# Patient Record
Sex: Female | Born: 1935 | Race: White | Hispanic: No | Smoking: Former smoker
Health system: Southern US, Community
[De-identification: ages and names within clinical notes are randomized; demographics above are authoritative.]

## PROBLEM LIST (undated history)

## (undated) DIAGNOSIS — Z923 Personal history of irradiation: Secondary | ICD-10-CM

## (undated) DIAGNOSIS — Z9981 Dependence on supplemental oxygen: Secondary | ICD-10-CM

## (undated) DIAGNOSIS — K219 Gastro-esophageal reflux disease without esophagitis: Secondary | ICD-10-CM

## (undated) DIAGNOSIS — I2510 Atherosclerotic heart disease of native coronary artery without angina pectoris: Secondary | ICD-10-CM

## (undated) DIAGNOSIS — H9190 Unspecified hearing loss, unspecified ear: Secondary | ICD-10-CM

## (undated) DIAGNOSIS — H538 Other visual disturbances: Secondary | ICD-10-CM

## (undated) DIAGNOSIS — J189 Pneumonia, unspecified organism: Secondary | ICD-10-CM

## (undated) DIAGNOSIS — G8929 Other chronic pain: Secondary | ICD-10-CM

## (undated) DIAGNOSIS — R5383 Other fatigue: Secondary | ICD-10-CM

## (undated) DIAGNOSIS — I214 Non-ST elevation (NSTEMI) myocardial infarction: Secondary | ICD-10-CM

## (undated) DIAGNOSIS — M545 Low back pain, unspecified: Secondary | ICD-10-CM

## (undated) DIAGNOSIS — C44321 Squamous cell carcinoma of skin of nose: Secondary | ICD-10-CM

## (undated) DIAGNOSIS — I10 Essential (primary) hypertension: Secondary | ICD-10-CM

## (undated) DIAGNOSIS — M1990 Unspecified osteoarthritis, unspecified site: Secondary | ICD-10-CM

## (undated) DIAGNOSIS — E785 Hyperlipidemia, unspecified: Secondary | ICD-10-CM

## (undated) DIAGNOSIS — J449 Chronic obstructive pulmonary disease, unspecified: Secondary | ICD-10-CM

## (undated) HISTORY — PX: BACK SURGERY: SHX140

## (undated) HISTORY — DX: Gastro-esophageal reflux disease without esophagitis: K21.9

## (undated) HISTORY — PX: ESOPHAGEAL DILATION: SHX303

## (undated) HISTORY — PX: DILATION AND CURETTAGE OF UTERUS: SHX78

## (undated) HISTORY — PX: CORONARY ANGIOPLASTY WITH STENT PLACEMENT: SHX49

## (undated) HISTORY — PX: TUBAL LIGATION: SHX77

## (undated) HISTORY — PX: ABDOMINAL HYSTERECTOMY: SHX81

## (undated) HISTORY — DX: Atherosclerotic heart disease of native coronary artery without angina pectoris: I25.10

## (undated) HISTORY — DX: Hyperlipidemia, unspecified: E78.5

## (undated) HISTORY — DX: Personal history of irradiation: Z92.3

---

## 1999-05-04 ENCOUNTER — Inpatient Hospital Stay (HOSPITAL_COMMUNITY): Admission: EM | Admit: 1999-05-04 | Discharge: 1999-05-08 | Payer: Self-pay | Admitting: Emergency Medicine

## 1999-05-04 ENCOUNTER — Other Ambulatory Visit: Admission: RE | Admit: 1999-05-04 | Discharge: 1999-05-04 | Payer: Self-pay | Admitting: Family Medicine

## 1999-05-04 ENCOUNTER — Encounter: Payer: Self-pay | Admitting: Emergency Medicine

## 1999-06-17 ENCOUNTER — Encounter: Payer: Self-pay | Admitting: Emergency Medicine

## 1999-06-17 ENCOUNTER — Inpatient Hospital Stay (HOSPITAL_COMMUNITY): Admission: EM | Admit: 1999-06-17 | Discharge: 1999-06-19 | Payer: Self-pay

## 2000-07-11 ENCOUNTER — Ambulatory Visit (HOSPITAL_COMMUNITY): Admission: RE | Admit: 2000-07-11 | Discharge: 2000-07-11 | Payer: Self-pay | Admitting: Cardiology

## 2000-08-29 ENCOUNTER — Ambulatory Visit (HOSPITAL_COMMUNITY): Admission: RE | Admit: 2000-08-29 | Discharge: 2000-08-29 | Payer: Self-pay | Admitting: Gastroenterology

## 2000-09-19 ENCOUNTER — Ambulatory Visit (HOSPITAL_COMMUNITY): Admission: RE | Admit: 2000-09-19 | Discharge: 2000-09-19 | Payer: Self-pay | Admitting: Gastroenterology

## 2000-09-19 ENCOUNTER — Encounter (INDEPENDENT_AMBULATORY_CARE_PROVIDER_SITE_OTHER): Payer: Self-pay | Admitting: *Deleted

## 2000-12-09 ENCOUNTER — Ambulatory Visit (HOSPITAL_COMMUNITY): Admission: RE | Admit: 2000-12-09 | Discharge: 2000-12-09 | Payer: Self-pay | Admitting: Cardiology

## 2003-11-02 ENCOUNTER — Emergency Department (HOSPITAL_COMMUNITY): Admission: EM | Admit: 2003-11-02 | Discharge: 2003-11-02 | Payer: Self-pay | Admitting: Emergency Medicine

## 2004-10-10 ENCOUNTER — Ambulatory Visit (HOSPITAL_COMMUNITY): Admission: RE | Admit: 2004-10-10 | Discharge: 2004-10-11 | Payer: Self-pay | Admitting: Cardiology

## 2004-10-13 ENCOUNTER — Inpatient Hospital Stay (HOSPITAL_COMMUNITY): Admission: EM | Admit: 2004-10-13 | Discharge: 2004-10-17 | Payer: Self-pay | Admitting: Emergency Medicine

## 2004-10-27 ENCOUNTER — Emergency Department (HOSPITAL_COMMUNITY): Admission: EM | Admit: 2004-10-27 | Discharge: 2004-10-27 | Payer: Self-pay | Admitting: Emergency Medicine

## 2004-10-31 ENCOUNTER — Encounter: Admission: RE | Admit: 2004-10-31 | Discharge: 2004-10-31 | Payer: Self-pay | Admitting: Cardiology

## 2005-01-01 ENCOUNTER — Ambulatory Visit (HOSPITAL_COMMUNITY): Admission: RE | Admit: 2005-01-01 | Discharge: 2005-01-01 | Payer: Self-pay | Admitting: Gastroenterology

## 2005-01-01 ENCOUNTER — Encounter (INDEPENDENT_AMBULATORY_CARE_PROVIDER_SITE_OTHER): Payer: Self-pay | Admitting: *Deleted

## 2005-02-06 ENCOUNTER — Encounter: Admission: RE | Admit: 2005-02-06 | Discharge: 2005-02-06 | Payer: Self-pay | Admitting: Gastroenterology

## 2005-03-28 ENCOUNTER — Inpatient Hospital Stay (HOSPITAL_COMMUNITY): Admission: EM | Admit: 2005-03-28 | Discharge: 2005-03-29 | Payer: Self-pay | Admitting: Emergency Medicine

## 2007-11-22 ENCOUNTER — Emergency Department (HOSPITAL_COMMUNITY): Admission: EM | Admit: 2007-11-22 | Discharge: 2007-11-22 | Payer: Self-pay | Admitting: Emergency Medicine

## 2007-11-24 ENCOUNTER — Inpatient Hospital Stay (HOSPITAL_COMMUNITY): Admission: EM | Admit: 2007-11-24 | Discharge: 2007-11-29 | Payer: Self-pay | Admitting: Emergency Medicine

## 2009-09-21 ENCOUNTER — Encounter: Admission: RE | Admit: 2009-09-21 | Discharge: 2009-09-21 | Payer: Self-pay | Admitting: Family Medicine

## 2010-05-24 ENCOUNTER — Ambulatory Visit: Payer: Self-pay | Admitting: Vascular Surgery

## 2010-08-15 ENCOUNTER — Encounter: Payer: Self-pay | Admitting: Gastroenterology

## 2010-08-16 ENCOUNTER — Encounter: Payer: Self-pay | Admitting: Gastroenterology

## 2010-08-28 ENCOUNTER — Ambulatory Visit: Payer: Self-pay | Admitting: Gastroenterology

## 2010-08-28 DIAGNOSIS — K6289 Other specified diseases of anus and rectum: Secondary | ICD-10-CM

## 2010-08-28 DIAGNOSIS — R110 Nausea: Secondary | ICD-10-CM

## 2010-08-28 DIAGNOSIS — I2510 Atherosclerotic heart disease of native coronary artery without angina pectoris: Secondary | ICD-10-CM

## 2010-08-30 ENCOUNTER — Telehealth: Payer: Self-pay | Admitting: Gastroenterology

## 2010-09-02 ENCOUNTER — Telehealth: Payer: Self-pay | Admitting: Internal Medicine

## 2010-09-04 ENCOUNTER — Telehealth: Payer: Self-pay | Admitting: Gastroenterology

## 2010-09-04 ENCOUNTER — Ambulatory Visit (HOSPITAL_COMMUNITY)
Admission: RE | Admit: 2010-09-04 | Discharge: 2010-09-04 | Payer: Self-pay | Source: Home / Self Care | Admitting: Gastroenterology

## 2010-09-05 ENCOUNTER — Ambulatory Visit: Payer: Self-pay | Admitting: Gastroenterology

## 2010-09-05 LAB — CONVERTED CEMR LAB
ALT: 14 units/L (ref 0–35)
AST: 16 units/L (ref 0–37)
Albumin: 4.2 g/dL (ref 3.5–5.2)
Alkaline Phosphatase: 48 units/L (ref 39–117)
BUN: 20 mg/dL (ref 6–23)
Basophils Absolute: 0.1 10*3/uL (ref 0.0–0.1)
Basophils Relative: 0.3 % (ref 0.0–3.0)
CO2: 31 meq/L (ref 19–32)
Calcium: 10.2 mg/dL (ref 8.4–10.5)
Chloride: 102 meq/L (ref 96–112)
Creatinine, Ser: 1.2 mg/dL (ref 0.4–1.2)
Eosinophils Absolute: 0 10*3/uL (ref 0.0–0.7)
Eosinophils Relative: 0.2 % (ref 0.0–5.0)
GFR calc non Af Amer: 45.83 mL/min (ref >60)
Glucose, Bld: 89 mg/dL (ref 70–99)
HCT: 43.5 % (ref 36.0–46.0)
Hemoglobin: 15.1 g/dL — ABNORMAL HIGH (ref 12.0–15.0)
INR: 1 (ref 0.8–1.0)
Lymphocytes Relative: 15.4 % (ref 12.0–46.0)
Lymphs Abs: 2.6 10*3/uL (ref 0.7–4.0)
MCHC: 34.8 g/dL (ref 30.0–36.0)
MCV: 94.2 fL (ref 78.0–100.0)
Monocytes Absolute: 1.4 10*3/uL — ABNORMAL HIGH (ref 0.1–1.0)
Monocytes Relative: 8.1 % (ref 3.0–12.0)
Neutro Abs: 12.9 10*3/uL — ABNORMAL HIGH (ref 1.4–7.7)
Neutrophils Relative %: 76 % (ref 43.0–77.0)
Platelets: 230 10*3/uL (ref 150.0–400.0)
Potassium: 3.8 meq/L (ref 3.5–5.1)
Prothrombin Time: 10.8 s (ref 9.7–11.8)
RBC: 4.61 M/uL (ref 3.87–5.11)
RDW: 13.2 % (ref 11.5–14.6)
Sodium: 142 meq/L (ref 135–145)
Total Bilirubin: 0.6 mg/dL (ref 0.3–1.2)
Total Protein: 6.7 g/dL (ref 6.0–8.3)
WBC: 17 10*3/uL — ABNORMAL HIGH (ref 4.5–10.5)

## 2010-09-07 ENCOUNTER — Encounter: Payer: Self-pay | Admitting: Gastroenterology

## 2010-09-14 ENCOUNTER — Telehealth: Payer: Self-pay | Admitting: Gastroenterology

## 2010-10-02 ENCOUNTER — Ambulatory Visit (HOSPITAL_COMMUNITY): Admission: RE | Admit: 2010-10-02 | Discharge: 2010-10-02 | Payer: Self-pay | Admitting: Family Medicine

## 2010-10-06 ENCOUNTER — Ambulatory Visit: Payer: Self-pay | Admitting: Cardiovascular Disease

## 2010-10-06 ENCOUNTER — Inpatient Hospital Stay (HOSPITAL_COMMUNITY)
Admission: EM | Admit: 2010-10-06 | Discharge: 2010-10-09 | Payer: Self-pay | Source: Home / Self Care | Admitting: Emergency Medicine

## 2010-10-06 ENCOUNTER — Emergency Department (HOSPITAL_COMMUNITY): Admission: EM | Admit: 2010-10-06 | Discharge: 2010-10-06 | Payer: Self-pay | Admitting: Family Medicine

## 2010-10-19 ENCOUNTER — Ambulatory Visit: Payer: Self-pay | Admitting: Gastroenterology

## 2010-12-09 ENCOUNTER — Encounter: Payer: Self-pay | Admitting: Gastroenterology

## 2010-12-16 LAB — CONVERTED CEMR LAB
BUN: 17 mg/dL (ref 6–23)
Creatinine, Ser: 1.4 mg/dL — ABNORMAL HIGH (ref 0.4–1.2)

## 2010-12-20 NOTE — Assessment & Plan Note (Signed)
Summary: PROCEDURE F/U.Marland KitchenMarland KitchenAS.   History of Present Illness Visit Type: Follow-up Visit Primary GI MD: Melvia Heaps MD [facility] Primary Monzerrat Wellen: Marylin Crosby, MD Chief Complaint: Procedure follow up, pt complains of abdominal discomfort and diarrhea History of Present Illness:   Ms. Lovins has returned following colonoscopy.  This did not demonstrate any abnormalities.  She was prescribed clinoril but did not take this because of GI upset.  She continues to complain of lower back pain with radiation to the hips.  She also has rectal pain and is not affected by moving her bowels.  She received a steroid injection into her back without improvement.   GI Review of Systems    Reports abdominal pain and  bloating.     Location of  Abdominal pain: lower abdomen.    Denies acid reflux, belching, chest pain, dysphagia with liquids, dysphagia with solids, heartburn, loss of appetite, nausea, vomiting, vomiting blood, weight loss, and  weight gain.      Reports diarrhea and  rectal pain.     Denies anal fissure, black tarry stools, change in bowel habit, constipation, diverticulosis, fecal incontinence, heme positive stool, hemorrhoids, irritable bowel syndrome, jaundice, light color stool, liver problems, and  rectal bleeding.    Current Medications (verified): 1)  Calcium 600 600 Mg Tabs (Calcium Carbonate) .... Take 1 Tablet By Mouth Once A Day 2)  Bayer Low Strength 81 Mg Tbec (Aspirin) .... Take 1 Tablet By Mouth Once A Day 3)  Zantac 300 Mg Tabs (Ranitidine Hcl) .... Take 1 Tablet By Mouth Once A Day 4)  Pravachol 40 Mg Tabs (Pravastatin Sodium) .... Take 1 Tablet By Mouth Once A Day 5)  Hydrochlorothiazide 25 Mg Tabs (Hydrochlorothiazide) .... Take 1 Tablet By Mouth Once A Day 6)  Atenolol 50 Mg Tabs (Atenolol) .... Take 1/2 Tablet By Mouth Once Daily 7)  Isosorbide Dinitrate 20 Mg Tabs (Isosorbide Dinitrate) .... Take 2 Tablets By Mouth Once Daily 8)  Vitamin D 1000 Unit Tabs  (Cholecalciferol) .Marland Kitchen.. 1 By Mouth Once Daily 9)  Tramadol Hcl 50 Mg Tabs (Tramadol Hcl) .... As Needed 10)  Proventil Hfa 108 (90 Base) Mcg/act Aers (Albuterol Sulfate) .... 2 Puffs Every 4 Hours 11)  Advair Diskus 250-50 Mcg/dose Aepb (Fluticasone-Salmeterol) .... Two Times A Day As Needed  Allergies (verified): 1)  ! * Ivp Dye  Past History:  Past Medical History: Last updated: 08/28/2010 Hypertension Hyperlipidemia GERD Rectal Pain CAD  Past Surgical History: Last updated: 08/28/2010 Cardiac Stent Placement Hysterectomy  Family History: Last updated: 08/28/2010 No FH of Colon Cancer: Family History of Heart Disease: Father  Social History: Last updated: 08/28/2010 Patient currently smokes. -smokes 9 cigarettes daily Alcohol Use - no Daily Caffeine Use-2 cups daily Illicit Drug Use - no Patient does not get regular exercise.   Review of Systems       The patient complains of fatigue.  The patient denies allergy/sinus, anemia, anxiety-new, arthritis/joint pain, back pain, blood in urine, breast changes/lumps, change in vision, confusion, cough, coughing up blood, depression-new, fainting, fever, headaches-new, hearing problems, heart murmur, heart rhythm changes, itching, menstrual pain, muscle pains/cramps, night sweats, nosebleeds, pregnancy symptoms, shortness of breath, skin rash, sleeping problems, sore throat, swelling of feet/legs, swollen lymph glands, thirst - excessive , urination - excessive , urination changes/pain, urine leakage, vision changes, and voice change.    Vital Signs:  Patient profile:   76 year old female Height:      65 inches Weight:  164 pounds BMI:     27.39 BSA:     1.82 Pulse rate:   88 / minute Pulse rhythm:   regular BP sitting:   126 / 64  (left arm)  Vitals Entered By: Merri Ray CMA Duncan Dull) (October 19, 2010 2:09 PM)   Impression & Recommendations:  Problem # 1:  ANAL OR RECTAL PAIN (WGN-562.13) I still suspect  that her pain is primarily due to nerve root pain.  Perirectal disease is less likely.  Recommendations #1 trial of Celebrex 100 mg a day( patient did not tolerate pleural) #2 Anusol HC suppositories  Patient Instructions: 1)  Copy sent to : Marylin Crosby, MD 2)  Your medications have been sent to your pharmacy 3)  The medication list was reviewed and reconciled.  All changed / newly prescribed medications were explained.  A complete medication list was provided to the patient / caregiver. Prescriptions: ANUSOL-HC 25 MG  SUPP (HYDROCORTISONE ACETATE) take one suppository q.h.s.  #10 x 1   Entered and Authorized by:   Louis Meckel MD   Signed by:   Louis Meckel MD on 10/19/2010   Method used:   Electronically to        [facility] (retail)       [address]       [place], [place]  [address]       Ph: [phone]       Fax: [phone]   RxID:   [id number] CELEBREX 100 MG CAPS (CELECOXIB) date one tab daily for 2 weeks then as needed  #20 x 1   Entered and Authorized by:   Louis Meckel MD   Signed by:   Louis Meckel MD on 10/19/2010   Method used:   Electronically to        [facility] (retail)       [address]       [place], [place]  [address]       Ph: [phone]       Fax: [phone]   RxID:   [id number]

## 2010-12-20 NOTE — Progress Notes (Signed)
Summary: Triage  Phone Note Call from Patient Call back at Home Phone [phone]   Caller: Patient Call For: Dr. Arlyce Dice Reason for Call: Talk to Nurse Summary of Call: Since her COL she has had a headache. Took Tylenol and nothing is helping Initial call taken by: Karna Christmas,  September 14, 2010 11:34 AM  Follow-up for Phone Call        Patient  is advised she should see her priamry care for her HA.  Unlikely to be related to a colonoscopy 09/05/10 Follow-up by: Darcey Nora RN, CGRN,  September 14, 2010 1:21 PM

## 2010-12-20 NOTE — Procedures (Signed)
Summary: Colonoscopy  Patient: [name] Note: All result statuses are Final unless otherwise noted.  Tests: (1) Colonoscopy (COL)   COL Colonoscopy           DONE     [facility]     [address]     [place], [place]  [address]           COLONOSCOPY PROCEDURE REPORT           PATIENT:  [name], [name]  MR#:  [id number]     BIRTHDATE:  11-30-1934, 75 yrs. old  GENDER:  female           ENDOSCOPIST:  Barbette Hair. Arlyce Dice, MD     Referred by:           PROCEDURE DATE:  09/05/2010     PROCEDURE:  Colonoscopy with biopsy     ASA CLASS:  Class II     INDICATIONS:  1) other Severe rectal pain           MEDICATIONS:   Fentanyl 75 mcg IV, Versed 7 mg IV           DESCRIPTION OF PROCEDURE:   After the risks benefits and     alternatives of the procedure were thoroughly explained, informed     consent was obtained.  Digital rectal exam was performed and     revealed no abnormalities.   The LB [id number] endoscope     was introduced through the anus and advanced to the cecum, which     was identified by both the appendix and ileocecal valve, without     limitations.  The quality of the prep was good, using MiraLax.     The instrument was then slowly withdrawn as the colon was fully     examined.     <<PROCEDUREIMAGES>>           FINDINGS:  Colitis was found. Multiple areas of submucosal     erythema extending from proximal sigmoid to descending colon. Bxs     taken (see image10, image13, and image7).  Moderate diverticulosis     was found in the sigmoid colon (see image15 and image14).  This     was otherwise a normal examination of the colon (see image3,     image6, image11, and image16).   Retroflexed views in the rectum     revealed no abnormalities.    The time to cecum =  4.75  minutes.     The scope was then withdrawn (time =  6.75  min) from the patient     and the procedure completed.           COMPLICATIONS:  None           ENDOSCOPIC IMPRESSION:     1)  Colitis     2) Moderate diverticulosis in the sigmoid colon     3) Otherwise normal examination     RECOMMENDATIONS:     1) Await biopsy results     2) call office next 1-3 days to schedule followup visit in 2     weeks           REPEAT EXAM:  No           ______________________________     Barbette Hair. Arlyce Dice, MD           CC: Windle Guard, MD           n.  eSIGNED:   Barbette Hair. [place] at 09/05/2010 09:16 AM           Lucilla Edin, [id number]  Note: An exclamation mark (!) indicates a result that was not dispersed into the flowsheet. Document Creation Date: 09/05/2010 9:16 AM _______________________________________________________________________  (1) Order result status: Final Collection or observation date-time: 09/05/2010 09:05 Requested date-time:  Receipt date-time:  Reported date-time:  Referring Physician:   Ordering Physician: Melvia Heaps [phone]) Specimen Source:  Source: Launa Grill Order Number: [id number] Lab site:

## 2010-12-20 NOTE — Letter (Signed)
Summary: [facility] Instructions  [facility]  [address] [place], [place] [address]   Phone: [phone]  Fax: [phone]       [name]    [birth year]    MRN: [id number]        Procedure Day /Date:WEDNESDAY 09/05/2010     Arrival Time:7:30AM     Procedure Time:8:30AM     Location of Procedure:                    X   [facility] (4th Floor)                        PREPARATION FOR COLONOSCOPY WITH MOVIPREP   Starting 5 days prior to your procedure 08/31/2010 do not eat nuts, seeds, popcorn, corn, beans, peas,  salads, or any raw vegetables.  Do not take any fiber supplements (e.g. Metamucil, Citrucel, and Benefiber).  THE DAY BEFORE YOUR PROCEDURE         DATE: 09/04/2010  DAY: TUESDAY  1.  Drink clear liquids the entire day-NO SOLID FOOD  2.  Do not drink anything colored red or purple.  Avoid juices with pulp.  No orange juice.  3.  Drink at least 64 oz. (8 glasses) of fluid/clear liquids during the day to prevent dehydration and help the prep work efficiently.  CLEAR LIQUIDS INCLUDE: Water Jello Ice Popsicles Tea (sugar ok, no milk/cream) Powdered fruit flavored drinks Coffee (sugar ok, no milk/cream) Gatorade Juice: apple, white grape, white cranberry  Lemonade Clear bullion, consomm, broth Carbonated beverages (any kind) Strained chicken noodle soup Hard Candy                             4.  In the morning, mix first dose of MoviPrep solution:    Empty 1 Pouch A and 1 Pouch B into the disposable container    Add lukewarm drinking water to the top line of the container. Mix to dissolve    Refrigerate (mixed solution should be used within 24 hrs)  5.  Begin drinking the prep at 5:00 p.m. The MoviPrep container is divided by 4 marks.   Every 15 minutes drink the solution down to the next mark (approximately 8 oz) until the full liter is complete.   6.  Follow completed prep with 16 oz of clear liquid of your choice  (Nothing red or purple).  Continue to drink clear liquids until bedtime.  7.  Before going to bed, mix second dose of MoviPrep solution:    Empty 1 Pouch A and 1 Pouch B into the disposable container    Add lukewarm drinking water to the top line of the container. Mix to dissolve    Refrigerate  THE DAY OF YOUR PROCEDURE      DATE: 09/05/2010 DAY: WEDNESDAY  Beginning at3:30a.m. (5 hours before procedure):         1. Every 15 minutes, drink the solution down to the next mark (approx 8 oz) until the full liter is complete.  2. Follow completed prep with 16 oz. of clear liquid of your choice.    3. You may drink clear liquids until 6:30AM (2 HOURS BEFORE PROCEDURE).   MEDICATION INSTRUCTIONS  Unless otherwise instructed, you should take regular prescription medications with a small sip of water   as early as possible the morning of your procedure.  OTHER INSTRUCTIONS  You will need a responsible adult at least 74 years of age to accompany you and drive you home.   This person must remain in the waiting room during your procedure.  Wear loose fitting clothing that is easily removed.  Leave jewelry and other valuables at home.  However, you may wish to bring a book to read or  an iPod/MP3 player to listen to music as you wait for your procedure to start.  Remove all body piercing jewelry and leave at home.  Total time from sign-in until discharge is approximately 2-3 hours.  You should go home directly after your procedure and rest.  You can resume normal activities the  day after your procedure.  The day of your procedure you should not:   Drive   Make legal decisions   Operate machinery   Drink alcohol   Return to work  You will receive specific instructions about eating, activities and medications before you leave.    The above instructions have been reviewed and explained to me by   _______________________    I fully understand and can  verbalize these instructions _____________________________ Date _________

## 2010-12-20 NOTE — Progress Notes (Signed)
  Phone Note Call from Patient   Caller: patient and daughter Call For: Dr Barnie Alderman "makes her sick", unablw to take. Summary of Call: Pt says she is not able to take Clinoril ( Sulindac), because it makes her sick. I advised her to discontinue the medication.

## 2010-12-20 NOTE — Letter (Signed)
Summary: Results Letter  [facility]  [address] [place], [place] [address]   Phone: [phone]  Fax: [phone]        August 28, 2010 MRN: [id number]    [name] [address] [place], [place]  [address]    Dear [name],  It is my pleasure to have treated you recently as a new patient in my office. I appreciate your confidence and the opportunity to participate in your care.  Since I do have a busy inpatient endoscopy schedule and office schedule, my office hours vary weekly. I am, however, available for emergency calls everyday through my office. If I am not available for an urgent office appointment, another one of our gastroenterologist will be able to assist you.  My well-trained staff are prepared to help you at all times. For emergencies after office hours, a physician from our Gastroenterology section is always available through my 24 hour answering service  Once again I welcome you as a new patient and I look forward to a happy and healthy relationship             Sincerely,  Louis Meckel MD  This letter has been electronically signed by your physician.  Appended Document: Results Letter LETTER MAILED

## 2010-12-20 NOTE — Progress Notes (Signed)
Summary: Schedule labs before colonoscopy  ---- Converted from flag ---- ---- 09/04/2010 2:32 PM, Louis Meckel MD wrote: [name] patiet comes i for her colooscopy she should have a CBC, comprehesive metaolic profile ad INR FOR POSSIBLE ABNORMAL CONTOUR OF THE LIVER SEEN ON CT sca ------------------------------  Phone Note Outgoing Call Call back at [facility] Phone [phone]   Call placed by: Chales Abrahams CMA Duncan Dull),  September 04, 2010 3:03 PM Summary of Call: call placed to the pt and she will arrive at 730 for labs before her colon. Initial call taken by: Chales Abrahams CMA Duncan Dull),  September 04, 2010 3:03 PM     Appended Document: Schedule labs before colonoscopy ok  ---- 09/04/2010 3:04 PM, Merri Ray CMA (AAMA) wrote: Her appointment is at 7:30 to arrive for her colonoscopy...she will go to the lab first

## 2010-12-20 NOTE — Assessment & Plan Note (Signed)
Summary: rectal pain with BM...as.   History of Present Illness Visit Type: Initial Consult Primary GI MD: Melvia Heaps MD [facility] Primary Provider: Marylin Crosby, MD Chief Complaint: Patient c/o severe rectal pain as well as constipation. She has also began having nausea today which she believes is a result of her pain. She also describes lower abdominal cramping and abdominal bloating. Also c/o fever. History of Present Illness:   [name] is a 76 year old white female referred at the request of Dr. Jeannetta Nap for evaluation of rectal pain.  She initially developed severe pain in her lower back radiating to her right lower extremity and hip.  This was concurrent with rectal pain.  She received some injections into her back for pain.  This has subsided to a great degree.  She continues to have severe rectal pain.  It tends to worsen with prolonged standing.  It is not clearly exacerbated by  bowel movements.  She has had no rectal bleeding or change in bowel habits.  She complains of mild constipation.  She has also had some discomfort in the vagina.  She has had no vaginal discharge.  She denies fever.  The patient also complains of mild nausea postprandially but she attributes to her pain.  She has been taking, dull pain.   GI Review of Systems    Reports abdominal pain, acid reflux, bloating, chest pain, loss of appetite, and  nausea.     Location of  Abdominal pain: lower abdomen.    Denies belching, dysphagia with liquids, dysphagia with solids, heartburn, vomiting, vomiting blood, weight loss, and  weight gain.      Reports constipation and  rectal pain.    Preventive Screening-Counseling & Management  Alcohol-Tobacco     Smoking Status: current  Caffeine-Diet-Exercise     Does Patient Exercise: no      Drug Use:  no.      Current Medications (verified): 1)  Calcium 600 600 Mg Tabs (Calcium Carbonate) .... Take 1 Tablet By Mouth Once A Day 2)  Bayer Low Strength 81 Mg Tbec  (Aspirin) .... Take 1 Tablet By Mouth Once A Day 3)  Zantac 300 Mg Tabs (Ranitidine Hcl) .... Take 1 Tablet By Mouth Once A Day 4)  Pravachol 40 Mg Tabs (Pravastatin Sodium) .... Take 1 Tablet By Mouth Once A Day 5)  Hydrochlorothiazide 25 Mg Tabs (Hydrochlorothiazide) .... Take 1 Tablet By Mouth Once A Day 6)  Atenolol 50 Mg Tabs (Atenolol) .... Take 1/2 Tablet By Mouth Once Daily 7)  Isosorbide Dinitrate 20 Mg Tabs (Isosorbide Dinitrate) .... Take 2 Tablets By Mouth Once Daily 8)  Tramadol Hcl 50 Mg Tabs (Tramadol Hcl) .... Take 1-2 Tablet By Mouth Every 4 Hours As Needed  Allergies (verified): 1)  ! * Ivp Dye  Past History:  Past Medical History: Hypertension Hyperlipidemia GERD Rectal Pain CAD  Past Surgical History: Cardiac Stent Placement Hysterectomy  Family History: No FH of Colon Cancer: Family History of Heart Disease: Father  Social History: Patient currently smokes. -smokes 9 cigarettes daily Alcohol Use - no Daily Caffeine Use-2 cups daily Illicit Drug Use - no Patient does not get regular exercise.  Smoking Status:  current Drug Use:  no Does Patient Exercise:  no  Review of Systems       The patient complains of fever, shortness of breath, and sleeping problems.  The patient denies allergy/sinus, anemia, anxiety-new, arthritis/joint pain, back pain, blood in urine, breast changes/lumps, change in vision, confusion, cough, coughing  up blood, depression-new, fainting, fatigue, headaches-new, hearing problems, heart murmur, heart rhythm changes, itching, menstrual pain, muscle pains/cramps, night sweats, nosebleeds, pregnancy symptoms, skin rash, sore throat, swelling of feet/legs, swollen lymph glands, thirst - excessive , urination - excessive , urination changes/pain, urine leakage, vision changes, and voice change.         All other systems were reviewed and were negative   Vital Signs:  Patient profile:   76 year old female Height:      65  inches Weight:      166.25 pounds BMI:     27.77 BSA:     1.83 Temp:     97.6 degrees F oral Pulse rate:   60 / minute Pulse rhythm:   regular BP sitting:   103 / 62  (left arm)  Vitals Entered By: [name] CMA Duncan Dull) (August 28, 2010 1:20 PM)  Physical Exam  Additional Exam:  Onn physical exam she is a well-developed well-nourished female  skin: anicter  HEENT: normocephalic; PEERLA; no nasal or orpharyngeal abnormalities neck: supple nodes: no cervical adenopathy chest: clear cor:  no murmurs, gallops or rubs abd:  bowel sounds normoactive; no abdominal masses, tenderness, organomegaly rectal: no masses; stool heme negative; there is some moderate pain on rectal examination throughout the rectum and anus with no spot tenderness.  There no obvious fissures. ext: no cyanosis, clubbing, or edema skeletal: no gross skeletal abnormalities neuro: alert, oriented x 3; no focal abnormalities    Impression & Recommendations:  Problem # 1:  ANAL OR RECTAL PAIN (ICD-569.42) There is no obvious rectal pathology including abscess, fissure or masses to account for her pain.  Nerve root pain is a consideration, especially in view of her concurrent low back pain with radiation to her hip and right lower extremity.  She apparently has vaginal pain as well.  A space-occupying lesion in the pelvis is another consideration.  Recommendations #1 colonoscopy #2 CT of the abdomen and pelvis #3 trial of clinical 200 mg twice a day  Problem # 2:  NAUSEA ALONE (ICD-787.02) This could be related to tramadol  Recommendations #1 discontinue tramadol and substitute Clinoril  Problem # 3:  CAD (ICD-414.00)  Other Orders: Colonoscopy (Colon) CT Abdomen/Pelvis with Contrast (CT Abd/Pelvis w/con) TLB-BUN (Urea Nitrogen) (84520-BUN) TLB-Creatinine, Blood (82565-CREA)  Patient Instructions: 1)  Copy sent to : Marylin Crosby, MD 2)  D/C Tramadol 3)  We are sending you in a new  prescription 4)  You CT scan is scheduled at [facility] on 08/30/2010 to arrive at 2:15pm 5)  You need to go to [facility] department today to pick up your allergy prescription for your CT scan also your contrast. 6)  Your colonoscopy is scheduled for 09/05/2010 at 8:30am 7)  The medication list was reviewed and reconciled.  All changed / newly prescribed medications were explained.  A complete medication list was provided to the patient / caregiver. Prescriptions: MOVIPREP 100 GM  SOLR (PEG-KCL-NACL-NASULF-NA ASC-C) As per prep instructions.  #1 x 0   Entered by:   Merri Ray CMA (AAMA)   Authorized by:   Louis Meckel MD   Signed by:   Merri Ray CMA (AAMA) on 08/28/2010   Method used:   Electronically to        [facility] (retail)       [address]       [place], [place]  [address]  Ph: [phone]       Fax: [phone]   RxID:   [id number] CLINORIL 200 MG TABS (SULINDAC) take one tablet with meals twice a day  #30 x 2   Entered and Authorized by:   Louis Meckel MD   Signed by:   Merri Ray CMA (AAMA) on 08/28/2010   Method used:   Electronically to        [facility] (retail)       [address]       [place], [place]  [address]       Ph: [phone]       Fax: [phone]   RxID:   [id number]   Appended Document: rectal pain with BM...as. Please inform pt that there are no abnormalities on the CT scan to explain her rectal pain. (CT is negative). Will need CMET, INR, CBC at next OV (questionable contour abnormalities of liver)  Appended Document: rectal pain with BM...as. Will be done before colonoscopy procedure

## 2010-12-20 NOTE — Op Note (Signed)
Summary: EGD/COLON  [name], MCWETHY NO.:  [id number]   MEDICAL RECORD NO.:  [id number]          PATIENT TYPE:  AMB   LOCATION:  ENDO                         FACILITY:  [facility]   PHYSICIAN:  Danise Edge, M.D.   DATE OF BIRTH:  Apr 06, 1935   DATE OF PROCEDURE:  01/01/2005  DATE OF DISCHARGE:                                 OPERATIVE REPORT   PROCEDURE:  Esophagogastroduodenoscopy, small bowel biopsy, colonoscopy and  biopsy.   REFERRING PHYSICIAN:  Windle Guard, M.D.   HISTORY:  [name] is a 76 year old female born May 02, 1935.  [name] has chronic nonbloody diarrhea which started after taking a  course of methylprednisolone for gout. She has nocturnal diarrhea. She has  upper abdominal discomfort, but denies hematochezia.   A December 12, 2004 stool culture negative. Stool screen for C. Difficile  toxin negative. Stool screen for ova and parasites negative.   Dr. Windle Guard has carefully reviewed her chronic medications and despite  eliminating medications, her diarrhea persisted.   MEDICATION ALLERGIES:  None.   CHRONIC MEDICATIONS:  Bayer aspirin, Protonix,  ticlopidine Crestor,  hydrochlorothiazide atenolol, isosorbide.   PAST MEDICAL HISTORY:  Hysterectomy, coronary artery disease, hypertension,  hypercholesterolemia, gastroesophageal reflux, colonoscopy 4 years ago  performed by Dr. Ritta Slot.   ENDOSCOPIST:  Danise Edge, M.D.   PREMEDICATION:  Versed 5 mg, Demerol 50 mg.   PROCEDURE:  ESOPHAGOGASTRODUODENOSCOPY WITH SMALL BOWEL BIOPSIES:  After  obtaining informed consent, [name] was placed in the left lateral  decubitus position. I administered intravenous Demerol and intravenous  Versed to achieve conscious sedation for the procedure. The patient's blood  pressure, oxygen saturation and cardiac rhythm were monitored throughout the  procedure and documented in the medical record.   The Olympus gastroscope  was passed through the posterior hypopharynx at the  proximal esophagus without difficulty. The hypopharynx, larynx and vocal  cords appeared normal.   ESOPHAGOSCOPY: The proximal, mid, and lower segments of the esophageal  mucosa appeared normal. The squamocolumnar junction and esophagogastric  junction are noted at 40 cm from the incisor teeth.   GASTROSCOPY:  Retroflex view of the gastric cardia and fundus was normal.  The gastric body, antrum and pylorus appeared normal.   DUODENOSCOPY: The duodenal bulb, mid duodenum and distal duodenum appeared  normal.   BIOPSIES: Six biopsies were taken from the second - third portions of the  duodenum to look for celiac sprue.   ASSESSMENT:  Normal esophagogastroduodenoscopy. Small bowel biopsies, rule  out celiac sprue pending.   PROCEDURE:  DIAGNOSTIC COLONOSCOPY WITH COLONIC BIOPSIES.  Anal inspection  and digital rectal exam were normal. The Olympus adjustable pediatric  colonoscope was introduced into the rectum and advanced to the cecum.  Colonic preparation exam today was satisfactory.   RECTUM:  Normal.  SIGMOID COLON AND DESCENDING COLON:  Colonic diverticulosis.  SPLENIC FLEXURE:  Normal.  TRANSVERSE COLON:  Normal.  HEPATIC FLEXURE:  Normal.  ASCENDING COLON:  Normal.  CECUM AND ILEOCECAL VALVE:  Normal.   BIOPSIES:  Three biopsies were taken from the right  colon and two biopsies  were taken from the left colon to look for lymphocytic - collagenous  colitis.   ASSESSMENT:  Left colonic diverticulosis; otherwise normal proctocolonoscopy  to the cecum. Colonic biopsies, rule out lymphocytic - collagenous colitis  pending.      MJ/MEDQ  D:  01/01/2005  T:  01/01/2005  Job:  [id number]   cc:   Windle Guard, M.D.  [address]  [place], [place] [address]  Fax: [phone]

## 2010-12-20 NOTE — Progress Notes (Signed)
Summary: How much benadril to take?  Phone Note Call from Patient Call back at Home Phone [phone]   Call For: Dr Arlyce Dice Summary of Call: Unsure of the ammount of benadrill she needs to take before her CScan. They just gave her a box without any instructions Initial call taken by: Leanor Kail [facility],  August 30, 2010 9:23 AM  Follow-up for Phone Call        Pt did not start her prednisone prep 13 hours prior had to r/s her CT for 09/04/2010 at 12:45.Marland KitchenMarland KitchenPt is to contact technologist at [phone] for specific instructions Follow-up by: Merri Ray CMA Duncan Dull),  August 30, 2010 9:39 AM  Additional Follow-up for Phone Call Additional follow up Details #1::        Contacted pt she is aware that she needs to contact tech 1 day ahead of time for specific prep instructions on her prednisone and benadryl Additional Follow-up by: Merri Ray CMA Duncan Dull),  August 30, 2010 9:42 AM

## 2010-12-20 NOTE — Op Note (Signed)
Summary: COLONOSCOPY (MEDOFF)    [facility]  Patient:    [name], [name]                    MRN: [id number] Proc. Date: 09/19/00 Adm. Date:  [id number] Attending:  Deneen Harts CC:         Dr. Berline Chough   Procedure Report  PROCEDURE:  Colonoscopy.  INDICATIONS:  A 76 year old white female with complaints of abdominal pain, pressure-type discomfort in the mid abdomen, associated with altered bowel movements with thinning stool caliber, tenesmus, and increased straining. Denies hematochezia.  No prior colorectal neoplasia surveillance.  The patient underwent upper endoscopy on August 29, 2000, for similar complaints without abnormality noted.  DESCRIPTION OF PROCEDURE:  After reviewing the nature of the procedure with the patient, including potential risks and complications, and after discussing alternative methods of diagnosis and treatment, informed consent was signed.  The patient was premedicated, receiving IV sedation administered in divided doses totalling Versed 7.5 mg and fentanyl 75 mcg.  Using an Olympus pediatric PCF-140L colonoscope, the rectum was intubated after normal digital examination.  The scope could be advanced to the splenic flexure, but no beyond due to tortuosity of the colon.  After multiple attempts, including rotating the patient into a supine and right lateral decubitus position, it was elected to exchange the colonoscope for a standard size.  Reintubation was easily accomplished and the scope much more easily advanced around the entire length of the colon to the cecum.  This was identified by the appendiceal orifice and ileocecal valve.  Preparation was fair throughout. Copious irrigation required due to Visicol residue.  Excellent visualization with irrigation revealed no abnormality, except for mild sigmoid diverticulosis.  There was no evidence of neoplasia, mucosal inflammation, or vascular abnormality.  No lesion to explain  symptoms of abdominal pain or altered bowel movements.  The retroflexed view did not reveal any anorectal disease, specifically without hemorrhoids or fissure. The colon was then decompressed and scope withdrawn.  The patient tolerated the procedure well without difficulty, being maintained on Datascope monitor and low-flow oxygen throughout.  TIME:  Three.  TECHNICAL:  Two.  PREPARATION:  Two.  TOTAL SCORE:  Seven.  ASSESSMENT: 1. Diverticulosis, mild, sigmoid. 2. No colorectal neoplasia. 3. No lesion to explain symptoms of abdominal pain.  RECOMMENDATIONS: 1. Annual Hemoccult test per Dr. Berline Chough. 2. Repeat colonoscopy in 10 years. 3. Return office visit in one month to review symptoms, findings, and discuss    need for further evaluation.  Consider abdominal CT, repeat abdominal    ultrasound, and CCK-HIDA scan. DD:  09/19/00 TD:  09/21/00 Job: [id number]

## 2010-12-20 NOTE — Letter (Signed)
Summary: Results Letter  [facility]  [address] [place], [place] [address]   Phone: [phone]  Fax: [phone]        September 07, 2010 MRN: [id number]    [name] [address] [place], [place]  [address]    Dear Ms. Mersereau,\  Your colon biopsy results did not show any remarkable findings.  Please continue with the recommendations previously discussed.  Should you have any further questions or immediate concers, feel free to contact me.  Sincerely,  Barbette Hair. Arlyce Dice, M.D., [facility]          Sincerely,  Louis Meckel MD  This letter has been electronically signed by your physician.  Appended Document: Results Letter letter mailed

## 2011-01-29 LAB — COMPREHENSIVE METABOLIC PANEL
ALT: 14 U/L (ref 0–35)
ALT: 15 U/L (ref 0–35)
Albumin: 4.1 g/dL (ref 3.5–5.2)
Alkaline Phosphatase: 47 U/L (ref 39–117)
Alkaline Phosphatase: 47 U/L (ref 39–117)
CO2: 30 mEq/L (ref 19–32)
Calcium: 9.4 mg/dL (ref 8.4–10.5)
Chloride: 102 mEq/L (ref 96–112)
GFR calc non Af Amer: 46 mL/min — ABNORMAL LOW (ref >60)
Glucose, Bld: 103 mg/dL — ABNORMAL HIGH (ref 70–99)
Glucose, Bld: 97 mg/dL (ref 70–99)
Potassium: 3.8 mEq/L (ref 3.5–5.1)
Potassium: 3.9 mEq/L (ref 3.5–5.1)
Sodium: 137 mEq/L (ref 135–145)
Sodium: 138 mEq/L (ref 135–145)
Total Bilirubin: 0.5 mg/dL (ref 0.3–1.2)
Total Protein: 6.9 g/dL (ref 6.0–8.3)

## 2011-01-29 LAB — POCT URINALYSIS DIPSTICK
Glucose, UA: NEGATIVE mg/dL
Ketones, ur: NEGATIVE mg/dL
Protein, ur: NEGATIVE mg/dL

## 2011-01-29 LAB — POCT I-STAT, CHEM 8
BUN: 10 mg/dL (ref 6–23)
Calcium, Ion: 1.14 mmol/L (ref 1.12–1.32)
Creatinine, Ser: 1.2 mg/dL (ref 0.4–1.2)
TCO2: 27 mmol/L (ref 0–100)

## 2011-01-29 LAB — BASIC METABOLIC PANEL
CO2: 28 mEq/L (ref 19–32)
Chloride: 102 mEq/L (ref 96–112)
Chloride: 104 mEq/L (ref 96–112)
GFR calc Af Amer: 57 mL/min — ABNORMAL LOW (ref >60)
GFR calc Af Amer: >60 mL/min (ref >60)
GFR calc non Af Amer: 51 mL/min — ABNORMAL LOW (ref >60)
Potassium: 4.1 mEq/L (ref 3.5–5.1)
Sodium: 139 mEq/L (ref 135–145)
Sodium: 140 mEq/L (ref 135–145)

## 2011-01-29 LAB — CBC
HCT: 43.3 % (ref 36.0–46.0)
HCT: 45.3 % (ref 36.0–46.0)
Hemoglobin: 14.6 g/dL (ref 12.0–15.0)
MCHC: 33.7 g/dL (ref 30.0–36.0)
Platelets: 229 10*3/uL (ref 150–400)
RBC: 4.59 MIL/uL (ref 3.87–5.11)
RBC: 4.83 MIL/uL (ref 3.87–5.11)
RDW: 12.5 % (ref 11.5–15.5)
WBC: 11.1 10*3/uL — ABNORMAL HIGH (ref 4.0–10.5)

## 2011-01-29 LAB — URINALYSIS, ROUTINE W REFLEX MICROSCOPIC
Bilirubin Urine: NEGATIVE
Glucose, UA: NEGATIVE mg/dL
Ketones, ur: NEGATIVE mg/dL
pH: 7 (ref 5.0–8.0)

## 2011-01-29 LAB — CARDIAC PANEL(CRET KIN+CKTOT+MB+TROPI)
CK, MB: 1.9 ng/mL (ref 0.3–4.0)
Total CK: 95 U/L (ref 7–177)
Troponin I: 0.01 ng/mL (ref 0.00–0.06)

## 2011-01-29 LAB — HEMOGLOBIN A1C
Hgb A1c MFr Bld: 5.8 % — ABNORMAL HIGH (ref <5.7)
Mean Plasma Glucose: 120 mg/dL — ABNORMAL HIGH (ref <117)

## 2011-01-29 LAB — DIFFERENTIAL
Basophils Relative: 0 % (ref 0–1)
Eosinophils Absolute: 0 10*3/uL (ref 0.0–0.7)
Monocytes Absolute: 0.6 10*3/uL (ref 0.1–1.0)
Monocytes Relative: 5 % (ref 3–12)

## 2011-01-29 LAB — CK TOTAL AND CKMB (NOT AT ARMC)
Relative Index: INVALID (ref 0.0–2.5)
Total CK: 95 U/L (ref 7–177)

## 2011-02-27 ENCOUNTER — Telehealth: Payer: Self-pay | Admitting: Cardiology

## 2011-02-27 NOTE — Telephone Encounter (Signed)
Patient needs release from [place] to get an MRI. Please call.

## 2011-02-28 NOTE — Telephone Encounter (Signed)
RN faxed note to Delbert Harness at Fax # [phone] that it is ok for pt to have MRI with stents per Dr. Deborah Chalk.  Delbert Harness notified of note being sent.

## 2011-03-28 ENCOUNTER — Encounter: Payer: Self-pay | Admitting: Cardiology

## 2011-03-30 ENCOUNTER — Other Ambulatory Visit: Payer: Self-pay | Admitting: Cardiovascular Disease

## 2011-04-01 ENCOUNTER — Encounter: Payer: Self-pay | Admitting: Cardiology

## 2011-04-01 ENCOUNTER — Other Ambulatory Visit: Payer: Self-pay | Admitting: *Deleted

## 2011-04-01 MED ORDER — ISOSORBIDE DINITRATE 20 MG PO TABS: 20.0000 mg | ORAL_TABLET | Freq: Two times a day (BID) | ORAL | Status: DC

## 2011-04-01 NOTE — Telephone Encounter (Signed)
Fax received from pharmacy. Refill completed. Jodette Deeann Servidio RN  

## 2011-04-02 NOTE — H&P (Signed)
[name], [name] NO.:  [id number]   MEDICAL RECORD NO.:  [id number]          PATIENT TYPE:  INP   LOCATION:  1412                         FACILITY:  [facility]   PHYSICIAN:  Beckey Rutter, MD  DATE OF BIRTH:  1935-09-21   DATE OF ADMISSION:  11/24/2007  DATE OF DISCHARGE:                              HISTORY & PHYSICAL   PRIMARY CARE PHYSICIAN:  Unassigned.   CHIEF COMPLAINT:  Shortness of breath.   HISTORY OF PRESENT ILLNESS:  This is 76 year old Caucasian female with  past medical history significant for coronary artery disease,  hypercholesterolemia, hypertension, and tobacco abuse who presented to  the emergency department 2 days ago with mild shortness of breath.  She  was given doxycycline and steroids.  The patient went home, and she  continued to have shortness of breath and dyspnea, and she had to come  back because of the severity.  The patient stated the shortness of  breath started about 7 days ago and was just progressively worsening.  The shortness of breath is not associated with significant cough.  No  fever documented, but the patient felt she is a little warm.   PAST MEDICAL HISTORY:  Significant for:  1. Coronary artery disease status post stent.  Primary cardiologist is      Dr. Deborah Chalk.  2. Hypertension.  3. Hypercholesterolemia.   SOCIAL HISTORY:  Denied ethanol abuse, drug abuse. Currently a smoker.  She has been a smoker for the last 40 years or so.   FAMILY HISTORY:  Noncontributory.   MEDICATION ALLERGIES:  IV DYE.   MEDICATIONS:  1. Aspirin 325 mg.  2. Atenolol 25 mg daily.  3. Hydrochlorothiazide 25 mg.  4. Pravastatin 40 mg at night.  5. Prednisone 20 mg given at the recent ER visit on January 4.  6. Doxycycline 100 mg b.i.d., also given 2 days ago.  7. Ranitidine 300 mg daily.   REVIEW OF SYSTEMS:  Positive for fever.  The rest of Review of Systems  is negative.   PHYSICAL EXAMINATION:  VITAL SIGNS:  Temperature  99.5, blood pressure  133/53, pulse 90, respiratory rate 24.  HEENT:  Head atraumatic, normocephalic.  Eyes: PERRLA.  Oropharynx  moist, no ulcer.  NECK:  Supple.  The JVD.  PRECORDIUM:  Distal heart sounds covered by lung wheezes.  LUNGS:  Bilateral expiratory wheezes.  ABDOMEN:  Soft, nontender.  Bowel sounds present.  EXTREMITIES:  No lower extremity edema.  NEUROLOGIC:  Alert, oriented x3, moving all her extremities  spontaneously.   LABORATORY AND X-RAY DATA:  Today there are no labs drawn at the point  of care here in the emergency room.  The patient had labs done 2 days  ago on November 21, 2006. At that time her hemoglobin was 16.7, hematocrit  49.0. Potassium was 4.2.  Sodium was 135. Chloride was 100. Glucose was  116. BUN was 12, and creatinine was 1.2.  White count on January 4 was  9.0. Hemoglobin was 15.4. Hematocrit was 44.9, and platelet count was  191 on January 4, was essentially unremarkable.  Chest x-ray done today preliminary report showing chronically increased  interstitial markings, no focal acute findings.   ASSESSMENT AND PLAN:  This is a 76 year old female with past medical  history significant for coronary artery disease and hypertension who  came in today with chronic obstructive pulmonary disease.   PLAN:  1. The patient will be admitted for further assessment and management.  2. The patient will be continued on nasal oxygen and frequent      nebulizations.  3. I will start Avelox 400 mg IV daily.  4. I will start the patient on Solu-Medrol 60 mg q.8 h.  5. For her hypertension, I will continue hydrochlorothiazide.  6. For coronary artery disease, I will continue atenolol and aspirin.  7. For her dyspepsia symptoms, I will start the patient on Protonix.  8. For DVT prophylaxis, I will start the patient on Lovenox.      Beckey Rutter, MD  Electronically Signed     EME/MEDQ  D:  11/24/2007  T:  11/24/2007  Job:  [id number]

## 2011-04-02 NOTE — Discharge Summary (Signed)
[name], TILMON NO.:  [id number]   MEDICAL RECORD NO.:  [id number]          PATIENT TYPE:  INP   LOCATION:  1412                         [facility]ACILITY:  [facility]   PHYSICIAN:  Altha Harm, MDDATE OF BIRTH:  Jan 27, 1935   DATE OF ADMISSION:  11/24/2007  DATE OF DISCHARGE:  11/29/2007                               DISCHARGE SUMMARY   DISPOSITION:  Home.   FINAL DISCHARGE DIAGNOSES:  1. Acute bronchitis.  2. Acute exacerbation of COPD.  3. Hypertension.  4. Hyperlipidemia.  5. Chest pain, noncardiac.  6. History of coronary artery disease status post CABG.   DISCHARGE MEDICATIONS:  1. Aspirin 325 mg p.o. daily.  2. Atenolol 25 mg p.o. daily.  3. Hydrochlorothiazide 25 mg p.o. daily.  4. Pravastatin 40 mg p.o. q.h.s.  5. Ranitidine 160 mg p.o. daily.  6. Prednisone taper over 10 days for 50 mg down to 10 mg.  7. O2 2.5 to 3 liters nasal cannula tube, one around the clock, and      tapered by a primary care physician.   CONSULTATIONS:  None.   PROCEDURES:  None.   DIAGNOSTIC STUDIES:  1. CT pulmonary angiogram that shows no evidence of pulmonary embolus.  2. Moderate emphysema.  3. Scarring of the right apex, recommend follow up in six months to      insure stability.  4. Chest x-ray, portable, done on January 6, which shows no acute      focal findings.  Chronically, interstitial lung markings.   CODE STATUS:  Full code.   ALLERGIES:  NOVOLOG AND PLAVIX, CONTRAST MEDIA, ZOCOR, NAPROXEN.   However, please note that the patient has had several contrast studies  done in the past documented here in the hospital without any difficulty.  The patient does have contrast study done yesterday and has not had any  reaction to it.   CHIEF COMPLAINT:  Shortness of breath.   HISTORY OF PRESENT ILLNESS:  Please see the H&P dictated by Dr. Tamsen Roers  on November 24, 2007, for details of the HPI.   HOSPITAL COURSE:  #1 - The patient was started on IV steroids,  beta  agonists and Avelox on admission.  The patient had a slow course of  resolution of her hypoxemia and still has not resolved completely.  Considering the patient's response to therapy, there was considerations  for all the etiologies.  Pulmonary embolus was ruled out as a cause for  the patient.  The patient continues to improve, although slowly, she has  been counseled against tobacco cessation, and the patient is being  discharged home on oxygen and slow tapering dose of prednisone.  The  patient should followup with her primary care physician for further  evaluation and further tapering of the oxygen to off.  I have explained  to the patient that it is quite possible that she may need prolonged  oxygen before being tapered off of it.  However, I do believe that this  patient can come off of her oxygen in the long term.   #2 - CHEST PAIN, NONCARDIAC.  The patient did have one episode of chest  pain postprandial.  After assessment, it was felt that this discomfort  was actually due to GI upset rather than cardiac in nature.  Enzymes  were cycled and they were all negative.   #3 - HYPERTENSION.  The patient does take Atenolol secondary to her  history of coronary artery disease.  She is continued on her atenolol  and her hydrochlorothiazide.   Otherwise, the patient has remained stable.  She has completed a full  course of antibiotics and antibiotics are being stopped at this time.   DISCHARGE INSTRUCTIONS:  1. Dietary restrictions.  The patient should be on cardiac diet.  2. Physical restrictions.  None.  3. The patient has been fully counseled against tobacco cessation and      has been instructed to speak to her primary care physician about      mechanisms to assist with that.   FOLLOWUP:  The patient should follow up with her primary care physician  in 3-5 days.      Altha Harm, MD  Electronically Signed     MAM/MEDQ  D:  11/29/2007  T:  11/29/2007  Job:   [id number]

## 2011-04-05 NOTE — Cardiovascular Report (Signed)
[place]  Patient:    [name], [name]                    MRN: [id number] Proc. Date: 07/11/00 Adm. Date:  [id number] Disc. Date: [id number] Attending:  Eleanora Neighbor                        Cardiac Catheterization  HISTORY:  Ms. Wellborn is a 76 year old female with previous stents to the right coronary artery and stent to the left anterior descending in June and July of 2000.  She had residual disease in the second diagonal vessel.  She presents with atypical chest pain but similar to her previous chest pain and is referred now for catheterization.  She has a glucose intolerance, ongoing tobacco abuse and history of hypertension.  She is also on current medical management.  PROCEDURE:  Left heart catheterization with selective coronary angiography and left ventricular angiography.  TYPE AND SITE OF ENTRY:  Percutaneous right femoral artery with Perclose.  CATHETERS:  A #6 [place], 4 curved, Judkins right and left coronary catheters, #6 French pigtail ventriculographic catheter.  CONTRAST MATERIAL:  Omnipaque.  MEDICATIONS:  Prior to procedure, Valium 10 mg p.o.  MEDICATIONS:  During procedure versed 2 mg IV.  COMMENTS:  The patient tolerated the procedure well.  HEMODYNAMIC DATA:  The aorta pressure was 115/52, LV was 115/13.  There is no aortic valve gradient noted on pullback.  ANGIOGRAPHIC DATA:  1. RIGHT CORONARY ARTERY:  The right coronary artery is a large dominant    vessel.  The stent in the proximal segment is large and has excellent flow    with minimal if any restenosis.  There is a 20-30% proximal narrowing and    scattered irregularities throughout the remainder of the right coronary    artery.  2. LEFT MAIN CORONARY ARTERY:  Normal.  3. LEFT CIRCUMFLEX:  The left circumflex has a 40% narrowing before a    continuation of an obtuse marginal branch.  It is a relatively marginal    and there are irregularities  throughout this proximal segment, but it does    not appear to be obstructive disease.  4. LEFT ANTERIOR DESCENDING:  The left anterior descending has a stent that is    widely patent.  The first diagonal vessel is a large vessel.  There is    diffuse distal disease of the diagonal as well as in the continuation    branch of the left anterior descending.  There is 50-70% narrowing from the    distal branches of the diagonal and distal left anterior descending.  The    left anterior descending ends on the anterior wall.  LEFT VENTRICULAR ANGIOGRAM:  The left ventricular angiogram was performed in the RAO position.  Overall cardiac size and silhouette are normal.  Left ventricular function is normal.  OVERALL IMPRESSION: 1. Normal left ventricular function. 2. Persistent patency of the stent to the right coronary artery and left    anterior descending. 3. Residual 40-50% narrowing of the left circumflex, 50-70% of the distal    first diagonal and distal left anterior descending, and mild irregularities    in the right coronary artery.  DISCUSSION:  Will need to ask Ms. Bumgardner to modify her cardiovascular risk factors and in particular to stop smoking.  Will continue her current medications.  Overall it is felt that her outlook should be satisfactory with normal  function and only distal disease but she may have some degree of continued angina at times. DD:  07/11/00 TD:  07/13/00 Job: [id number]

## 2011-04-05 NOTE — Op Note (Signed)
NAMEAPOLONIA, ELLWOOD NO.:  [id number]   MEDICAL RECORD NO.:  [id number]          PATIENT TYPE:  AMB   LOCATION:  ENDO                         [facility]AC[facility]   PHYSICIAN:  Danise Edge, M.D.   DATE OF BIRTH:  [birth year]   DATE OF PROCEDURE:  01/01/2005  DATE OF DISCHARGE:                                 OPERATIVE REPORT   PROCEDURE:  Esophagogastroduodenoscopy, small bowel biopsy, colonoscopy and  biopsy.   REFERRING PHYSICIAN:  Windle Guard, M.D.   HISTORY:  [name] is a 76 year old female born October 05, 1935.  [name] has chronic nonbloody diarrhea which started after taking a  course of methylprednisolone for gout. She has nocturnal diarrhea. She has  upper abdominal discomfort, but denies hematochezia.   A December 12, 2004 stool culture negative. Stool screen for C. Difficile  toxin negative. Stool screen for ova and parasites negative.   Dr. Windle Guard has carefully reviewed her chronic medications and despite  eliminating medications, her diarrhea persisted.   MEDICATION ALLERGIES:  None.   CHRONIC MEDICATIONS:  Bayer aspirin, Protonix,  ticlopidine Crestor,  hydrochlorothiazide atenolol, isosorbide.   PAST MEDICAL HISTORY:  Hysterectomy, coronary artery disease, hypertension,  hypercholesterolemia, gastroesophageal reflux, colonoscopy 4 years ago  performed by Dr. Ritta Slot.   ENDOSCOPIST:  Danise Edge, M.D.   PREMEDICATION:  Versed 5 mg, Demerol 50 mg.   PROCEDURE:  ESOPHAGOGASTRODUODENOSCOPY WITH SMALL BOWEL BIOPSIES:  After  obtaining informed consent, [name] was placed in the left lateral  decubitus position. I administered intravenous Demerol and intravenous  Versed to achieve conscious sedation for the procedure. The patient's blood  pressure, oxygen saturation and cardiac rhythm were monitored throughout the  procedure and documented in the medical record.   The Olympus gastroscope was passed through the  posterior hypopharynx at the  proximal esophagus without difficulty. The hypopharynx, larynx and vocal  cords appeared normal.   ESOPHAGOSCOPY: The proximal, mid, and lower segments of the esophageal  mucosa appeared normal. The squamocolumnar junction and esophagogastric  junction are noted at 40 cm from the incisor teeth.   GASTROSCOPY:  Retroflex view of the gastric cardia and fundus was normal.  The gastric body, antrum and pylorus appeared normal.   DUODENOSCOPY: The duodenal bulb, mid duodenum and distal duodenum appeared  normal.   BIOPSIES: Six biopsies were taken from the second - third portions of the  duodenum to look for celiac sprue.   ASSESSMENT:  Normal esophagogastroduodenoscopy. Small bowel biopsies, rule  out celiac sprue pending.   PROCEDURE:  DIAGNOSTIC COLONOSCOPY WITH COLONIC BIOPSIES.  Anal inspection  and digital rectal exam were normal. The Olympus adjustable pediatric  colonoscope was introduced into the rectum and advanced to the cecum.  Colonic preparation exam today was satisfactory.   RECTUM:  Normal.  SIGMOID COLON AND DESCENDING COLON:  Colonic diverticulosis.  SPLENIC FLEXURE:  Normal.  TRANSVERSE COLON:  Normal.  HEPATIC FLEXURE:  Normal.  ASCENDING COLON:  Normal.  CECUM AND ILEOCECAL VALVE:  Normal.   BIOPSIES:  Three biopsies were taken from the right colon and two  biopsies  were taken from the left colon to look for lymphocytic - collagenous  colitis.   ASSESSMENT:  Left colonic diverticulosis; otherwise normal proctocolonoscopy  to the cecum. Colonic biopsies, rule out lymphocytic - collagenous colitis  pending.      MJ/MEDQ  D:  01/01/2005  T:  01/01/2005  Job:  [id number]   cc:   Windle Guard, M.D.  [address]  [place], [place] [address]  Fax: [phone]

## 2011-04-05 NOTE — H&P (Signed)
NAMENYIMAH, SHADDUCK NO.:  [id number]   MEDICAL RECORD NO.:  [id number]          PATIENT TYPE:  OIB   LOCATION:                               FACILITY:  [facility]   PHYSICIAN:  Colleen Can. Deborah Chalk, M.D.DATE OF BIRTH:  08-Dec-1934   DATE OF ADMISSION:  10/10/2004  DATE OF DISCHARGE:                                HISTORY & PHYSICAL   CHIEF COMPLAINT:  Chest pain.   HISTORY OF PRESENT ILLNESS:  Mrs. Garrod is a 76 year old white female who  has multiple medical problems.  She presents to the office as a work-in  appointment on Monday, October 08, 2004, with several day history of chest  discomfort.  She describes it as a tight bandlike feeling across the mid  sternal region.  She has had some involvement up into the left arm.  It  started on Saturday and it has not really been exertional in nature.  She  did try some aspirin with really no relief.  She was given  nitroglycerin  prior to her visit in our office and had no relief.  She notes her symptoms  are worse if she slouches over and somewhat improved if she is able to sit  up straight.  There may perhaps be some degree of shortness of breath.  She  has had two episodes of where she got hot.  She has had no  lightheadedness, no dizziness.  Her pain has basically been constant and she  states is identical to her previous chest pain syndrome.  She has had a  recent bout of cold and bronchitis about three weeks ago which was treated  with antibiotics and has been slow to resolve.   ALLERGIES:  1.  IV CONTRAST.  2.  PLAVIX.  3.  Questionably  ZOCOR.   CURRENT MEDICATIONS:  1.  Crestor 5 mg a day.  2.  Calcium daily.  3.  Aspirin daily.  4.  Hydrochlorothiazide 25 mg daily.  5.  Protonix 40 mg a day.  6.  Atenolol 25 mg a day.   PAST MEDICAL HISTORY:  1.  Atherosclerotic cardiovascular disease.  2.  History of stent placement to the right coronary in June of 2000 with      known residual disease in the left  system.  3.  She had repeat catheterization with stent placement to the LAD in July      of 2000 with her last catheterization in January of 2002 showing normal      LV function, patent stents in the right coronary and LAD with residual      50% disease in the second diagonal vessel, 30 to 40% narrowing in the      left circumflex and 30 to 40% narrowing in the proximal right coronary.      Her last Cardiolite study was in December of 2004.  4.  Ongoing tobacco abuse.  5.  Plavix allergy.  6.  Recent bronchitis.  7.  Hyperlipidemia.  8.  Hypertension.  9.  Gastroesophageal reflux disease.  10. History of glucose intolerance.  11. Remote  history of GI bleed.   FAMILY HISTORY:  This is remarkable for father having died suddenly, the  exact causes are unknown.  Her mother died of bone cancer.   SOCIAL HISTORY:  She is currently not working. She continues to smoke  cigarettes.  She has no history of alcohol use.  She is a widow.   REVIEW OF SYMPTOMS:  Basically as noted above.  Unfortunately, she does  continue to smoke.  She is not able to quantify the amount.  She has had  resolving bronchitis but no recent fever or flulike symptoms.  Her chest  pain is as described above. She has some degree of reflux as well, but that  seemingly has been stable with Protonix that she takes on a regular basis.  She has had no abdominal pain, no constipation, no diarrhea, no complaints  of edema.   PHYSICAL. EXAMINATION:  GENERAL APPEARANCE:  She is an anxious white female,  currently in no acute distress.  VITAL SIGNS:  Blood pressure 140/70 sitting and standing, heart rate 64,  weight is 160 pounds, skin is warm and dry.  Color is unremarkable.  She  does smell of tobacco.  LUNGS:  Somewhat coarse with expiratory wheezes.  CARDIOVASCULAR:  Regular rhythm.  There is no murmur.  ABDOMEN:  Soft with positive bowel sounds.  She has no chest wall  tenderness.  EXTREMITIES:  Without edema.    PERTINENT LABORATORY DATA:  Pertinent labs are pending.   Her EKG shows no acute changes with normal sinus bradycardia.   IMPRESSION:  1.  Prolonged episode of chest pain.  2.  Known history of coronary disease with previous stents to the right      coronary as well as left anterior descending.  3.  Hypertension.  4.  Hyperlipidemia.  5.  IV contrast allergy.  6.  Allergy to Plavix.  7.  Ongoing tobacco abuse.  8.  Reflux.   PLAN:  Will proceed on with repeat catheterization.  The patient was  strongly advised to proceed on with admission to the hospital at the time of  her visit in the office.  She adamantly refused admission to the hospital.  The risk  associated with that were explained to her in detail.  She did verbalize  understanding.  She was subsequently started on Imdur 60 mg a day.  Cardiac  enzymes were drawn accordingly.  She will proceed on with outpatient  catheterization per her request.       ________________________________________  Sharlee Blew, N.P.  ___________________________________________  Colleen Can. Deborah Chalk, M.D.    LC/MEDQ  D:  10/09/2004  T:  10/09/2004  Job:  [id number]   cc:   Windle Guard, M.D.  [address]  [place], [place] [address]  Fax: [phone]

## 2011-04-05 NOTE — H&P (Signed)
NAME:  ASNA, MULDROW NO.:  [id number]   MEDICAL RECORD NO.:  [id number]          PATIENT TYPE:  EMS   LOCATION:  MAJO                         FACILITY:  [facility]   PHYSICIAN:  Darden Palmer., M.D.DATE OF BIRTH:  05-14-1935   DATE OF ADMISSION:  10/13/2004  DATE OF DISCHARGE:                                HISTORY & PHYSICAL   REASON FOR ADMISSION:  Chest and left arm pain.   HISTORY:  This 76 year old female has a prior history of coronary artery  disease with stents to right coronary artery in June 2000, a stent to the  LAD in July 2000, and previous history of hypertension and hyperlipidemia.  She recently began to have chest discomfort and was seen by Dr. Deborah Chalk on  October 08, 2004 describing it as a band-like feeling across the mid  sternal region with radiation up into the left arm and it was continuous  pain which is worse if she bent over and improved if she was able to sit up.  She had some mild dyspnea.  The pain had basically been constant and as  stated it was identical to her previous chest pain syndrome when she had her  stents placed.  She refused hospital admission on October 08, 2004 and was  admitted by Dr. Deborah Chalk on October 10, 2004, and at that time was found to  have the two previously stent sites patent, she was found to have a distal  disease in the LAD at a diagonal branch and had stenting with two 2.5  millimeter CYPHER stents at a bifurcation with a kissing balloon deployment  of the stents.  Her enzymes remained negative following this and her postop  EKG was okay.  She was discharged home the next day.  She had some slight  dizziness that was orthostatically resolved after discharge.  She went home  and had malaise and fatigue since going home.  Yesterday she had the onset  of recurrence of epigastric burning as well as burning across her upper  chest and into her left arm.  The pain occurred intermittently yesterday and  then became progressive today when she called.  She also was complaining of  constipation and generally did not feel well.  Because of the recurrent  nature of the symptoms, she stated were identical to her previous symptoms,  she was admitted at this time for evaluation.  She continues to complain of  the constant pain which is present even now.  Her troponin was 0.03  initially, CPK was 265 with an MB of 4.4.   PAST MEDICAL HISTORY:  1.  Prior history of hypertension.  2.  Hyperlipidemia.  3.  Previous history of reflux.  4.  History of glucose intolerance  5.  Remote history of a gastrointestinal bleed.   She previously had smoked, but has quit smoking as of her most recent  angioplasty.   ALLERGIES:  1.  IV CONTRAST.  2.  PLAVIX.  3.  ZOCOR.   CURRENT MEDICATIONS:  1.  Crestor 5 mg daily.  2.  Paxil 250 b.i.d.  3.  Aspirin 325 daily.  4.  Protonix 40 b.i.d.  5.  Atenolol 25 mg daily.  6.  IMDUR 60 mg daily.  7.  HCTZ 25 mg daily.   FAMILY HISTORY/SOCIAL HISTORY:  Recorded in the recent records previously.   REVIEW OF SYSTEMS:  She has had previous bronchitis and had some esophageal  reflux which she takes Protonix for, and states her current pain is  different from her previous reflux-type symptoms other than is described in  the history of present illness and review of systems is unchanged from that  previously dictated.   PHYSICAL EXAMINATION:  She is an elderly white female who does not appear in  any acute distress.  Her blood pressure was 158/72, pulse was 64,  temperature 96.1, respirations 20.  SKIN:  Warm and dry.  Cath site has a large ecchymoses noted.  ENT:  EOMI, PERRLA.  CNS clear.  Pharynx negative.  NECK:  Supple, no masses, no JVD, thyromegaly or bruits.  LUNGS:  Clear with no rales or wheezing noted.  CARDIAC:  Normal S1 and S2.  No S3, S4 or murmur.  ABDOMEN:  Soft and nontender, there is no epigastric tenderness, and bowel  sounds are present.   There is a large ecchymoses noted at the groin.  Femoral pulses are 2+.  EXTREMITIES:  No edema.  NEUROLOGIC:  Normal.   LABORATORY EXAMINATION:  Sodium 126 with a potassium of 3.8, chloride 91,  CO2 27 and a glucose of 151, BUN is 12 and creatinine is 1.3, liver enzymes  were normal.  Initial troponin is 0.03.  CPK is 265 with an MB of 4.4.  White count is 11,600, hemoglobin 12.9, hematocrit 37.   IMPRESSION:  1.  Constant chest burning and left arm discomfort.  This she states      resembles her previous pain at which time she had her stent placement.      This either could be noncardiac pain or could be a recurrent problem      with the stent.  2.  Coronary artery disease with a recent bifurcation stenting of the distal      left anterior descending artery with two 2.5 millimeter CYPHER drug-      eluting stents.  3.  Previous stent in the proximal left anterior descending artery which was      patent.  See previous stent of the right coronary artery which was      patent.  4.  Hypertension.  5.  Hyperlipidemia.  6.  History of reflux.  7.  History of tobacco abuse.  8.  Hyponatremia.   RECOMMENDATIONS:  Symptoms are somewhat vague and nondescript.  She does  have hyponatremia.  I have recommended that she discontinue her HCTZ and we  will recheck basic metabolic panel in the morning.  Check serial enzymes.  Begin Lovenox.  Further workup per Dr. Deborah Chalk.      Kristine Royal   WST/MEDQ  D:  10/13/2004  T:  10/13/2004  Job:  [id number]   cc:   Colleen Can. Deborah Chalk, M.D.  Fax: [phone]   Windle Guard, M.D.  [address]  [place], [place] [address]  Fax: [phone]

## 2011-04-05 NOTE — Cardiovascular Report (Signed)
NAMEQUANTINA, DERSHEM NO.:  [id number]   MEDICAL RECORD NO.:  [id number]          PATIENT TYPE:  INP   LOCATION:  3311                         FACILITY:  [facility]   PHYSICIAN:  Colleen Can. Deborah Chalk, M.D.DATE OF BIRTH:  09/20/1935   DATE OF PROCEDURE:  10/15/2004  DATE OF DISCHARGE:                              CARDIAC CATHETERIZATION   HISTORY:  Ms. Plasencia had a complex stent to her LAD and diagonal on October 10, 2004.  She was discharged home and returned three days later with  recurrent chest pain.  She is admitted and had myocardial infarction ruled  out.  She is now referred for catheterization.   PROCEDURE:  Left heart catheterization with selective coronary angiography,  left ventricular angiography.   TYPE AND SITE OF ENTRY:  Percutaneous, left femoral artery with Angio-Seal.   CATHETERS:  6 French 4 curved Judkins right and left coronary catheters; 6  French pigtail ventriculographic catheter.   CONTRAST MATERIAL:  Visipaque.   MEDICATIONS GIVEN PRIOR TO PROCEDURE:  Benadryl, Pepcid, prednisone.   MEDICATIONS GIVEN DURING THE PROCEDURE:  Versed 2 mg IV.   COMMENTS:  The patient tolerated the procedure well.   HEMODYNAMIC DATA:  1.  The aortic pressure was 98/46.  2.  LV was 103/1-5.  3.  There was no aortic valve gradient noted on pullback.   ANGIOGRAPHIC DATA:  1.  The left main coronary artery essentially normal.  2.  LAD:  Left anterior descending has a patent stent proximally.  There is      a proximal first diagonal with moderate 50-60% narrowing and tortuosity.      The second diagonal has at least 50-60% narrowing after the stent, but      the area for the stent and bifurcation lesion are widely patent with      excellent TIMI-3 flow.  3.  Left circumflex:  The left circumflex has 60-70% narrowing in proximal      portions.  It is a tortuous vessel extending into the obtuse marginal      system.  4.  Right coronary artery:  The right  coronary artery is dominant vessel.      There are irregularities proximally and the stent is widely patent.      There is good distal flow.   Left ventricular angiogram was performed in  RAO position.  Overall cardiac  size and silhouette are normal.  Global ejection fraction is 60-70%.  Regional wall motion is normal.   OVERALL IMPRESSION:  1.  Normal left ventricular function.  2.  Persistent patency of the stents in the left anterior descending      proximally and at the bifurcation lesion with the second diagonal left      anterior descending and persistent patency of the stents in the right      coronary artery, but with residual disease in the proximal left      circumflex, first diagonal vessel, and second diagonal vessel after the      stent location.   DISCUSSION:  All of the residual stenoses appear to  be moderate to  moderately severe in nature and do not appear to be the cause of unrelenting  chest pain.  We will continue to manage Ms. Vanwagoner medically.  Hopefully, we  can have some healing in the small diagonal vessels.  It may be that she  would be a candidate for stent placement in the more proximal left  circumflex if pain recurs.       SNT/MEDQ  D:  10/15/2004  T:  10/15/2004  Job:  [id number]

## 2011-04-05 NOTE — Procedures (Signed)
[facility]  Patient:    [name], [name]                    MRN: [id number] Proc. Date: 09/19/00 Adm. Date:  [id number] Attending:  Deneen Harts CC:         Dr. Berline Chough   Procedure Report  PROCEDURE:  Colonoscopy.  INDICATIONS:  A 76 year old white female with complaints of abdominal pain, pressure-type discomfort in the mid abdomen, associated with altered bowel movements with thinning stool caliber, tenesmus, and increased straining. Denies hematochezia.  No prior colorectal neoplasia surveillance.  The patient underwent upper endoscopy on August 29, 2000, for similar complaints without abnormality noted.  DESCRIPTION OF PROCEDURE:  After reviewing the nature of the procedure with the patient, including potential risks and complications, and after discussing alternative methods of diagnosis and treatment, informed consent was signed.  The patient was premedicated, receiving IV sedation administered in divided doses totalling Versed 7.5 mg and fentanyl 75 mcg.  Using an Olympus pediatric PCF-140L colonoscope, the rectum was intubated after normal digital examination.  The scope could be advanced to the splenic flexure, but no beyond due to tortuosity of the colon.  After multiple attempts, including rotating the patient into a supine and right lateral decubitus position, it was elected to exchange the colonoscope for a standard size.  Reintubation was easily accomplished and the scope much more easily advanced around the entire length of the colon to the cecum.  This was identified by the appendiceal orifice and ileocecal valve.  Preparation was fair throughout. Copious irrigation required due to Visicol residue.  Excellent visualization with irrigation revealed no abnormality, except for mild sigmoid diverticulosis.  There was no evidence of neoplasia, mucosal inflammation, or vascular abnormality.  No lesion to explain symptoms  of abdominal pain or altered bowel movements.  The retroflexed view did not reveal any anorectal disease, specifically without hemorrhoids or fissure. The colon was then decompressed and scope withdrawn.  The patient tolerated the procedure well without difficulty, being maintained on Datascope monitor and low-flow oxygen throughout.  TIME:  Three.  TECHNICAL:  Two.  PREPARATION:  Two.  TOTAL SCORE:  Seven.  ASSESSMENT: 1. Diverticulosis, mild, sigmoid. 2. No colorectal neoplasia. 3. No lesion to explain symptoms of abdominal pain.  RECOMMENDATIONS: 1. Annual Hemoccult test per Dr. Berline Chough. 2. Repeat colonoscopy in 10 years. 3. Return office visit in one month to review symptoms, findings, and discuss    need for further evaluation.  Consider abdominal CT, repeat abdominal    ultrasound, and CCK-HIDA scan. DD:  09/19/00 TD:  09/21/00 Job: [id number]

## 2011-04-05 NOTE — H&P (Signed)
[facility]  Patient:    [name], [name]                    MRN: [id number] Adm. Date:  [id number] Disc. Date: [id number] Attending:  Deneen Harts Dictator:   Jennet Maduro. Earl Gala, R.N., A.N.P.-C. [facility]. Jeannetta Nap, M.D.   History and Physical  CHIEF COMPLAINT:  Recurrent left arm discomfort, as well as complaints of chest fullness.  HISTORY OF PRESENT ILLNESS:  [name] is a 76 year old white female who has known atherosclerotic cardiovascular disease.  She has had previous revascularization with stent placement to the right coronary artery in June 2000, as well as stent placement to the left anterior descending coronary artery in July 2000.  Her last cardiac catheterization was in July 2000, at which time she also demonstrated normal LV function, with a residual distal stenosis in the second diagonal vessel, and mild atherosclerosis in the left circumflex system.  She had a follow-up Cardiolyte in January 2001, which was felt to be satisfactory.  She came in for her six-month follow-up check on December 02, 2000, at which time she was complaining of continued burning in the left arm.  There was a feeling of "pins and needles" in the legs, and she was having fullness in her chest.  She has continued to have trouble with gastroesophageal reflux disease, but is currently off of her Prilosec.  She has recently had a GI evaluation, and was told that there were no abnormalities found.  She is now referred for a repeat cardiac catheterization.  PAST MEDICAL HISTORY: 1. ASCVD with previous stent placement to the right coronary artery    in June 2000, subsequent stent placement to the left anterior descending    coronary artery in July 2000, with the last cardiac catheterization in    August 2001, with known residual disease in the second diagonal vessel. 2. Hypertension. 3. Hypercholesterolemia. 4. Ongoing tobacco abuse. 5.  Gastroesophageal reflux disease. 6. History of glucose intolerance. 7. Previous history of a GI bleed.  ALLERGIES:  IVP DYE, QUESTIONABLE PLAVIX, QUESTIONABLE ZOCOR.  CURRENT MEDICATIONS: 1. Lipitor 10 mg q.d. 2. Imdur 30 mg q.d. 3. HCTZ 25 mg q.d. 4. Atenolol 50 mg q.d. 5. Aspirin q.d.  FAMILY HISTORY:  Unchanged from the prior record.  SOCIAL HISTORY:  Unchanged from the prior record.  REVIEW OF SYSTEMS:  Otherwise as stated above.  PHYSICAL EXAMINATION:  GENERAL:  She remains somewhat anxious.  She is in no acute distress.  VITAL SIGNS:  Blood pressure 130/60 sitting, 140/70 standing, heart rate 60 and regular.  Weight 159 pounds.  SKIN:  Warm and dry.  Color is unremarkable.  NECK:  Supple.  No jugular venous distention.  LUNGS:  Coarse.  CARDIAC:  A regular rhythm.  EXTREMITIES:  Show no evidence of edema.  NEUROLOGIC:  Intact.  LABORATORY DATA:  Currently pending.  OVERALL IMPRESSION: 1. Continued bouts of left arm burning, as well as chest fullness,    of questionable etiology. 2. Known coronary artery disease with the last cardiac catheterization in    August 2000. 3. Ongoing tobacco abuse. 4. Gastroesophageal reflux disease. 5. Hypertension. 6. History of glucose intolerance.  PLAN:  Will proceed on with elective cardiac catheterization on December 09, 2000. DD:  12/03/00 TD:  12/03/00 Job: [id number]

## 2011-04-05 NOTE — Procedures (Signed)
[facility]  Patient:    [name], [name]                    MRN: [id number] Proc. Date: 08/29/00 Adm. Date:  [id number] Disc. Date: [id number] Attending:  Deneen Harts CC:         Dr. Berline Chough   Procedure Report  PROCEDURE:  Panendoscopy.  INDICATIONS:  A 76 year old white female with persistent epigastric pain.  The symptoms persist despite Prilosec 20 mg daily for the past month.  DESCRIPTION OF PROCEDURE:  After reviewing the nature of the procedure with the patient, including potential risks and complications and after discussing alternative methods of diagnosis and treatment, informed consent was signed.  The patient was premedicated, receiving Versed 6 mg and fentanyl 50 mcg administered IV in divided doses prior to and during the course of the procedure.  Using an Olympus video endoscope, the proximal esophagus was intubated under direct vision.  The oropharynx was normal without lesion of the epiglottis, vocal cords, or piriform sinus.  The proximal mid and distal segments of the esophagus were normal.  No evidence of mucosal inflammation to suggest reflux disease.  No neoplasia.  No exudate.  Mucosal Z-line distinct at 40 cm.  No hiatal hernia detected.  The gastric fundus, body, and antrum were normal.  Pylorus symmetric.  No lesion to explain abdominal pain.  The duodenal bulb and second portion were similarly normal without evidence of peptic inflammation or other lesion. Retroflexed view of the angularis, lesser curve, cardia, and fundus did not reveal any abnormality.  The stomach was decompressed and the scope withdrawn.  The patient tolerated the procedure without difficulty, being maintained on Datascope monitor and low-flow oxygen throughout.  ASSESSMENT: 1. Normal endoscopy. 2. No explanation for epigastric pain.  RECOMMENDATIONS: 1. Continue Prilosec 20 mg p.o. daily. 2. Schedule colonoscopy to further evaluate  abdominal pain and for neoplasia    surveillance. 3. Consider abdominal CT pending results of colonoscopy. DD:  08/29/00 TD:  08/31/00 Job: [id number]

## 2011-04-05 NOTE — H&P (Signed)
[name], [name] NO.:  [id number]   MEDICAL RECORD NO.:  [id number]          PATIENT TYPE:  INP   LOCATION:  1825                         FACILITY:  [facility]   PHYSICIAN:  Elmore Guise., M.D.DATE OF BIRTH:  04/26/1935   DATE OF ADMISSION:  03/28/2005  DATE OF DISCHARGE:                                HISTORY & PHYSICAL   INDICATION FOR ADMISSION:  Substernal chest pain.   PRIMARY CARDIOLOGIST:  Colleen Can. Deborah Chalk, M.D., [facility].   HISTORY OF PRESENT ILLNESS:  The patient is a very pleasant 76 year old  white female with a past medical history of coronary artery disease (status  post multiple PCIs), dyslipidemia, tobacco dependence, GERD, chronic  diarrhea (off and on for the last 6 months), who presents to [facility]  emergency room with multiple complaints.  She is seen with her daughter, and  her daughter actually reports that her mom is down-playing her symptoms  currently.  However, Ms. Mckillop reports off and on left anterior chest pain  that has been occurring for the last month.  She says it feels different  than her normal heart pain, and typically is improved with Aspercreme and  Tylenol.  Also notes some improvement with getting up and around.  However, the patient reports a different type of pain last evening.  She  said she had substernal chest pressure, which felt like my normal heart  pain.  It was associated with nausea and diaphoresis.  She took 3  sublingual nitroglycerin with little improvement; however, she did say it  got better on its own following her third nitroglycerin.  She was  scheduled for a stress yesterday; however, she missed it because she has  been having off and on lower abdominal cramping, as well as diarrhea and  decreased p.o. intake, and she just did not feel like it.  She sees Dr.  Laural Benes for GI follow up, and has undergone colonoscopy and EGD in the past.  She has a known history of diverticulosis.   This morning, the patient was on  her couch, had an episode of nausea and her abdominal pain, then she started  having off and on chest tightness.  She was brought here to the ER for  further evaluation.  The initial set of enzymes and EKG are negative.   REVIEW OF SYSTEMS:  Per HPI; otherwise, negative.   CURRENT MEDICATIONS:  1.  Ticlid 250 mg b.i.d.  2.  Imdur 60 mg daily.  3.  Atenolol 25 mg daily.  4.  Hydrochlorothiazide 25 mg daily.  5.  Crestor 5 mg daily.  6.  Protonix 40 mg b.i.d.   ALLERGIES:  1.  PLAVIX.  2.  IV CONTRAST DYE.  3.  ZOCOR.   FAMILY HISTORY:  Positive for cancer and a questionable history of coronary  disease in her father who died suddenly..   SOCIAL HISTORY:  She is widowed.  Lives by herself in [place], [place]  [place].  She does smoke off and on, and has done so for some time.  No  alcohol.   PHYSICAL  EXAMINATION:  VITAL SIGNS:  She is afebrile.  Pulse is in the 50s.  Blood pressure of 130-140/60-70, saturation 98% on 2 liters nasal cannula.  GENERAL:  Currently, she is in no pain.  She is alert and oriented x4.  In  no acute distress.  NECK:  Supple.  No lymphadenopathy.  There were 2+ carotids.  No JVD, no  bruits.  LUNGS:  Clear with prolonged expiration noted.  HEART:  Regular with distant heart sounds.  No significant murmurs, gallops,  or rubs.  ABDOMEN:  Soft, nontender, nondistended, with hyperactive bowel sounds.  EXTREMITIES:  Warm with 2+ pulses and no significant edema.  NEUROLOGIC:  She is intact with no focal deficits.   LABORATORY DATA:  Significant laboratory values show a hemoglobin of 14.0,  white count of 10.1, platelets of 251.  She had a D-dimer of 0.25, BUN and  creatinine of 10 and 1.3, potassium 3.4, myoglobin 123, troponin I less than  0.05, and CPK-MB is 1.1.  Chest x-ray is normal.  EKG shows sinus  bradycardia at a rate of 56 per minute, incomplete right bundle branch  block, and no significant ST-T wave changes.   Her last catheterization was  done October 15, 2004 showing a normal left main, LAD with a patent  proximal stent.  First and second diagonal had 50% to 60% proximal and 50%  to 60% mid stenosis.  LCx had a proximal 60% to 70% stenosis, and RCA had  proximal patent stents with mild luminal irregularities in the mid distal  vessel.  Her EF was normal.   The patient, after her exam was complete, is a poor historian regarding her  different types of chest pain; however, she does have known disease, and was  scheduled for outpatient evaluation to be completed this week.   IMPRESSION:  1.  Typical and atypical chest pain.  2.  Known coronary artery disease status post multiple interventions in the      past with known moderate branch vessel disease now.  3.  Arthritic pain.  4.  Ongoing off and on diarrhea and GI problems treated with Imodium and      Pepto-Bismol.   PLAN:  Some cardiovascular disease.  Will admit for observation.  Continue  serial cardiac enzymes.  Will schedule the patient for an adenosine  Cardiolite for the a.m.  Will continue all her medications that she is on  right now, and give her one shot of Lovenox.  For her arthritic pain, will  have ibuprofen and Toradol as needed, as well as Tylenol for GI problems.  Will continue Imodium and Pepto-Bismol as needed.  The patient will be  followed in the morning by Dr. Delfin Edis, who will take over her care at  that time.      TWK/MEDQ  D:  03/28/2005  T:  03/28/2005  Job:  [id number]   cc:   Colleen Can. Deborah Chalk, M.D.  Fax: [phone]

## 2011-04-05 NOTE — Cardiovascular Report (Signed)
[name], [name] NO.:  [id number]   MEDICAL RECORD NO.:  [id number]          PATIENT TYPE:  OIB   LOCATION:  6522                         FACILITY:  [facility]   PHYSICIAN:  Colleen Can. Deborah Chalk, M.D.DATE OF BIRTH:  [birth year]   DATE OF PROCEDURE:  10/10/2004  DATE OF DISCHARGE:                              CARDIAC CATHETERIZATION   PROCEDURE:  Left heart catheterization with selective coronary angiography,  left ventricular angiography, stent placement in the LAD and diagonal via  kissing V stent technique.   TYPE AND SITE OF ENTRY:  Percutaneous, right femoral artery.   CATHETERS:  6 French 4 curved Judkins right and left coronary catheters; 6  French pigtail ventriculographic catheter; 7 [place] JL-4 guide, two Prowater  guide wires, one 2.0 x 15-mm Quantum balloon, a 2.5 x 13-mm Cypher stent in  the LAD, and a 2.5 x 18-mm Cypher stent in the diagonal.   COMMENTS:  The patient tolerated the procedure well.   IV nitroglycerin was given during the intervention as well as integrilin and  heparin.  The patient received Ticlid at the end of the procedure because of  allergies to PLAVIX.   HEMODYNAMIC DATA:  1.  Aortic pressure 131/61.  2.  LV was 118/8-16.  3.  There was no aortic valve gradient noted on pullback.   ANGIOGRAPHIC DATA:  The left ventricular angiogram was performed in the RAO  position.  Overall cardiac size and silhouette were normal.  Global ejection  fraction was 55-60%.   CORONARY ARTERIES:  1.  Right coronary artery:  The right coronary artery is a large, dominant      vessel.  The stent in the right coronary artery is widely patent.  There      are irregularities in the right coronary artery, but it is a large      vessel that supplies flow to the inferior lateral wall.  2.  Left main coronary artery is normal.  3.  Left circumflex:  Left circumflex has a very small high obtuse marginal.      The main portion of the left circumflex  continues as an obtuse marginal.      There is a 50% stenosis located somewhat focally at that point in time      and then the large posterior lateral branch is free of significant focal      disease.  This is an area of interest from a standpoint of      revascularization, but on multiple views it is felt not to be greater      than 50-60% narrowed.  4.  Left anterior descending:  Left anterior descending has a patent stent      proximally.  The first diagonal is a large vessel with 50-60% narrowing.      The left anterior descending bifurcates at the second diagonal vessel      and at the continuation point.  At this bifurcation there is a 90%      stenosis in the left anterior descending and sequential 70-80% stenoses      in this  second diagonal vessel.   ANGIOPLASTY PROCEDURE:  At this point in time we elected to proceed on with  an angioplasty procedure of the distal left anterior descending.  Using 7  [place] JL-4 guides, two Prowater guide wires were passed into the second  diagonal and into the continuation of the left anterior descending.  We  initially predilated with a 2.0 x 15-mm Quantum Maverick balloon.  The  predilatation was at 15 atmospheres.  We then returned with a 2.5 x 18-mm  Cypher stent in the diagonal vessel and a 2.5 x 13-mm Cypher stent in the  left anterior descending.  These stents overlapped at the level of the  bifurcation.  We initially inflated the LAD stent and then the diagonal  stent and the balloons were inflated simultaneously with an excellent result  being obtained.  In the second diagonal vessel just beyond the stent there  was 30% narrowing that may be accounted for decrease in vessel size at this  point versus possible residual plaque.   OVERALL IMPRESSION:  1.  Successful V-stent in the left anterior descending.  2.  Patent stent in the proximal left anterior descending and proximal right      coronary artery.  3.  Moderate stenosis in the  proximal left circumflex and first diagonal      vessel.  4.  Normal left ventricular function.  5.  Successful bifurcation stent procedure on the left anterior descending.       SNT/MEDQ  D:  10/10/2004  T:  10/10/2004  Job:  [id number]

## 2011-04-05 NOTE — Discharge Summary (Signed)
[name], [name] NO.:  [id number]   MEDICAL RECORD NO.:  [id number]          PATIENT TYPE:  INP   LOCATION:  4703                         FACILITY:  [facility]   PHYSICIAN:  Colleen Can. Deborah Chalk, M.D.DATE OF BIRTH:  1935-02-21   DATE OF ADMISSION:  03/28/2005  DATE OF DISCHARGE:  03/29/2005                                 DISCHARGE SUMMARY   PRIMARY DISCHARGE DIAGNOSIS:  Atypical chest pain with subsequent adenosine  Cardiolite study showing no evidence of ischemia, no wall motion abnormality  with normal ejection fraction at 72%.   SECONDARY DISCHARGE DIAGNOSES:  1. Known atherosclerotic cardiovascular disease with previous history of      revascularization performed most recently in November of 2005 with      stents placed in the left anterior descending/diagonal bifurcation, as      well as prior history of stent placement to the right coronary in June      of 2000.  Previous stent placement to the LAD in July of 2000.  2. Ongoing tobacco abuse.  3. Plavix allergy.  4. IV contrast allergy.  5. Chronic diarrhea with microscopic colitis.  6. Chronic headaches.  7. Anxiety.  8. Hyperlipidemia, currently taken off of Crestor due to myalgias.  9. Hypertension.  10.Gastroesophageal reflux disease.     HISTORY OF PRESENT ILLNESS:  The patient is a 76 year old white female who  has multiple medical problems.  She presents to the emergency room  department on Mar 28, 2005 with atypical chest pain.  She was supposed to  have had a follow-up adenosine Cardiolite in the office earlier this week;  however, that appointment was cancelled by the patient.  She presents with  an episode of substernal chest pressure that was associated with nausea and  diaphoresis as well as left shoulder pain.  She apparently had been using  Tylenol as well as Aspercreme to her shoulder with relief.  She had little  relief with nitroglycerin.  She has continued to have bouts of diarrhea  as  well as a headache and overall just states, I'm so sick.  She was  subsequently seen and evaluated by Dr. Reyes [name] and was admitted for further  evaluation.   Please see the history and physical per Dr. Lady [name] for further patient  presentation and profile.   LABORATORY DATA:  Cardiac enzymes were all negative.  CBC was normal.  Chemistries were satisfactory except for potassium that was low at 3.3.   Chest x-ray showed mild cardiomegaly with COPD with no active disease.  A 12  lead electrocardiogram showed no acute changes.   HOSPITAL COURSE:  The patient was admitted.  She had negative cardiac  enzymes.  We proceeded on with adenosine Cardiolite study on Mar 29, 2005.  That procedure was tolerated well without any known complications.  There  was no evidence of ischemia.  There was normal wall motion and ejection  fraction 72%.  At this time it is felt that she is stable from our  standpoint.  Her Ticlid will be discontinued.  She has been complaining of  diffuse myalgias since being placed back on Crestor and will go ahead and  stop that as well.  Unfortunately her diarrhea continues and we will ask her  to follow up with Dr. Reece [name] for repeat follow-up evaluation.   CONDITION ON DISCHARGE:  Stable.   DISCHARGE MEDICATIONS:  1. We will stop the Ticlid.  2. We will stop the Crestor.  3. She will continue Protonix as she was taking before.  4. Will continue Imdur 60 mg a day.  5. She will continue with Pepto-Bismol and Imodium tablets as before.  6. Will continue atenolol 25 mg a day.  7. Aspirin daily.  8. Hydrochlorothiazide 25 mg daily.     FOLLOW UP:  We will see her back in the office in one week for a follow-up  appointment and she is asked to call to schedule that appointment.  She will  need follow-up chemistries upon return for reevaluation of her hypokalemia.  She is to call if any problems would arise in the interim.      LC/MEDQ  D:  03/29/2005   T:  03/30/2005  Job:  [id number]   cc:   Windle Guard, M.D.  [address]  [place], [place] [address]  Fax: [phone]   Colleen Can. Deborah Chalk, M.D.  Fax: [phone]

## 2011-04-05 NOTE — Discharge Summary (Signed)
NAMECAMILE, [name] NO.:  [id number]   MEDICAL RECORD NO.:  [id number]          PATIENT TYPE:  OIB   LOCATION:  2010                         FACILITY:  [facility]   PHYSICIAN:  Colleen Can. Deborah Chalk, M.D.DATE OF BIRTH:  01-Mar-1935   DATE OF ADMISSION:  10/10/2004  DATE OF DISCHARGE:  10/11/2004                                 DISCHARGE SUMMARY   PRIMARY DISCHARGE DIAGNOSES:  Chest pain with subsequent elective cardiac  catheterization revealing normal left main coronary, 40-50% proximal left  circumflex, proximal left anterior descending with previously placed stent  is patent, there is a 40-50% narrowing in the first diagonal, at the second  diagonal there is a 90% stenosis with a 70-80% bifurcation lesion as well.  The stent that has previously been placed to the right coronary artery is  patent.  Left ventricular function is normal.  Subsequent stents were placed  to the left anterior descending/diagonal bifurcation with double Prowater  wires.  Subsequent 2.5 x 13 mm as well as 2.5 x 18 mm stents were placed  with overall satisfactory results obtained.  There was a 20-30% narrowing  distal to the end of the diagonal stent, in a large part secondary to vessel  size.  Overall, the procedure is felt to be satisfactory.   SECONDARY DISCHARGE DIAGNOSES:  1.  Known atherosclerotic cardiovascular disease, previous history of a      stent to the right coronary in June of 2000, subsequent stent to the      left anterior descending in July of 2000.  2.  Ongoing tobacco abuse.  3.  Plavix allergy.  4.  Recent bronchitis.  5.  Hyperlipidemia.  6.  Hypertension.  7.  Gastroesophageal reflux disease.   HISTORY OF PRESENT ILLNESS:  The patient is a 76 year old female who has  multiple medical problems, who was seen as a work-in appointment for a  constant episode of chest pain.  She refused admission to the hospital, but  elected instead for outpatient catheterization.  She  was started on Imdur.   Please see the dictated history and physical for further patient  presentation and profile.   LABORATORY DATA:  Cardiac MBs were negative as well as troponin.  EKG showed  no acute changes.  CBC was normal.  PT and PTT were unremarkable.  Chemistries were satisfactory.   HOSPITAL COURSE:  The patient was admitted electively.  She had been placed  previously on Imdur 60 mg.  Catheterization was performed on October 10, 2004.  That procedure was tolerated well without any known complications.  Subsequent stents were placed to the LAD/diagonal bifurcation with  satisfactory results obtained.  Postprocedure, she was transferred to 6500,  and plans were made for her to be discharged in the morning if deemed stable  on a.m. rounds.   DISCHARGE CONDITION:  Stable.   DISCHARGE MEDICINES:  She will resume all of her home medicines which  include:  1.  Imdur 60 mg a day.  2.  Atenolol 25 mg a day.  3.  Protonix 40 mg a day.  4.  Hydrochlorothiazide 25 mg a day.  5.  Aspirin daily.  6.  Crestor 5 mg a day.  7.  We will add in Ticlid 250 b.i.d. for the next six months in light of the      Plavix allergy.   Activity is to be light over the next few days.   She is strongly encouraged not to smoke.  We will see her back in the office  in one week.  She will need a followup CBC at that time.       LC/MEDQ  D:  10/10/2004  T:  10/11/2004  Job:  [id number]   cc:   Windle Guard, M.D.  [address]  [place], [place] [address]  Fax: [phone]

## 2011-04-05 NOTE — Discharge Summary (Signed)
NAMESOPHYA, VANBLARCOM NO.:  [id number]   MEDICAL RECORD NO.:  [id number]          PATIENT TYPE:  INP   LOCATION:  2035                         FACILITY:  [facility]   PHYSICIAN:  Colleen Can. Deborah Chalk, M.D.DATE OF BIRTH:  [birth year]   DATE OF ADMISSION:  10/13/2004  DATE OF DISCHARGE:  10/17/2004                                 DISCHARGE SUMMARY   PRIMARY DISCHARGE DIAGNOSES:  1.  Recurrent chest pain with subsequent elective repeat cardiac      catheterization on October 15, 2004 with normal left ventricular      function noted, persistent patency of the stents recently placed in the      left anterior descending proximally as well as at the bifurcation lesion      with the second diagonal, left anterior descending as well as      persistence patency of the stents in the right coronary; there is      residual disease, however, in the proximal left circumflex, first      diagonal, second diagonal vessel after the stent location.  All of the      residual stenoses appear to be moderate-to-moderately severe in nature      and do not appear to be the cause of unrelenting chest pain that Mrs.      Stanzione has been experiencing.  She will continue with medical      management.  2.  Atherosclerotic cardiovascular disease with previous stents placed to      the left anterior descending/diagonal bifurcation on October 10, 2004.      She has had prior history of stents to the right coronary in June of      2000, stent to the left anterior descending in July of 2000.  3.  Ongoing tobacco abuse.  4.  Plavix allergy.  5.  Intravenous contrast allergy.  6.  Recent bronchitis.  7.  Hyperlipidemia.  8.  Hypertension.  9.  Gastroesophageal reflux disease.  10. Chronic headaches.   HISTORY OF PRESENT ILLNESS:  The patient is a 76 year old white female who  has multiple medical problems.  She was just discharged from the hospital on  Thanksgiving after having bifurcating stents  placed to the LAD/diagonal.  She had recurrent chest pain on October 13, 2004, as well as arm pain.  She  presented back to the emergency room for further evaluation and was  subsequently seen and admitted.   Please see the dictated history and physical per Dr. Georga Hacking for  further patient presentation and profile.   LABORATORY DATA ON ADMISSION:  Her cardiac enzymes were negative.  Chemistries were satisfactory.   EKG showed no acute changes.   HOSPITAL COURSE:  The patient was admitted electively.  She was placed on IV  nitroglycerin.  Her enzymes were noted to be unremarkable.  It was felt that  we would need to proceed on with repeat cardiac catheterization to assess  patency of the previously placed stents.  Prior to repeat catheterization,  she did have a normal gallbladder ultrasound.   Cardiac catheterization was performed  on October 15, 2004; the results are  as noted above.  It is felt that with the complicated picture that she is  currently experiencing, medical management will still be the best option.  She was subsequently transferred to 2000 on October 16, 2004.  Her activity  has been increased and tolerated without problems.  She is complaining of  hematuria, however, the urine that we have actually inspected is currently  clear.  A urinalysis is pending at the time of discharge.  She has had some  slow oozing at the puncture site of the left groin, but overall is felt to  be a satisfactory candidate for discharge today.   DISCHARGE CONDITION:  Stable.   DISCHARGE MEDICINES:  She will resume all of her previous medicines which  include:  1.  Crestor 5 mg.  2.  Ticlid 250 mg b.i.d.  3.  Aspirin daily.  4.  Protonix 40 mg b.i.d.  5.  Atenolol 25 mg a day.  6.  Imdur 60 mg a day.  7.  Hydrochlorothiazide half a tab daily.   ACTIVITY:  Her activity will be light.   DIET:  Her diet is to be low-fat and heart-healthy.   WOUND CARE:  Pressure  dressing will be replaced to the left groin; she is to  replace that as needed and to call for any problems.   SPECIAL DISCHARGE INSTRUCTIONS:  She is strongly encouraged to not smoke.   FOLLOWUP:  We will see her back in the office in about 1-61 days, certainly  sooner if problems arise.       LC/MEDQ  D:  10/17/2004  T:  10/17/2004  Job:  [id number]   cc:   Windle Guard, M.D.  [address]  [place], [place] [address]  Fax: [phone]

## 2011-04-05 NOTE — Cardiovascular Report (Signed)
[place]  Patient:    [name], [name]                    MRN: [id number] Proc. Date: 12/09/00 Adm. Date:  [id number] Attending:  Eleanora [name] CC:         [name]. [name], M.D.                        Cardiac Catheterization  INDICATIONS:  [name] is a 76 year old female with known atherosclerotic cardiovascular disease with previous stent to the right coronary artery in June of 2000 as well as a stent to the left anterior descending in July of 2000.  Her last catheterization was in August of 2001 at which time she had normal LV function with residual stenosis in the second diagonal vessel and mild atherosclerosis in the left circumflex system.  Follow-up Cardiolite was basically unremarkable.  She comes in with recurrent episodes of burning discomfort in her left arm and a fullness in her chest and is referred for catheterization.  She has ongoing tobacco abuse, hypertension, hypercholesterolemia.  PROCEDURE:  Left heart catheterization with selective coronary angiography, left ventricular angiography with Perclose.  TYPE AND SITE OF ENTRY:  Percutaneous right femoral artery.  CATHETERS:  6 French 4 curved Judkins right and left coronary catheters, 6 French pigtail ventriculography catheter.  CONTRAST:  Omnipaque.  MEDICATIONS GIVEN:  Prior to the procedure; Benadryl 50 mg IV, Solu-Medrol 80 mg IV, and Tagamet 800 mg p.o.  During the procedure; Versed 1 mg IV, Ancef 1 gram IV.  COMMENTS:  The patient tolerated the procedure well.  HEMODYNAMIC DATA:  The aortic pressure was 149/59, LV was 149/31.  There was no aortic valve gradient noted on pullback.  On pullback, the left ventricular end diastolic pressure was 17.  ANGIOGRAPHIC DATA:  Left main coronary artery was normal.  Left anterior descending has a stent proximally and is widely patent.  There are two large diagonal vessels.  The second diagonal vessel has a  50% narrowing in it which is basically unchanged.  The continuation of the left anterior descending is relatively small and it is almost as if the distal left anterior descending bifurcates at the level of the second diagonal system.  Left circumflex.  The left circumflex is a moderately large vessel.  There are irregularities in the midportion of the left circumflex.  There is no greater than 30 to 40% narrowing.  Right coronary artery.  The right coronary artery has a 40 to 50% narrowing proximally.  The stent is widely patent.  Flow in the distal vessels is excellent and there is no significant residual disease.  Left ventricular angiogram.  Left ventricular angiogram was performed in the RAO position.  Overall cardiac size and silhouette are normal.  Left ventricular function is normal with ejection fraction of 60%.  There is no mitral regurgitation, intracardiac calcification, or intracavitary filling defect.  IMPRESSION: 1. Normal left ventricular function. 2. Persistent patency of the stents in the right coronary artery, left    anterior descending with residual 50% disease in a second diagonal vessel,    and 30 to 40% narrowings in a left circumflex, and 30 to 40% narrowings in    the proximal right coronary artery.  DISCUSSION:  It is felt that [name] is stable at this point in time and will be encouraged to modify cardiovascular risk factors as well as aggressively treat GI  symptoms. DD:  12/09/00 TD:  12/09/00 Job: [id number]

## 2011-04-10 ENCOUNTER — Encounter (HOSPITAL_COMMUNITY)
Admission: RE | Admit: 2011-04-10 | Discharge: 2011-04-10 | Disposition: A | Discharge status: Home / Self Care | Payer: Medicare Other | Source: Ambulatory Visit | Attending: Neurosurgery | Admitting: Neurosurgery

## 2011-04-10 LAB — CBC
Hemoglobin: 14.8 g/dL (ref 12.0–15.0)
MCH: 31.8 pg (ref 26.0–34.0)
MCV: 92.5 fL (ref 78.0–100.0)
Platelets: 220 10*3/uL (ref 150–400)
RBC: 4.65 MIL/uL (ref 3.87–5.11)
WBC: 11.9 10*3/uL — ABNORMAL HIGH (ref 4.0–10.5)

## 2011-04-10 LAB — BASIC METABOLIC PANEL
BUN: 18 mg/dL (ref 6–23)
CO2: 28 mEq/L (ref 19–32)
Chloride: 97 mEq/L (ref 96–112)
Creatinine, Ser: 1.1 mg/dL (ref 0.4–1.2)
GFR calc Af Amer: 59 mL/min — ABNORMAL LOW (ref >60)
Potassium: 4.4 mEq/L (ref 3.5–5.1)

## 2011-04-10 LAB — SURGICAL PCR SCREEN
MRSA, PCR: NEGATIVE
Staphylococcus aureus: NEGATIVE

## 2011-04-19 ENCOUNTER — Inpatient Hospital Stay (HOSPITAL_COMMUNITY)
Admission: RE | Admit: 2011-04-19 | Discharge: 2011-04-24 | DRG: 458 | Disposition: A | Discharge status: Home Health | Payer: Medicare Other | Source: Ambulatory Visit | Attending: Neurosurgery | Admitting: Neurosurgery

## 2011-04-19 ENCOUNTER — Inpatient Hospital Stay (HOSPITAL_COMMUNITY): Payer: Medicare Other

## 2011-04-19 DIAGNOSIS — M51379 Other intervertebral disc degeneration, lumbosacral region without mention of lumbar back pain or lower extremity pain: Secondary | ICD-10-CM | POA: Diagnosis present

## 2011-04-19 DIAGNOSIS — M5137 Other intervertebral disc degeneration, lumbosacral region: Secondary | ICD-10-CM | POA: Diagnosis present

## 2011-04-19 DIAGNOSIS — M4310 Spondylolisthesis, site unspecified: Secondary | ICD-10-CM | POA: Diagnosis present

## 2011-04-19 DIAGNOSIS — M4120 Other idiopathic scoliosis, site unspecified: Principal | ICD-10-CM | POA: Diagnosis present

## 2011-04-19 DIAGNOSIS — I10 Essential (primary) hypertension: Secondary | ICD-10-CM | POA: Diagnosis present

## 2011-04-19 DIAGNOSIS — J449 Chronic obstructive pulmonary disease, unspecified: Secondary | ICD-10-CM | POA: Diagnosis present

## 2011-04-19 DIAGNOSIS — J4489 Other specified chronic obstructive pulmonary disease: Secondary | ICD-10-CM | POA: Diagnosis present

## 2011-04-19 HISTORY — PX: LUMBAR SPINE SURGERY: SHX701

## 2011-04-19 LAB — TYPE AND SCREEN
ABO/RH(D): A POS
Antibody Screen: NEGATIVE

## 2011-04-23 NOTE — Op Note (Signed)
[name], [name] NO.:  [id number]  MEDICAL RECORD NO.:  [id number]           PATIENT TYPE:  I  LOCATION:  3109                         FACILITY:  [facility]  PHYSICIAN:  Danae Orleans. Venetia Maxon, M.D.  DATE OF BIRTH:  02-03-1935  DATE OF PROCEDURE:  04/19/2011 DATE OF DISCHARGE:                              OPERATIVE REPORT   PREOPERATIVE DIAGNOSES:  Scoliosis, spondylolisthesis, stenosis, degenerative disk disease, and radiculopathy L1-L5 levels with severe intractable back pain.  POSTOPERATIVE DIAGNOSES:  Scoliosis, spondylolisthesis, stenosis, degenerative disk disease, and radiculopathy L1-L5 levels with severe intractable back pain.  PROCEDURE: 1. Anterolateral decompression and fusion L1-2, L2-3, L3-4, L4-5     levels with PEEK interbody cages and allograft bone graft. 2. Percutaneous segmental instrumentation, L1-L5 bilaterally.  SURGEON:  Danae Orleans. Venetia Maxon, MD  ASSISTANT:  Georgiann Cocker, RN and Stefani Dama, MD  ANESTHESIA:  General endotracheal anesthesia.  ESTIMATED BLOOD LOSS:  100 mL.  COMPLICATIONS:  None.  DISPOSITION:  Recovery.  INDICATIONS:  [name] is a 76 year old woman with severe back and right greater than left lower extremity pain.  She has multilevel spondylosis, stenosis, scoliosis, and disk degeneration.  It was elected to take her surgery for decompression and fusion at these affected levels.  PROCEDURE:  Mr. Fiddler was brought to the operating room.  Following satisfactory and uncomplicated induction of general endotracheal anesthesia and placement of intravenous lines, and Foley catheter, the patient was placed in the left lateral position and she was positioned with her for ease with orthogonal x-ray in AP and lateral projections. The areas of planned incision was infiltrated with local lidocaine after she was prepped and draped in usual sterile fashion.  She was taped to the table in appropriate fashion.  Initially at  the L4-5 level, anterolateral decompression and fusion was performed with using the NuVasive retractor sequential dilators and retractors.  Neural mapping revealed no evidence of any close proximity of neural structures.  The interspace was opened and after a finger dissection, incision was made posteriorly, lateral incision was made and the probe was directed to fit through this so as to the disk space.  The interspace was then incised, disk material was removed in a piecemeal fashion.  Endplates were prepared for grafting.  After trial sizing under x-ray visualization, it was elected to use a 10-mm lordotic x 18 x 50-mm cage.  This was implanted and countersunk appropriately.  Attention was then turned to the L3-4 level where similar decompression was performed and at this level which was more collapsed an 8 mm x 50 x 18 standard cage was used packed with Osteocel plus and countersunk appropriately.  At the L2-3 level, a similarly sized implant was placed, but this was quite difficult to access.  Initially the cage fractured while trying to place it then subsequently we used a device to place the cage with good results and it appeared to be well positioned.  At the L1-2 level, a 45 x 8 x 18-mm cage was placed at each level had good restoration of disk height and lordotic curvature of the lumbar spine.  I  had initially planned on doing a right L2-3 foraminotomy; however, I felt that with the restoration of disk height, the patient would have sufficient decompression of the lateral recess, but this was felt not to be necessary at this point.  The incisions were then closed with 0-Vicryl sutures reapproximating the fascia, 2-0 and 3-0 Vicryl subcuticular stitches.  Wounds were dressed with Dermabond.  The patient was then turned to prone position on the operating table on chest rolls and using AP and lateral fluoroscopy throughout the case percutaneous pedicle screw fixation was performed  using 6.5 x 45 mm screws at L5, L4, and L3. The pedicles were quite attenuated at L2, I placed one pedicle screw on the left at L2 again.  This was 5.5 x 45 mm and at L1 the pedicles were extremely narrowed.  We placed 5.5 x 45 mm screws bilaterally.  Because of scoliotic curvature, a 150 mm rod was used on the left, a 140-mm rod was used on the right.  Locking caps were engaged and I then counter torqued appropriately.  Final x-ray demonstrated well-positioned interbody grafts and pedicle screw fixation L1-L5 levels without complicating features.  The incisions were then closed with 0 and 2-0 and 3-0 Vicryl sutures.  Sterile occlusive dressing was placed with Dermabond.  The patient was then extubated in the operating room, taken to the recovery room in a stable and satisfactory condition.  He tolerated the operation well.  Counts were correct at the end of the case.     Danae Orleans. Venetia Maxon, M.D.     JDS/MEDQ  D:  04/19/2011  T:  04/20/2011  Job:  [id number]  Electronically Signed by Maeola Harman M.D. on 04/23/2011 11:14:37 AM

## 2011-04-30 ENCOUNTER — Other Ambulatory Visit: Payer: Self-pay | Admitting: Cardiology

## 2011-05-01 ENCOUNTER — Other Ambulatory Visit: Payer: Self-pay | Admitting: *Deleted

## 2011-05-01 MED ORDER — ATENOLOL 50 MG PO TABS: ORAL_TABLET | ORAL | Status: DC

## 2011-05-01 NOTE — Telephone Encounter (Signed)
Fax received from pharmacy. Refill completed. Jodette Story Vanvranken RN  

## 2011-05-02 ENCOUNTER — Other Ambulatory Visit: Payer: Self-pay | Admitting: *Deleted

## 2011-05-02 NOTE — Telephone Encounter (Signed)
Duplicate order.Alfonso Ramus RN

## 2011-05-24 NOTE — Discharge Summary (Signed)
  NAMEDUYEN, BECKOM NO.:  [id number]  MEDICAL RECORD NO.:  [id number]  LOCATION:  3033                         FACILITY:  [facility]  PHYSICIAN:  Danae Orleans. Venetia Maxon, M.D.  DATE OF BIRTH:  Feb 21, 1935  DATE OF ADMISSION:  04/19/2011 DATE OF DISCHARGE:  04/24/2011                              DISCHARGE SUMMARY   REASON FOR ADMISSION:  [name] is a 76 year old woman with severe back and right greater than left lower extremity pain.  She has multilevel spondylosis, stenosis, scoliosis, and disk degeneration.  It was elected to take her to surgery for decompression and fusion at these affected levels.  The patient was admitted on same day's procedure basis on April 19, 2011 and underwent anterolateral decompression and fusion L1- 2, L2-3, L3-4, and L4-5 levels with peak interbody cages and allograft bone graft.  She then underwent at the same time percutaneous segmental instrumentation of L1-L5 bilaterally.  The patient has comorbidities of COPD and coronary artery disease.  The patient did well with her surgery, had no new deficits, was observed in ICU and did well.  There was gradually mobilized and then was discharged home in back brace with instructions to follow up in the office 3 weeks postoperatively.  DISCHARGE MEDICATIONS:  Oxycodone, Zantac, vitamin D3, Pravachol, Isorbid, hydrochlorothiazide, guaifenesin, calcium carbonate, atenolol, and albuterol inhaler.     Danae Orleans. Venetia Maxon, M.D.     JDS/MEDQ  D:  05/21/2011  T:  05/21/2011  Job:  [id number]  Electronically Signed by Maeola Harman M.D. on 05/24/2011 09:20:30 AM

## 2011-05-29 ENCOUNTER — Other Ambulatory Visit: Payer: Self-pay | Admitting: Cardiology

## 2011-05-29 NOTE — Telephone Encounter (Signed)
escribe medication per fax request  

## 2011-08-07 LAB — CBC
MCHC: 34.2
MCV: 92.9
Platelets: 191

## 2011-08-07 LAB — I-STAT 8, (EC8 V) (CONVERTED LAB)
Acid-Base Excess: 2
Chloride: 100
pCO2, Ven: 56.1 — ABNORMAL HIGH
pH, Ven: 7.331 — ABNORMAL HIGH

## 2011-08-07 LAB — DIFFERENTIAL
Basophils Relative: 0
Eosinophils Absolute: 0
Monocytes Relative: 7
Neutrophils Relative %: 83 — ABNORMAL HIGH

## 2011-08-07 LAB — POCT I-STAT CREATININE
Creatinine, Ser: 1.2
Operator id: 294341

## 2011-08-08 LAB — TROPONIN I: Troponin I: 0.03

## 2011-08-08 LAB — BASIC METABOLIC PANEL
BUN: 31 — ABNORMAL HIGH
CO2: 24
CO2: 28
Calcium: 8.2 — ABNORMAL LOW
Calcium: 8.6
Chloride: 99
Creatinine, Ser: 0.99
Creatinine, Ser: 1.15
Creatinine, Ser: 1.67 — ABNORMAL HIGH
GFR calc Af Amer: 37 — ABNORMAL LOW
GFR calc Af Amer: >60
GFR calc Af Amer: >60
GFR calc non Af Amer: 47 — ABNORMAL LOW
GFR calc non Af Amer: 55 — ABNORMAL LOW
GFR calc non Af Amer: >60
Glucose, Bld: 147 — ABNORMAL HIGH
Glucose, Bld: 184 — ABNORMAL HIGH
Potassium: 4.4
Sodium: 132 — ABNORMAL LOW
Sodium: 135

## 2011-08-08 LAB — DIFFERENTIAL
Basophils Relative: 0
Eosinophils Absolute: 0
Eosinophils Relative: 0
Monocytes Absolute: 0.1
Monocytes Relative: 0 — ABNORMAL LOW
Neutrophils Relative %: 98 — ABNORMAL HIGH

## 2011-08-08 LAB — BLOOD GAS, ARTERIAL
Acid-Base Excess: 4.3 — ABNORMAL HIGH
Drawn by: 145321
O2 Content: 3
TCO2: 23.2
pCO2 arterial: 34.7 — ABNORMAL LOW
pH, Arterial: 7.502 — ABNORMAL HIGH
pO2, Arterial: 58.1 — ABNORMAL LOW

## 2011-08-08 LAB — CK TOTAL AND CKMB (NOT AT ARMC)
CK, MB: 4.7 — ABNORMAL HIGH
CK, MB: 5 — ABNORMAL HIGH
Relative Index: 4.2 — ABNORMAL HIGH
Total CK: 113
Total CK: 95

## 2011-08-08 LAB — LIPID PANEL
HDL: 36 — ABNORMAL LOW
LDL Cholesterol: 68
Total CHOL/HDL Ratio: 3.3
Triglycerides: 76
VLDL: 15

## 2011-08-08 LAB — CBC
HCT: 41.4
Hemoglobin: 14.6
MCHC: 35.2
MCV: 91.4
RBC: 4.53

## 2011-08-08 LAB — CARDIAC PANEL(CRET KIN+CKTOT+MB+TROPI): Troponin I: 0.03

## 2011-10-31 ENCOUNTER — Other Ambulatory Visit: Payer: Self-pay | Admitting: Cardiology

## 2011-10-31 ENCOUNTER — Other Ambulatory Visit: Payer: Self-pay | Admitting: Internal Medicine

## 2011-11-25 ENCOUNTER — Emergency Department (HOSPITAL_COMMUNITY)
Admission: EM | Admit: 2011-11-25 | Discharge: 2011-11-25 | Disposition: A | Discharge status: Home / Self Care | Payer: Medicare Other | Attending: Emergency Medicine | Admitting: Emergency Medicine

## 2011-11-25 ENCOUNTER — Emergency Department (HOSPITAL_COMMUNITY): Payer: Medicare Other

## 2011-11-25 ENCOUNTER — Other Ambulatory Visit: Payer: Self-pay

## 2011-11-25 ENCOUNTER — Encounter (HOSPITAL_COMMUNITY): Payer: Self-pay | Admitting: Emergency Medicine

## 2011-11-25 DIAGNOSIS — K5730 Diverticulosis of large intestine without perforation or abscess without bleeding: Secondary | ICD-10-CM | POA: Insufficient documentation

## 2011-11-25 DIAGNOSIS — K5900 Constipation, unspecified: Secondary | ICD-10-CM | POA: Insufficient documentation

## 2011-11-25 DIAGNOSIS — R109 Unspecified abdominal pain: Secondary | ICD-10-CM | POA: Insufficient documentation

## 2011-11-25 DIAGNOSIS — N200 Calculus of kidney: Secondary | ICD-10-CM | POA: Insufficient documentation

## 2011-11-25 DIAGNOSIS — I498 Other specified cardiac arrhythmias: Secondary | ICD-10-CM | POA: Insufficient documentation

## 2011-11-25 LAB — URINALYSIS, ROUTINE W REFLEX MICROSCOPIC
Bilirubin Urine: NEGATIVE
Ketones, ur: NEGATIVE mg/dL
Leukocytes, UA: NEGATIVE
Nitrite: NEGATIVE
Protein, ur: NEGATIVE mg/dL
Urobilinogen, UA: 0.2 mg/dL (ref 0.0–1.0)
pH: 6.5 (ref 5.0–8.0)

## 2011-11-25 LAB — COMPREHENSIVE METABOLIC PANEL
Albumin: 4 g/dL (ref 3.5–5.2)
Alkaline Phosphatase: 54 U/L (ref 39–117)
BUN: 16 mg/dL (ref 6–23)
CO2: 29 mEq/L (ref 19–32)
Chloride: 98 mEq/L (ref 96–112)
Creatinine, Ser: 1.13 mg/dL — ABNORMAL HIGH (ref 0.50–1.10)
GFR calc Af Amer: 54 mL/min — ABNORMAL LOW (ref >90)
GFR calc non Af Amer: 47 mL/min — ABNORMAL LOW (ref >90)
Glucose, Bld: 109 mg/dL — ABNORMAL HIGH (ref 70–99)
Potassium: 3.9 mEq/L (ref 3.5–5.1)
Total Bilirubin: 0.3 mg/dL (ref 0.3–1.2)

## 2011-11-25 LAB — CBC
HCT: 44.7 % (ref 36.0–46.0)
Hemoglobin: 15.2 g/dL — ABNORMAL HIGH (ref 12.0–15.0)
MCHC: 34 g/dL (ref 30.0–36.0)
RBC: 4.86 MIL/uL (ref 3.87–5.11)

## 2011-11-25 LAB — DIFFERENTIAL
Basophils Relative: 0 % (ref 0–1)
Lymphs Abs: 2 10*3/uL (ref 0.7–4.0)
Monocytes Absolute: 0.7 10*3/uL (ref 0.1–1.0)
Monocytes Relative: 8 % (ref 3–12)
Neutro Abs: 5.7 10*3/uL (ref 1.7–7.7)
Neutrophils Relative %: 68 % (ref 43–77)

## 2011-11-25 LAB — OCCULT BLOOD, POC DEVICE: Fecal Occult Bld: NEGATIVE

## 2011-11-25 LAB — LIPASE, BLOOD: Lipase: 36 U/L (ref 11–59)

## 2011-11-25 MED ORDER — MORPHINE SULFATE 4 MG/ML IJ SOLN: 4.0000 mg | Freq: Once | INTRAMUSCULAR | Status: DC

## 2011-11-25 MED ORDER — SODIUM CHLORIDE 0.9 % IV BOLUS (SEPSIS)
500.0000 mL | Freq: Once | INTRAVENOUS | Status: AC
  Administered 2011-11-25: 500 mL via INTRAVENOUS

## 2011-11-25 MED ORDER — ENEMA DISPOSABLE 19-7 GM/118ML RE ENEM: 1.0000 | ENEMA | Freq: Once | RECTAL | Status: DC

## 2011-11-25 MED ORDER — MORPHINE SULFATE 4 MG/ML IJ SOLN
4.0000 mg | Freq: Once | INTRAMUSCULAR | Status: AC
  Administered 2011-11-25: 4 mg via INTRAVENOUS
  Filled 2011-11-25: qty 1

## 2011-11-25 MED ORDER — ASPIRIN 81 MG PO CHEW
324.0000 mg | CHEWABLE_TABLET | Freq: Once | ORAL | Status: AC
  Administered 2011-11-25: 324 mg via ORAL
  Filled 2011-11-25: qty 1

## 2011-11-25 NOTE — ED Notes (Signed)
Patient transported to X-ray 

## 2011-11-25 NOTE — ED Notes (Signed)
NWG:[id number]<ZH> Expected date:11/25/11<BR> Expected time:12:16 PM<BR> Means of arrival:Ambulance<BR> Comments:<BR> EMS 11 GC - abd pain/hx of constipation

## 2011-11-25 NOTE — ED Provider Notes (Signed)
History     CSN: [id number]  Arrival date & time 11/25/11  1219   First MD Initiated Contact with Patient 11/25/11 1227      No chief complaint on file.   (Consider location/radiation/quality/duration/timing/severity/associated sxs/prior treatment) HPI Patient is a 77 yo F who presents complaining of diffuse abdominal pain for the past 2 weeks that began after starting narcotic pain medication for right-sided back pain prescribed by her neurosurgeon.  Patient denies incontinence.  She endorses occasional "pins and needles" sensations in her legs but reports that this is not a new finding. She denies nausea, vomiting, diarrhea, fevers, urinary symptoms, bloody or black tarry stools.  Her last BM was this morning and her only abdominal surgery was remote TAH.  Patient denies any chest pain or shortness of breath but does have remote history of CAD with 5 stents.  She has tried stool softener for her symptoms without relief and reports her pain is 7/10. History reviewed. No pertinent past medical history.  No past surgical history on file.  No family history on file.  History  Substance Use Topics  . Smoking status: Not on file  . Smokeless tobacco: Not on file  . Alcohol Use: Not on file    OB History    Grav Para Term Preterm Abortions TAB SAB Ect Mult Living                  Review of Systems  Constitutional: Negative.   HENT: Negative.   Eyes: Negative.   Cardiovascular: Negative.   Gastrointestinal: Positive for abdominal pain.  Genitourinary: Negative.   Musculoskeletal: Positive for back pain.  Skin: Negative.   Neurological: Negative.   Hematological: Negative.   Psychiatric/Behavioral: Negative.   All other systems reviewed and are negative.    Allergies  Iohexol  Home Medications   Current Outpatient Rx  Name Route Sig Dispense Refill  . ASPIRIN EC 81 MG PO TBEC Oral Take 81 mg by mouth daily.      . ATENOLOL 50 MG PO TABS  TAKE ONE-HALF TABLET BY MOUTH  EVERY DAY 30 tablet 1  . CALCIUM CARBONATE 600 MG PO TABS Oral Take 600 mg by mouth daily.      Marland Kitchen VITAMIN D 1000 UNITS PO TABS Oral Take 1,000 Units by mouth daily.      Marland Kitchen HYDROCHLOROTHIAZIDE 25 MG PO TABS Oral Take 25 mg by mouth daily.      . ISOSORBIDE DINITRATE 20 MG PO TABS Oral Take 60 mg by mouth 2 (two) times daily.      Marland Kitchen PRAVASTATIN SODIUM 40 MG PO TABS Oral Take 40 mg by mouth daily.      Marland Kitchen RANITIDINE HCL 300 MG PO TABS Oral Take 300 mg by mouth at bedtime.      Marland Kitchen NITROSTAT 0.4 MG SL SUBL  TAKE AS DIRECTED 25 each 11  . ENEMA DISPOSABLE 19-7 GM/118ML RE ENEM Rectal Place 1 enema rectally once. 1 Bottle 0    BP 171/104  Pulse 55  Temp(Src) 97.7 F (36.5 C) (Oral)  Resp 15  SpO2 95%  Physical Exam  Nursing note and vitals reviewed. Constitutional: She is oriented to person, place, and time. She appears well-developed and well-nourished. No distress.  HENT:  Head: Normocephalic and atraumatic.  Eyes: Conjunctivae and EOM are normal. Pupils are equal, round, and reactive to light.  Neck: Normal range of motion.  Cardiovascular: Normal rate, regular rhythm, normal heart sounds and intact distal pulses.  Exam  reveals no gallop and no friction rub.   No murmur heard. Pulmonary/Chest: Effort normal and breath sounds normal. No respiratory distress. She has no wheezes. She has no rales.  Abdominal: Soft. Bowel sounds are normal. She exhibits no distension. There is tenderness. There is no rebound and no guarding.       Mild diffuse TTP  Genitourinary: Guaiac negative stool.       No fecal impaction  Musculoskeletal: Normal range of motion.  Neurological: She is alert and oriented to person, place, and time. No cranial nerve deficit. She exhibits normal muscle tone. Coordination normal.  Skin: Skin is warm and dry. No rash noted.  Psychiatric: She has a normal mood and affect.    ED Course  Procedures (including critical care time)  Labs Reviewed  CBC - Abnormal; Notable  for the following:    Hemoglobin 15.2 (*)    All other components within normal limits  COMPREHENSIVE METABOLIC PANEL - Abnormal; Notable for the following:    Glucose, Bld 109 (*)    Creatinine, Ser 1.13 (*)    GFR calc non Af Amer 47 (*)    GFR calc Af Amer 54 (*)    All other components within normal limits  DIFFERENTIAL  LIPASE, BLOOD  URINALYSIS, ROUTINE W REFLEX MICROSCOPIC  LACTIC ACID, PLASMA  POCT I-STAT TROPONIN I  OCCULT BLOOD, POC DEVICE  POCT OCCULT BLOOD STOOL, DEVICE  I-STAT TROPONIN I   Ct Abdomen Pelvis W Contrast  11/25/2011  *RADIOLOGY REPORT*  Clinical Data:  Abdominal pain.  Suspected small bowel obstruction. IV contrast allergy.  CT ABDOMEN AND PELVIS WITHOUT CONTRAST  Technique:  Multidetector CT imaging of the abdomen and pelvis was performed following the standard protocol without intravenous contrast.  Comparison:  09/04/2010  Findings:  No evidence of dilated small bowel loops or small bowel obstruction.  Diverticulosis is again seen involving the descending and sigmoid colon, however there is no evidence of diverticulitis. No other inflammatory process or abnormal fluid collections are identified within the abdomen or pelvis.  Prior hysterectomy noted. Adnexa are unremarkable.  Bilateral renal parenchymal scarring is again seen, left side greater than right.  A tiny 1-2 mm calculus is again seen in the upper pole of the right kidney.  This is stable.  There is no evidence of ureteral calculi or hydronephrosis.  The other abdominal parenchymal organs have a normal appearance on this noncontrast study.  Gallbladder is unremarkable.  No soft tissue masses or lymphadenopathy identified.  IMPRESSION:  1.  No evidence of small bowel obstruction or other acute findings. 2.  Diverticulosis.  No radiographic evidence of diverticulitis. 3.  Nonobstructing right nephrolithiasis and bilateral renal parenchymal scarring.  Original Report Authenticated By: Danae Orleans, M.D.   Dg  Abd Acute W/chest  11/25/2011  *RADIOLOGY REPORT*  Clinical Data: Lower abdominal pain.  Evaluate for obstruction.  ACUTE ABDOMEN SERIES (ABDOMEN 2 VIEW & CHEST 1 VIEW)  Comparison: Chest radiograph 10/06/2010  Findings: The chest radiograph demonstrates prominent interstitial lung markings that appear to represent chronic changes.  There is no focal airspace disease.  Heart size is stable.  The trachea is midline.  No evidence for free air.  Pedicle screw fixation of the lumbar spine.  There are mildly dilated loops of small bowel in the right abdomen with air-fluid levels.  No evidence to suggest free air.  There is gas and stool in the rectal region.  Possible 5 mm calcification in the right kidney.  IMPRESSION:  Mildly dilated loops of small bowel in the right abdomen with air- fluid levels.  Findings could represent an early or partial small bowel obstruction.  Possible right kidney stone.  Prominent interstitial lung markings appear chronic.  Original Report Authenticated By: Richarda Overlie, M.D.    Date: 11/25/2011  Rate: 53  Rhythm: sinus bradycardia  QRS Axis: normal  Intervals: normal  ST/T Wave abnormalities: nonspecific T wave changes  Conduction Disutrbances:none  Narrative Interpretation:   Old EKG Reviewed: unchanged   1. Abdominal pain   2. Constipation       MDM  Patient was evaluated and had laboratory work-up for possible atypical chest pain as well as abdominal pain.  CBC, CMP, UA, and lipase were unremarkable.  ECG and TNI were also unremarkable.  Patient was treated with morphine for her pain and this resolved.  AAS was concerning for SBO though patient history was not truly consistent with this.  CT with po only contrast was performed based on this.  IC contrast could not be used based on dye allergy.  This showed some right-sided nephrolithiasis but was otherwise unremarkable.  The patient had improvement of her symptoms.  We discussed that I would not give her anything stronger  for her pain than what she was already getting.  Though she had nephrolithiasis there were no obstructing or sizeable stones.  She was told this.  I also recommended fleet's enema at home.  Patient was also cautioned about this relation of constipation and narcotic pain medications.  Patient and family stated understanding and patient was discharged in good condition.        Cyndra Numbers, MD 11/25/11 2157

## 2012-01-14 ENCOUNTER — Ambulatory Visit: Payer: Medicare Other | Attending: Neurosurgery | Admitting: Physical Therapy

## 2012-01-14 DIAGNOSIS — M545 Low back pain, unspecified: Secondary | ICD-10-CM | POA: Insufficient documentation

## 2012-01-14 DIAGNOSIS — IMO0001 Reserved for inherently not codable concepts without codable children: Secondary | ICD-10-CM | POA: Insufficient documentation

## 2012-01-14 DIAGNOSIS — M25659 Stiffness of unspecified hip, not elsewhere classified: Secondary | ICD-10-CM | POA: Insufficient documentation

## 2012-01-14 DIAGNOSIS — R5381 Other malaise: Secondary | ICD-10-CM | POA: Insufficient documentation

## 2012-01-21 ENCOUNTER — Ambulatory Visit: Payer: Medicare Other | Admitting: Rehabilitation

## 2012-01-22 ENCOUNTER — Other Ambulatory Visit: Payer: Self-pay | Admitting: Family Medicine

## 2012-01-23 ENCOUNTER — Encounter: Payer: Medicare Other | Admitting: Rehabilitation

## 2012-01-27 ENCOUNTER — Ambulatory Visit
Admission: RE | Admit: 2012-01-27 | Discharge: 2012-01-27 | Disposition: A | Discharge status: Home / Self Care | Payer: Medicare Other | Source: Ambulatory Visit | Attending: Family Medicine | Admitting: Family Medicine

## 2012-01-27 MED ORDER — IOHEXOL 300 MG/ML  SOLN
100.0000 mL | Freq: Once | INTRAMUSCULAR | Status: AC | PRN
  Administered 2012-01-27: 100 mL via INTRAVENOUS

## 2012-01-28 ENCOUNTER — Ambulatory Visit: Payer: Medicare Other | Attending: Neurosurgery | Admitting: Rehabilitation

## 2012-01-28 DIAGNOSIS — M545 Low back pain, unspecified: Secondary | ICD-10-CM | POA: Insufficient documentation

## 2012-01-28 DIAGNOSIS — R5381 Other malaise: Secondary | ICD-10-CM | POA: Insufficient documentation

## 2012-01-28 DIAGNOSIS — M25659 Stiffness of unspecified hip, not elsewhere classified: Secondary | ICD-10-CM | POA: Insufficient documentation

## 2012-01-28 DIAGNOSIS — IMO0001 Reserved for inherently not codable concepts without codable children: Secondary | ICD-10-CM | POA: Insufficient documentation

## 2012-02-04 ENCOUNTER — Encounter: Payer: Self-pay | Admitting: Gastroenterology

## 2012-02-04 ENCOUNTER — Ambulatory Visit: Payer: Medicare Other | Admitting: Rehabilitation

## 2012-02-04 ENCOUNTER — Ambulatory Visit (INDEPENDENT_AMBULATORY_CARE_PROVIDER_SITE_OTHER): Payer: Medicare Other | Admitting: Gastroenterology

## 2012-02-04 VITALS — BP 98/52 | HR 60 | Ht 65.0 in | Wt 164.0 lb

## 2012-02-04 DIAGNOSIS — R1310 Dysphagia, unspecified: Secondary | ICD-10-CM

## 2012-02-04 DIAGNOSIS — K219 Gastro-esophageal reflux disease without esophagitis: Secondary | ICD-10-CM

## 2012-02-04 DIAGNOSIS — K6289 Other specified diseases of anus and rectum: Secondary | ICD-10-CM

## 2012-02-04 MED ORDER — OMEPRAZOLE 20 MG PO CPDR: 20.0000 mg | DELAYED_RELEASE_CAPSULE | Freq: Every day | ORAL | Status: DC

## 2012-02-04 MED ORDER — HYDROCORTISONE ACETATE 25 MG RE SUPP: 25.0000 mg | Freq: Two times a day (BID) | RECTAL | Status: DC

## 2012-02-04 NOTE — Assessment & Plan Note (Signed)
Begin omeprazole 20 mg daily 

## 2012-02-04 NOTE — Assessment & Plan Note (Addendum)
The patient is a poor historian but it sounds like pain that could be ascribed to hemorrhoids or, less likely, an anal fissure. Rectal spasm is also a possibility.  Prior colonoscopy  for evaluation of rectal pain was negative.  Recommendations #1 Anusol HC suppository

## 2012-02-04 NOTE — Patient Instructions (Signed)
Your Endoscopy is scheduled on 02/05/2012 at 9 am  Separate instructions have been given  Esophageal Dilatation The esophagus is the long, narrow tube which carries food and liquid from the mouth to the stomach. Esophageal dilatation is the technique used to stretch a blocked or narrowed portion of the esophagus. This procedure is used when a part of the esophagus has become so narrow that it becomes difficult, painful or even impossible to swallow. This is generally an uncomplicated form of treatment. When this is not successful, chest surgery may be required. This is a much more extensive form of treatment with a longer recovery time. CAUSES  Some of the more common causes of blockage or strictures of the esophagus are:  Narrowing from longstanding inflammation (soreness and redness) of the lower esophagus. This comes from the constant exposure of the lower esophagus to the acid which bubbles up from the stomach. Over time this causes scarring and narrowing of the lower esophagus.   Hiatal hernia in which a small part of the stomach bulges (herniates) up through the diaphragm. This can cause a gradual narrowing of the end of the esophagus.   Schatzki's Ring is a narrow ring of benign (non-cancerous) fibrous tissue which constricts the lower esophagus. The reason for this is not known.   Scleroderma is a connective tissue disorder that affects the esophagus and makes swallowing difficult.   Achalasia is an absence of nerves to the lower esophagus and to the esophageal sphincter. This is the circular muscle between the stomach and esophagus that relaxes to allow food into the stomach. After swallowing, it contracts to keep food in the stomach. This absence of nerves may be congenital (present since birth). This can cause irregular spasms of the lower esophageal muscle. This spasm does not open up to allow food and fluid through. The result is a persistent blockage with subsequent slow trickling of the  esophageal contents into the stomach.   Strictures may develop from swallowing materials which damage the esophagus. Some examples are strong acids or alkalis such as lye.   Growths such as benign (non-cancerous) and malignant (cancerous) tumors can block the esophagus.   Heredity (present since birth) causes.  DIAGNOSIS  Your caregiver often suspects this problem by taking a medical history. They will also do a physical exam. They can then prove their suspicions using X-rays and endoscopy. Endoscopy is an exam in which a tube like a small flexible telescope is used to look at your esophagus.  TREATMENT There are different stretching (dilating) techniques which can be used. Simple bougie dilatation may be done in the office. This usually takes only a couple minutes. A numbing (anesthetic) spray of the throat is used. Endoscopy, when done, is done in an endoscopy suite, under mild sedation. When fluoroscopy is used, the procedure is performed in X-ray. Other techniques require a little longer time. Recovery is usually quick. There is no waiting time to begin eating and drinking to test success of the treatment. Following are some of the methods used. Narrowing of the esophagus is treated by making it bigger. Commonly this is a mechanical problem which can be treated with stretching. This can be done in different ways. Your caregiver will discuss these with you. Some of the means used are:  A series of graduated (increasing thickness) flexible dilators can be used. These are weighted tubes passed through the esophagus into the stomach. The tubes used become progressively larger until the desired stretched size is reached. Graduated dilators  are a simple and quick way of opening the esophagus. No visualization is required.   Another method is the use of endoscopy to place a flexible wire across the stricture. The endoscope is removed and the wire left in place. A dilator with a hole through it from end  to end is guided down the esophagus and across the stricture. One or more of these dilators are passed over the wire. At the end of the exam, the wire is removed. This type of treatment may be performed in the X-ray department under fluoroscopy. An advantage of this procedure is the examiner is visualizing the end opening in the esophagus.   Stretching of the esophagus may be done using balloons. Deflated balloons are placed through the endoscope and across the stricture. This type of balloon dilatation is often done at the time of endoscopy or fluoroscopy. Flexible endoscopy allows the examiner to directly view the stricture. A balloon is inserted in the deflated form into the area of narrowing. It is then inflated with air to a certain pressure that is pre-set for a given circumference. When inflated, it becomes sausage shaped, stretched, and makes the stricture larger.   Achalasia requires a longer larger balloon-type dilator. This is frequently done under X-ray control. In this situation, the spastic muscle fibers in the lower esophagus are stretched.  All of the above procedures make the passage of food and water into the stomach easier. They also make it easier for stomach contents to reflux back into the esophagus. Special medications may be used following the procedure to help prevent further stricturing. Proton-pump inhibitor medications are good at decreasing the amount of acid in the stomach juice. When stomach juice refluxes into the esophagus, the juice is no longer as acidic and is less likely to burn or scar the esophagus. RISKS AND COMPLICATIONS Esophageal dilatation is usually performed effectively and without problems. Some complications that can occur are:  A small amount of bleeding almost always happens where the stretching takes place. If this is too excessive it may require more aggressive treatment.   An uncommon complication is perforation (making a hole) of the esophagus. The  esophagus is thin. It is easy to make a hole in it. If this happens, an operation may be necessary to repair this.   A small, undetected perforation could lead to an infection in the chest. This can be very serious.  HOME CARE INSTRUCTIONS   If you received sedation for your procedure, do not drive, make important decisions, or perform any activities requiring your full coordination. Do not drink alcohol, take sedatives, or use any mind altering chemicals unless instructed by your caregiver.   You may use throat lozenges or warm salt water gargles if you have throat discomfort   You can begin eating and drinking normally on return home unless instructed otherwise. Do not purposely try to force large chunks of food down to test the benefits of your procedure.   Mild discomfort can be eased with sips of ice water.   Medications for discomfort may or may not be needed.  SEEK IMMEDIATE MEDICAL CARE IF:   You begin vomiting up blood.   You develop black tarry stools   You develop chills or an unexplained temperature of over 101 F (38.3 C)   You develop chest or abdominal pain.   You develop shortness of breath or feel lightheaded or faint.   Your swallowing is becoming more painful, difficult, or you are unable to swallow.  MAKE SURE YOU:   Understand these instructions.   Will watch your condition.   Will get help right away if you are not doing well or get worse.  Document Released: 12/26/2005 Document Revised: 10/24/2011 Document Reviewed: 02/12/2006 [facility] Patient Information 2012 [place], [place].

## 2012-02-04 NOTE — Progress Notes (Signed)
History of Present Illness: [name] is a 77 year old white female referred at the request of Dr. Jeannetta Nap for evaluation of rectal and abdominal pain. She complains of spontaneous pain in the rectum and pain after a bowel movement for which he takes pain medicine. Colonoscopy in October, 2011, for evaluation of rectal pain, demonstrated areas of submucosal erythema in the proximal sigmoid extending to the descending colon, and diverticulosis. Biopsies were unremarkable.  She complains of what sounds like pyrosis and she is having dysphagia to solids.    Past Medical History  Diagnosis Date  . Hyperlipemia   . Unspecified essential hypertension   . GERD (gastroesophageal reflux disease)   . CAD (coronary artery disease)    Past Surgical History  Procedure Date  . Vaginal hysterectomy   . Coronary angioplasty with stent placement   . Lumbar spine surgery     Rods, screws and cages   family history includes Bone cancer in her mother and Glaucoma in her father. Current Outpatient Prescriptions  Medication Sig Dispense Refill  . aspirin EC 81 MG tablet Take 81 mg by mouth daily.        . cholecalciferol (VITAMIN D) 1000 UNITS tablet Take 1,000 Units by mouth daily.        . hydrochlorothiazide (HYDRODIURIL) 25 MG tablet Take 25 mg by mouth daily.        Marland Kitchen HYDROcodone-acetaminophen (NORCO) 10-325 MG per tablet Take 1 tablet by mouth every 4 (four) hours as needed.      . isosorbide dinitrate (ISORDIL) 20 MG tablet Take 60 mg by mouth 2 (two) times daily.        Marland Kitchen NITROSTAT 0.4 MG SL tablet TAKE AS DIRECTED  25 each  11  . promethazine (PHENERGAN) 25 MG tablet Take 25 mg by mouth every 4 (four) hours as needed.      . propranolol (INDERAL) 40 MG tablet Take 40 mg by mouth 2 (two) times daily.      . Sodium Phosphates (ENEMA DISPOSABLE) 19-7 GM/118ML ENEM Place 1 enema rectally once.  1 Bottle  0   Allergies as of 02/04/2012 - Review Complete 02/04/2012  Allergen Reaction Noted  . Iohexol   08/28/2010    reports that she has been smoking.  She has never used smokeless tobacco. She reports that she does not drink alcohol or use illicit drugs.     Review of Systems: She is completely of pain below the knees bilaterally and pain in the back of her head. Pertinent positive and negative review of systems were noted in the above HPI section. All other review of systems were otherwise negative.  Vital signs were reviewed in today's medical record Physical Exam: General: Well developed , well nourished, no acute distress Head: Normocephalic and atraumatic Eyes:  sclerae anicteric, EOMI Ears: Normal auditory acuity Mouth: No deformity or lesions Neck: Supple, no masses or thyromegaly Lungs: Clear throughout to auscultation Heart: Regular rate and rhythm; no murmurs, rubs or bruits Abdomen: Soft,  and non distended. No masses, hepatosplenomegaly or hernias noted. Normal Bowel sounds. There is minimal tenderness to palpation in the left lower quad Rectal: There are no abnormalities of the rectum on visual inspection Musculoskeletal: Symmetrical with no gross deformities  Skin: No lesions on visible extremities Pulses:  Normal pulses noted Extremities: No clubbing, cyanosis, edema or deformities noted Neurological: Alert oriented x 4, grossly nonfocal Cervical Nodes:  No significant cervical adenopathy Inguinal Nodes: No significant inguinal adenopathy Psychological:  Alert and cooperative. Normal  mood and affect

## 2012-02-04 NOTE — Assessment & Plan Note (Signed)
Rule out peptic esophageal stricture.  Recommendations #1 upper endoscopy with dilatation as indicated 

## 2012-02-05 ENCOUNTER — Encounter: Payer: Self-pay | Admitting: Gastroenterology

## 2012-02-05 ENCOUNTER — Ambulatory Visit (AMBULATORY_SURGERY_CENTER): Payer: Medicare Other | Admitting: Gastroenterology

## 2012-02-05 DIAGNOSIS — K222 Esophageal obstruction: Secondary | ICD-10-CM

## 2012-02-05 DIAGNOSIS — K269 Duodenal ulcer, unspecified as acute or chronic, without hemorrhage or perforation: Secondary | ICD-10-CM

## 2012-02-05 DIAGNOSIS — R1310 Dysphagia, unspecified: Secondary | ICD-10-CM

## 2012-02-05 DIAGNOSIS — K219 Gastro-esophageal reflux disease without esophagitis: Secondary | ICD-10-CM

## 2012-02-05 MED ORDER — SODIUM CHLORIDE 0.9 % IV SOLN: 500.0000 mL | INTRAVENOUS | Status: DC

## 2012-02-05 NOTE — Progress Notes (Signed)
Patient did not experience any of the following events: a burn prior to discharge; a fall within the facility; wrong site/side/patient/procedure/implant event; or a hospital transfer or hospital admission upon discharge from the facility. (G8907) Patient did not have preoperative order for IV antibiotic SSI prophylaxis. (G8918)  

## 2012-02-05 NOTE — Op Note (Signed)
[facility] [address] [place], [place]  [address]  ENDOSCOPY PROCEDURE REPORT  PATIENT:  [name], [name]  MR#:  [id number] BIRTHDATE:  May 09, 1935, 77 yrs. old  GENDER:  female  ENDOSCOPIST:  Barbette Hair. Arlyce Dice, MD Referred by:  Windle Guard, M.D.  PROCEDURE DATE:  02/05/2012 PROCEDURE:  EGD with biopsy, 43239, Maloney Dilation of Esophagus ASA CLASS:  Class II INDICATIONS:  dysphagia  MEDICATIONS:   MAC sedation, administered by CRNA propofol 100mg IV, glycopyrrolate (Robinal) 0.2 mg IV, 0.6cc simethancone 0.6 cc PO TOPICAL ANESTHETIC:  DESCRIPTION OF PROCEDURE:   After the risks and benefits of the procedure were explained, informed consent was obtained.  The LB GIF-H180 [id number] endoscope was introduced through the mouth and advanced to the third portion of the duodenum.  The instrument was slowly withdrawn as the mucosa was fully examined. <<PROCEDUREIMAGES>>  Multiple ulcers were found in the bulb of the duodenum. Multiple superficial ulcers. Bxs taken (see image2).  A stricture was found at the gastroesophageal junction (see image1). Early stricture Dilation with maloney dilator 18mm Minimal resistance; no heme Otherwise the examination was normal (see image3 and image5). Retroflexed views revealed no abnormalities.    The scope was then withdrawn from the patient and the procedure completed.  COMPLICATIONS:  None  ENDOSCOPIC IMPRESSION: 1) Ulcers, multiple in the bulb of duodenum 2) Stricture at the gastroesophageal junction 3) Otherwise normal examination RECOMMENDATIONS: 1) Call office next 2-3 days to schedule an office appointment for 1 month  ______________________________ Barbette Hair. Arlyce Dice, MD  CC:  n. eSIGNED:   Barbette Hair. Daxton Nydam at 02/05/2012 09:50 AM  Lucilla Edin, [phone]

## 2012-02-05 NOTE — Patient Instructions (Addendum)
YOU HAD AN ENDOSCOPIC PROCEDURE TODAY AT THE [facility]: Refer to the procedure report that was given to you for any specific questions about what was found during the examination.  If the procedure report does not answer your questions, please call your gastroenterologist to clarify.  If you requested that your care partner not be given the details of your procedure findings, then the procedure report has been included in a sealed envelope for you to review at your convenience later.  YOU SHOULD EXPECT: Some feelings of bloating in the abdomen. Passage of more gas than usual.  Walking can help get rid of the air that was put into your GI tract during the procedure and reduce the bloating. If you had a lower endoscopy (such as a colonoscopy or flexible sigmoidoscopy) you may notice spotting of blood in your stool or on the toilet paper. If you underwent a bowel prep for your procedure, then you may not have a normal bowel movement for a few days.    ACTIVITY: Your care partner should take you home directly after the procedure.  You should plan to take it easy, moving slowly for the rest of the day.  You can resume normal activity the day after the procedure however you should NOT DRIVE or use heavy machinery for 24 hours (because of the sedation medicines used during the test).    SYMPTOMS TO REPORT IMMEDIATELY: A gastroenterologist can be reached at any hour.  During normal business hours, 8:30 AM to 5:00 PM Monday through Friday, call [phone].  After hours and on weekends, please call the GI answering service at [phone] who will take a message and have the physician on call contact you.   Following lower endoscopy (colonoscopy or flexible sigmoidoscopy):  Excessive amounts of blood in the stool  Significant tenderness or worsening of abdominal pains  Swelling of the abdomen that is new, acute  Fever of 100F or higher  Following upper endoscopy (EGD)  Vomiting of  blood or coffee ground material  New chest pain or pain under the shoulder blades  Painful or persistently difficult swallowing  New shortness of breath  Fever of 100F or higher  Black, tarry-looking stools  FOLLOW UP: If any biopsies were taken you will be contacted by phone or by letter within the next 1-3 weeks.  Call your gastroenterologist if you have not heard about the biopsies in 3 weeks.  Our staff will call the home number listed on your records the next business day following your procedure to check on you and address any questions or concerns that you may have at that time regarding the information given to you following your procedure. This is a courtesy call and so if there is no answer at the home number and we have not heard from you through the emergency physician on call, we will assume that you have returned to your regular daily activities without incident.  SIGNATURES/CONFIDENTIALITY: You and/or your care partner have signed paperwork which will be entered into your electronic medical record.  These signatures attest to the fact that that the information above on your After Visit Summary has been reviewed and is understood.  Full responsibility of the confidentiality of this discharge information lies with you and/or your care-partner.    Information on esophageal dilatation & duodenal ulcer given to you today   YOU SHOULD FOLLOW DILATATION DIET GIVEN TO YOU TODAY & RESUME REGULAR DIET TOMORROW IF YOUR THROAT FEELS NORMAL.  CALL  DR. Nita Sells OFFICE MAKE APPOINTMENT TO SEE HIM  IN 1 MONTH.

## 2012-02-06 ENCOUNTER — Ambulatory Visit: Payer: Medicare Other | Admitting: Physical Therapy

## 2012-02-06 ENCOUNTER — Telehealth: Payer: Self-pay | Admitting: *Deleted

## 2012-02-06 NOTE — Telephone Encounter (Signed)
Unable to reach patient.

## 2012-02-10 ENCOUNTER — Encounter: Payer: Self-pay | Admitting: Gastroenterology

## 2012-02-10 ENCOUNTER — Telehealth: Payer: Self-pay | Admitting: Gastroenterology

## 2012-02-10 NOTE — Telephone Encounter (Signed)
Spoke with pt and she is aware.

## 2012-02-10 NOTE — Telephone Encounter (Signed)
I am really unsure whether this is related to omeprazole but lets discontinue this.

## 2012-02-10 NOTE — Telephone Encounter (Signed)
Hold for now 

## 2012-02-10 NOTE — Telephone Encounter (Signed)
Do you want to start her on something different or just hold for now?

## 2012-02-10 NOTE — Telephone Encounter (Signed)
Pt states she had an EGD 02/05/12. She states she was given omeprazole to take and it seemed to help for the first couple of days. Pt states that now she is having pain in her legs and back with some tingling in her legs. Thinks this is related to the omeprazole. Dr. Arlyce Dice please advise.

## 2012-02-11 ENCOUNTER — Ambulatory Visit: Payer: Medicare Other

## 2012-02-13 ENCOUNTER — Ambulatory Visit: Payer: Medicare Other | Admitting: Rehabilitation

## 2012-02-18 ENCOUNTER — Ambulatory Visit: Payer: Medicare Other | Attending: Neurosurgery | Admitting: Physical Therapy

## 2012-02-18 DIAGNOSIS — M25659 Stiffness of unspecified hip, not elsewhere classified: Secondary | ICD-10-CM | POA: Insufficient documentation

## 2012-02-18 DIAGNOSIS — IMO0001 Reserved for inherently not codable concepts without codable children: Secondary | ICD-10-CM | POA: Insufficient documentation

## 2012-02-18 DIAGNOSIS — M545 Low back pain, unspecified: Secondary | ICD-10-CM | POA: Insufficient documentation

## 2012-02-18 DIAGNOSIS — R5381 Other malaise: Secondary | ICD-10-CM | POA: Insufficient documentation

## 2012-02-20 ENCOUNTER — Encounter: Payer: Medicare Other | Admitting: Physical Therapy

## 2012-02-27 ENCOUNTER — Emergency Department (HOSPITAL_COMMUNITY): Payer: Medicare Other

## 2012-02-27 ENCOUNTER — Emergency Department (HOSPITAL_COMMUNITY)
Admission: EM | Admit: 2012-02-27 | Discharge: 2012-02-27 | Disposition: A | Discharge status: Home / Self Care | Payer: Medicare Other | Attending: Emergency Medicine | Admitting: Emergency Medicine

## 2012-02-27 ENCOUNTER — Encounter (HOSPITAL_COMMUNITY): Payer: Self-pay

## 2012-02-27 DIAGNOSIS — J4489 Other specified chronic obstructive pulmonary disease: Secondary | ICD-10-CM | POA: Insufficient documentation

## 2012-02-27 DIAGNOSIS — Z9861 Coronary angioplasty status: Secondary | ICD-10-CM | POA: Insufficient documentation

## 2012-02-27 DIAGNOSIS — K5900 Constipation, unspecified: Secondary | ICD-10-CM | POA: Insufficient documentation

## 2012-02-27 DIAGNOSIS — F17200 Nicotine dependence, unspecified, uncomplicated: Secondary | ICD-10-CM | POA: Insufficient documentation

## 2012-02-27 DIAGNOSIS — D72829 Elevated white blood cell count, unspecified: Secondary | ICD-10-CM | POA: Insufficient documentation

## 2012-02-27 DIAGNOSIS — R109 Unspecified abdominal pain: Secondary | ICD-10-CM | POA: Insufficient documentation

## 2012-02-27 DIAGNOSIS — I10 Essential (primary) hypertension: Secondary | ICD-10-CM | POA: Insufficient documentation

## 2012-02-27 DIAGNOSIS — R42 Dizziness and giddiness: Secondary | ICD-10-CM | POA: Insufficient documentation

## 2012-02-27 DIAGNOSIS — J449 Chronic obstructive pulmonary disease, unspecified: Secondary | ICD-10-CM | POA: Insufficient documentation

## 2012-02-27 DIAGNOSIS — E785 Hyperlipidemia, unspecified: Secondary | ICD-10-CM | POA: Insufficient documentation

## 2012-02-27 DIAGNOSIS — I2510 Atherosclerotic heart disease of native coronary artery without angina pectoris: Secondary | ICD-10-CM | POA: Insufficient documentation

## 2012-02-27 HISTORY — DX: Chronic obstructive pulmonary disease, unspecified: J44.9

## 2012-02-27 LAB — COMPREHENSIVE METABOLIC PANEL
ALT: 9 U/L (ref 0–35)
AST: 12 U/L (ref 0–37)
Alkaline Phosphatase: 52 U/L (ref 39–117)
CO2: 27 mEq/L (ref 19–32)
Chloride: 101 mEq/L (ref 96–112)
GFR calc Af Amer: 57 mL/min — ABNORMAL LOW (ref >90)
GFR calc non Af Amer: 49 mL/min — ABNORMAL LOW (ref >90)
Glucose, Bld: 118 mg/dL — ABNORMAL HIGH (ref 70–99)
Potassium: 3.8 mEq/L (ref 3.5–5.1)
Sodium: 138 mEq/L (ref 135–145)

## 2012-02-27 LAB — DIFFERENTIAL
Basophils Absolute: 0 10*3/uL (ref 0.0–0.1)
Lymphocytes Relative: 14 % (ref 12–46)
Monocytes Absolute: 0.8 10*3/uL (ref 0.1–1.0)
Monocytes Relative: 7 % (ref 3–12)
Neutro Abs: 8.9 10*3/uL — ABNORMAL HIGH (ref 1.7–7.7)
Neutrophils Relative %: 78 % — ABNORMAL HIGH (ref 43–77)

## 2012-02-27 LAB — CBC
HCT: 43.8 % (ref 36.0–46.0)
Hemoglobin: 14.8 g/dL (ref 12.0–15.0)
WBC: 11.5 10*3/uL — ABNORMAL HIGH (ref 4.0–10.5)

## 2012-02-27 MED ORDER — MECLIZINE HCL 25 MG PO TABS
25.0000 mg | ORAL_TABLET | Freq: Once | ORAL | Status: AC
Start: 2012-02-27 — End: 2012-02-27
  Administered 2012-02-27: 25 mg via ORAL
  Filled 2012-02-27: qty 1

## 2012-02-27 MED ORDER — MECLIZINE HCL 25 MG PO TABS: 25.0000 mg | ORAL_TABLET | Freq: Three times a day (TID) | ORAL | Status: AC | PRN

## 2012-02-27 MED ORDER — MAGNESIUM CITRATE PO SOLN: 296.0000 mL | Freq: Once | ORAL | Status: AC

## 2012-02-27 MED ORDER — ONDANSETRON HCL 4 MG/2ML IJ SOLN
INTRAMUSCULAR | Status: AC
  Administered 2012-02-27: 10:00:00
  Filled 2012-02-27: qty 2

## 2012-02-27 NOTE — ED Notes (Signed)
Per EMS , pt is from home c/o constipation with LBM 02/26/12 and still feels full. Also c/o dizziness since yesterday with a hx of vertigo. Pain all over d/t chronic pain. 20g to RFA, VSS 144/72, HR 50's, 16 RR, 98% on 2 L/[place].

## 2012-02-27 NOTE — ED Notes (Signed)
Pt d/c home in nad. Pt voiced understanding of d.c instructions. Pt daughter to pick pt up

## 2012-02-27 NOTE — Discharge Instructions (Signed)
Abdominal Pain Abdominal pain can be caused by many things. Your caregiver decides the seriousness of your pain by an examination and possibly blood tests and X-rays. Many cases can be observed and treated at home. Most abdominal pain is not caused by a disease and will probably improve without treatment. However, in many cases, more time must pass before a clear cause of the pain can be found. Before that point, it may not be known if you need more testing, or if hospitalization or surgery is needed. HOME CARE INSTRUCTIONS   Do not take laxatives unless directed by your caregiver.   Take pain medicine only as directed by your caregiver.   Only take over-the-counter or prescription medicines for pain, discomfort, or fever as directed by your caregiver.   Try a clear liquid diet (broth, tea, or water) for as long as directed by your caregiver. Slowly move to a bland diet as tolerated.  SEEK IMMEDIATE MEDICAL CARE IF:   The pain does not go away.   You have a fever.   You keep throwing up (vomiting).   The pain is felt only in portions of the abdomen. Pain in the right side could possibly be appendicitis. In an adult, pain in the left lower portion of the abdomen could be colitis or diverticulitis.   You pass bloody or black tarry stools.  MAKE SURE YOU:   Understand these instructions.   Will watch your condition.   Will get help right away if you are not doing well or get worse.  Document Released: 08/14/2005 Document Revised: 10/24/2011 Document Reviewed: 06/22/2008 [facility] Patient Information 2012 [place], [place].Vertigo Vertigo means you feel like you or your surroundings are moving when they are not. Vertigo can be dangerous if it occurs when you are at work, driving, or performing difficult activities.  CAUSES  Vertigo occurs when there is a conflict of signals sent to your brain from the visual and sensory systems in your body. There are many different causes of vertigo,  including:  Infections, especially in the inner ear.   A bad reaction to a drug or misuse of alcohol and medicines.   Withdrawal from drugs or alcohol.   Rapidly changing positions, such as lying down or rolling over in bed.   A migraine headache.   Decreased blood flow to the brain.   Increased pressure in the brain from a head injury, infection, tumor, or bleeding.  SYMPTOMS  You may feel as though the world is spinning around or you are falling to the ground. Because your balance is upset, vertigo can cause nausea and vomiting. You may have involuntary eye movements (nystagmus). DIAGNOSIS  Vertigo is usually diagnosed by physical exam. If the cause of your vertigo is unknown, your caregiver may perform imaging tests, such as an MRI scan (magnetic resonance imaging). TREATMENT  Most cases of vertigo resolve on their own, without treatment. Depending on the cause, your caregiver may prescribe certain medicines. If your vertigo is related to body position issues, your caregiver may recommend movements or procedures to correct the problem. In rare cases, if your vertigo is caused by certain inner ear problems, you may need surgery. HOME CARE INSTRUCTIONS   Follow your caregiver's instructions.   Avoid driving.   Avoid operating heavy machinery.   Avoid performing any tasks that would be dangerous to you or others during a vertigo episode.   Tell your caregiver if you notice that certain medicines seem to be causing your vertigo. Some of  the medicines used to treat vertigo episodes can actually make them worse in some people.  SEEK IMMEDIATE MEDICAL CARE IF:   Your medicines do not relieve your vertigo or are making it worse.   You develop problems with talking, walking, weakness, or using your arms, hands, or legs.   You develop severe headaches.   Your nausea or vomiting continues or gets worse.   You develop visual changes.   A family member notices behavioral changes.     Your condition gets worse.  MAKE SURE YOU:  Understand these instructions.   Will watch your condition.   Will get help right away if you are not doing well or get worse.  Document Released: 08/14/2005 Document Revised: 10/24/2011 Document Reviewed: 05/23/2011 [facility] Patient Information 2012 [place], [place].

## 2012-02-27 NOTE — ED Provider Notes (Signed)
History     CSN: [id number]  Arrival date & time 02/27/12  0944   First MD Initiated Contact with Patient 02/27/12 1015      Chief Complaint  Patient presents with  . Constipation  . Dizziness  . pain all over     (Consider location/radiation/quality/duration/timing/severity/associated sxs/prior treatment) HPI Comments: Also complains of dizziness, feels unsteady.  Constipation "makes my whole body hurt".  Patient is a 77 y.o. female presenting with constipation. The history is provided by the patient.  Constipation  The current episode started 3 to 5 days ago. The problem occurs occasionally. The problem has been gradually worsening. The pain is moderate. The stool is described as hard. There was no prior successful therapy. Pertinent negatives include no fever, no nausea, no rectal pain and no vomiting.    Past Medical History  Diagnosis Date  . Hyperlipemia   . Unspecified essential hypertension   . GERD (gastroesophageal reflux disease)   . CAD (coronary artery disease)   . COPD (chronic obstructive pulmonary disease)     Past Surgical History  Procedure Date  . Vaginal hysterectomy   . Coronary angioplasty with stent placement   . Lumbar spine surgery     Rods, screws and cages  . Abdominal hysterectomy   . Back surgery     Family History  Problem Relation Age of Onset  . Glaucoma Father   . Bone cancer Mother     History  Substance Use Topics  . Smoking status: Current Some Day Smoker -- 0.2 packs/day  . Smokeless tobacco: Never Used  . Alcohol Use: No    OB History    Grav Para Term Preterm Abortions TAB SAB Ect Mult Living                  Review of Systems  Constitutional: Negative for fever and chills.  Respiratory: Negative for shortness of breath.   Gastrointestinal: Positive for constipation. Negative for nausea, vomiting and rectal pain.  All other systems reviewed and are negative.    Allergies  Iohexol and Lipitor  Home  Medications   Current Outpatient Rx  Name Route Sig Dispense Refill  . VITAMIN D 1000 UNITS PO TABS Oral Take 1,000 Units by mouth daily.     Marland Kitchen HYDROCHLOROTHIAZIDE 25 MG PO TABS Oral Take 25 mg by mouth daily.     Marland Kitchen HYDROCODONE-ACETAMINOPHEN 10-325 MG PO TABS Oral Take 1 tablet by mouth every 4 (four) hours as needed. For pain    . HYDROCORTISONE ACETATE 25 MG RE SUPP Rectal Place 25 mg rectally 2 (two) times daily. For hemorrhoids    . PROMETHAZINE HCL 25 MG PO TABS Oral Take 25 mg by mouth every 4 (four) hours as needed. For nausea    . PROPRANOLOL HCL 40 MG PO TABS Oral Take 40 mg by mouth 2 (two) times daily.    Marland Kitchen RANITIDINE HCL 300 MG PO TABS Oral Take 300 mg by mouth daily.    Marland Kitchen HYDROCORTISONE ACETATE 25 MG RE SUPP Rectal Place 1 suppository (25 mg total) rectally every 12 (twelve) hours. 12 suppository 1  . NITROSTAT 0.4 MG SL SUBL  TAKE AS DIRECTED 25 each 11    BP 132/95  Pulse 51  Temp(Src) 97.8 F (36.6 C) (Oral)  Resp 18  Ht 5\' 5"  (1.651 m)  Wt 165 lb (74.844 kg)  BMI 27.46 kg/m2  SpO2 96%  Physical Exam  Nursing note and vitals reviewed. Constitutional: She is oriented to  person, place, and time. She appears well-developed and well-nourished. No distress.  HENT:  Head: Normocephalic and atraumatic.  Eyes: Pupils are equal, round, and reactive to light.  Neck: Normal range of motion. Neck supple.  Cardiovascular: Normal rate and regular rhythm.  Exam reveals no gallop and no friction rub.   No murmur heard. Pulmonary/Chest: Effort normal and breath sounds normal. No respiratory distress. She has no wheezes.  Abdominal: Soft. Bowel sounds are normal. She exhibits no distension. There is no tenderness.  Musculoskeletal: Normal range of motion.  Neurological: She is alert and oriented to person, place, and time. No cranial nerve deficit. She exhibits normal muscle tone. Coordination normal.  Skin: Skin is warm and dry. She is not diaphoretic.    ED Course    Procedures (including critical care time)   Labs Reviewed  CBC  DIFFERENTIAL  COMPREHENSIVE METABOLIC PANEL  LIPASE, BLOOD   No results found.   No diagnosis found.    MDM  The labs show a mild elevation of wbc, but are otherwise unremarkable.  In reviewing her chart, she has had two ct scans of the abd in the past and I am reluctant to perform another her symptoms sound more like constipation.  She has been taking hydrocodone for pain and I suspect these are constipating her.  She will be given mag citrate, give time, and return if doesn't improve or if she worsens.        Geoffery Lyons, MD 02/27/12 [phone]

## 2012-03-04 ENCOUNTER — Ambulatory Visit (INDEPENDENT_AMBULATORY_CARE_PROVIDER_SITE_OTHER): Payer: Medicare Other | Admitting: Gastroenterology

## 2012-03-04 ENCOUNTER — Encounter: Payer: Self-pay | Admitting: Gastroenterology

## 2012-03-04 VITALS — BP 116/56 | HR 64 | Ht 65.0 in | Wt 168.0 lb

## 2012-03-04 DIAGNOSIS — K6289 Other specified diseases of anus and rectum: Secondary | ICD-10-CM

## 2012-03-04 DIAGNOSIS — R1310 Dysphagia, unspecified: Secondary | ICD-10-CM

## 2012-03-04 NOTE — Assessment & Plan Note (Signed)
Improved following dilation of a distal esophageal stricture. 

## 2012-03-04 NOTE — Progress Notes (Signed)
History of Present Illness:  [name] has returned following endoscopy where an early distal esophageal stricture was dilated. Dysphagia is significant improved.  She continues to complain of severe rectal pain. The pain runs across her buttocks and into the rectum and sometimes down her leg. It is not affected by moving her bowels. She took Anusol suppositories with some improvement.  She complains of posterior neck pain as well.    Review of Systems: Pertinent positive and negative review of systems were noted in the above HPI section. All other review of systems were otherwise negative.    Current Medications, Allergies, Past Medical History, Past Surgical History, Family History and Social History were reviewed in [facility] electronic medical record  Vital signs were reviewed in today's medical record. Physical Exam: General: Well developed , well nourished, no acute distress

## 2012-03-04 NOTE — Patient Instructions (Signed)
Follow up as needed

## 2012-03-04 NOTE — Assessment & Plan Note (Addendum)
At this point I am suspicious that her pain is neuropathic pain. She has undergone extensive surgery on her lumbosacral spine in the past.  Recommendations #1 she is already scheduled to followup with her neurosurgeon. I will hold any further tests or treatment pending reevaluation by neurosurgery.

## 2012-03-28 ENCOUNTER — Other Ambulatory Visit: Payer: Self-pay | Admitting: Cardiovascular Disease

## 2012-04-28 ENCOUNTER — Encounter: Payer: Self-pay | Admitting: Cardiology

## 2012-04-28 ENCOUNTER — Encounter: Payer: Self-pay | Admitting: *Deleted

## 2012-04-28 ENCOUNTER — Ambulatory Visit (INDEPENDENT_AMBULATORY_CARE_PROVIDER_SITE_OTHER): Payer: Medicare Other | Admitting: Cardiology

## 2012-04-28 VITALS — BP 180/90 | HR 59 | Ht 65.0 in | Wt 170.1 lb

## 2012-04-28 DIAGNOSIS — I2510 Atherosclerotic heart disease of native coronary artery without angina pectoris: Secondary | ICD-10-CM

## 2012-04-28 DIAGNOSIS — E785 Hyperlipidemia, unspecified: Secondary | ICD-10-CM

## 2012-04-28 DIAGNOSIS — R079 Chest pain, unspecified: Secondary | ICD-10-CM

## 2012-04-28 DIAGNOSIS — Z01810 Encounter for preprocedural cardiovascular examination: Secondary | ICD-10-CM

## 2012-04-28 LAB — CBC WITH DIFFERENTIAL/PLATELET
Basophils Absolute: 0 10*3/uL (ref 0.0–0.1)
Eosinophils Relative: 0.6 % (ref 0.0–5.0)
Lymphs Abs: 2.3 10*3/uL (ref 0.7–4.0)
MCV: 96.1 fl (ref 78.0–100.0)
Monocytes Absolute: 0.6 10*3/uL (ref 0.1–1.0)
Monocytes Relative: 5.5 % (ref 3.0–12.0)
Neutrophils Relative %: 72 % (ref 43.0–77.0)
Platelets: 215 10*3/uL (ref 150.0–400.0)
RDW: 12.5 % (ref 11.5–14.6)
WBC: 10.7 10*3/uL — ABNORMAL HIGH (ref 4.5–10.5)

## 2012-04-28 LAB — BASIC METABOLIC PANEL
BUN: 14 mg/dL (ref 6–23)
Creatinine, Ser: 1.1 mg/dL (ref 0.4–1.2)
GFR: 50.89 mL/min — ABNORMAL LOW (ref >60.00)
Glucose, Bld: 102 mg/dL — ABNORMAL HIGH (ref 70–99)

## 2012-04-28 LAB — PROTIME-INR
INR: 1 ratio (ref 0.8–1.0)
Prothrombin Time: 11 s (ref 10.2–12.4)

## 2012-04-28 MED ORDER — ISOSORBIDE MONONITRATE ER 30 MG PO TB24: 30.0000 mg | ORAL_TABLET | Freq: Every day | ORAL | Status: DC

## 2012-04-28 NOTE — Patient Instructions (Signed)
Please start Isosorbide 30 mg a day for help with chest pain. Continue all other medications as listed  Please have blood work today  Your physician has requested that you have a cardiac catheterization. Cardiac catheterization is used to diagnose and/or treat various heart conditions. Doctors may recommend this procedure for a number of different reasons. The most common reason is to evaluate chest pain. Chest pain can be a symptom of coronary artery disease (CAD), and cardiac catheterization can show whether plaque is narrowing or blocking your heart's arteries. This procedure is also used to evaluate the valves, as well as measure the blood flow and oxygen levels in different parts of your heart. For further information please visit [email or web address] Please follow instruction sheet, as given.  Follow up will be scheduled after your heart cath.

## 2012-04-28 NOTE — Assessment & Plan Note (Signed)
The patient reports her lipids are followed by her primary provider. The goal should be an LDL less than 100 and HDL greater than 40.

## 2012-04-28 NOTE — Progress Notes (Signed)
 HPI The patient presents as a new patient for me. The patient presents as a new patient for me having previously been treated by Dr.Tennant.  She has a history of coronary artery disease with multiple stents. Her most recent coronary anatomy as described below. She called to schedule this appointment because she was having arm pain. She says that this has been happening for about a month. It occurs sporadically. She cannot bring it on with activity.  She takes nitroglycerin and it goes away. She describes a squeezing sensation in her chest as well. She might become nauseated. She has had some shortness of breath. He becomes quite intense. She denies any palpitations, presyncope or syncope. She says she's taken about 9 or 10 nitroglycerin in the last month. It seems to be a progressive pattern.   Allergies  Allergen Reactions  . Iohexol      Code: HIVES, Desc: PER ROBIN @ [facility], PT IS ALLERGIC TO IVP DYE 08/28/10/RM, Onset Date: 10112011   . Lipitor (Atorvastatin Calcium) Hives    Hives per pt. She states she thinks this is the right medicine    Current Outpatient Prescriptions  Medication Sig Dispense Refill  . cholecalciferol (VITAMIN D) 1000 UNITS tablet Take 1,000 Units by mouth daily.       . hydrochlorothiazide (HYDRODIURIL) 25 MG tablet Take 25 mg by mouth daily.       . HYDROcodone-acetaminophen (NORCO) 10-325 MG per tablet Take 1 tablet by mouth every 4 (four) hours as needed. For pain      . hydrocortisone (ANUSOL-HC) 25 MG suppository Place 1 suppository (25 mg total) rectally every 12 (twelve) hours.  12 suppository  1  . NITROSTAT 0.4 MG SL tablet TAKE AS DIRECTED  25 each  11  . promethazine (PHENERGAN) 25 MG tablet Take 25 mg by mouth every 4 (four) hours as needed. For nausea      . propranolol (INDERAL) 40 MG tablet Take 40 mg by mouth 2 (two) times daily.      . ranitidine (ZANTAC) 300 MG tablet Take 300 mg by mouth daily.      . DISCONTD: calcium carbonate (OS-CAL) 600 MG  TABS Take 600 mg by mouth daily.        . DISCONTD: pravastatin (PRAVACHOL) 40 MG tablet Take 40 mg by mouth daily.         Current Facility-Administered Medications  Medication Dose Route Frequency Provider Last Rate Last Dose  . 0.9 %  sodium chloride infusion  500 mL Intravenous Continuous Robert D Kaplan, MD        Past Medical History  Diagnosis Date  . Hyperlipemia   . Unspecified essential hypertension   . GERD (gastroesophageal reflux disease)   . CAD (coronary artery disease)   . COPD (chronic obstructive pulmonary disease)     Past Surgical History  Procedure Date  . Vaginal hysterectomy   . Coronary angioplasty with stent placement   . Lumbar spine surgery     Rods, screws and cages  . Abdominal hysterectomy   . Back surgery     Family History  Problem Relation Age of Onset  . Glaucoma Father   . Bone cancer Mother     History   Social History  . Marital Status: Widowed    Spouse Name: N/A    Number of Children: 4  . Years of Education: N/A   Occupational History  . retired    Social History Main Topics  . Smoking status:   Former Smoker -- 0.2 packs/day    Quit date: 02/29/2012  . Smokeless tobacco: Never Used  . Alcohol Use: No  . Drug Use: No  . Sexually Active: Not on file     1 cig to 1/4 of a pack a day   Other Topics Concern  . Not on file   Social History Narrative  . No narrative on file    ROS:  As stated in the HPI and negative for all other systems.   PHYSICAL EXAM BP 180/90  Pulse 59  Ht 5' 5" (1.651 m)  Wt 170 lb 1.9 oz (77.166 kg)  BMI 28.31 kg/m2 GENERAL:  Well appearing HEENT:  Pupils equal round and reactive, fundi not visualized, oral mucosa unremarkable, dentures NECK:  No jugular venous distention, waveform within normal limits, carotid upstroke brisk and symmetric, no bruits, no thyromegaly LYMPHATICS:  No cervical, inguinal adenopathy LUNGS:  Clear to auscultation bilaterally BACK:  No CVA tenderness CHEST:   Unremarkable HEART:  PMI not displaced or sustained,S1 and S2 within normal limits, no S3, no S4, no clicks, no rubs, no murmurs ABD:  Flat, positive bowel sounds normal in frequency in pitch, no bruits, no rebound, no guarding, no midline pulsatile mass, no hepatomegaly, no splenomegaly EXT:  2 plus pulses throughout, no edema, no cyanosis no clubbing SKIN:  No rashes no nodules NEURO:  Cranial nerves II through XII grossly intact, motor grossly intact throughout PSYCH:  Cognitively intact, oriented to person place and time   EKG:  Sinus rhythm, rate 59, incomplete right bundle branch block, no acute ST-T wave changes. 04/28/2012   ASSESSMENT AND PLAN    

## 2012-04-28 NOTE — Assessment & Plan Note (Signed)
The patients symptoms are consistent with unstable angina. She needs a cardiac catheterization. The patient understands that risks included but are not limited to stroke (1 in 1000), death (1 in 1000), kidney failure [usually temporary] (1 in 500), bleeding (1 in 200), allergic reaction [possibly serious] (1 in 200).  The patient understands and agrees to proceed.  She will need endocarditis prophylaxis.

## 2012-04-29 ENCOUNTER — Telehealth: Payer: Self-pay | Admitting: *Deleted

## 2012-04-29 NOTE — Telephone Encounter (Signed)
Called pt and made her aware to take the following--thur. noc--3tabs prednisone,1 tab 20mg  pepcid and 1tab 25mg  benadryl--repeat these same medications Friday morning with your regular meds--I also called daughter and left detailed message about taking these meds

## 2012-05-01 ENCOUNTER — Inpatient Hospital Stay (HOSPITAL_BASED_OUTPATIENT_CLINIC_OR_DEPARTMENT_OTHER)
Admission: RE | Admit: 2012-05-01 | Discharge: 2012-05-01 | Disposition: A | Discharge status: Home / Self Care | Payer: Medicare Other | Source: Ambulatory Visit | Attending: Cardiovascular Disease | Admitting: Cardiovascular Disease

## 2012-05-01 ENCOUNTER — Encounter (HOSPITAL_BASED_OUTPATIENT_CLINIC_OR_DEPARTMENT_OTHER)
Admission: RE | Disposition: A | Discharge status: Home / Self Care | Payer: Self-pay | Source: Ambulatory Visit | Attending: Cardiovascular Disease

## 2012-05-01 DIAGNOSIS — R079 Chest pain, unspecified: Secondary | ICD-10-CM

## 2012-05-01 DIAGNOSIS — Z9861 Coronary angioplasty status: Secondary | ICD-10-CM | POA: Insufficient documentation

## 2012-05-01 DIAGNOSIS — I2510 Atherosclerotic heart disease of native coronary artery without angina pectoris: Secondary | ICD-10-CM

## 2012-05-01 DIAGNOSIS — I10 Essential (primary) hypertension: Secondary | ICD-10-CM | POA: Insufficient documentation

## 2012-05-01 DIAGNOSIS — J4489 Other specified chronic obstructive pulmonary disease: Secondary | ICD-10-CM | POA: Insufficient documentation

## 2012-05-01 DIAGNOSIS — E785 Hyperlipidemia, unspecified: Secondary | ICD-10-CM | POA: Insufficient documentation

## 2012-05-01 DIAGNOSIS — J449 Chronic obstructive pulmonary disease, unspecified: Secondary | ICD-10-CM | POA: Insufficient documentation

## 2012-05-01 DIAGNOSIS — K219 Gastro-esophageal reflux disease without esophagitis: Secondary | ICD-10-CM | POA: Insufficient documentation

## 2012-05-01 SURGERY — JV LEFT HEART CATHETERIZATION WITH CORONARY ANGIOGRAM
Anesthesia: Moderate Sedation

## 2012-05-01 MED ORDER — SODIUM CHLORIDE 0.9 % IJ SOLN
3.0000 mL | Freq: Two times a day (BID) | INTRAMUSCULAR | Status: DC
Start: 2012-05-01 — End: 2012-05-01

## 2012-05-01 MED ORDER — ASPIRIN 81 MG PO CHEW
324.0000 mg | CHEWABLE_TABLET | ORAL | Status: AC
  Administered 2012-05-01: 324 mg via ORAL

## 2012-05-01 MED ORDER — PREDNISONE 50 MG PO TABS: 60.0000 mg | ORAL_TABLET | ORAL | Status: DC

## 2012-05-01 MED ORDER — FAMOTIDINE IN NACL 20-0.9 MG/50ML-% IV SOLN
20.0000 mg | Freq: Two times a day (BID) | INTRAVENOUS | Status: DC
  Administered 2012-05-01: 20 mg via INTRAVENOUS

## 2012-05-01 MED ORDER — DIAZEPAM 5 MG PO TABS
5.0000 mg | ORAL_TABLET | Freq: Once | ORAL | Status: AC
  Administered 2012-05-01: 5 mg via ORAL

## 2012-05-01 MED ORDER — SODIUM CHLORIDE 0.9 % IV SOLN: INTRAVENOUS | Status: DC

## 2012-05-01 MED ORDER — SODIUM CHLORIDE 0.9 % IV SOLN: 250.0000 mL | INTRAVENOUS | Status: DC | PRN

## 2012-05-01 MED ORDER — ACETAMINOPHEN 325 MG PO TABS: 650.0000 mg | ORAL_TABLET | ORAL | Status: DC | PRN

## 2012-05-01 MED ORDER — SODIUM CHLORIDE 0.9 % IJ SOLN: 3.0000 mL | INTRAMUSCULAR | Status: DC | PRN

## 2012-05-01 MED ORDER — CLOPIDOGREL BISULFATE 300 MG PO TABS
600.0000 mg | ORAL_TABLET | Freq: Once | ORAL | Status: AC
  Administered 2012-05-01: 600 mg via ORAL

## 2012-05-01 MED ORDER — FAMOTIDINE 20 MG PO TABS: 20.0000 mg | ORAL_TABLET | ORAL | Status: DC

## 2012-05-01 MED ORDER — SODIUM CHLORIDE 0.9 % IV SOLN: INTRAVENOUS | Status: AC

## 2012-05-01 NOTE — OR Nursing (Signed)
Tegaderm dressing applied, site level 0, bedrest begins at 1210 

## 2012-05-01 NOTE — Interval H&P Note (Signed)
History and Physical Interval Note:  05/01/2012 11:28 AM  [name]  has presented today for surgery, with the diagnosis of chest pain  The various methods of treatment have been discussed with the patient and family. After consideration of risks, benefits and other options for treatment, the patient has consented to  Procedure(s) (LRB): JV LEFT HEART CATHETERIZATION WITH CORONARY ANGIOGRAM (N/A) as a surgical intervention .  The patients' history has been reviewed, patient examined, no change in status, stable for surgery.  I have reviewed the patients' chart and labs.  Questions were answered to the patient's satisfaction.     [name]

## 2012-05-01 NOTE — H&P (View-Only) (Signed)
 HPI The patient presents as a new patient for me. The patient presents as a new patient for me having previously been treated by Dr.Tennant.  She has a history of coronary artery disease with multiple stents. Her most recent coronary anatomy as described below. She called to schedule this appointment because she was having arm pain. She says that this has been happening for about a month. It occurs sporadically. She cannot bring it on with activity.  She takes nitroglycerin and it goes away. She describes a squeezing sensation in her chest as well. She might become nauseated. She has had some shortness of breath. He becomes quite intense. She denies any palpitations, presyncope or syncope. She says she's taken about 9 or 10 nitroglycerin in the last month. It seems to be a progressive pattern.   Allergies  Allergen Reactions  . Iohexol      Code: HIVES, Desc: PER ROBIN @ [facility], PT IS ALLERGIC TO IVP DYE 08/28/10/RM, Onset Date: 10112011   . Lipitor (Atorvastatin Calcium) Hives    Hives per pt. She states she thinks this is the right medicine    Current Outpatient Prescriptions  Medication Sig Dispense Refill  . cholecalciferol (VITAMIN D) 1000 UNITS tablet Take 1,000 Units by mouth daily.       . hydrochlorothiazide (HYDRODIURIL) 25 MG tablet Take 25 mg by mouth daily.       . HYDROcodone-acetaminophen (NORCO) 10-325 MG per tablet Take 1 tablet by mouth every 4 (four) hours as needed. For pain      . hydrocortisone (ANUSOL-HC) 25 MG suppository Place 1 suppository (25 mg total) rectally every 12 (twelve) hours.  12 suppository  1  . NITROSTAT 0.4 MG SL tablet TAKE AS DIRECTED  25 each  11  . promethazine (PHENERGAN) 25 MG tablet Take 25 mg by mouth every 4 (four) hours as needed. For nausea      . propranolol (INDERAL) 40 MG tablet Take 40 mg by mouth 2 (two) times daily.      . ranitidine (ZANTAC) 300 MG tablet Take 300 mg by mouth daily.      . DISCONTD: calcium carbonate (OS-CAL) 600 MG  TABS Take 600 mg by mouth daily.        . DISCONTD: pravastatin (PRAVACHOL) 40 MG tablet Take 40 mg by mouth daily.         Current Facility-Administered Medications  Medication Dose Route Frequency Provider Last Rate Last Dose  . 0.9 %  sodium chloride infusion  500 mL Intravenous Continuous Robert D Kaplan, MD        Past Medical History  Diagnosis Date  . Hyperlipemia   . Unspecified essential hypertension   . GERD (gastroesophageal reflux disease)   . CAD (coronary artery disease)   . COPD (chronic obstructive pulmonary disease)     Past Surgical History  Procedure Date  . Vaginal hysterectomy   . Coronary angioplasty with stent placement   . Lumbar spine surgery     Rods, screws and cages  . Abdominal hysterectomy   . Back surgery     Family History  Problem Relation Age of Onset  . Glaucoma Father   . Bone cancer Mother     History   Social History  . Marital Status: Widowed    Spouse Name: N/A    Number of Children: 4  . Years of Education: N/A   Occupational History  . retired    Social History Main Topics  . Smoking status:   Former Smoker -- 0.2 packs/day    Quit date: 02/29/2012  . Smokeless tobacco: Never Used  . Alcohol Use: No  . Drug Use: No  . Sexually Active: Not on file     1 cig to 1/4 of a pack a day   Other Topics Concern  . Not on file   Social History Narrative  . No narrative on file    ROS:  As stated in the HPI and negative for all other systems.   PHYSICAL EXAM BP 180/90  Pulse 59  Ht 5' 5" (1.651 m)  Wt 170 lb 1.9 oz (77.166 kg)  BMI 28.31 kg/m2 GENERAL:  Well appearing HEENT:  Pupils equal round and reactive, fundi not visualized, oral mucosa unremarkable, dentures NECK:  No jugular venous distention, waveform within normal limits, carotid upstroke brisk and symmetric, no bruits, no thyromegaly LYMPHATICS:  No cervical, inguinal adenopathy LUNGS:  Clear to auscultation bilaterally BACK:  No CVA tenderness CHEST:   Unremarkable HEART:  PMI not displaced or sustained,S1 and S2 within normal limits, no S3, no S4, no clicks, no rubs, no murmurs ABD:  Flat, positive bowel sounds normal in frequency in pitch, no bruits, no rebound, no guarding, no midline pulsatile mass, no hepatomegaly, no splenomegaly EXT:  2 plus pulses throughout, no edema, no cyanosis no clubbing SKIN:  No rashes no nodules NEURO:  Cranial nerves II through XII grossly intact, motor grossly intact throughout PSYCH:  Cognitively intact, oriented to person place and time   EKG:  Sinus rhythm, rate 59, incomplete right bundle branch block, no acute ST-T wave changes. 04/28/2012   ASSESSMENT AND PLAN    

## 2012-05-01 NOTE — CV Procedure (Signed)
   Cardiac Catheterization Operative Report  [name] [id number] 6/14/201312:03 PM [name], MD  Procedure Performed:  1. Left Heart Catheterization 2. Selective Coronary Angiography 3. Left ventricular angiogram  Operator: Verne Carrow, MD  Indication: Chest pain, arm pain, known CAD with multiple prior stents.                                       Procedure Details: The risks, benefits, complications, treatment options, and expected outcomes were discussed with the patient. The patient and/or family concurred with the proposed plan, giving informed consent. The patient was brought to the outpatient cath lab after IV hydration was begun and oral premedication was given. The patient was further sedated with Versed and Fentanyl. The right groin was prepped and draped in the usual manner. Using the modified Seldinger access technique, a 5 French sheath was placed in the right femoral artery. Standard diagnostic catheters were used to perform selective coronary angiography. A pigtail catheter was used to perform a left ventricular angiogram.  There were no immediate complications. The patient was taken to the recovery area in stable condition.   Hemodynamic Findings: Central aortic pressure: 145/66 Left ventricular pressure: 145/13/32  Angiographic Findings:  Left main: No obstructive disease noted.   Left Anterior Descending Artery: Large caliber vessel that does not reach the apex. There is a patent proximal stent with minimal, 20% restenosis. There is a patent stent in the mid to distal vessel at the bifurcation with the modeate sized diagonal branch. There is bifurcational stenting with a stent present in the proximal segment of the diagonal extending back into the LAD. There is no restenosis in this stented segment. No flow limiting lesions noted.   Circumflex Artery: Large caliber vessel wih several small marginal branches and termination of the AV groove  Circumflex into a moderate sized OM branch. The proximal Circumflex has a 90% stenosis.   Right Coronary Artery: Large, dominant vessel with patent stent mid vessel. 20% restenosis diffusely in the stent. No flow limiting lesions noted.   Left Ventricular Angiogram: LVEF 60-65%.   Impression: 1. Triple vessel CAD with patent stents in the LAD, diagonal, RCA. 2. Severe stenosis in the proximal Circumflex.  3. Preserved LV systolic function.   Recommendations: I will start Plavix today and will stage PCI of the Circumflex artery next week.        Complications:  None. The patient tolerated the procedure well.

## 2012-05-06 ENCOUNTER — Ambulatory Visit (HOSPITAL_COMMUNITY)
Admission: RE | Admit: 2012-05-06 | Discharge: 2012-05-07 | Disposition: A | Discharge status: Home / Self Care | Payer: Medicare Other | Source: Ambulatory Visit | Attending: Cardiovascular Disease | Admitting: Cardiovascular Disease

## 2012-05-06 ENCOUNTER — Encounter (HOSPITAL_COMMUNITY): Payer: Self-pay | Admitting: General Practice

## 2012-05-06 ENCOUNTER — Encounter (HOSPITAL_COMMUNITY)
Admission: RE | Disposition: A | Discharge status: Home / Self Care | Payer: Self-pay | Source: Ambulatory Visit | Attending: Cardiovascular Disease

## 2012-05-06 DIAGNOSIS — H9190 Unspecified hearing loss, unspecified ear: Secondary | ICD-10-CM | POA: Insufficient documentation

## 2012-05-06 DIAGNOSIS — E785 Hyperlipidemia, unspecified: Secondary | ICD-10-CM | POA: Insufficient documentation

## 2012-05-06 DIAGNOSIS — J4489 Other specified chronic obstructive pulmonary disease: Secondary | ICD-10-CM | POA: Insufficient documentation

## 2012-05-06 DIAGNOSIS — K219 Gastro-esophageal reflux disease without esophagitis: Secondary | ICD-10-CM | POA: Insufficient documentation

## 2012-05-06 DIAGNOSIS — J449 Chronic obstructive pulmonary disease, unspecified: Secondary | ICD-10-CM | POA: Insufficient documentation

## 2012-05-06 DIAGNOSIS — I200 Unstable angina: Secondary | ICD-10-CM | POA: Diagnosis not present

## 2012-05-06 DIAGNOSIS — Z955 Presence of coronary angioplasty implant and graft: Secondary | ICD-10-CM

## 2012-05-06 DIAGNOSIS — I2510 Atherosclerotic heart disease of native coronary artery without angina pectoris: Secondary | ICD-10-CM | POA: Insufficient documentation

## 2012-05-06 DIAGNOSIS — R1310 Dysphagia, unspecified: Secondary | ICD-10-CM | POA: Insufficient documentation

## 2012-05-06 DIAGNOSIS — M129 Arthropathy, unspecified: Secondary | ICD-10-CM | POA: Insufficient documentation

## 2012-05-06 DIAGNOSIS — I10 Essential (primary) hypertension: Secondary | ICD-10-CM | POA: Insufficient documentation

## 2012-05-06 HISTORY — DX: Unspecified osteoarthritis, unspecified site: M19.90

## 2012-05-06 HISTORY — PX: PERCUTANEOUS CORONARY STENT INTERVENTION (PCI-S): SHX5485

## 2012-05-06 HISTORY — PX: CORONARY ANGIOPLASTY WITH STENT PLACEMENT: SHX49

## 2012-05-06 HISTORY — DX: Unspecified hearing loss, unspecified ear: H91.90

## 2012-05-06 LAB — POCT ACTIVATED CLOTTING TIME: Activated Clotting Time: 169 seconds

## 2012-05-06 LAB — BASIC METABOLIC PANEL
BUN: 21 mg/dL (ref 6–23)
CO2: 24 mEq/L (ref 19–32)
GFR calc non Af Amer: 48 mL/min — ABNORMAL LOW (ref >90)
Glucose, Bld: 156 mg/dL — ABNORMAL HIGH (ref 70–99)
Potassium: 3.3 mEq/L — ABNORMAL LOW (ref 3.5–5.1)
Sodium: 136 mEq/L (ref 135–145)

## 2012-05-06 LAB — CBC
HCT: 43 % (ref 36.0–46.0)
Hemoglobin: 15.1 g/dL — ABNORMAL HIGH (ref 12.0–15.0)
MCHC: 35.1 g/dL (ref 30.0–36.0)
RBC: 4.69 MIL/uL (ref 3.87–5.11)

## 2012-05-06 SURGERY — PERCUTANEOUS CORONARY STENT INTERVENTION (PCI-S)
Anesthesia: LOCAL

## 2012-05-06 MED ORDER — FAMOTIDINE IN NACL 20-0.9 MG/50ML-% IV SOLN
INTRAVENOUS | Status: AC
  Filled 2012-05-06: qty 50

## 2012-05-06 MED ORDER — HYDROCODONE-ACETAMINOPHEN 10-325 MG PO TABS
1.0000 | ORAL_TABLET | ORAL | Status: DC | PRN
  Administered 2012-05-07: 1 via ORAL
  Filled 2012-05-06: qty 1

## 2012-05-06 MED ORDER — CLOPIDOGREL BISULFATE 75 MG PO TABS
75.0000 mg | ORAL_TABLET | Freq: Every day | ORAL | Status: DC
  Administered 2012-05-07: 75 mg via ORAL
  Filled 2012-05-06: qty 1

## 2012-05-06 MED ORDER — HEPARIN (PORCINE) IN NACL 2-0.9 UNIT/ML-% IJ SOLN
INTRAMUSCULAR | Status: AC
  Filled 2012-05-06: qty 2000

## 2012-05-06 MED ORDER — BIVALIRUDIN 250 MG IV SOLR
INTRAVENOUS | Status: AC
  Filled 2012-05-06: qty 250

## 2012-05-06 MED ORDER — ASPIRIN 81 MG PO CHEW
324.0000 mg | CHEWABLE_TABLET | ORAL | Status: AC
  Administered 2012-05-06: 324 mg via ORAL
  Filled 2012-05-06: qty 4

## 2012-05-06 MED ORDER — NITROGLYCERIN 0.2 MG/ML ON CALL CATH LAB
INTRAVENOUS | Status: AC
  Filled 2012-05-06: qty 1

## 2012-05-06 MED ORDER — PROPRANOLOL HCL 40 MG PO TABS
40.0000 mg | ORAL_TABLET | Freq: Two times a day (BID) | ORAL | Status: DC
  Administered 2012-05-06 – 2012-05-07 (×2): 40 mg via ORAL
  Filled 2012-05-06 (×3): qty 1

## 2012-05-06 MED ORDER — LIDOCAINE HCL (PF) 1 % IJ SOLN
INTRAMUSCULAR | Status: AC
  Filled 2012-05-06: qty 30

## 2012-05-06 MED ORDER — POTASSIUM CHLORIDE CRYS ER 20 MEQ PO TBCR
40.0000 meq | EXTENDED_RELEASE_TABLET | Freq: Once | ORAL | Status: AC
  Administered 2012-05-06: 40 meq via ORAL
  Filled 2012-05-06 (×2): qty 2

## 2012-05-06 MED ORDER — ISOSORBIDE MONONITRATE ER 30 MG PO TB24
30.0000 mg | ORAL_TABLET | Freq: Every day | ORAL | Status: DC
  Administered 2012-05-07: 30 mg via ORAL
  Filled 2012-05-06: qty 1

## 2012-05-06 MED ORDER — SODIUM CHLORIDE 0.9 % IV SOLN
INTRAVENOUS | Status: DC
  Administered 2012-05-06: 09:00:00 via INTRAVENOUS

## 2012-05-06 MED ORDER — FAMOTIDINE 20 MG PO TABS
20.0000 mg | ORAL_TABLET | Freq: Every day | ORAL | Status: DC
  Administered 2012-05-06 – 2012-05-07 (×2): 20 mg via ORAL
  Filled 2012-05-06 (×2): qty 1

## 2012-05-06 MED ORDER — SODIUM CHLORIDE 0.9 % IV SOLN: 250.0000 mL | INTRAVENOUS | Status: DC | PRN

## 2012-05-06 MED ORDER — SODIUM CHLORIDE 0.9 % IJ SOLN
3.0000 mL | Freq: Two times a day (BID) | INTRAMUSCULAR | Status: DC
  Administered 2012-05-06: 3 mL via INTRAVENOUS

## 2012-05-06 MED ORDER — HYDROCHLOROTHIAZIDE 25 MG PO TABS
25.0000 mg | ORAL_TABLET | Freq: Every day | ORAL | Status: DC
  Administered 2012-05-07: 25 mg via ORAL
  Filled 2012-05-06: qty 1

## 2012-05-06 MED ORDER — HYDRALAZINE HCL 20 MG/ML IJ SOLN
10.0000 mg | Freq: Once | INTRAMUSCULAR | Status: DC
  Filled 2012-05-06: qty 0.5

## 2012-05-06 MED ORDER — ACETAMINOPHEN 325 MG PO TABS: 650.0000 mg | ORAL_TABLET | ORAL | Status: DC | PRN

## 2012-05-06 MED ORDER — DIAZEPAM 5 MG PO TABS
5.0000 mg | ORAL_TABLET | ORAL | Status: AC
  Administered 2012-05-06: 5 mg via ORAL
  Filled 2012-05-06: qty 1

## 2012-05-06 MED ORDER — MIDAZOLAM HCL 2 MG/2ML IJ SOLN
INTRAMUSCULAR | Status: AC
  Filled 2012-05-06: qty 2

## 2012-05-06 MED ORDER — ONDANSETRON HCL 4 MG/2ML IJ SOLN
4.0000 mg | Freq: Four times a day (QID) | INTRAMUSCULAR | Status: DC | PRN
  Administered 2012-05-06: 4 mg via INTRAVENOUS
  Filled 2012-05-06: qty 2

## 2012-05-06 MED ORDER — SODIUM CHLORIDE 0.9 % IJ SOLN: 3.0000 mL | INTRAMUSCULAR | Status: DC | PRN

## 2012-05-06 MED ORDER — SODIUM CHLORIDE 0.9 % IV SOLN
INTRAVENOUS | Status: AC
  Administered 2012-05-06: 13:00:00 via INTRAVENOUS

## 2012-05-06 MED ORDER — NITROGLYCERIN 0.4 MG SL SUBL: 0.4000 mg | SUBLINGUAL_TABLET | SUBLINGUAL | Status: DC | PRN

## 2012-05-06 MED ORDER — CLOPIDOGREL BISULFATE 75 MG PO TABS
75.0000 mg | ORAL_TABLET | ORAL | Status: AC
  Administered 2012-05-06: 75 mg via ORAL
  Filled 2012-05-06: qty 1

## 2012-05-06 NOTE — Progress Notes (Signed)
Site area: right groin  Site Prior to Removal:  Level 0  Pressure Applied For 20 MINUTES    Minutes Beginning at 1425  Manual:   yes  Patient Status During Pull:  stable  Post Pull Groin Site:  Level 0  Post Pull Instructions Given:  yes  Post Pull Pulses Present:  yes  Dressing Applied:  yes  Comments:

## 2012-05-06 NOTE — Interval H&P Note (Signed)
History and Physical Interval Note:  05/06/2012 11:11 AM  [name]  has presented today for surgery, with the diagnosis of blockages  The various methods of treatment have been discussed with the patient and family. After consideration of risks, benefits and other options for treatment, the patient has consented to  Procedure(s) (LRB): PERCUTANEOUS CORONARY STENT INTERVENTION (PCI-S) (N/A) as a surgical intervention .  The patient's history has been reviewed, patient examined, no change in status, stable for surgery.  I have reviewed the patients' chart and labs.  Questions were answered to the patient's satisfaction.     [name]

## 2012-05-06 NOTE — H&P (View-Only) (Signed)
HPI The patient presents as a new patient for me. The patient presents as a new patient for me having previously been treated by Dr.Tennant.  She has a history of coronary artery disease with multiple stents. Her most recent coronary anatomy as described below. She called to schedule this appointment because she was having arm pain. She says that this has been happening for about a month. It occurs sporadically. She cannot bring it on with activity.  She takes nitroglycerin and it goes away. She describes a squeezing sensation in her chest as well. She might become nauseated. She has had some shortness of breath. He becomes quite intense. She denies any palpitations, presyncope or syncope. She says she's taken about 9 or 10 nitroglycerin in the last month. It seems to be a progressive pattern.   Allergies  Allergen Reactions  . Iohexol      Code: HIVES, Desc: PER ROBIN @ [facility], PT IS ALLERGIC TO IVP DYE 08/28/10/RM, Onset Date: [id number]   . Lipitor (Atorvastatin Calcium) Hives    Hives per pt. She states she thinks this is the right medicine    Current Outpatient Prescriptions  Medication Sig Dispense Refill  . cholecalciferol (VITAMIN D) 1000 UNITS tablet Take 1,000 Units by mouth daily.       . hydrochlorothiazide (HYDRODIURIL) 25 MG tablet Take 25 mg by mouth daily.       Marland Kitchen HYDROcodone-acetaminophen (NORCO) 10-325 MG per tablet Take 1 tablet by mouth every 4 (four) hours as needed. For pain      . hydrocortisone (ANUSOL-HC) 25 MG suppository Place 1 suppository (25 mg total) rectally every 12 (twelve) hours.  12 suppository  1  . NITROSTAT 0.4 MG SL tablet TAKE AS DIRECTED  25 each  11  . promethazine (PHENERGAN) 25 MG tablet Take 25 mg by mouth every 4 (four) hours as needed. For nausea      . propranolol (INDERAL) 40 MG tablet Take 40 mg by mouth 2 (two) times daily.      . ranitidine (ZANTAC) 300 MG tablet Take 300 mg by mouth daily.      Marland Kitchen DISCONTD: calcium carbonate (OS-CAL) 600 MG  TABS Take 600 mg by mouth daily.        Marland Kitchen DISCONTD: pravastatin (PRAVACHOL) 40 MG tablet Take 40 mg by mouth daily.         Current Facility-Administered Medications  Medication Dose Route Frequency Provider Last Rate Last Dose  . 0.9 %  sodium chloride infusion  500 mL Intravenous Continuous Louis Meckel, MD        Past Medical History  Diagnosis Date  . Hyperlipemia   . Unspecified essential hypertension   . GERD (gastroesophageal reflux disease)   . CAD (coronary artery disease)   . COPD (chronic obstructive pulmonary disease)     Past Surgical History  Procedure Date  . Vaginal hysterectomy   . Coronary angioplasty with stent placement   . Lumbar spine surgery     Rods, screws and cages  . Abdominal hysterectomy   . Back surgery     Family History  Problem Relation Age of Onset  . Glaucoma Father   . Bone cancer Mother     History   Social History  . Marital Status: Widowed    Spouse Name: N/A    Number of Children: 4  . Years of Education: N/A   Occupational History  . retired    Social History Main Topics  . Smoking status:  Former Smoker -- 0.2 packs/day    Quit date: 02/29/2012  . Smokeless tobacco: Never Used  . Alcohol Use: No  . Drug Use: No  . Sexually Active: Not on file     1 cig to 1/4 of a pack a day   Other Topics Concern  . Not on file   Social History Narrative  . No narrative on file    ROS:  As stated in the HPI and negative for all other systems.   PHYSICAL EXAM BP 180/90  Pulse 59  Ht 5\' 5"  (1.651 m)  Wt 170 lb 1.9 oz (77.166 kg)  BMI 28.31 kg/m2 GENERAL:  Well appearing HEENT:  Pupils equal round and reactive, fundi not visualized, oral mucosa unremarkable, dentures NECK:  No jugular venous distention, waveform within normal limits, carotid upstroke brisk and symmetric, no bruits, no thyromegaly LYMPHATICS:  No cervical, inguinal adenopathy LUNGS:  Clear to auscultation bilaterally BACK:  No CVA tenderness CHEST:   Unremarkable HEART:  PMI not displaced or sustained,S1 and S2 within normal limits, no S3, no S4, no clicks, no rubs, no murmurs ABD:  Flat, positive bowel sounds normal in frequency in pitch, no bruits, no rebound, no guarding, no midline pulsatile mass, no hepatomegaly, no splenomegaly EXT:  2 plus pulses throughout, no edema, no cyanosis no clubbing SKIN:  No rashes no nodules NEURO:  Cranial nerves II through XII grossly intact, motor grossly intact throughout PSYCH:  Cognitively intact, oriented to person place and time   EKG:  Sinus rhythm, rate 59, incomplete right bundle branch block, no acute ST-T wave changes. 04/28/2012   ASSESSMENT AND PLAN

## 2012-05-06 NOTE — CV Procedure (Signed)
   Cardiac Catheterization Operative Report  [name] [id number] 6/19/201311:53 AM [name], MD  Procedure Performed:  1. PTCA/DES x 1 proximal Circumflex.   Operator: Verne Carrow, MD  Indication:  Unstable angina. Pt underwent diagnostic cath last week and was found to have a severe stenosis in the proximal Circumflex artery. Staged PCI today.                                       Procedure Details: The risks, benefits, complications, treatment options, and expected outcomes were discussed with the patient. The patient and/or family concurred with the proposed plan, giving informed consent. The patient was brought to the cath lab after IV hydration was begun and oral premedication was given. The patient was further sedated with Versed.  The right groin was prepped and draped in the usual manner. Using the modified Seldinger access technique, a 6 French sheath was placed in the right femoral artery. She was given a bolus of Angiomax and a drip was started. I then engaged the left main with a XB 3.0 guiding catheter. When the ACT was greater than 200, I passed a Cougar IC wire down the Circumflex. I then used a 2.5 x 12 mm balloon to pre-dilate the stenosis. A 3.0 x 16 mm Promus Element DES was carefully positioned in the proximal vessel and deployed without difficulty. I then post-dilated the stent with a 3.0 x 12 mm Bristol balloon x 1 at high pressures. There was an excellent angiographic result. The stenosis was taken from 90% down to 0%.   There were no immediate complications. The patient was taken to the recovery area in stable condition.   Hemodynamic Findings: Central aortic pressure: 145/72  Impression: 1. Successful PTCA/DES x 1 proximal Circumflex artery  Recommendations: She will need dual antiplatelet therapy with ASA and Plavix for at least one year.        Complications:  None; patient tolerated the procedure well.

## 2012-05-07 DIAGNOSIS — I200 Unstable angina: Secondary | ICD-10-CM | POA: Diagnosis not present

## 2012-05-07 DIAGNOSIS — Z9861 Coronary angioplasty status: Secondary | ICD-10-CM

## 2012-05-07 LAB — CBC
Hemoglobin: 14.1 g/dL (ref 12.0–15.0)
MCH: 31.7 pg (ref 26.0–34.0)
MCHC: 34.1 g/dL (ref 30.0–36.0)
MCV: 93 fL (ref 78.0–100.0)
RBC: 4.45 MIL/uL (ref 3.87–5.11)

## 2012-05-07 LAB — BASIC METABOLIC PANEL
CO2: 26 mEq/L (ref 19–32)
Calcium: 9.4 mg/dL (ref 8.4–10.5)
Creatinine, Ser: 1.08 mg/dL (ref 0.50–1.10)
GFR calc non Af Amer: 49 mL/min — ABNORMAL LOW (ref >90)
Glucose, Bld: 111 mg/dL — ABNORMAL HIGH (ref 70–99)
Sodium: 136 mEq/L (ref 135–145)

## 2012-05-07 MED ORDER — MAGNESIUM HYDROXIDE 400 MG/5ML PO SUSP
30.0000 mL | Freq: Every day | ORAL | Status: DC | PRN
  Administered 2012-05-07: 30 mL via ORAL
  Filled 2012-05-07: qty 30

## 2012-05-07 MED ORDER — ALUM & MAG HYDROXIDE-SIMETH 200-200-20 MG/5ML PO SUSP
30.0000 mL | Freq: Four times a day (QID) | ORAL | Status: DC | PRN
  Administered 2012-05-07: 30 mL via ORAL
  Filled 2012-05-07: qty 30

## 2012-05-07 MED ORDER — CLOPIDOGREL BISULFATE 75 MG PO TABS: 75.0000 mg | ORAL_TABLET | Freq: Every day | ORAL | Status: DC

## 2012-05-07 MED ORDER — NITROGLYCERIN 0.4 MG SL SUBL: 0.4000 mg | SUBLINGUAL_TABLET | SUBLINGUAL | Status: DC | PRN

## 2012-05-07 MED FILL — Dextrose Inj 5%: INTRAVENOUS | Qty: 50 | Status: AC

## 2012-05-07 NOTE — Progress Notes (Signed)
CARDIAC REHAB PHASE I   PRE:  Rate/Rhythm: 68 SR  BP:  Supine:   Sitting: 116/74  Standing:   SaO2:   MODE:  Ambulation: 680 ft   POST:  Rate/Rhythem: 83 SR  BP:  Supine:   Sitting: 133/80  Standing:    SaO2:  0915-1005 Tolerated ambulation well without c/o of cp or SOB. VS stable. Completed discharge education with pt. She agrees to [employer]. CRP in [place], will send referral.  Beatrix Fetters

## 2012-05-07 NOTE — Progress Notes (Signed)
SUBJECTIVE:  No chest pain.  No groin pain.  Mild dyspnea   PHYSICAL EXAM Filed Vitals:   05/06/12 2358 05/07/12 0330 05/07/12 0406 05/07/12 0640  BP: 127/55 122/66  143/68  Pulse: 73 64  59  Temp: 97.6 F (36.4 C) 97.7 F (36.5 C)    TempSrc: Oral Oral    Resp:  18  18  Height:      Weight:   162 lb 4.1 oz (73.6 kg)   SpO2: 95% 95%  96%   General:  No distress Lungs:  Few expiratory wheezes Heart:  RRR Abdomen:  Positive bowel sounds, no rebound no guarding Extremities:  Right groin OK  LABS:  Results for orders placed during the hospital encounter of 05/06/12 (from the past 24 hour(s))  CBC     Status: Abnormal   Collection Time   05/06/12  8:51 AM      Component Value Range   WBC 13.6 (*) 4.0 - 10.5 K/uL   RBC 4.69  3.87 - 5.11 MIL/uL   Hemoglobin 15.1 (*) 12.0 - 15.0 g/dL   HCT 19.1  47.8 - 29.5 %   MCV 91.7  78.0 - 100.0 fL   MCH 32.2  26.0 - 34.0 pg   MCHC 35.1  30.0 - 36.0 g/dL   RDW 62.1  30.8 - 65.7 %   Platelets 240  150 - 400 K/uL  BASIC METABOLIC PANEL     Status: Abnormal   Collection Time   05/06/12  8:51 AM      Component Value Range   Sodium 136  135 - 145 mEq/L   Potassium 3.3 (*) 3.5 - 5.1 mEq/L   Chloride 99  96 - 112 mEq/L   CO2 24  19 - 32 mEq/L   Glucose, Bld 156 (*) 70 - 99 mg/dL   BUN 21  6 - 23 mg/dL   Creatinine, Ser 8.46  0.50 - 1.10 mg/dL   Calcium 9.9  8.4 - 96.2 mg/dL   GFR calc non Af Amer 48 (*) >90 mL/min   GFR calc Af Amer 56 (*) >90 mL/min  POCT ACTIVATED CLOTTING TIME     Status: Normal   Collection Time   05/06/12 11:41 AM      Component Value Range   Activated Clotting Time 609    POCT ACTIVATED CLOTTING TIME     Status: Normal   Collection Time   05/06/12  2:08 PM      Component Value Range   Activated Clotting Time 169    CBC     Status: Abnormal   Collection Time   05/07/12  4:20 AM      Component Value Range   WBC 14.9 (*) 4.0 - 10.5 K/uL   RBC 4.45  3.87 - 5.11 MIL/uL   Hemoglobin 14.1  12.0 - 15.0 g/dL   HCT 95.2  84.1 - 32.4 %   MCV 93.0  78.0 - 100.0 fL   MCH 31.7  26.0 - 34.0 pg   MCHC 34.1  30.0 - 36.0 g/dL   RDW 40.1  02.7 - 25.3 %   Platelets 225  150 - 400 K/uL  BASIC METABOLIC PANEL     Status: Abnormal   Collection Time   05/07/12  4:20 AM      Component Value Range   Sodium 136  135 - 145 mEq/L   Potassium 4.3  3.5 - 5.1 mEq/L   Chloride 99  96 - 112 mEq/L  CO2 26  19 - 32 mEq/L   Glucose, Bld 111 (*) 70 - 99 mg/dL   BUN 29 (*) 6 - 23 mg/dL   Creatinine, Ser 4.09  0.50 - 1.10 mg/dL   Calcium 9.4  8.4 - 81.1 mg/dL   GFR calc non Af Amer 49 (*) >90 mL/min   GFR calc Af Amer 57 (*) >90 mL/min    Intake/Output Summary (Last 24 hours) at 05/07/12 0759 Last data filed at 05/07/12 0005  Gross per 24 hour  Intake    625 ml  Output    300 ml  Net    325 ml    EKG:  No acute changes 05/07/2012  ASSESSMENT AND PLAN:  CAD:  PCI with DES to circ.  OK to discharge.  Meds as listed  Leukocytosis:  No signs or symptoms of infection.  She has some chronic lung disease and cough but this is no different.  Follow up CBC in the office.  HTN:  BP somewhat labile.  Continue current therapy.  Risk reduction:  She has been intolerant of statins.  I will follow her lipids as an outpatient.  Fayrene Fearing [facility] 05/07/2012 7:59 AM

## 2012-05-07 NOTE — Progress Notes (Signed)
Nutrition Brief Note  RD pulled to patient due to Nutrition Risk Report: problems chewing or swallowing foods and/or liquids per admission nutrition screen. Reports having her esophagus stretched and pills sometimes get "caught up" in her throat. Patient states she has a good appetite; PO intake 100% on a Heart Healthy diet. BMI = 28.3 kg/m2. No nutrition intervention indicated at this time. Please consult RD as needed.  Alger Memos, RD, LDN Pager #: [phone]

## 2012-05-07 NOTE — Discharge Summary (Signed)
CARDIOLOGY DISCHARGE SUMMARY   Patient ID: [name] MRN: [id number] DOB/AGE: 19-Nov-1934 77 y.o.  Admit date: 05/06/2012 Discharge date: 05/07/2012  Primary Discharge Diagnosis:  Progressive anginal pain - status post 3.0 x 16 mm Promus Element DES to the proximal circumflex  Secondary Discharge Diagnosis:  Past Medical History  Diagnosis Date  . Hyperlipemia   . Unspecified essential hypertension   . GERD (gastroesophageal reflux disease)   . CAD (coronary artery disease)     2005 EF 55-60%. Left main normal. LAD proximal patent stent. First diagonal large with 50-60% stenosis. At the time of this cath there was a 90% stenosis at the second diagonal which was treated with stenting. This was a cipher stent. The circumflex had 50% stenosis. The right coronary artery was widely patent.  Marland Kitchen COPD (chronic obstructive pulmonary disease)   . Anginal pain   . Hard of hearing    Nausea    Dysphagia    GERD Sx   . Arthritis     "aching bones sometimes"   Procedures: Left Heart Catheterization, PTCA/DES x 1 proximal Circumflex.  Hospital Course: [name] is a 77 year old female with a long history of coronary artery disease. She was previously followed by Dr. Deborah Chalk. She was seen by Dr. Antoine Poche on 04/28/2012 and a cardiac catheterization was recommended for progressive angina. She came to the [facility] lab for the procedure on 05/01/2012. She had a 90% stenosis in the proximal circumflex with otherwise minimal disease and her EF was 60-65%. She was scheduled in the main cath lab for percutaneous intervention. She came to the hospital for the procedure on 05/06/2012. She has a history of dye allergy and was pretreated for this.  She had successful insertion of the stent listed above to the proximal circumflex without complication. She was continued on her pre--admission aspirin, Plavix and beta blocker. She has a history of allergy to Lipitor and a statin was not added to her medication  regimen.  On 05/07/2012, [name] was seen by Dr. Antoine Poche. Her labs were reviewed and it was felt the mild leukocytosis was secondary to steroids administered for dye allergy. She was also seen by cardiac rehabilitation. She was ambulating without chest pain or shortness of breath and considered stable for discharge, to followup as an outpatient.  Labs:   Lab Results  Component Value Date   WBC 14.9* 05/07/2012   HGB 14.1 05/07/2012   HCT 41.4 05/07/2012   MCV 93.0 05/07/2012   PLT 225 05/07/2012    Lab 05/07/12 0420  NA 136  K 4.3  CL 99  CO2 26  BUN 29*  CREATININE 1.08  CALCIUM 9.4  PROT --  BILITOT --  ALKPHOS --  ALT --  AST --  GLUCOSE 111*   Cardiac Cath:  Impression:  1. Successful PTCA/DES x 1 proximal Circumflex artery with a 3.0 x 16 mm Promus Element DES Recommendations: She will need dual antiplatelet therapy with ASA and Plavix for at least one year.  Complications: None; patient tolerated the procedure well.  EKG: 07-May-2012 04:02:17  Sinus bradycardia Incomplete right bundle branch block Borderline ECG 45mm/s 4mm/mV 100Hz  8.0.1 12SL 239 CID: 1 Referred by: CHRISTOPHE MCALHANY Unconfirmed Vent. rate 59 BPM PR interval 148 ms QRS duration 100 ms QT/QTc 434/429 ms P-R-T axes 45 77 68  FOLLOW UP PLANS AND APPOINTMENTS Allergies  Allergen Reactions  . Iohexol      Code: HIVES, Desc: PER ROBIN @ [facility], PT IS ALLERGIC TO IVP  DYE 08/28/10/RM, Onset Date: [id number]   . Lipitor (Atorvastatin Calcium) Hives    Hives per pt. She states she thinks this is the right medicine   Medication List  As of 05/07/2012 10:19 AM   TAKE these medications         cholecalciferol 1000 UNITS tablet   Commonly known as: VITAMIN D   Take 1,000 Units by mouth daily.      clopidogrel 75 MG tablet   Commonly known as: PLAVIX   Take 1 tablet (75 mg total) by mouth daily.      hydrochlorothiazide 25 MG tablet   Commonly known as: HYDRODIURIL   Take 25 mg by mouth daily.        HYDROcodone-acetaminophen 10-325 MG per tablet   Commonly known as: NORCO   Take 1 tablet by mouth every 4 (four) hours as needed. For pain      hydrocortisone 25 MG suppository   Commonly known as: ANUSOL-HC   Place 1 suppository (25 mg total) rectally every 12 (twelve) hours.      isosorbide mononitrate 30 MG 24 hr tablet   Commonly known as: IMDUR   Take 1 tablet (30 mg total) by mouth daily.      nitroGLYCERIN 0.4 MG SL tablet   Commonly known as: NITROSTAT   Place 1 tablet (0.4 mg total) under the tongue every 5 (five) minutes as needed for chest pain.      promethazine 25 MG tablet   Commonly known as: PHENERGAN   Take 25 mg by mouth every 4 (four) hours as needed. For nausea      propranolol 40 MG tablet   Commonly known as: INDERAL   Take 40 mg by mouth 2 (two) times daily.      ranitidine 300 MG tablet   Commonly known as: ZANTAC   Take 300 mg by mouth daily.            Discharge Orders    Future Appointments: Provider: Department: Dept Phone: Center:   05/14/2012 3:30 PM Rollene Rotunda, MD [facility] [phone] [facility]     Future Orders Please Complete By Expires   Amb Referral to Cardiac Rehabilitation        Follow-up Information    Follow up with Rollene Rotunda, MD. (June 27th at 3:30 PM)    Contact information:   [address], [address] [place] [address] [phone]          BRING ALL MEDICATIONS WITH YOU TO FOLLOW UP APPOINTMENTS  Time spent with patient to include physician time: 42 min Signed: Theodore Demark 05/07/2012, 10:19 AM Co-Sign MD  Patient seen and examined.  Plan as discussed in my rounding note for today and outlined above. Fayrene Fearing [facility]  05/07/2012  4:04 PM

## 2012-05-07 NOTE — Consult Note (Signed)
Pt states she quit one month ago and she was a 1 ppd smoker. She has remained tobacco free and plans to stay that way. Congratulated and encouraged pt to remain smoke free. Discussed and reviewed relapse prevention strategies. Referred to 1-800 quit now for f/u and support. Discussed oral fixation substitutes, second hand smoke and in home smoking policy. Reviewed and gave pt Written education/contact information.

## 2012-05-07 NOTE — Discharge Instructions (Signed)
PLEASE REMEMBER TO BRING ALL OF YOUR MEDICATIONS TO EACH OF YOUR FOLLOW-UP OFFICE VISITS.  PLEASE ATTEND ALL SCHEDULED FOLLOW-UP APPOINTMENTS.   Activity: Increase activity slowly as tolerated. You may shower, but no soaking baths (or swimming) for 1 week. No driving for 2 days. No lifting over 5 lbs for 1 week. No sexual activity for 1 week.   You May Return to Work: in 1 week (if applicable)  Wound Care: You may wash cath site gently with soap and water. Keep cath site clean and dry. If you notice pain, swelling, bleeding or pus at your cath site, please call [phone].    Groin Site Care Refer to this sheet in the next few weeks. These instructions provide you with information on caring for yourself after your procedure. Your caregiver may also give you more specific instructions. Your treatment has been planned according to current medical practices, but problems sometimes occur. Call your caregiver if you have any problems or questions after your procedure. HOME CARE INSTRUCTIONS  You may shower 24 hours after the procedure. Remove the bandage (dressing) and gently wash the site with plain soap and water. Gently pat the site dry.   Do not apply powder or lotion to the site.   Do not sit in a bathtub, swimming pool, or whirlpool for 5 to 7 days.   No bending, squatting, or lifting anything over 10 pounds (4.5 kg) as directed by your caregiver.   Inspect the site at least twice daily.   Do not drive home if you are discharged the same day of the procedure. Have someone else drive you.   You may drive 24 hours after the procedure unless otherwise instructed by your caregiver.  What to expect:  Any bruising will usually fade within 1 to 2 weeks.   Blood that collects in the tissue (hematoma) may be painful to the touch. It should usually decrease in size and tenderness within 1 to 2 weeks.  SEEK IMMEDIATE MEDICAL CARE IF:  You have unusual pain at the groin site or down the  affected leg.   You have redness, warmth, swelling, or pain at the groin site.   You have drainage (other than a small amount of blood on the dressing).   You have chills.   You have a fever or persistent symptoms for more than 72 hours.   You have a fever and your symptoms suddenly get worse.   Your leg becomes pale, cool, tingly, or numb.   You have heavy bleeding from the site. Hold pressure on the site.  Document Released: 12/07/2010 Document Revised: 10/24/2011 Document Reviewed: 12/07/2010 [employer] Patient Information 2012 [employer].  

## 2012-05-08 ENCOUNTER — Ambulatory Visit: Payer: Medicare Other | Admitting: Cardiology

## 2012-05-14 ENCOUNTER — Ambulatory Visit (INDEPENDENT_AMBULATORY_CARE_PROVIDER_SITE_OTHER): Payer: Medicare Other | Admitting: Cardiology

## 2012-05-14 ENCOUNTER — Encounter: Payer: Self-pay | Admitting: Cardiology

## 2012-05-14 VITALS — BP 156/77 | HR 63 | Ht 65.0 in | Wt 172.8 lb

## 2012-05-14 DIAGNOSIS — I10 Essential (primary) hypertension: Secondary | ICD-10-CM | POA: Insufficient documentation

## 2012-05-14 DIAGNOSIS — D72829 Elevated white blood cell count, unspecified: Secondary | ICD-10-CM

## 2012-05-14 DIAGNOSIS — I2510 Atherosclerotic heart disease of native coronary artery without angina pectoris: Secondary | ICD-10-CM

## 2012-05-14 DIAGNOSIS — E785 Hyperlipidemia, unspecified: Secondary | ICD-10-CM

## 2012-05-14 NOTE — Assessment & Plan Note (Signed)
I have asked her to get a fasting lipid profile done her primary provider's office and I will review this.

## 2012-05-14 NOTE — Patient Instructions (Addendum)
Please have fasting blood work at your primary care doctor's office (Fasting lipid and CBC)  Continue all medications as listed.  Follow up in 4 months with Dr Antoine Poche.  You will receive a letter in the mail 2 months before you are due.  Please call us when you receive this letter to schedule your follow up appointment.

## 2012-05-14 NOTE — Progress Notes (Signed)
HPI The patient presents for follow up of CAD. She's had a history of coronary disease with multiple stents. She's had some complaints which she thinks is related to her GI system but she couldn't tolerate recent therapies. For me she describes some arm discomfort. This is consistent with previous angina. I sent her for a cardiac catheterization which did demonstrate high-grade circumflex lesion but patent right coronary and LAD stents as described below. She has stage percutaneous revascularization of the circumflex. He did well with this. She doesn't think she's had the same had arm pain. However, she has multiple somatic complaints and isn't quite sure how much things change since the angioplasty. She's describing still some sporadic substernal chest burning. She's not describing any new shortness of breath, PND or orthopnea. She's not had no new palpitations, presyncope or syncope. She has some tingling in her left fingers.  Allergies  Allergen Reactions  . Iohexol      Code: HIVES, Desc: PER ROBIN @ GI, PT IS ALLERGIC TO IVP DYE 08/28/10/RM, Onset Date: 16109604   . Lipitor (Atorvastatin Calcium) Hives    Hives per pt. She states she thinks this is the right medicine    Current Outpatient Prescriptions  Medication Sig Dispense Refill  . cholecalciferol (VITAMIN D) 1000 UNITS tablet Take 1,000 Units by mouth daily.       . clopidogrel (PLAVIX) 75 MG tablet Take 1 tablet (75 mg total) by mouth daily.  30 tablet  11  . hydrochlorothiazide (HYDRODIURIL) 25 MG tablet Take 25 mg by mouth daily.       Marland Kitchen HYDROcodone-acetaminophen (NORCO) 10-325 MG per tablet Take 1 tablet by mouth every 4 (four) hours as needed. For pain      . isosorbide mononitrate (IMDUR) 30 MG 24 hr tablet Take 1 tablet (30 mg total) by mouth daily.  30 tablet  6  . nitroGLYCERIN (NITROSTAT) 0.4 MG SL tablet Place 1 tablet (0.4 mg total) under the tongue every 5 (five) minutes as needed for chest pain.  25 tablet  2  .  promethazine (PHENERGAN) 25 MG tablet Take 25 mg by mouth every 4 (four) hours as needed. For nausea      . propranolol (INDERAL) 40 MG tablet Take 40 mg by mouth 2 (two) times daily.      . ranitidine (ZANTAC) 300 MG tablet Take 300 mg by mouth daily.      Marland Kitchen DISCONTD: calcium carbonate (OS-CAL) 600 MG TABS Take 600 mg by mouth daily.        Marland Kitchen DISCONTD: pravastatin (PRAVACHOL) 40 MG tablet Take 40 mg by mouth daily.          Past Medical History  Diagnosis Date  . Hyperlipemia   . Unspecified essential hypertension   . GERD (gastroesophageal reflux disease)   . CAD (coronary artery disease)     Multiple percutaneous revascularization. Catheterization May 2013 left main normal, patent LAD stent, 90% circumflex stenosis, patent right coronary artery stents. Patient had a DES to the circumflex. The EF was well-preserved.  Marland Kitchen COPD (chronic obstructive pulmonary disease)   . Anginal pain   . Hard of hearing   . Arthritis     "aching bones sometimes"    Past Surgical History  Procedure Date  . Lumbar spine surgery 04/2011    Rods, screws and cages  . Back surgery   . Abdominal hysterectomy   . Coronary angioplasty with stent placement 2002-2009  . Coronary angioplasty with stent placement 05/06/12    "  1; this makes 5"  . Esophageal dilation ~ 2012    Dr. Arlyce Dice    ROS:  As stated in the HPI and negative for all other systems.   PHYSICAL EXAM BP 156/77  Pulse 63  Ht 5\' 5"  (1.651 m)  Wt 172 lb 12.8 oz (78.382 kg)  BMI 28.76 kg/m2 GENERAL:  Well appearing HEENT:  Pupils equal round and reactive, fundi not visualized, oral mucosa unremarkable, dentures NECK:  No jugular venous distention, waveform within normal limits, carotid upstroke brisk and symmetric, no bruits, no thyromegaly LYMPHATICS:  No cervical, inguinal adenopathy LUNGS:  Clear to auscultation bilaterally BACK:  No CVA tenderness CHEST:  Unremarkable HEART:  PMI not displaced or sustained,S1 and S2 within normal  limits, no S3, no S4, no clicks, no rubs, no murmurs ABD:  Flat, positive bowel sounds normal in frequency in pitch, no bruits, no rebound, no guarding, no midline pulsatile mass, no hepatomegaly, no splenomegaly EXT:  2 plus pulses throughout, no edema, left groin without bruit or pulsatile mass.   ASSESSMENT AND PLAN

## 2012-05-14 NOTE — Assessment & Plan Note (Signed)
The patient did have a mildly elevated white blood cell count without focal or localizing site of infection. She still has no fevers chills or localizing findings. She will have a repeat CBC.

## 2012-05-14 NOTE — Assessment & Plan Note (Signed)
Her blood pressure is somewhat labile. I reviewed her blood pressures in the hospital and they were somewhat labile there as well. She'll keep a blood pressure diary. For now she will continue the meds as listed.

## 2012-05-14 NOTE — Assessment & Plan Note (Signed)
I would have no reason to suspect any problem with her coronaries. Some of her somatic complaints are clearly not related. She'll need aggressive risk reduction.

## 2012-05-28 ENCOUNTER — Encounter: Payer: Self-pay | Admitting: Cardiology

## 2012-05-28 ENCOUNTER — Encounter: Payer: Self-pay | Admitting: Cardiovascular Disease

## 2012-07-24 ENCOUNTER — Encounter: Payer: Self-pay | Admitting: Cardiology

## 2012-09-11 ENCOUNTER — Encounter: Payer: Self-pay | Admitting: Cardiology

## 2012-09-11 ENCOUNTER — Ambulatory Visit (INDEPENDENT_AMBULATORY_CARE_PROVIDER_SITE_OTHER): Payer: Medicare Other | Admitting: Cardiology

## 2012-09-11 VITALS — BP 172/85 | HR 57 | Ht 64.0 in | Wt 176.1 lb

## 2012-09-11 DIAGNOSIS — I2510 Atherosclerotic heart disease of native coronary artery without angina pectoris: Secondary | ICD-10-CM

## 2012-09-11 DIAGNOSIS — E785 Hyperlipidemia, unspecified: Secondary | ICD-10-CM

## 2012-09-11 DIAGNOSIS — I10 Essential (primary) hypertension: Secondary | ICD-10-CM

## 2012-09-11 MED ORDER — CLOPIDOGREL BISULFATE 75 MG PO TABS: 75.0000 mg | ORAL_TABLET | Freq: Every day | ORAL | Status: DC

## 2012-09-11 NOTE — Patient Instructions (Addendum)
Please re-start Plavix Continue all other medicaitons as listed.  Follow up in 6 months with Dr Antoine Poche.  You will receive a letter in the mail 2 months before you are due.  Please call us when you receive this letter to schedule your follow up appointment.

## 2012-09-11 NOTE — Progress Notes (Signed)
HPI The patient presents for follow up of CAD. She's had a history of coronary disease with multiple stents.  Since I last saw her she has had no new cardiovascular complaints. However, she stopped her Plavix sometime ago as she thought it was contributing to some pain in the popliteal area of her right leg. She's not describing any of the chest discomfort that she's had previously. She's not been having any palpitations, presyncope or syncope. She denies any PND or orthopnea. She's had no weight gain or edema. She does report that she hurts all over.  She has had some recent problems with "gas".  Allergies  Allergen Reactions  . Iohexol      Code: HIVES, Desc: PER ROBIN @ GI, PT IS ALLERGIC TO IVP DYE 08/28/10/RM, Onset Date: 16109604   . Lipitor (Atorvastatin Calcium) Hives    Hives per pt. She states she thinks this is the right medicine    Current Outpatient Prescriptions  Medication Sig Dispense Refill  . cholecalciferol (VITAMIN D) 1000 UNITS tablet Take 1,000 Units by mouth daily.       . hydrochlorothiazide (HYDRODIURIL) 25 MG tablet Take 25 mg by mouth daily.       Marland Kitchen HYDROcodone-acetaminophen (NORCO) 10-325 MG per tablet Take 1 tablet by mouth every 4 (four) hours as needed. For pain      . isosorbide mononitrate (IMDUR) 30 MG 24 hr tablet Take 1 tablet (30 mg total) by mouth daily.  30 tablet  6  . nitroGLYCERIN (NITROSTAT) 0.4 MG SL tablet Place 1 tablet (0.4 mg total) under the tongue every 5 (five) minutes as needed for chest pain.  25 tablet  2  . omeprazole (PRILOSEC) 20 MG capsule Take 20 mg by mouth daily.      . promethazine (PHENERGAN) 25 MG tablet Take 25 mg by mouth every 4 (four) hours as needed. For nausea      . propranolol (INDERAL) 40 MG tablet Take 40 mg by mouth 2 (two) times daily.      . ranitidine (ZANTAC) 300 MG tablet Take 300 mg by mouth daily.      . clopidogrel (PLAVIX) 75 MG tablet Take 1 tablet (75 mg total) by mouth daily.  30 tablet  11  . DISCONTD:  calcium carbonate (OS-CAL) 600 MG TABS Take 600 mg by mouth daily.        Marland Kitchen DISCONTD: pravastatin (PRAVACHOL) 40 MG tablet Take 40 mg by mouth daily.          Past Medical History  Diagnosis Date  . Hyperlipemia   . Unspecified essential hypertension   . GERD (gastroesophageal reflux disease)   . CAD (coronary artery disease)     Multiple percutaneous revascularization. Catheterization May 2013 left main normal, patent LAD stent, 90% circumflex stenosis, patent right coronary artery stents. Patient had a DES to the circumflex. The EF was well-preserved.  Marland Kitchen COPD (chronic obstructive pulmonary disease)   . Anginal pain   . Hard of hearing   . Arthritis     "aching bones sometimes"    Past Surgical History  Procedure Date  . Lumbar spine surgery 04/2011    Rods, screws and cages  . Back surgery   . Abdominal hysterectomy   . Coronary angioplasty with stent placement 2002-2009  . Coronary angioplasty with stent placement 05/06/12    "1; this makes 5"  . Esophageal dilation ~ 2012    Dr. Arlyce Dice    ROS:  As stated in the  HPI and negative for all other systems.   PHYSICAL EXAM BP 172/85  Pulse 57  Ht 5\' 4"  (1.626 m)  Wt 176 lb 1.9 oz (79.888 kg)  BMI 30.23 kg/m2 GENERAL:  Well appearing HEENT:  Pupils equal round and reactive, fundi not visualized, oral mucosa unremarkable, dentures NECK:  No jugular venous distention, waveform within normal limits, carotid upstroke brisk and symmetric, no bruits, no thyromegaly LYMPHATICS:  No cervical, inguinal adenopathy LUNGS:  Clear to auscultation bilaterally BACK:  No CVA tenderness CHEST:  Unremarkable HEART:  PMI not displaced or sustained,S1 and S2 within normal limits, no S3, no S4, no clicks, no rubs, no murmurs ABD:  Flat, positive bowel sounds normal in frequency in pitch, no bruits, no rebound, no guarding, no midline pulsatile mass, no hepatomegaly, no splenomegaly EXT:  2 plus pulses throughout, no edema, left groin without  bruit or pulsatile mass.  EKG - Sinus rhythm, rate 57, axis within normal limits, intervals within normal limits, no acute ST-T wave changes.  09/11/2012   ASSESSMENT AND PLAN   CAD -  She is having no symptoms consistent with previous angina. Unfortunately she stopped the Plavix on her own and I have encouraged her to restart this. If she gets the leg pain that she thought was related to the Plavix I will switch her to appendectomy and for the remainder of her 12 months.   Hyperlipemia -  We will call Dr. Hennie Duos office to get the last lipid profile.   HTN (hypertension) -  Her blood pressure has been labile in the past but she reports it's typically controlled at home. No change in therapy is indicated.

## 2012-11-20 ENCOUNTER — Other Ambulatory Visit: Payer: Self-pay | Admitting: Cardiology

## 2013-01-30 ENCOUNTER — Inpatient Hospital Stay (HOSPITAL_COMMUNITY): Payer: Medicare Other

## 2013-01-30 ENCOUNTER — Emergency Department (HOSPITAL_COMMUNITY): Payer: Medicare Other

## 2013-01-30 ENCOUNTER — Inpatient Hospital Stay (HOSPITAL_COMMUNITY)
Admission: EM | Admit: 2013-01-30 | Discharge: 2013-02-03 | DRG: 191 | Disposition: A | Discharge status: Home Health | Payer: Medicare Other | Attending: Internal Medicine | Admitting: Internal Medicine

## 2013-01-30 ENCOUNTER — Encounter (HOSPITAL_COMMUNITY): Payer: Self-pay | Admitting: *Deleted

## 2013-01-30 DIAGNOSIS — E876 Hypokalemia: Secondary | ICD-10-CM | POA: Diagnosis present

## 2013-01-30 DIAGNOSIS — I2510 Atherosclerotic heart disease of native coronary artery without angina pectoris: Secondary | ICD-10-CM | POA: Diagnosis present

## 2013-01-30 DIAGNOSIS — J441 Chronic obstructive pulmonary disease with (acute) exacerbation: Principal | ICD-10-CM | POA: Diagnosis present

## 2013-01-30 DIAGNOSIS — E785 Hyperlipidemia, unspecified: Secondary | ICD-10-CM | POA: Diagnosis present

## 2013-01-30 DIAGNOSIS — Z79899 Other long term (current) drug therapy: Secondary | ICD-10-CM

## 2013-01-30 DIAGNOSIS — K5900 Constipation, unspecified: Secondary | ICD-10-CM | POA: Diagnosis present

## 2013-01-30 DIAGNOSIS — F17200 Nicotine dependence, unspecified, uncomplicated: Secondary | ICD-10-CM | POA: Diagnosis present

## 2013-01-30 DIAGNOSIS — Z9981 Dependence on supplemental oxygen: Secondary | ICD-10-CM

## 2013-01-30 DIAGNOSIS — R109 Unspecified abdominal pain: Secondary | ICD-10-CM | POA: Diagnosis present

## 2013-01-30 DIAGNOSIS — I10 Essential (primary) hypertension: Secondary | ICD-10-CM | POA: Diagnosis present

## 2013-01-30 DIAGNOSIS — H9190 Unspecified hearing loss, unspecified ear: Secondary | ICD-10-CM | POA: Diagnosis present

## 2013-01-30 DIAGNOSIS — J9610 Chronic respiratory failure, unspecified whether with hypoxia or hypercapnia: Secondary | ICD-10-CM | POA: Diagnosis present

## 2013-01-30 DIAGNOSIS — M129 Arthropathy, unspecified: Secondary | ICD-10-CM | POA: Diagnosis present

## 2013-01-30 DIAGNOSIS — K219 Gastro-esophageal reflux disease without esophagitis: Secondary | ICD-10-CM | POA: Diagnosis present

## 2013-01-30 DIAGNOSIS — Z9861 Coronary angioplasty status: Secondary | ICD-10-CM

## 2013-01-30 LAB — CBC WITH DIFFERENTIAL/PLATELET
Eosinophils Relative: 2 % (ref 0–5)
HCT: 38.6 % (ref 36.0–46.0)
Hemoglobin: 13.5 g/dL (ref 12.0–15.0)
Lymphocytes Relative: 19 % (ref 12–46)
Lymphs Abs: 1.6 10*3/uL (ref 0.7–4.0)
MCV: 92.1 fL (ref 78.0–100.0)
Monocytes Absolute: 0.6 10*3/uL (ref 0.1–1.0)
RBC: 4.19 MIL/uL (ref 3.87–5.11)
WBC: 8.3 10*3/uL (ref 4.0–10.5)

## 2013-01-30 LAB — URINALYSIS, ROUTINE W REFLEX MICROSCOPIC
Bilirubin Urine: NEGATIVE
Hgb urine dipstick: NEGATIVE
Ketones, ur: NEGATIVE mg/dL
Nitrite: NEGATIVE
Specific Gravity, Urine: 1.011 (ref 1.005–1.030)
Urobilinogen, UA: 0.2 mg/dL (ref 0.0–1.0)

## 2013-01-30 LAB — LACTIC ACID, PLASMA: Lactic Acid, Venous: 1.9 mmol/L (ref 0.5–2.2)

## 2013-01-30 LAB — POCT I-STAT, CHEM 8
BUN: 14 mg/dL (ref 6–23)
Chloride: 94 mEq/L — ABNORMAL LOW (ref 96–112)
Glucose, Bld: 124 mg/dL — ABNORMAL HIGH (ref 70–99)
HCT: 40 % (ref 36.0–46.0)
Potassium: 3.4 mEq/L — ABNORMAL LOW (ref 3.5–5.1)

## 2013-01-30 MED ORDER — MORPHINE SULFATE 4 MG/ML IJ SOLN
4.0000 mg | Freq: Once | INTRAMUSCULAR | Status: AC
  Administered 2013-01-30: 4 mg via INTRAVENOUS
  Filled 2013-01-30: qty 1

## 2013-01-30 MED ORDER — IOHEXOL 350 MG/ML SOLN: 100.0000 mL | Freq: Once | INTRAVENOUS | Status: AC | PRN

## 2013-01-30 MED ORDER — PREDNISONE 20 MG PO TABS
40.0000 mg | ORAL_TABLET | Freq: Once | ORAL | Status: AC
  Administered 2013-01-30: 40 mg via ORAL
  Filled 2013-01-30: qty 2

## 2013-01-30 MED ORDER — ALBUTEROL (5 MG/ML) CONTINUOUS INHALATION SOLN
10.0000 mg | INHALATION_SOLUTION | RESPIRATORY_TRACT | Status: AC
  Administered 2013-01-30: 10 mg via RESPIRATORY_TRACT
  Filled 2013-01-30: qty 20

## 2013-01-30 MED ORDER — DIPHENHYDRAMINE HCL 50 MG/ML IJ SOLN
50.0000 mg | Freq: Once | INTRAMUSCULAR | Status: AC
  Administered 2013-01-30: 50 mg via INTRAVENOUS
  Filled 2013-01-30: qty 1

## 2013-01-30 NOTE — ED Provider Notes (Signed)
History     CSN: [id number]  Arrival date & time 01/30/13  1752   First MD Initiated Contact with Patient 01/30/13 1759      Chief Complaint  Patient presents with  . Shortness of Breath    (Consider location/radiation/quality/duration/timing/severity/associated sxs/prior treatment) HPI Comments: 78 year old female with a history of COPD, hyperlipidemia, coronary artery disease status post multiple stents in the past who presents with increased shortness of breath. Paramedics were called by the patient's family members who state that she was angry shortness of breath today. The patient states that she is both short of breath also having trouble with constipation having in frequent small hard stools and abdominal distention that requires laxative treatment helped have bowel movements.  The shortness of breath has been persistent, gradually worsening, is now severe and associated with occasional coughing and wheezing. Paramedics found the patient with oxygen saturation of 91% on room air, she does not use her 2 L of oxygen at home 24 hours 7 days a week as she should. Blood pressure was hypertensive, no fever or tachycardia noted  The history is provided by the patient.    Past Medical History  Diagnosis Date  . Hyperlipemia   . Unspecified essential hypertension   . GERD (gastroesophageal reflux disease)   . CAD (coronary artery disease)     Multiple percutaneous revascularization. Catheterization May 2013 left main normal, patent LAD stent, 90% circumflex stenosis, patent right coronary artery stents. Patient had a DES to the circumflex. The EF was well-preserved.  Marland Kitchen COPD (chronic obstructive pulmonary disease)   . Anginal pain   . Hard of hearing   . Arthritis     "aching bones sometimes"    Past Surgical History  Procedure Laterality Date  . Lumbar spine surgery  04/2011    Rods, screws and cages  . Back surgery    . Abdominal hysterectomy    . Coronary angioplasty with  stent placement  2002-2009  . Coronary angioplasty with stent placement  05/06/12    "1; this makes 5"  . Esophageal dilation  ~ 2012    Dr. Arlyce Dice    Family History  Problem Relation Age of Onset  . Glaucoma Father   . Bone cancer Mother     History  Substance Use Topics  . Smoking status: Current Some Day Smoker -- 0.50 packs/day for 55 years    Last Attempt to Quit: 02/29/2012  . Smokeless tobacco: Former Neurosurgeon    Types: Snuff     Comment: "dipped snuff when I was little"  . Alcohol Use: No    OB History   Grav Para Term Preterm Abortions TAB SAB Ect Mult Living                  Review of Systems  All other systems reviewed and are negative.    Allergies  Iohexol and Lipitor  Home Medications   Current Outpatient Rx  Name  Route  Sig  Dispense  Refill  . cholecalciferol (VITAMIN D) 1000 UNITS tablet   Oral   Take 1,000 Units by mouth daily.          . clopidogrel (PLAVIX) 75 MG tablet   Oral   Take 1 tablet (75 mg total) by mouth daily.   30 tablet   11   . hydrochlorothiazide (HYDRODIURIL) 25 MG tablet   Oral   Take 25 mg by mouth daily.          Marland Kitchen HYDROcodone-acetaminophen (NORCO)  10-325 MG per tablet   Oral   Take 1 tablet by mouth every 4 (four) hours as needed. For pain         . isosorbide mononitrate (IMDUR) 30 MG 24 hr tablet   Oral   Take 30 mg by mouth daily.         . nitroGLYCERIN (NITROSTAT) 0.4 MG SL tablet   Sublingual   Place 1 tablet (0.4 mg total) under the tongue every 5 (five) minutes as needed for chest pain.   25 tablet   2   . propranolol (INDERAL) 40 MG tablet   Oral   Take 40 mg by mouth 2 (two) times daily.         . ranitidine (ZANTAC) 300 MG tablet   Oral   Take 300 mg by mouth daily.           BP 152/69  Pulse 76  Temp(Src) 97.4 F (36.3 C) (Oral)  Resp 17  SpO2 94%  Physical Exam  Nursing note and vitals reviewed. Constitutional: She appears well-developed and well-nourished. No  distress.  HENT:  Head: Normocephalic and atraumatic.  Mouth/Throat: Oropharynx is clear and moist. No oropharyngeal exudate.  Eyes: Conjunctivae and EOM are normal. Pupils are equal, round, and reactive to light. Right eye exhibits no discharge. Left eye exhibits no discharge. No scleral icterus.  Neck: Normal range of motion. Neck supple. No JVD present. No thyromegaly present.  Cardiovascular: Normal rate, regular rhythm, normal heart sounds and intact distal pulses.  Exam reveals no gallop and no friction rub.   No murmur heard. Pulmonary/Chest: Effort normal. No respiratory distress. She has wheezes. She has no rales.  Prolonged expiratory phase, slight increased work of breathing, wheezing on expiration in all lung fields, decreased breath sounds in all lung  Abdominal: Soft. Bowel sounds are normal. She exhibits distension ( Diffuse abdominal distention, no focal masses, no tympanitic sounds to percussion). She exhibits no mass.  Mild tenderness to the left abdomen, no pain at McBurney's point, no right upper quadrant tenderness  Musculoskeletal: Normal range of motion. She exhibits no edema and no tenderness.  Lymphadenopathy:    She has no cervical adenopathy.  Neurological: She is alert. Coordination normal.  Skin: Skin is warm and dry. No rash noted. No erythema.  Psychiatric: She has a normal mood and affect. Her behavior is normal.    ED Course  Procedures (including critical care time)  Labs Reviewed  POCT I-STAT, CHEM 8 - Abnormal; Notable for the following:    Sodium 134 (*)    Potassium 3.4 (*)    Chloride 94 (*)    Glucose, Bld 124 (*)    All other components within normal limits  CBC WITH DIFFERENTIAL  LACTIC ACID, PLASMA  URINALYSIS, ROUTINE W REFLEX MICROSCOPIC  POCT I-STAT TROPONIN I   Dg Abd Acute W/chest  01/30/2013  *RADIOLOGY REPORT*  Clinical Data: Bloating.  Upper abdominal pain.  ACUTE ABDOMEN SERIES (ABDOMEN 2 VIEW & CHEST 1 VIEW)  Comparison:  02/27/2012  Findings: Bowel gas pattern does not show evidence of ileus, obstruction or free air.  There is a moderate amount of fecal matter in the colon.  There is been previous lumbar region discectomy and fusion.  One-view chest shows normal heart size.  There is unfolding of the aorta.  There is chronic scarring at the lung bases.  Volumes are somewhat diminished, presumed secondary to poor inspiration.  Some actual basilar atelectasis is not excluded.  IMPRESSION: Bowel  gas pattern within normal limits.  Chronic increased markings at the bases.  This appears slightly more pronounced on the left, presumed secondary to a poor inspiration.  The possibility of some active atelectasis in the left lower lobe is not excluded.   Original Report Authenticated By: Paulina Fusi, M.D.      1. COPD with acute exacerbation   2. Abdominal  pain, other specified site   3. Constipation       MDM  Patient was given Solu-Medrol and albuterol prior to arrival, at this time she has oxygen at 98-100% on oxygen and treatment, as severe COPD and appears to be in mild to moderate distress requiring evaluation with laboratory EKG, chest x-ray. Abdominal discomfort likely from constipation, she does take chronic medications including hydrocodone acetaminophen which could likely contributed to these constipation episodes. She is essentially nontender and no she is distended she does not appear to have a bowel obstruction on my exam. Furthermore she is not vomiting her nauseated and is passing gas and having hard bowel movements. X-rays ordered, continuous nebulizer therapy.  The patient has persistent significant wheezing and dyspnea with tachypnea after getting a continuous nebulizer treatment. She does not have a significant leukocytosis, there is no obvious x-ray findings to suggest pneumonia, and her lites lites appear normal. She has a normal troponin, he does have a moderate amount of stool in the colon which could be  consistent with her ongoing abdominal pain. On repeat evaluation the patient states that she feels uncomfortable because of her dyspnea, it looks like she would benefit from an overnight admission for ongoing steroid therapy and frequent nebulizer treatments.    ED ECG REPORT  I personally interpreted this EKG   Date: 01/30/2013   Rate: 75  Rhythm: normal sinus rhythm  QRS Axis: normal  Intervals: normal  ST/T Wave abnormalities: nonspecific T wave changes  Conduction Disutrbances:IRBBB  Narrative Interpretation:   Old EKG Reviewed: c/w 01/30/13, no sig changes, IRBBB still present    Discussed with hospitalist who will come to the emergency department to evaluate the patient and admit, holding orders requested  Vida Roller, MD 01/30/13 2256

## 2013-01-30 NOTE — ED Notes (Signed)
Patient transported to X-ray 

## 2013-01-30 NOTE — H&P (Signed)
PCP:  [name], MD  Cardiology [place] [facility] MonsSherene Sires?   Chief Complaint:  Shortness of breath and abdominal pain  HPI: [name] is a 78 y.o. female   has a past medical history of Hyperlipemia; Unspecified essential hypertension; GERD (gastroesophageal reflux disease); CAD (coronary artery disease); COPD (chronic obstructive pulmonary disease); Anginal pain; Hard of hearing; and Arthritis.   Presented with  1 day hx of abdominal pain that is diffuse she reports severe constipation. She have not had a BM for the past 3 Days. It seems she has hard time having a BM even when she tries to use a laxative. She takes chronic Vicodin for back pain. Today she also developed sever shortness of breath and wheezing. She has hx of COPD on 2 L of O2 at home. She is not on any inhalers at home. She has some chronic wheezing but today it was worse than her baseline. Patient presented to ED and her wheezing did not resolved despite steroid IV and Nebulizers. Family denies any recent cough. Patient reports she's been having severe pain all away from her neck down into her anal region. She says the pain is so severe she she is feeling that she's going to die. Patient's family felt that she was clammy at home and called 911. Family states this is not her usual pain and she is acting differently from her baseline. Patient has trouble staying still in bed she says because of pain. She have had some nausea but no vomiting.    Review of Systems:     Pertinent positives include: abdominal pain, nausea, shortness of breath at rest.   dyspnea on exertion  Constitutional:  No weight loss, night sweats, Fevers, chills, fatigue, weight loss  HEENT:  No headaches, Difficulty swallowing,Tooth/dental problems,Sore throat,  No sneezing, itching, ear ache, nasal congestion, post nasal drip,  Cardio-vascular:  No chest pain, Orthopnea, PND, anasarca, dizziness, palpitations.no Bilateral lower  extremity swelling  GI:  No heartburn, indigestion,  vomiting, diarrhea, change in bowel habits, loss of appetite, melena, blood in stool, hematemesis Resp:  no , No excess mucus, no productive cough, No non-productive cough, No coughing up of blood.No change in color of mucus.No wheezing. Skin:  no rash or lesions. No jaundice GU:  no dysuria, change in color of urine, no urgency or frequency. No straining to urinate.  No flank pain.  Musculoskeletal:  No joint pain or no joint swelling. No decreased range of motion. No back pain.  Psych:  No change in mood or affect. No depression or anxiety. No memory loss.  Neuro: no localizing neurological complaints, no tingling, no weakness, no double vision, no gait abnormality, no slurred speech, no confusion  Otherwise ROS are negative except for above, 10 systems were reviewed  Past Medical History: Past Medical History  Diagnosis Date  . Hyperlipemia   . Unspecified essential hypertension   . GERD (gastroesophageal reflux disease)   . CAD (coronary artery disease)     Multiple percutaneous revascularization. Catheterization May 2013 left main normal, patent LAD stent, 90% circumflex stenosis, patent right coronary artery stents. Patient had a DES to the circumflex. The EF was well-preserved.  Marland Kitchen COPD (chronic obstructive pulmonary disease)   . Anginal pain   . Hard of hearing   . Arthritis     "aching bones sometimes"   Past Surgical History  Procedure Laterality Date  . Lumbar spine surgery  04/2011    Rods, screws and cages  .  Back surgery    . Abdominal hysterectomy    . Coronary angioplasty with stent placement  2002-2009  . Coronary angioplasty with stent placement  05/06/12    "1; this makes 5"  . Esophageal dilation  ~ 2012    Dr. Arlyce Dice     Medications: Prior to Admission medications   Medication Sig Start Date End Date Taking? Authorizing Provider  cholecalciferol (VITAMIN D) 1000 UNITS tablet Take 1,000 Units by  mouth daily.    Yes Historical Provider, MD  clopidogrel (PLAVIX) 75 MG tablet Take 1 tablet (75 mg total) by mouth daily. 09/11/12  Yes Rollene Rotunda, MD  hydrochlorothiazide (HYDRODIURIL) 25 MG tablet Take 25 mg by mouth daily.    Yes Historical Provider, MD  HYDROcodone-acetaminophen (NORCO) 10-325 MG per tablet Take 1 tablet by mouth every 4 (four) hours as needed. For pain   Yes Historical Provider, MD  isosorbide mononitrate (IMDUR) 30 MG 24 hr tablet Take 30 mg by mouth daily.   Yes Historical Provider, MD  nitroGLYCERIN (NITROSTAT) 0.4 MG SL tablet Place 1 tablet (0.4 mg total) under the tongue every 5 (five) minutes as needed for chest pain. 05/07/12  Yes Rhonda G Barrett, PA-C  propranolol (INDERAL) 40 MG tablet Take 40 mg by mouth 2 (two) times daily.   Yes Historical Provider, MD  ranitidine (ZANTAC) 300 MG tablet Take 300 mg by mouth daily.   Yes Historical Provider, MD    Allergies:   Allergies  Allergen Reactions  . Iohexol      Code: HIVES, Desc: PER ROBIN @ GI, PT IS ALLERGIC TO IVP DYE 08/28/10/RM, Onset Date: 16109604   . Lipitor (Atorvastatin Calcium) Hives    Hives per pt. She states she thinks this is the right medicine    Social History:  Ambulatory   independently   Lives at home with sister, at baseline can perform most of her ADL.    reports that she has been smoking.  She has quit using smokeless tobacco. Her smokeless tobacco use included Snuff. She reports that she does not drink alcohol or use illicit drugs.   Family History: family history includes Bone cancer in her mother and Glaucoma in her father.    Physical Exam: Patient Vitals for the past 24 hrs:  BP Temp Temp src Pulse Resp SpO2  01/30/13 2145 152/69 mmHg - - 76 17 94 %  01/30/13 2115 148/73 mmHg - - 80 26 94 %  01/30/13 2015 139/65 mmHg - - 75 18 93 %  01/30/13 1945 164/134 mmHg - - 76 17 95 %  01/30/13 1845 - - - - - 94 %  01/30/13 1830 116/45 mmHg - - 74 - 100 %  01/30/13 1815 144/93  mmHg - - - - -  01/30/13 1755 173/78 mmHg 97.4 F (36.3 C) Oral 70 20 98 %    1. General:  in No Acute distress 2. Psychological: Alert and Oriented 3. Head/ENT:     Dry Mucous Membranes                          Head Non traumatic, neck supple                          Normal   Dentition 4. SKIN:   decreased Skin turgor,  Skin clean Dry and intact no rash 5. Heart: Regular rate and rhythm no Murmur, Rub or gallop 6. Lungs:  Clear to auscultation bilaterally, no wheezes or crackles   7. Abdomen: Soft, non-tender, Non distended 8. Lower extremities: no clubbing, cyanosis, or edema 9. Neurologically Grossly intact, moving all 4 extremities equally 10. MSK: Normal range of motion  body mass index is unknown because there is no weight on file.   Labs on Admission:   Recent Labs  01/30/13 1852  NA 134*  K 3.4*  CL 94*  GLUCOSE 124*  BUN 14  CREATININE 1.00   No results found for this basename: AST, ALT, ALKPHOS, BILITOT, PROT, ALBUMIN,  in the last 72 hours No results found for this basename: LIPASE, AMYLASE,  in the last 72 hours  Recent Labs  01/30/13 1831 01/30/13 1852  WBC 8.3  --   NEUTROABS 5.9  --   HGB 13.5 13.6  HCT 38.6 40.0  MCV 92.1  --   PLT 197  --    No results found for this basename: CKTOTAL, CKMB, CKMBINDEX, TROPONINI,  in the last 72 hours No results found for this basename: TSH, T4TOTAL, FREET3, T3FREE, THYROIDAB,  in the last 72 hours No results found for this basename: VITAMINB12, FOLATE, FERRITIN, TIBC, IRON, RETICCTPCT,  in the last 72 hours  The CrCl is unknown because both a height and weight (above a minimum accepted value) are required for this calculation. ABG    Component Value Date/Time   PHART 7.502* 11/27/2007 1614   HCO3 27.0* 11/27/2007 1614   TCO2 33 01/30/2013 1852   O2SAT 92.1 11/27/2007 1614     No results found for this basename: DDIMER     Other results:  I have pearsonaly reviewed this: ECG REPORT  Rate: 75   Rhythm:  NSR ST&T Change: no ischemic changes   Cultures: No results found for this basename: sdes,  specrequest,  cult,  reptstatus       Radiological Exams on Admission: Dg Abd Acute W/chest  01/30/2013  *RADIOLOGY REPORT*  Clinical Data: Bloating.  Upper abdominal pain.  ACUTE ABDOMEN SERIES (ABDOMEN 2 VIEW & CHEST 1 VIEW)  Comparison: 02/27/2012  Findings: Bowel gas pattern does not show evidence of ileus, obstruction or free air.  There is a moderate amount of fecal matter in the colon.  There is been previous lumbar region discectomy and fusion.  One-view chest shows normal heart size.  There is unfolding of the aorta.  There is chronic scarring at the lung bases.  Volumes are somewhat diminished, presumed secondary to poor inspiration.  Some actual basilar atelectasis is not excluded.  IMPRESSION: Bowel gas pattern within normal limits.  Chronic increased markings at the bases.  This appears slightly more pronounced on the left, presumed secondary to a poor inspiration.  The possibility of some active atelectasis in the left lower lobe is not excluded.   Original Report Authenticated By: Paulina Fusi, M.D.     Chart has been reviewed  Assessment/Plan  78 year old female with history of coronary artery disease here with COPD exacerbation as well as severe abdominal pain most likely due to constipation but given the nature of her pain and diffuse spread as well as history of being a vasculopath will evaluate for potential bowel ischemia versus aortic dissection but somewhat less likely.  Present on Admission:  . COPD with acute exacerbation -  Will initiate prednisone taper, start on Levaquin, Albuterol PRN, scheduled Atrovent, and Mucinex. Titrate O2 to saturation >90%. Follow patients respiratory status.  Marland Kitchen HTN (hypertension) - currently stable continue home medications  . Abdominal  pain, other specified site - most likely secondary to constipation but the severe nature of her abdominal pain to  evaluate for ischemia to obtain lactic acid and CTA.  . Constipation - bowel preparation  . Hypokalemia -will replace    Prophylaxis:  Lovenox, Protonix  CODE STATUS: DNR/DNI  Other plan as per orders.  I have spent a total of 55 min on this admission  Marcelles Clinard 01/30/2013, 9:57 PM

## 2013-01-30 NOTE — ED Notes (Signed)
Pt arrived by [facility] for sob. Reports sob getting progressively worse today, hx of copd, on ems arrival spo2 91% and started on neb tx, still had wheezing noted and given 2nd neb tx and solumedrol 125mg  ivp pta. spo2 99% on neb tx.

## 2013-01-31 ENCOUNTER — Encounter (HOSPITAL_COMMUNITY): Payer: Self-pay | Admitting: General Practice

## 2013-01-31 LAB — URINALYSIS, ROUTINE W REFLEX MICROSCOPIC
Bilirubin Urine: NEGATIVE
Ketones, ur: NEGATIVE mg/dL
Nitrite: NEGATIVE
pH: 7 (ref 5.0–8.0)

## 2013-01-31 LAB — COMPREHENSIVE METABOLIC PANEL
AST: 15 U/L (ref 0–37)
Albumin: 3.4 g/dL — ABNORMAL LOW (ref 3.5–5.2)
Alkaline Phosphatase: 46 U/L (ref 39–117)
BUN: 12 mg/dL (ref 6–23)
CO2: 28 mEq/L (ref 19–32)
Chloride: 95 mEq/L — ABNORMAL LOW (ref 96–112)
GFR calc non Af Amer: 60 mL/min — ABNORMAL LOW (ref >90)
Potassium: 3.7 mEq/L (ref 3.5–5.1)
Total Bilirubin: 0.2 mg/dL — ABNORMAL LOW (ref 0.3–1.2)

## 2013-01-31 LAB — PHOSPHORUS: Phosphorus: 1.9 mg/dL — ABNORMAL LOW (ref 2.3–4.6)

## 2013-01-31 LAB — CBC
MCV: 92.4 fL (ref 78.0–100.0)
Platelets: 215 10*3/uL (ref 150–400)
RDW: 11.9 % (ref 11.5–15.5)
WBC: 9.8 10*3/uL (ref 4.0–10.5)

## 2013-01-31 LAB — TROPONIN I
Troponin I: <0.3 ng/mL (ref <0.30)
Troponin I: <0.3 ng/mL (ref <0.30)
Troponin I: <0.3 ng/mL (ref <0.30)

## 2013-01-31 MED ORDER — DOCUSATE SODIUM 100 MG PO CAPS
100.0000 mg | ORAL_CAPSULE | Freq: Two times a day (BID) | ORAL | Status: DC
  Administered 2013-01-31 – 2013-02-03 (×8): 100 mg via ORAL
  Filled 2013-01-31 (×9): qty 1

## 2013-01-31 MED ORDER — ALBUTEROL SULFATE (5 MG/ML) 0.5% IN NEBU
2.5000 mg | INHALATION_SOLUTION | RESPIRATORY_TRACT | Status: DC
  Administered 2013-01-31 (×2): 2.5 mg via RESPIRATORY_TRACT
  Filled 2013-01-31 (×2): qty 0.5

## 2013-01-31 MED ORDER — MORPHINE SULFATE 2 MG/ML IJ SOLN
2.0000 mg | INTRAMUSCULAR | Status: DC | PRN
  Administered 2013-01-31 – 2013-02-02 (×3): 2 mg via INTRAVENOUS
  Filled 2013-01-31 (×3): qty 1

## 2013-01-31 MED ORDER — ALBUTEROL SULFATE (5 MG/ML) 0.5% IN NEBU
2.5000 mg | INHALATION_SOLUTION | Freq: Four times a day (QID) | RESPIRATORY_TRACT | Status: DC
  Administered 2013-01-31 (×2): 2.5 mg via RESPIRATORY_TRACT
  Filled 2013-01-31 (×3): qty 0.5

## 2013-01-31 MED ORDER — ONDANSETRON HCL 4 MG PO TABS: 4.0000 mg | ORAL_TABLET | Freq: Four times a day (QID) | ORAL | Status: DC | PRN

## 2013-01-31 MED ORDER — POTASSIUM CHLORIDE CRYS ER 20 MEQ PO TBCR
20.0000 meq | EXTENDED_RELEASE_TABLET | Freq: Two times a day (BID) | ORAL | Status: AC
  Administered 2013-01-31 (×2): 20 meq via ORAL
  Filled 2013-01-31 (×2): qty 1

## 2013-01-31 MED ORDER — ALBUTEROL SULFATE (5 MG/ML) 0.5% IN NEBU: 2.5000 mg | INHALATION_SOLUTION | RESPIRATORY_TRACT | Status: DC | PRN

## 2013-01-31 MED ORDER — BISACODYL 10 MG RE SUPP: 10.0000 mg | Freq: Every day | RECTAL | Status: DC | PRN

## 2013-01-31 MED ORDER — PANTOPRAZOLE SODIUM 40 MG PO TBEC
40.0000 mg | DELAYED_RELEASE_TABLET | Freq: Every day | ORAL | Status: DC
  Administered 2013-01-31 – 2013-02-03 (×4): 40 mg via ORAL
  Filled 2013-01-31 (×4): qty 1

## 2013-01-31 MED ORDER — GUAIFENESIN ER 600 MG PO TB12
600.0000 mg | ORAL_TABLET | Freq: Two times a day (BID) | ORAL | Status: DC
  Administered 2013-01-31 – 2013-02-03 (×8): 600 mg via ORAL
  Filled 2013-01-31 (×9): qty 1

## 2013-01-31 MED ORDER — PROPRANOLOL HCL 40 MG PO TABS
40.0000 mg | ORAL_TABLET | Freq: Two times a day (BID) | ORAL | Status: DC
  Administered 2013-01-31 (×2): 40 mg via ORAL
  Filled 2013-01-31 (×3): qty 1

## 2013-01-31 MED ORDER — ENOXAPARIN SODIUM 40 MG/0.4ML ~~LOC~~ SOLN
40.0000 mg | SUBCUTANEOUS | Status: DC
  Administered 2013-01-31 – 2013-02-03 (×4): 40 mg via SUBCUTANEOUS
  Filled 2013-01-31 (×4): qty 0.4

## 2013-01-31 MED ORDER — METHYLPREDNISOLONE SODIUM SUCC 40 MG IJ SOLR
40.0000 mg | Freq: Four times a day (QID) | INTRAMUSCULAR | Status: DC
  Administered 2013-01-31 – 2013-02-03 (×12): 40 mg via INTRAVENOUS
  Filled 2013-01-31 (×17): qty 1

## 2013-01-31 MED ORDER — POTASSIUM CHLORIDE 10 MEQ/100ML IV SOLN
10.0000 meq | INTRAVENOUS | Status: AC
  Administered 2013-01-31: 10 meq via INTRAVENOUS
  Filled 2013-01-31 (×2): qty 100

## 2013-01-31 MED ORDER — ONDANSETRON HCL 4 MG/2ML IJ SOLN
4.0000 mg | Freq: Four times a day (QID) | INTRAMUSCULAR | Status: DC | PRN
  Administered 2013-01-31 – 2013-02-01 (×3): 4 mg via INTRAVENOUS
  Filled 2013-01-31 (×3): qty 2

## 2013-01-31 MED ORDER — ONDANSETRON HCL 4 MG/2ML IJ SOLN
4.0000 mg | Freq: Three times a day (TID) | INTRAMUSCULAR | Status: AC | PRN
  Filled 2013-01-31: qty 2

## 2013-01-31 MED ORDER — FAMOTIDINE 20 MG PO TABS
20.0000 mg | ORAL_TABLET | Freq: Every day | ORAL | Status: DC
  Administered 2013-01-31 – 2013-02-03 (×4): 20 mg via ORAL
  Filled 2013-01-31 (×4): qty 1

## 2013-01-31 MED ORDER — IPRATROPIUM BROMIDE 0.02 % IN SOLN
0.5000 mg | Freq: Four times a day (QID) | RESPIRATORY_TRACT | Status: DC
  Administered 2013-01-31 (×2): 0.5 mg via RESPIRATORY_TRACT
  Filled 2013-01-31 (×2): qty 2.5

## 2013-01-31 MED ORDER — SODIUM CHLORIDE 0.9 % IV SOLN
INTRAVENOUS | Status: DC
  Administered 2013-01-31: 02:00:00 via INTRAVENOUS

## 2013-01-31 MED ORDER — HYDROCODONE-ACETAMINOPHEN 10-325 MG PO TABS
1.0000 | ORAL_TABLET | ORAL | Status: DC | PRN
  Administered 2013-01-31 – 2013-02-03 (×12): 1 via ORAL
  Filled 2013-01-31 (×12): qty 1

## 2013-01-31 MED ORDER — ACETAMINOPHEN 650 MG RE SUPP: 650.0000 mg | Freq: Four times a day (QID) | RECTAL | Status: DC | PRN

## 2013-01-31 MED ORDER — POLYETHYLENE GLYCOL 3350 17 G PO PACK
17.0000 g | PACK | Freq: Two times a day (BID) | ORAL | Status: DC
  Administered 2013-01-31 – 2013-02-03 (×8): 17 g via ORAL
  Filled 2013-01-31 (×9): qty 1

## 2013-01-31 MED ORDER — BISACODYL 10 MG RE SUPP
10.0000 mg | Freq: Once | RECTAL | Status: AC
  Administered 2013-01-31: 10 mg via RECTAL
  Filled 2013-01-31: qty 1

## 2013-01-31 MED ORDER — TIOTROPIUM BROMIDE MONOHYDRATE 18 MCG IN CAPS
18.0000 ug | ORAL_CAPSULE | Freq: Every day | RESPIRATORY_TRACT | Status: DC
  Administered 2013-01-31 – 2013-02-03 (×4): 18 ug via RESPIRATORY_TRACT
  Filled 2013-01-31: qty 5

## 2013-01-31 MED ORDER — MAGNESIUM OXIDE 400 (241.3 MG) MG PO TABS
400.0000 mg | ORAL_TABLET | Freq: Two times a day (BID) | ORAL | Status: AC
  Administered 2013-01-31 – 2013-02-02 (×6): 400 mg via ORAL
  Filled 2013-01-31 (×6): qty 1

## 2013-01-31 MED ORDER — SODIUM CHLORIDE 0.9 % IJ SOLN
3.0000 mL | Freq: Two times a day (BID) | INTRAMUSCULAR | Status: DC
  Administered 2013-01-31 – 2013-02-03 (×7): 3 mL via INTRAVENOUS

## 2013-01-31 MED ORDER — ACETAMINOPHEN 325 MG PO TABS: 650.0000 mg | ORAL_TABLET | Freq: Four times a day (QID) | ORAL | Status: DC | PRN

## 2013-01-31 MED ORDER — CLOPIDOGREL BISULFATE 75 MG PO TABS
75.0000 mg | ORAL_TABLET | Freq: Every day | ORAL | Status: DC
  Administered 2013-01-31 – 2013-02-03 (×4): 75 mg via ORAL
  Filled 2013-01-31 (×4): qty 1

## 2013-01-31 MED ORDER — LEVOFLOXACIN 500 MG PO TABS
500.0000 mg | ORAL_TABLET | Freq: Every day | ORAL | Status: DC
  Administered 2013-01-31 – 2013-02-03 (×4): 500 mg via ORAL
  Filled 2013-01-31 (×4): qty 1

## 2013-01-31 MED ORDER — FLEET ENEMA 7-19 GM/118ML RE ENEM
1.0000 | ENEMA | Freq: Every day | RECTAL | Status: DC | PRN
  Administered 2013-01-31: 1 via RECTAL
  Filled 2013-01-31 (×2): qty 1

## 2013-01-31 MED ORDER — ISOSORBIDE MONONITRATE ER 30 MG PO TB24
30.0000 mg | ORAL_TABLET | Freq: Every day | ORAL | Status: DC
  Administered 2013-01-31 – 2013-02-03 (×4): 30 mg via ORAL
  Filled 2013-01-31 (×4): qty 1

## 2013-01-31 MED ORDER — METHYLPREDNISOLONE SODIUM SUCC 125 MG IJ SOLR
60.0000 mg | INTRAMUSCULAR | Status: DC
  Administered 2013-01-31: 60 mg via INTRAVENOUS
  Filled 2013-01-31 (×2): qty 0.96

## 2013-01-31 MED ORDER — IOHEXOL 350 MG/ML SOLN
100.0000 mL | Freq: Once | INTRAVENOUS | Status: AC | PRN
  Administered 2013-01-31: 100 mL via INTRAVENOUS

## 2013-01-31 NOTE — Progress Notes (Signed)
Utilization review completed.  

## 2013-01-31 NOTE — ED Notes (Signed)
Hemoccult was done per Hospitalist. Results were negative.

## 2013-01-31 NOTE — Progress Notes (Signed)
[facility] PROGRESS NOTE  [name] ZOX:[id number] DOB: June 28, 1935 DOA: 01/30/2013 PCP: [name], MD  Assessment/Plan: Active Problems:   HTN (hypertension)   COPD with acute exacerbation   Abdominal  pain, other specified site   Constipation   Hypokalemia    1. COPD with acute exacerbation/Chronic respiratory failure: Patient has known O2 dependent COPD, and presented with severe shortness of breath and wheezing, over her baseline. She also has had a cough, productive of yellow phlegm CXR is devoid of acte disease, as is Chest CTA. Managnig with parenteral steroids, bronchodilator nebulizers, and Levaquin, now day# 1, as well as Mucinex. Currently saturating at 93%-95% on 2L.  2. Abdominal pain/Constipation: Patient is troubled by chronic constipation, likely secondary to her opioid analgesics, and reportedly, her last bowel movement was on 01/28/13. She also presented with generalized abdominal pain/discomfort. Abdominal /pelvic CTA revealed no acute or concerning findings. Optimizing bowel regimen.  3. Chronic back pain: Patient has a history of lumbar DJD/chronic back pain, s/p surgery, with placement of hardware, and is chronically on opioid analgesics for this. Stable at this time.  4. HTN (hypertension): BP is controlled on pre-admission anti-hypertensives.  5. Hypokalemia: Repleted as indicated. magnesium is borderline at 1.5. Will replete.    Code Status: DNR/DNI Family Communication:  Disposition Plan: To be determined.    Brief narrative: 78 y.o. female with history of Hyperlipemia, HTN, GERD, CAD s/p PCI/Stent, O2-dependent COPD (On 2L/min), deafness, Arthritis, Lumbar DJD/Back pain, s/p surgery, with placement of hardware, presenting with a 1 day history of diffuse abdominal pain. She reports severe constipation and has not had a BM for the past 3 days. It seems she has hard time having a BM even when she tries to use a laxative. She had nausea, but no  vomiting. She takes chronic Vicodin for back pain. On 01/30/13, she also developed severe shortness of breath and wheezing. She is not on any inhalers at home. She has some chronic wheezing but on 01/30/13, it was worse than her baseline. Patient presented to ED and her wheezing did not resolved despite steroid IV and Nebulizers. Family denies any recent cough. Patient reports she's been having severe pain all away from her neck down into her anal region. She says the pain is so severe she she is feeling that she's going to die. Patient's family felt that she was clammy at home and called 911.     Consultants:  N/A.   Procedures:  CXR/Abdominal X-Ray.   Chest CTA.  CTA Abdomen/Pelvis.   Antibiotics:  Levaquin>>>  HPI/Subjective: Still has abdominal pain. No bowel movement yet.   Objective: Vital signs in last 24 hours: Temp:  [97.3 F (36.3 C)-97.4 F (36.3 C)] 97.3 F (36.3 C) (03/16 0100) Pulse Rate:  [64-87] 64 (03/16 0412) Resp:  [17-26] 18 (03/16 0412) BP: (116-173)/(45-134) 155/77 mmHg (03/16 0100) SpO2:  [93 %-100 %] 97 % (03/16 0100) Weight:  [81.466 kg (179 lb 9.6 oz)] 81.466 kg (179 lb 9.6 oz) (03/16 0100) Weight change:  Last BM Date: 01/28/13  Intake/Output from previous day: 03/15 0701 - 03/16 0700 In: 480 [P.O.:480] Out: 950 [Urine:950]     Physical Exam: General: Alert, hard of hearing, communicative, fully oriented, not short of breath at rest.  HEENT:  No clinical pallor, no jaundice, no conjunctival injection or discharge. Hydration is fair.  NECK:  Supple, JVP not seen, no carotid bruits, no palpable lymphadenopathy, no palpable goiter. CHEST:  Bilateral expiratory wheeze, no crackles. HEART:  Sounds 1 and 2 heard, normal, regular, no murmurs. ABDOMEN:  Moderately obese, softly distended, diffuse discomfort, no palpable organomegaly, no palpable masses, normal bowel sounds. GENITALIA:  Not examined. LOWER EXTREMITIES:  No pitting edema, palpable  peripheral pulses. MUSCULOSKELETAL SYSTEM:  Generalized osteoarthritic changes, otherwise, normal. CENTRAL NERVOUS SYSTEM:  No focal neurologic deficit on gross examination.  Lab Results:  Recent Labs  01/30/13 1831 01/30/13 1852 01/31/13 0450  WBC 8.3  --  9.8  HGB 13.5 13.6 14.4  HCT 38.6 40.0 41.1  PLT 197  --  215    Recent Labs  01/30/13 1852 01/31/13 0450  NA 134* 135  K 3.4* 3.7  CL 94* 95*  CO2  --  28  GLUCOSE 124* 184*  BUN 14 12  CREATININE 1.00 0.91  CALCIUM  --  9.6   No results found for this or any previous visit (from the past 240 hour(s)).   Studies/Results: Ct Angio Chest Pe W/cm &/or Wo Cm  01/31/2013  *RADIOLOGY REPORT*  Clinical Data:  Shortness of breath.  Chest and abdomen pain.  CT ANGIOGRAPHY CHEST, ABDOMEN AND PELVIS  Technique:  Multidetector CT imaging through the chest, abdomen and pelvis was performed using the standard protocol during bolus administration of intravenous contrast.  Multiplanar reconstructed images including MIPs were obtained and reviewed to evaluate the vascular anatomy.  Contrast: OMNIPAQUE IOHEXOL 350 MG/ML SOLN  Comparison:  CT abdomen and pelvis 01/27/2012  CTA CHEST  Findings:  Noncontrast CT images of the chest demonstrate normal caliber thoracic aorta with diffusely scattered aortic calcifications.  No evidence of intramural hematoma. Calcifications in the coronary arteries.  Calcified granuloma in the right upper lung.  Images obtained during arterial phase of contrast enhancement demonstrate normal heart size.  Normal caliber thoracic aorta.  No evidence of dissection.  Typical branching pattern of the great vessels.  No abnormal mediastinal fluid collections.  No significant mediastinal lymphadenopathy.  Esophagus is decompressed.  Visualized central and proximal segmental pulmonary arteries are patent without evidence of pulmonary embolus.  There is atelectasis in both lung bases.  Diffuse emphysematous change  throughout the lungs with scattered interstitial fibrosis. No focal consolidation or airspace disease.  No pneumothorax. Airways appear patent with peribronchial thickening suggesting chronic bronchitis.  Degenerative changes throughout the thoracic spine.   Review of the MIP images confirms the above findings.  IMPRESSION: No evidence of aortic dissection or aneurysm.  Atelectasis in both lung bases.  Diffuse emphysematous changes with scattered fibrosis in the lungs.  CTA ABDOMEN AND PELVIS  Findings:  Calcification of the aorta and iliac vessels as well as abdominal branch vessels.  Normal caliber abdominal aorta.  The abdominal aorta, celiac axis, superior mesenteric artery, single bilateral renal arteries, inferior mesenteric artery, and bilateral common iliac, external iliac, and common femoral arteries are patent.  No evidence of dissection or occlusion.  Renal nephrograms appear symmetrical.  The liver, spleen, gallbladder, pancreas, and retroperitoneal lymph nodes are unremarkable.  Mild prominence of the adrenal gland suggesting hyperplasia versus adenomas.  This is stable since previous study.  There is a right-sided lumbar hernia containing fat.  This may be postoperative, related to lumbar spine surgery. This is stable.  There is parenchymal atrophy in the kidneys with focal scarring in the left kidney.  Probable bilateral renal cysts. These are stable since previous study.  No pyelocaliectasis.  The stomach, small bowel, and colon are not abnormally distended. No free air or free fluid in the abdomen.  Small  umbilical hernia containing fat.  Pelvis:  The bladder wall is not thickened.  Uterus is surgically absent.  No abnormal adnexal masses.  Appendix is normal. Diverticula in the sigmoid colon without diverticulitis. Postoperative changes in the lumbar spine with posterior fixation and intervertebral disc fusion from L1-L5.  Mild retrolisthesis of L4 on L5.   Review of the MIP images confirms the  above findings.  IMPRESSION: No evidence of aortic dissection or occlusion.  No aortic aneurysm. Otherwise stable appearance of the abdomen and pelvis.  See multiple incidental findings above.   Original Report Authenticated By: Burman Nieves, M.D.    Dg Abd Acute W/chest  01/30/2013  *RADIOLOGY REPORT*  Clinical Data: Bloating.  Upper abdominal pain.  ACUTE ABDOMEN SERIES (ABDOMEN 2 VIEW & CHEST 1 VIEW)  Comparison: 02/27/2012  Findings: Bowel gas pattern does not show evidence of ileus, obstruction or free air.  There is a moderate amount of fecal matter in the colon.  There is been previous lumbar region discectomy and fusion.  One-view chest shows normal heart size.  There is unfolding of the aorta.  There is chronic scarring at the lung bases.  Volumes are somewhat diminished, presumed secondary to poor inspiration.  Some actual basilar atelectasis is not excluded.  IMPRESSION: Bowel gas pattern within normal limits.  Chronic increased markings at the bases.  This appears slightly more pronounced on the left, presumed secondary to a poor inspiration.  The possibility of some active atelectasis in the left lower lobe is not excluded.   Original Report Authenticated By: Paulina Fusi, M.D.    Ct Cta Abd/pel W/cm &/or W/o Cm  01/31/2013  *RADIOLOGY REPORT*  Clinical Data:  Shortness of breath.  Chest and abdomen pain.  CT ANGIOGRAPHY CHEST, ABDOMEN AND PELVIS  Technique:  Multidetector CT imaging through the chest, abdomen and pelvis was performed using the standard protocol during bolus administration of intravenous contrast.  Multiplanar reconstructed images including MIPs were obtained and reviewed to evaluate the vascular anatomy.  Contrast: OMNIPAQUE IOHEXOL 350 MG/ML SOLN  Comparison:  CT abdomen and pelvis 01/27/2012  CTA CHEST  Findings:  Noncontrast CT images of the chest demonstrate normal caliber thoracic aorta with diffusely scattered aortic calcifications.  No evidence of intramural  hematoma. Calcifications in the coronary arteries.  Calcified granuloma in the right upper lung.  Images obtained during arterial phase of contrast enhancement demonstrate normal heart size.  Normal caliber thoracic aorta.  No evidence of dissection.  Typical branching pattern of the great vessels.  No abnormal mediastinal fluid collections.  No significant mediastinal lymphadenopathy.  Esophagus is decompressed.  Visualized central and proximal segmental pulmonary arteries are patent without evidence of pulmonary embolus.  There is atelectasis in both lung bases.  Diffuse emphysematous change throughout the lungs with scattered interstitial fibrosis. No focal consolidation or airspace disease.  No pneumothorax. Airways appear patent with peribronchial thickening suggesting chronic bronchitis.  Degenerative changes throughout the thoracic spine.   Review of the MIP images confirms the above findings.  IMPRESSION: No evidence of aortic dissection or aneurysm.  Atelectasis in both lung bases.  Diffuse emphysematous changes with scattered fibrosis in the lungs.  CTA ABDOMEN AND PELVIS  Findings:  Calcification of the aorta and iliac vessels as well as abdominal branch vessels.  Normal caliber abdominal aorta.  The abdominal aorta, celiac axis, superior mesenteric artery, single bilateral renal arteries, inferior mesenteric artery, and bilateral common iliac, external iliac, and common femoral arteries are patent.  No evidence of  dissection or occlusion.  Renal nephrograms appear symmetrical.  The liver, spleen, gallbladder, pancreas, and retroperitoneal lymph nodes are unremarkable.  Mild prominence of the adrenal gland suggesting hyperplasia versus adenomas.  This is stable since previous study.  There is a right-sided lumbar hernia containing fat.  This may be postoperative, related to lumbar spine surgery. This is stable.  There is parenchymal atrophy in the kidneys with focal scarring in the left kidney.  Probable  bilateral renal cysts. These are stable since previous study.  No pyelocaliectasis.  The stomach, small bowel, and colon are not abnormally distended. No free air or free fluid in the abdomen.  Small umbilical hernia containing fat.  Pelvis:  The bladder wall is not thickened.  Uterus is surgically absent.  No abnormal adnexal masses.  Appendix is normal. Diverticula in the sigmoid colon without diverticulitis. Postoperative changes in the lumbar spine with posterior fixation and intervertebral disc fusion from L1-L5.  Mild retrolisthesis of L4 on L5.   Review of the MIP images confirms the above findings.  IMPRESSION: No evidence of aortic dissection or occlusion.  No aortic aneurysm. Otherwise stable appearance of the abdomen and pelvis.  See multiple incidental findings above.   Original Report Authenticated By: Burman Nieves, M.D.     Medications: Scheduled Meds: . sodium chloride   Intravenous STAT  . albuterol  2.5 mg Nebulization Q4H  . clopidogrel  75 mg Oral Daily  . docusate sodium  100 mg Oral BID  . enoxaparin (LOVENOX) injection  40 mg Subcutaneous Q24H  . famotidine  20 mg Oral Daily  . guaiFENesin  600 mg Oral BID  . ipratropium  0.5 mg Nebulization Q6H  . isosorbide mononitrate  30 mg Oral Daily  . levofloxacin  500 mg Oral Daily  . methylPREDNISolone (SOLU-MEDROL) injection  60 mg Intravenous Q24H  . pantoprazole  40 mg Oral Q1200  . polyethylene glycol  17 g Oral BID  . potassium chloride  20 mEq Oral BID  . propranolol  40 mg Oral BID  . sodium chloride  3 mL Intravenous Q12H   Continuous Infusions:  PRN Meds:.acetaminophen, acetaminophen, albuterol, bisacodyl, HYDROcodone-acetaminophen, morphine injection, ondansetron (ZOFRAN) IV, ondansetron (ZOFRAN) IV, ondansetron, sodium phosphate    LOS: 1 day   Robert Sunga,[name]  [facility] Pager [phone]. If 8PM-8AM, please contact night-coverage at [email or web address], password [facility] 01/31/2013, 7:50 AM  LOS: 1 day

## 2013-01-31 NOTE — ED Notes (Signed)
Pt. Back from CT scan. Spoke to [place] on 4700 updated re: events since last report. Pt. Escorted to floor via stretcher.

## 2013-01-31 NOTE — Progress Notes (Signed)
Admitted pt to rm 4735 from ED via stretcher, oriented to room, callbell placed within reach, admission assessment done, orders carried out. Will continue to monitor. Filed Vitals:   01/31/13 0100  BP: 155/77  Pulse: 87  Temp: 97.3 F (36.3 C)  Resp: 20  Tifini Reeder, [address]

## 2013-02-01 LAB — BASIC METABOLIC PANEL
BUN: 21 mg/dL (ref 6–23)
Chloride: 97 mEq/L (ref 96–112)
GFR calc Af Amer: 71 mL/min — ABNORMAL LOW (ref >90)
Potassium: 4.7 mEq/L (ref 3.5–5.1)

## 2013-02-01 LAB — CBC
HCT: 38.9 % (ref 36.0–46.0)
Platelets: 207 10*3/uL (ref 150–400)
RDW: 12.3 % (ref 11.5–15.5)
WBC: 18.2 10*3/uL — ABNORMAL HIGH (ref 4.0–10.5)

## 2013-02-01 LAB — URINE CULTURE

## 2013-02-01 LAB — MAGNESIUM: Magnesium: 2.2 mg/dL (ref 1.5–2.5)

## 2013-02-01 MED ORDER — PREDNISONE 20 MG PO TABS
40.0000 mg | ORAL_TABLET | Freq: Every day | ORAL | Status: DC
  Administered 2013-02-02 – 2013-02-03 (×2): 40 mg via ORAL
  Filled 2013-02-01 (×4): qty 2

## 2013-02-01 MED ORDER — BISACODYL 10 MG RE SUPP
10.0000 mg | Freq: Once | RECTAL | Status: AC
  Administered 2013-02-01: 10 mg via RECTAL
  Filled 2013-02-01: qty 1

## 2013-02-01 MED ORDER — ALBUTEROL SULFATE (5 MG/ML) 0.5% IN NEBU
2.5000 mg | INHALATION_SOLUTION | Freq: Three times a day (TID) | RESPIRATORY_TRACT | Status: DC
  Administered 2013-02-01 – 2013-02-03 (×7): 2.5 mg via RESPIRATORY_TRACT
  Filled 2013-02-01 (×8): qty 0.5

## 2013-02-01 MED ORDER — PROMETHAZINE HCL 25 MG/ML IJ SOLN
12.5000 mg | Freq: Once | INTRAMUSCULAR | Status: AC
  Administered 2013-02-01: 12.5 mg via INTRAVENOUS
  Filled 2013-02-01: qty 1

## 2013-02-01 NOTE — Progress Notes (Signed)
The patient was complaining of nausea after receiving Zofran two hours earlier.  The [facility] hospitalist was notified.  New orders were given for 12.5 mg of IV phenergan.  The RN will carry out the orders and will continue to monitor the patient.

## 2013-02-01 NOTE — Progress Notes (Signed)
[facility] PROGRESS NOTE  [name] VHQ:[id number] DOB: Jan 21, 1935 DOA: 01/30/2013 PCP: [name], MD  Assessment/Plan: Active Problems:   HTN (hypertension)   COPD with acute exacerbation   Abdominal  pain, other specified site   Constipation   Hypokalemia    1. COPD with acute exacerbation/Chronic respiratory failure: Patient has known O2 dependent COPD, and presented with severe shortness of breath and wheezing, over her baseline. She also has had a cough, productive of yellow phlegm CXR is devoid of acte disease, as is Chest CTA. Managnig with parenteral steroids, bronchodilator nebulizers, and Levaquin, now day# 2, as well as Mucinex. Currently saturating at 93%-95% on 2L.Patient feels considerably better today, and is much less wheezy. Will start oral steroid taper on 02/02/13.  2. Abdominal pain/Constipation: Patient is troubled by chronic constipation, likely secondary to her opioid analgesics, and reportedly, her last bowel movement was on 01/28/13. She also presented with generalized abdominal pain/discomfort. Abdominal /pelvic CTA revealed no acute or concerning findings. Optimizing bowel regimen. Abdomen is more comfortable today, and patient passed a small amount of stool, yesterday.  3. Chronic back pain: Patient has a history of lumbar DJD/chronic back pain, s/p surgery, with placement of hardware, and is chronically on opioid analgesics for this. Stable at this time.  4. HTN (hypertension): BP is controlled on pre-admission anti-hypertensives.  5. Hypokalemia: Repleted as indicated. Magnesium was borderline at 1.5 on 01/31/13. Repleted.    Code Status: DNR/DNI Family Communication:  Disposition Plan: To be determined.    Brief narrative: 78 y.o. female with history of Hyperlipemia, HTN, GERD, CAD s/p PCI/Stent, O2-dependent COPD (On 2L/min), deafness, Arthritis, Lumbar DJD/Back pain, s/p surgery, with placement of hardware, presenting with a 1 day  history of diffuse abdominal pain. She reports severe constipation and has not had a BM for the past 3 days. It seems she has hard time having a BM even when she tries to use a laxative. She had nausea, but no vomiting. She takes chronic Vicodin for back pain. On 01/30/13, she also developed severe shortness of breath and wheezing. She is not on any inhalers at home. She has some chronic wheezing but on 01/30/13, it was worse than her baseline. Patient presented to ED and her wheezing did not resolved despite steroid IV and Nebulizers. Family denies any recent cough. Patient reports she's been having severe pain all away from her neck down into her anal region. She says the pain is so severe she she is feeling that she's going to die. Patient's family felt that she was clammy at home and called 911.     Consultants:  N/A.   Procedures:  CXR/Abdominal X-Ray.   Chest CTA.  CTA Abdomen/Pelvis.   Antibiotics:  Levaquin 01/31/13>>>  HPI/Subjective: Feels better today.   Objective: Vital signs in last 24 hours: Temp:  [97.2 F (36.2 C)-97.6 F (36.4 C)] 97.4 F (36.3 C) (03/17 1347) Pulse Rate:  [67-78] 78 (03/17 1347) Resp:  [18-20] 20 (03/17 1347) BP: (124-154)/(55-63) 154/63 mmHg (03/17 1347) SpO2:  [91 %-94 %] 94 % (03/17 1347) Weight:  [81.5 kg (179 lb 10.8 oz)] 81.5 kg (179 lb 10.8 oz) (03/17 0548) Weight change: 0.034 kg (1.2 oz) Last BM Date: 01/31/13  Intake/Output from previous day: 03/16 0701 - 03/17 0700 In: 703 [P.O.:700; I.V.:3] Out: 1550 [Urine:1550] Total I/O In: 960 [P.O.:960] Out: 700 [Urine:700]   Physical Exam: General: Alert, hard of hearing, communicative, fully oriented, not short of breath at rest.  HEENT:  No  clinical pallor, no jaundice, no conjunctival injection or discharge. Hydration is fair.  NECK:  Supple, JVP not seen, no carotid bruits, no palpable lymphadenopathy, no palpable goiter. CHEST:  Only few wheezes, no crackles. HEART:  Sounds 1 and  2 heard, normal, regular, no murmurs. ABDOMEN:  Moderately obese, soft, non-tender. No palpable organomegaly, no palpable masses, normal bowel sounds. GENITALIA:  Not examined. LOWER EXTREMITIES:  No pitting edema, palpable peripheral pulses. MUSCULOSKELETAL SYSTEM:  Generalized osteoarthritic changes, otherwise, normal. CENTRAL NERVOUS SYSTEM:  No focal neurologic deficit on gross examination.  Lab Results:  Recent Labs  01/31/13 0450 02/01/13 0630  WBC 9.8 18.2*  HGB 14.4 13.6  HCT 41.1 38.9  PLT 215 207    Recent Labs  01/31/13 0450 02/01/13 0630  NA 135 134*  K 3.7 4.7  CL 95* 97  CO2 28 28  GLUCOSE 184* 149*  BUN 12 21  CREATININE 0.91 0.89  CALCIUM 9.6 9.3   Recent Results (from the past 240 hour(s))  URINE CULTURE     Status: None   Collection Time    01/31/13  1:43 AM      Result Value Range Status   Specimen Description URINE, CLEAN CATCH   Final   Special Requests Normal   Final   Culture  Setup Time 01/31/2013 14:57   Final   Colony Count NO GROWTH   Final   Culture NO GROWTH   Final   Report Status 02/01/2013 FINAL   Final     Studies/Results: Ct Angio Chest Pe W/cm &/or Wo Cm  01/31/2013  *RADIOLOGY REPORT*  Clinical Data:  Shortness of breath.  Chest and abdomen pain.  CT ANGIOGRAPHY CHEST, ABDOMEN AND PELVIS  Technique:  Multidetector CT imaging through the chest, abdomen and pelvis was performed using the standard protocol during bolus administration of intravenous contrast.  Multiplanar reconstructed images including MIPs were obtained and reviewed to evaluate the vascular anatomy.  Contrast: OMNIPAQUE IOHEXOL 350 MG/ML SOLN  Comparison:  CT abdomen and pelvis 01/27/2012  CTA CHEST  Findings:  Noncontrast CT images of the chest demonstrate normal caliber thoracic aorta with diffusely scattered aortic calcifications.  No evidence of intramural hematoma. Calcifications in the coronary arteries.  Calcified granuloma in the right upper lung.  Images  obtained during arterial phase of contrast enhancement demonstrate normal heart size.  Normal caliber thoracic aorta.  No evidence of dissection.  Typical branching pattern of the great vessels.  No abnormal mediastinal fluid collections.  No significant mediastinal lymphadenopathy.  Esophagus is decompressed.  Visualized central and proximal segmental pulmonary arteries are patent without evidence of pulmonary embolus.  There is atelectasis in both lung bases.  Diffuse emphysematous change throughout the lungs with scattered interstitial fibrosis. No focal consolidation or airspace disease.  No pneumothorax. Airways appear patent with peribronchial thickening suggesting chronic bronchitis.  Degenerative changes throughout the thoracic spine.   Review of the MIP images confirms the above findings.  IMPRESSION: No evidence of aortic dissection or aneurysm.  Atelectasis in both lung bases.  Diffuse emphysematous changes with scattered fibrosis in the lungs.  CTA ABDOMEN AND PELVIS  Findings:  Calcification of the aorta and iliac vessels as well as abdominal branch vessels.  Normal caliber abdominal aorta.  The abdominal aorta, celiac axis, superior mesenteric artery, single bilateral renal arteries, inferior mesenteric artery, and bilateral common iliac, external iliac, and common femoral arteries are patent.  No evidence of dissection or occlusion.  Renal nephrograms appear symmetrical.  The liver,  spleen, gallbladder, pancreas, and retroperitoneal lymph nodes are unremarkable.  Mild prominence of the adrenal gland suggesting hyperplasia versus adenomas.  This is stable since previous study.  There is a right-sided lumbar hernia containing fat.  This may be postoperative, related to lumbar spine surgery. This is stable.  There is parenchymal atrophy in the kidneys with focal scarring in the left kidney.  Probable bilateral renal cysts. These are stable since previous study.  No pyelocaliectasis.  The stomach, small  bowel, and colon are not abnormally distended. No free air or free fluid in the abdomen.  Small umbilical hernia containing fat.  Pelvis:  The bladder wall is not thickened.  Uterus is surgically absent.  No abnormal adnexal masses.  Appendix is normal. Diverticula in the sigmoid colon without diverticulitis. Postoperative changes in the lumbar spine with posterior fixation and intervertebral disc fusion from L1-L5.  Mild retrolisthesis of L4 on L5.   Review of the MIP images confirms the above findings.  IMPRESSION: No evidence of aortic dissection or occlusion.  No aortic aneurysm. Otherwise stable appearance of the abdomen and pelvis.  See multiple incidental findings above.   Original Report Authenticated By: Burman Nieves, M.D.    Dg Abd Acute W/chest  01/30/2013  *RADIOLOGY REPORT*  Clinical Data: Bloating.  Upper abdominal pain.  ACUTE ABDOMEN SERIES (ABDOMEN 2 VIEW & CHEST 1 VIEW)  Comparison: 02/27/2012  Findings: Bowel gas pattern does not show evidence of ileus, obstruction or free air.  There is a moderate amount of fecal matter in the colon.  There is been previous lumbar region discectomy and fusion.  One-view chest shows normal heart size.  There is unfolding of the aorta.  There is chronic scarring at the lung bases.  Volumes are somewhat diminished, presumed secondary to poor inspiration.  Some actual basilar atelectasis is not excluded.  IMPRESSION: Bowel gas pattern within normal limits.  Chronic increased markings at the bases.  This appears slightly more pronounced on the left, presumed secondary to a poor inspiration.  The possibility of some active atelectasis in the left lower lobe is not excluded.   Original Report Authenticated By: Paulina Fusi, M.D.    Ct Cta Abd/pel W/cm &/or W/o Cm  01/31/2013  *RADIOLOGY REPORT*  Clinical Data:  Shortness of breath.  Chest and abdomen pain.  CT ANGIOGRAPHY CHEST, ABDOMEN AND PELVIS  Technique:  Multidetector CT imaging through the chest, abdomen  and pelvis was performed using the standard protocol during bolus administration of intravenous contrast.  Multiplanar reconstructed images including MIPs were obtained and reviewed to evaluate the vascular anatomy.  Contrast: OMNIPAQUE IOHEXOL 350 MG/ML SOLN  Comparison:  CT abdomen and pelvis 01/27/2012  CTA CHEST  Findings:  Noncontrast CT images of the chest demonstrate normal caliber thoracic aorta with diffusely scattered aortic calcifications.  No evidence of intramural hematoma. Calcifications in the coronary arteries.  Calcified granuloma in the right upper lung.  Images obtained during arterial phase of contrast enhancement demonstrate normal heart size.  Normal caliber thoracic aorta.  No evidence of dissection.  Typical branching pattern of the great vessels.  No abnormal mediastinal fluid collections.  No significant mediastinal lymphadenopathy.  Esophagus is decompressed.  Visualized central and proximal segmental pulmonary arteries are patent without evidence of pulmonary embolus.  There is atelectasis in both lung bases.  Diffuse emphysematous change throughout the lungs with scattered interstitial fibrosis. No focal consolidation or airspace disease.  No pneumothorax. Airways appear patent with peribronchial thickening suggesting chronic bronchitis.  Degenerative changes throughout the thoracic spine.   Review of the MIP images confirms the above findings.  IMPRESSION: No evidence of aortic dissection or aneurysm.  Atelectasis in both lung bases.  Diffuse emphysematous changes with scattered fibrosis in the lungs.  CTA ABDOMEN AND PELVIS  Findings:  Calcification of the aorta and iliac vessels as well as abdominal branch vessels.  Normal caliber abdominal aorta.  The abdominal aorta, celiac axis, superior mesenteric artery, single bilateral renal arteries, inferior mesenteric artery, and bilateral common iliac, external iliac, and common femoral arteries are patent.  No evidence of dissection  or occlusion.  Renal nephrograms appear symmetrical.  The liver, spleen, gallbladder, pancreas, and retroperitoneal lymph nodes are unremarkable.  Mild prominence of the adrenal gland suggesting hyperplasia versus adenomas.  This is stable since previous study.  There is a right-sided lumbar hernia containing fat.  This may be postoperative, related to lumbar spine surgery. This is stable.  There is parenchymal atrophy in the kidneys with focal scarring in the left kidney.  Probable bilateral renal cysts. These are stable since previous study.  No pyelocaliectasis.  The stomach, small bowel, and colon are not abnormally distended. No free air or free fluid in the abdomen.  Small umbilical hernia containing fat.  Pelvis:  The bladder wall is not thickened.  Uterus is surgically absent.  No abnormal adnexal masses.  Appendix is normal. Diverticula in the sigmoid colon without diverticulitis. Postoperative changes in the lumbar spine with posterior fixation and intervertebral disc fusion from L1-L5.  Mild retrolisthesis of L4 on L5.   Review of the MIP images confirms the above findings.  IMPRESSION: No evidence of aortic dissection or occlusion.  No aortic aneurysm. Otherwise stable appearance of the abdomen and pelvis.  See multiple incidental findings above.   Original Report Authenticated By: Burman Nieves, M.D.     Medications: Scheduled Meds: . albuterol  2.5 mg Nebulization TID  . clopidogrel  75 mg Oral Daily  . docusate sodium  100 mg Oral BID  . enoxaparin (LOVENOX) injection  40 mg Subcutaneous Q24H  . famotidine  20 mg Oral Daily  . guaiFENesin  600 mg Oral BID  . isosorbide mononitrate  30 mg Oral Daily  . levofloxacin  500 mg Oral Daily  . magnesium oxide  400 mg Oral BID  . methylPREDNISolone (SOLU-MEDROL) injection  40 mg Intravenous Q6H  . pantoprazole  40 mg Oral Q1200  . polyethylene glycol  17 g Oral BID  . sodium chloride  3 mL Intravenous Q12H  . tiotropium  18 mcg Inhalation  Daily   Continuous Infusions:  PRN Meds:.acetaminophen, acetaminophen, albuterol, HYDROcodone-acetaminophen, morphine injection, ondansetron (ZOFRAN) IV, ondansetron, sodium phosphate    LOS: 2 days   Safi Culotta,CHRISTOPHER  [facility] Pager [phone]. If 8PM-8AM, please contact night-coverage at [email or web address], password [facility] 02/01/2013, 4:47 PM  LOS: 2 days

## 2013-02-02 LAB — BASIC METABOLIC PANEL WITH GFR
BUN: 28 mg/dL — ABNORMAL HIGH (ref 6–23)
CO2: 27 meq/L (ref 19–32)
Calcium: 9.1 mg/dL (ref 8.4–10.5)
Chloride: 97 meq/L (ref 96–112)
Creatinine, Ser: 0.96 mg/dL (ref 0.50–1.10)
GFR calc Af Amer: 65 mL/min — ABNORMAL LOW
GFR calc non Af Amer: 56 mL/min — ABNORMAL LOW
Glucose, Bld: 160 mg/dL — ABNORMAL HIGH (ref 70–99)
Potassium: 4.8 meq/L (ref 3.5–5.1)
Sodium: 134 meq/L — ABNORMAL LOW (ref 135–145)

## 2013-02-02 LAB — CBC
HCT: 41.2 % (ref 36.0–46.0)
Hemoglobin: 14.2 g/dL (ref 12.0–15.0)
MCH: 32.6 pg (ref 26.0–34.0)
MCHC: 34.5 g/dL (ref 30.0–36.0)
MCV: 94.5 fL (ref 78.0–100.0)
Platelets: 211 K/uL (ref 150–400)
RBC: 4.36 MIL/uL (ref 3.87–5.11)
RDW: 12.3 % (ref 11.5–15.5)
WBC: 15 K/uL — ABNORMAL HIGH (ref 4.0–10.5)

## 2013-02-02 MED ORDER — BISACODYL 10 MG RE SUPP
10.0000 mg | Freq: Once | RECTAL | Status: AC
  Administered 2013-02-02: 10 mg via RECTAL
  Filled 2013-02-02: qty 1

## 2013-02-02 NOTE — Progress Notes (Signed)
[facility] PROGRESS NOTE  [name] AVW:[id number] DOB: Oct 23, 1935 DOA: 01/30/2013 PCP: [name], MD  Assessment/Plan: Active Problems:   HTN (hypertension)   COPD with acute exacerbation   Abdominal  pain, other specified site   Constipation   Hypokalemia    1. COPD with acute exacerbation/Chronic respiratory failure: Patient has known O2 dependent COPD, and presented with severe shortness of breath and wheezing, over her baseline. She also has had a cough, productive of yellow phlegm CXR is devoid of acte disease, as is Chest CTA. Managnig with parenteral steroids, bronchodilator nebulizers, and Levaquin, now day# 3, as well as Mucinex. Currently saturating at 93%-95% on 2L. Clinically, patient has shown considerable improvement and is devoid of wheeze today. Oral steroid taper was commenced on 02/02/13.  2. Abdominal pain/Constipation: Patient is troubled by chronic constipation, likely secondary to her opioid analgesics, and reportedly, her last bowel movement was on 01/28/13. She also presented with generalized abdominal pain/discomfort. Abdominal /pelvic CTA revealed no acute or concerning findings. Optimizing bowel regimen. Abdominal discomfort has improved, and patient is passing small amounts of stool.  3. Chronic back pain: Patient has a history of lumbar DJD/chronic back pain, s/p surgery, with placement of hardware, and is chronically on opioid analgesics for this. Stable at this time.  4. HTN (hypertension): BP is controlled on pre-admission anti-hypertensives.  5. Hypokalemia: Repleted as indicated. Magnesium was borderline at 1.5 on 01/31/13. Repleted.    Code Status: DNR/DNI Family Communication:  Disposition Plan: To be determined.    Brief narrative: 78 y.o. female with history of Hyperlipemia, HTN, GERD, CAD s/p PCI/Stent, O2-dependent COPD (On 2L/min), deafness, Arthritis, Lumbar DJD/Back pain, s/p surgery, with placement of hardware, presenting  with a 1 day history of diffuse abdominal pain. She reports severe constipation and has not had a BM for the past 3 days. It seems she has hard time having a BM even when she tries to use a laxative. She had nausea, but no vomiting. She takes chronic Vicodin for back pain. On 01/30/13, she also developed severe shortness of breath and wheezing. She is not on any inhalers at home. She has some chronic wheezing but on 01/30/13, it was worse than her baseline. Patient presented to ED and her wheezing did not resolved despite steroid IV and Nebulizers. Family denies any recent cough. Patient reports she's been having severe pain all away from her neck down into her anal region. She says the pain is so severe she she is feeling that she's going to die. Patient's family felt that she was clammy at home and called 911.     Consultants:  N/A.   Procedures:  CXR/Abdominal X-Ray.   Chest CTA.  CTA Abdomen/Pelvis.   Antibiotics:  Levaquin 01/31/13>>>  HPI/Subjective: Much better.   Objective: Vital signs in last 24 hours: Temp:  [97.4 F (36.3 C)-98.1 F (36.7 C)] 97.5 F (36.4 C) (03/18 0940) Pulse Rate:  [76-92] 88 (03/18 0940) Resp:  [18-20] 18 (03/18 0940) BP: (144-174)/(57-98) 144/57 mmHg (03/18 0940) SpO2:  [93 %-97 %] 97 % (03/18 0940) Weight:  [81.9 kg (180 lb 8.9 oz)] 81.9 kg (180 lb 8.9 oz) (03/18 0559) Weight change: 0.4 kg (14.1 oz) Last BM Date: 01/31/13  Intake/Output from previous day: 03/17 0701 - 03/18 0700 In: 1443 [P.O.:1440; I.V.:3] Out: 1950 [Urine:1950] Total I/O In: 360 [P.O.:360] Out: 550 [Urine:550]   Physical Exam: General: Alert, hard of hearing, communicative, fully oriented, not short of breath at rest.  HEENT:  No  clinical pallor, no jaundice, no conjunctival injection or discharge. Hydration is fair.  NECK:  Supple, JVP not seen, no carotid bruits, no palpable lymphadenopathy, no palpable goiter. CHEST:  Clinically clear today. HEART:  Sounds 1 and 2  heard, normal, regular, no murmurs. ABDOMEN:  Moderately obese, soft, non-tender. No palpable organomegaly, no palpable masses, normal bowel sounds. GENITALIA:  Not examined. LOWER EXTREMITIES:  No pitting edema, palpable peripheral pulses. MUSCULOSKELETAL SYSTEM:  Generalized osteoarthritic changes, otherwise, normal. CENTRAL NERVOUS SYSTEM:  No focal neurologic deficit on gross examination.  Lab Results:  Recent Labs  02/01/13 0630 02/02/13 0612  WBC 18.2* 15.0*  HGB 13.6 14.2  HCT 38.9 41.2  PLT 207 211    Recent Labs  02/01/13 0630 02/02/13 0612  NA 134* 134*  K 4.7 4.8  CL 97 97  CO2 28 27  GLUCOSE 149* 160*  BUN 21 28*  CREATININE 0.89 0.96  CALCIUM 9.3 9.1   Recent Results (from the past 240 hour(s))  URINE CULTURE     Status: None   Collection Time    01/31/13  1:43 AM      Result Value Range Status   Specimen Description URINE, CLEAN CATCH   Final   Special Requests Normal   Final   Culture  Setup Time 01/31/2013 14:57   Final   Colony Count NO GROWTH   Final   Culture NO GROWTH   Final   Report Status 02/01/2013 FINAL   Final     Studies/Results: No results found.  Medications: Scheduled Meds: . albuterol  2.5 mg Nebulization TID  . clopidogrel  75 mg Oral Daily  . docusate sodium  100 mg Oral BID  . enoxaparin (LOVENOX) injection  40 mg Subcutaneous Q24H  . famotidine  20 mg Oral Daily  . guaiFENesin  600 mg Oral BID  . isosorbide mononitrate  30 mg Oral Daily  . levofloxacin  500 mg Oral Daily  . magnesium oxide  400 mg Oral BID  . methylPREDNISolone (SOLU-MEDROL) injection  40 mg Intravenous Q6H  . pantoprazole  40 mg Oral Q1200  . polyethylene glycol  17 g Oral BID  . predniSONE  40 mg Oral Q breakfast  . sodium chloride  3 mL Intravenous Q12H  . tiotropium  18 mcg Inhalation Daily   Continuous Infusions:  PRN Meds:.acetaminophen, acetaminophen, albuterol, HYDROcodone-acetaminophen, morphine injection, ondansetron (ZOFRAN) IV,  ondansetron, sodium phosphate    LOS: 3 days   [name],CHRISTOPHER  [facility] Pager [phone]. If 8PM-8AM, please contact night-coverage at [email or web address], password [facility] 02/02/2013, 12:38 PM  LOS: 3 days

## 2013-02-03 LAB — BASIC METABOLIC PANEL
CO2: 24 mEq/L (ref 19–32)
Calcium: 9.1 mg/dL (ref 8.4–10.5)
Chloride: 94 mEq/L — ABNORMAL LOW (ref 96–112)
Creatinine, Ser: 0.94 mg/dL (ref 0.50–1.10)
Glucose, Bld: 158 mg/dL — ABNORMAL HIGH (ref 70–99)
Sodium: 132 mEq/L — ABNORMAL LOW (ref 135–145)

## 2013-02-03 LAB — CBC
Hemoglobin: 14.7 g/dL (ref 12.0–15.0)
MCH: 32.8 pg (ref 26.0–34.0)
MCV: 93.1 fL (ref 78.0–100.0)
RBC: 4.48 MIL/uL (ref 3.87–5.11)

## 2013-02-03 MED ORDER — PROPRANOLOL HCL 40 MG PO TABS
40.0000 mg | ORAL_TABLET | Freq: Two times a day (BID) | ORAL | Status: DC
  Administered 2013-02-03: 40 mg via ORAL
  Filled 2013-02-03 (×2): qty 1

## 2013-02-03 MED ORDER — TIOTROPIUM BROMIDE MONOHYDRATE 18 MCG IN CAPS: 18.0000 ug | ORAL_CAPSULE | Freq: Every day | RESPIRATORY_TRACT | Status: DC

## 2013-02-03 MED ORDER — ALPRAZOLAM 0.5 MG PO TABS
0.5000 mg | ORAL_TABLET | Freq: Two times a day (BID) | ORAL | Status: DC | PRN
  Administered 2013-02-03: 0.5 mg via ORAL
  Filled 2013-02-03: qty 1

## 2013-02-03 MED ORDER — ALBUTEROL SULFATE HFA 108 (90 BASE) MCG/ACT IN AERS: 2.0000 | INHALATION_SPRAY | RESPIRATORY_TRACT | Status: DC | PRN

## 2013-02-03 MED ORDER — PREDNISONE 10 MG PO TABS: ORAL_TABLET | ORAL | Status: DC

## 2013-02-03 MED ORDER — GUAIFENESIN ER 600 MG PO TB12: 600.0000 mg | ORAL_TABLET | Freq: Two times a day (BID) | ORAL | Status: DC

## 2013-02-03 MED ORDER — LEVOFLOXACIN 500 MG PO TABS: 500.0000 mg | ORAL_TABLET | Freq: Every day | ORAL | Status: DC

## 2013-02-03 NOTE — Progress Notes (Signed)
Utilization Review Completed.   Ymani Porcher, RN, BSN Nurse Case Manager  [phone]  

## 2013-02-03 NOTE — Evaluation (Signed)
Physical Therapy Evaluation Patient Details Name: [name] MRN: [id number] DOB: [birth year] Today's Date: 02/03/2013 Time: 9562-1308 PT Time Calculation (min): 37 min  PT Assessment / Plan / Recommendation Clinical Impression  78 y/o female adm. for sob in setting of COPD. Presents to PT with decreased activity tolerance, generalize weakness and balance impairments affecting PLOF. Will benefit physical therapy in the acute setting to maximize functional independence for safe d/c home. Rec patient use RW on d/c and f/u with HHPT.     PT Assessment  Patient needs continued PT services    Follow Up Recommendations  Home health PT;Supervision - Intermittent    Does the patient have the potential to tolerate intense rehabilitation      Barriers to Discharge Decreased caregiver support sister also has difficulties, daughter can come help get her in the house but lives in [facility]    Equipment Recommendations  None recommended by PT    Recommendations for Other Services     Frequency Min 3X/week    Precautions / Restrictions Precautions Precautions: Fall Restrictions Weight Bearing Restrictions: No   Pertinent Vitals/Pain Denies pain      Mobility  Bed Mobility Bed Mobility: Not assessed Details for Bed Mobility Assistance: pt sitting EOB on my arrival Transfers Transfers: Sit to Stand;Stand to Sit Sit to Stand: 5: Supervision;With upper extremity assist;From bed Stand to Sit: 5: Supervision;With upper extremity assist;To bed Details for Transfer Assistance: cues for safe hand  placement, without RW in front of patient she reaches for upper extremity support, with RW she is able to stabilize herself Ambulation/Gait Ambulation/Gait Assistance: 4: Min guard;4: Architect (Feet): 250 Feet Assistive device: Rolling walker;None Ambulation/Gait Assistance Details: ambulated the first 30 ft without and AD but needed minA for stabiliy and pt reaching for  upper extremity support; with RW pt more steady needing gaurding for safety and cues for safe technique with RW Gait Pattern: Step-through pattern Gait velocity: decreased         PT Diagnosis: Difficulty walking;Abnormality of gait;Generalized weakness  PT Problem List: Decreased strength;Decreased activity tolerance;Decreased balance;Decreased mobility;Decreased knowledge of use of DME PT Treatment Interventions: DME instruction;Gait training;Stair training;Functional mobility training;Therapeutic activities;Therapeutic exercise;Balance training;Neuromuscular re-education;Patient/family education   PT Goals Acute Rehab PT Goals PT Goal Formulation: With patient Time For Goal Achievement: 02/10/13 Potential to Achieve Goals: Good Pt will go Sit to Stand: with modified independence PT Goal: Sit to Stand - Progress: Goal set today Pt will go Stand to Sit: with modified independence PT Goal: Stand to Sit - Progress: Goal set today Pt will Transfer Bed to Chair/Chair to Bed: with modified independence PT Transfer Goal: Bed to Chair/Chair to Bed - Progress: Goal set today Pt will Ambulate: >150 feet;with modified independence;with least restrictive assistive device PT Goal: Ambulate - Progress: Goal set today Pt will Go Up / Down Stairs: 1-2 stairs;with rail(s);with supervision PT Goal: Up/Down Stairs - Progress: Goal set today  Visit Information  Last PT Received On: 02/03/13 Assistance Needed: +1 Reason Eval/Treat Not Completed: Other (comment)    Subjective Data  Subjective: I wish I could just stay until tomorrow. Insurance will pay for that right? Patient Stated Goal: home, stronger   Prior Functioning  Home Living Lives With: Family (sister) Available Help at Discharge: Family;Available PRN/intermittently Type of Home: Mobile home Home Access: Stairs to enter Entrance Stairs-Number of Steps: 3-4 Entrance Stairs-Rails: Right;Left;Can reach both Home Layout: One level Home  Adaptive Equipment: Shower chair with back;Walker - rolling;Walker -  four wheeled Prior Function Level of Independence: Independent Able to Take Stairs?: Yes Driving: Yes Vocation: On disability Communication Communication: HOH    Cognition  Cognition Overall Cognitive Status: Appears within functional limits for tasks assessed/performed Arousal/Alertness: Awake/alert Orientation Level: Appears intact for tasks assessed Behavior During Session: [facility] for tasks performed Cognition - Other Comments: says she is hearing a motor in her head     Extremity/Trunk Assessment Right Upper Extremity Assessment RUE ROM/Strength/Tone: WFL for tasks assessed Left Upper Extremity Assessment LUE ROM/Strength/Tone: WFL for tasks assessed Right Lower Extremity Assessment RLE ROM/Strength/Tone: Deficits RLE ROM/Strength/Tone Deficits: pt c/o weakness in her knees, grossly 3-4/5 Left Lower Extremity Assessment LLE ROM/Strength/Tone: Deficits LLE ROM/Strength/Tone Deficits: c.o weakness in her knees, grossly 3-4/5   Balance Balance Balance Assessed: Yes Static Sitting Balance Static Sitting - Balance Support: No upper extremity supported Static Sitting - Level of Assistance: 7: Independent Static Standing Balance Static Standing - Balance Support: Bilateral upper extremity supported Static Standing - Level of Assistance: 5: Stand by assistance  End of Session PT - End of Session Equipment Utilized During Treatment: Gait belt Activity Tolerance: Patient tolerated treatment well Patient left: in bed;with call bell/phone within reach Nurse Communication: Mobility status  GP     [facility] HELEN 02/03/2013, 3:54 PM

## 2013-02-03 NOTE — Progress Notes (Signed)
PT Cancellation Note  Patient Details Name: JEAN ALEJOS MRN: [id number] DOB: 07-26-1935   Cancelled Treatment:    Reason Eval/Treat Not Completed: Patient eating right now, will return at or around 1:30 to assess mobility.    [facility] HELEN 02/03/2013, 12:14 PM Pager: [phone]

## 2013-02-03 NOTE — Progress Notes (Signed)
Patient states hearing motor sounds in her head, patient also experiencing tremors. Vital signs assessed and Ledora Bottcher, RN notified about the changes in patients status. Antonieta Iba, SN

## 2013-02-03 NOTE — Discharge Summary (Signed)
Physician Discharge Summary  [name] JWJ:[id number] DOB: 1935-06-08 DOA: 01/30/2013  PCP: [name], MD  Admit date: 01/30/2013 Discharge date: 02/03/2013  Time spent: 45 minutes  Recommendations for Outpatient Follow-up:  1. Home health Physical therapy has been set up 2. Follow up with primary care doctor in 2 weeks.  Discharge Diagnoses:  Active Problems:   HTN (hypertension)   COPD with acute exacerbation   Abdominal  pain, other specified site   Constipation   Hypokalemia   Discharge Condition: improved  Diet recommendation: low salt  Filed Weights   02/01/13 0548 02/02/13 0559 02/03/13 0437  Weight: 81.5 kg (179 lb 10.8 oz) 81.9 kg (180 lb 8.9 oz) 82.4 kg (181 lb 10.5 oz)    History of present illness:  1 day hx of abdominal pain that is diffuse she reports severe constipation. She have not had a BM for the past 3  Days. It seems she has hard time having a BM even when she tries to use a laxative. She takes chronic Vicodin for back pain. Today she also developed sever shortness of breath and wheezing. She has hx of COPD on 2 L of O2 at home. She is not on any inhalers at home. She has some chronic wheezing but today it was worse than her baseline. Patient presented to ED and her wheezing did not resolved despite steroid IV and Nebulizers. Family denies any recent cough. Patient reports she's been having severe pain all away from her neck down into her anal region. She says the pain is so severe she she is feeling that she's going to die. Patient's family felt that she was clammy at home and called 911.  Family states this is not her usual pain and she is acting differently from her baseline. Patient has trouble staying still in bed she says because of pain. She have had some nausea but no vomiting.    Hospital Course:  COPD with acute exacerbation/Chronic respiratory failure: Patient has known O2 dependent COPD, and presented with severe shortness of breath  and wheezing, over her baseline. She also has had a cough, productive of yellow phlegm CXR is devoid of active disease, as is Chest CTA. Managnig with prednisone, bronchodilator nebulizers, and Levaquin, now day# 4, as well as Mucinex. Clinically, patient has shown considerable improvement and is devoid of wheeze today. Patient ambulated 263ft with physical therapy and tolerated well.  She has been started on prednisone taper.  She will receive a prescription for spiriva and albuterol.  Will defer to primary care doctor regarding starting inhaled steroids/long acting bronchodilators.  She has been advised to quit smoking. 2. Abdominal pain/Constipation: Patient is troubled by chronic constipation, likely secondary to her opioid analgesics, and reportedly, her last bowel movement was on 01/28/13. She also presented with generalized abdominal pain/discomfort. Abdominal /pelvic CTA revealed no acute or concerning findings. Optimizing bowel regimen. Abdominal discomfort has improved, and patient is passing small amounts of stool.  3. Chronic back pain: Patient has a history of lumbar DJD/chronic back pain, s/p surgery, with placement of hardware, and is chronically on opioid analgesics for this. Stable at this time.  4. HTN (hypertension): BP is controlled on pre-admission anti-hypertensives.  5. Hypokalemia: Repleted as indicated. Magnesium was borderline at 1.5 on 01/31/13. Repleted.  6. Tremors/Anxiety. Patient restarted on outpatient propranolol since she is no longer wheezing.    Procedures: CXR/Abdominal X-Ray.  Chest CTA.  CTA Abdomen/Pelvis.    Consultations:  none  Discharge Exam: Filed Vitals:  02/03/13 0940 02/03/13 1111 02/03/13 1419 02/03/13 1459  BP: 181/101 168/82 152/80   Pulse: 108 86 90   Temp: 97.4 F (36.3 C)  97.8 F (36.6 C)   TempSrc: Oral  Oral   Resp: 20 20 20    Height:      Weight:      SpO2: 93% 93% 94% 96%    General: NAD Cardiovascular: S1, S2,  RRR Respiratory: CTA B  Discharge Instructions  Discharge Orders   Future Orders Complete By Expires     Call MD for:  difficulty breathing, headache or visual disturbances  As directed     Call MD for:  temperature >100.4  As directed     Diet - low sodium heart healthy  As directed     Increase activity slowly  As directed         Medication List    TAKE these medications       albuterol 108 (90 BASE) MCG/ACT inhaler  Commonly known as:  PROVENTIL HFA;VENTOLIN HFA  Inhale 2 puffs into the lungs every 4 (four) hours as needed for wheezing.     cholecalciferol 1000 UNITS tablet  Commonly known as:  VITAMIN D  Take 1,000 Units by mouth daily.     clopidogrel 75 MG tablet  Commonly known as:  PLAVIX  Take 1 tablet (75 mg total) by mouth daily.     guaiFENesin 600 MG 12 hr tablet  Commonly known as:  MUCINEX  Take 1 tablet (600 mg total) by mouth 2 (two) times daily.     hydrochlorothiazide 25 MG tablet  Commonly known as:  HYDRODIURIL  Take 25 mg by mouth daily.     HYDROcodone-acetaminophen 10-325 MG per tablet  Commonly known as:  NORCO  Take 1 tablet by mouth every 4 (four) hours as needed. For pain     isosorbide mononitrate 30 MG 24 hr tablet  Commonly known as:  IMDUR  Take 30 mg by mouth daily.     levofloxacin 500 MG tablet  Commonly known as:  LEVAQUIN  Take 1 tablet (500 mg total) by mouth daily.     nitroGLYCERIN 0.4 MG SL tablet  Commonly known as:  NITROSTAT  Place 1 tablet (0.4 mg total) under the tongue every 5 (five) minutes as needed for chest pain.     predniSONE 10 MG tablet  Commonly known as:  DELTASONE  Take 40mg  po daily for 2 days then 30mg  po daily for 2 days then 20mg  po daily for 2 days then 10mg  po daily for 2 days then stop     propranolol 40 MG tablet  Commonly known as:  INDERAL  Take 40 mg by mouth 2 (two) times daily.     ranitidine 300 MG tablet  Commonly known as:  ZANTAC  Take 300 mg by mouth daily.     tiotropium 18  MCG inhalation capsule  Commonly known as:  SPIRIVA  Place 1 capsule (18 mcg total) into inhaler and inhale daily.          The results of significant diagnostics from this hospitalization (including imaging, microbiology, ancillary and laboratory) are listed below for reference.    Significant Diagnostic Studies: Ct Angio Chest Pe W/cm &/or Wo Cm  01/31/2013  *RADIOLOGY REPORT*  Clinical Data:  Shortness of breath.  Chest and abdomen pain.  CT ANGIOGRAPHY CHEST, ABDOMEN AND PELVIS  Technique:  Multidetector CT imaging through the chest, abdomen and pelvis was performed using the standard  protocol during bolus administration of intravenous contrast.  Multiplanar reconstructed images including MIPs were obtained and reviewed to evaluate the vascular anatomy.  Contrast: OMNIPAQUE IOHEXOL 350 MG/ML SOLN  Comparison:  CT abdomen and pelvis 01/27/2012  CTA CHEST  Findings:  Noncontrast CT images of the chest demonstrate normal caliber thoracic aorta with diffusely scattered aortic calcifications.  No evidence of intramural hematoma. Calcifications in the coronary arteries.  Calcified granuloma in the right upper lung.  Images obtained during arterial phase of contrast enhancement demonstrate normal heart size.  Normal caliber thoracic aorta.  No evidence of dissection.  Typical branching pattern of the great vessels.  No abnormal mediastinal fluid collections.  No significant mediastinal lymphadenopathy.  Esophagus is decompressed.  Visualized central and proximal segmental pulmonary arteries are patent without evidence of pulmonary embolus.  There is atelectasis in both lung bases.  Diffuse emphysematous change throughout the lungs with scattered interstitial fibrosis. No focal consolidation or airspace disease.  No pneumothorax. Airways appear patent with peribronchial thickening suggesting chronic bronchitis.  Degenerative changes throughout the thoracic spine.   Review of the MIP images confirms  the above findings.  IMPRESSION: No evidence of aortic dissection or aneurysm.  Atelectasis in both lung bases.  Diffuse emphysematous changes with scattered fibrosis in the lungs.  CTA ABDOMEN AND PELVIS  Findings:  Calcification of the aorta and iliac vessels as well as abdominal branch vessels.  Normal caliber abdominal aorta.  The abdominal aorta, celiac axis, superior mesenteric artery, single bilateral renal arteries, inferior mesenteric artery, and bilateral common iliac, external iliac, and common femoral arteries are patent.  No evidence of dissection or occlusion.  Renal nephrograms appear symmetrical.  The liver, spleen, gallbladder, pancreas, and retroperitoneal lymph nodes are unremarkable.  Mild prominence of the adrenal gland suggesting hyperplasia versus adenomas.  This is stable since previous study.  There is a right-sided lumbar hernia containing fat.  This may be postoperative, related to lumbar spine surgery. This is stable.  There is parenchymal atrophy in the kidneys with focal scarring in the left kidney.  Probable bilateral renal cysts. These are stable since previous study.  No pyelocaliectasis.  The stomach, small bowel, and colon are not abnormally distended. No free air or free fluid in the abdomen.  Small umbilical hernia containing fat.  Pelvis:  The bladder wall is not thickened.  Uterus is surgically absent.  No abnormal adnexal masses.  Appendix is normal. Diverticula in the sigmoid colon without diverticulitis. Postoperative changes in the lumbar spine with posterior fixation and intervertebral disc fusion from L1-L5.  Mild retrolisthesis of L4 on L5.   Review of the MIP images confirms the above findings.  IMPRESSION: No evidence of aortic dissection or occlusion.  No aortic aneurysm. Otherwise stable appearance of the abdomen and pelvis.  See multiple incidental findings above.   Original Report Authenticated By: Burman Nieves, M.D.    Dg Abd Acute W/chest  01/30/2013   *RADIOLOGY REPORT*  Clinical Data: Bloating.  Upper abdominal pain.  ACUTE ABDOMEN SERIES (ABDOMEN 2 VIEW & CHEST 1 VIEW)  Comparison: 02/27/2012  Findings: Bowel gas pattern does not show evidence of ileus, obstruction or free air.  There is a moderate amount of fecal matter in the colon.  There is been previous lumbar region discectomy and fusion.  One-view chest shows normal heart size.  There is unfolding of the aorta.  There is chronic scarring at the lung bases.  Volumes are somewhat diminished, presumed secondary to poor inspiration.  Some  actual basilar atelectasis is not excluded.  IMPRESSION: Bowel gas pattern within normal limits.  Chronic increased markings at the bases.  This appears slightly more pronounced on the left, presumed secondary to a poor inspiration.  The possibility of some active atelectasis in the left lower lobe is not excluded.   Original Report Authenticated By: Paulina Fusi, M.D.    Ct Cta Abd/pel W/cm &/or W/o Cm  01/31/2013  *RADIOLOGY REPORT*  Clinical Data:  Shortness of breath.  Chest and abdomen pain.  CT ANGIOGRAPHY CHEST, ABDOMEN AND PELVIS  Technique:  Multidetector CT imaging through the chest, abdomen and pelvis was performed using the standard protocol during bolus administration of intravenous contrast.  Multiplanar reconstructed images including MIPs were obtained and reviewed to evaluate the vascular anatomy.  Contrast: OMNIPAQUE IOHEXOL 350 MG/ML SOLN  Comparison:  CT abdomen and pelvis 01/27/2012  CTA CHEST  Findings:  Noncontrast CT images of the chest demonstrate normal caliber thoracic aorta with diffusely scattered aortic calcifications.  No evidence of intramural hematoma. Calcifications in the coronary arteries.  Calcified granuloma in the right upper lung.  Images obtained during arterial phase of contrast enhancement demonstrate normal heart size.  Normal caliber thoracic aorta.  No evidence of dissection.  Typical branching pattern of the great  vessels.  No abnormal mediastinal fluid collections.  No significant mediastinal lymphadenopathy.  Esophagus is decompressed.  Visualized central and proximal segmental pulmonary arteries are patent without evidence of pulmonary embolus.  There is atelectasis in both lung bases.  Diffuse emphysematous change throughout the lungs with scattered interstitial fibrosis. No focal consolidation or airspace disease.  No pneumothorax. Airways appear patent with peribronchial thickening suggesting chronic bronchitis.  Degenerative changes throughout the thoracic spine.   Review of the MIP images confirms the above findings.  IMPRESSION: No evidence of aortic dissection or aneurysm.  Atelectasis in both lung bases.  Diffuse emphysematous changes with scattered fibrosis in the lungs.  CTA ABDOMEN AND PELVIS  Findings:  Calcification of the aorta and iliac vessels as well as abdominal branch vessels.  Normal caliber abdominal aorta.  The abdominal aorta, celiac axis, superior mesenteric artery, single bilateral renal arteries, inferior mesenteric artery, and bilateral common iliac, external iliac, and common femoral arteries are patent.  No evidence of dissection or occlusion.  Renal nephrograms appear symmetrical.  The liver, spleen, gallbladder, pancreas, and retroperitoneal lymph nodes are unremarkable.  Mild prominence of the adrenal gland suggesting hyperplasia versus adenomas.  This is stable since previous study.  There is a right-sided lumbar hernia containing fat.  This may be postoperative, related to lumbar spine surgery. This is stable.  There is parenchymal atrophy in the kidneys with focal scarring in the left kidney.  Probable bilateral renal cysts. These are stable since previous study.  No pyelocaliectasis.  The stomach, small bowel, and colon are not abnormally distended. No free air or free fluid in the abdomen.  Small umbilical hernia containing fat.  Pelvis:  The bladder wall is not thickened.  Uterus is  surgically absent.  No abnormal adnexal masses.  Appendix is normal. Diverticula in the sigmoid colon without diverticulitis. Postoperative changes in the lumbar spine with posterior fixation and intervertebral disc fusion from L1-L5.  Mild retrolisthesis of L4 on L5.   Review of the MIP images confirms the above findings.  IMPRESSION: No evidence of aortic dissection or occlusion.  No aortic aneurysm. Otherwise stable appearance of the abdomen and pelvis.  See multiple incidental findings above.   Original Report  Authenticated By: Burman Nieves, M.D.     Microbiology: Recent Results (from the past 240 hour(s))  URINE CULTURE     Status: None   Collection Time    01/31/13  1:43 AM      Result Value Range Status   Specimen Description URINE, CLEAN CATCH   Final   Special Requests Normal   Final   Culture  Setup Time 01/31/2013 14:57   Final   Colony Count NO GROWTH   Final   Culture NO GROWTH   Final   Report Status 02/01/2013 FINAL   Final     Labs: Basic Metabolic Panel:  Recent Labs Lab 01/30/13 1852 01/31/13 0450 02/01/13 0630 02/02/13 0612 02/03/13 0620  NA 134* 135 134* 134* 132*  K 3.4* 3.7 4.7 4.8 4.3  CL 94* 95* 97 97 94*  CO2  --  28 28 27 24   GLUCOSE 124* 184* 149* 160* 158*  BUN 14 12 21  28* 30*  CREATININE 1.00 0.91 0.89 0.96 0.94  CALCIUM  --  9.6 9.3 9.1 9.1  MG  --  1.5 2.2  --   --   PHOS  --  1.9*  --   --   --    Liver Function Tests:  Recent Labs Lab 01/31/13 0450  AST 15  ALT 13  ALKPHOS 46  BILITOT 0.2*  PROT 6.8  ALBUMIN 3.4*   No results found for this basename: LIPASE, AMYLASE,  in the last 168 hours No results found for this basename: AMMONIA,  in the last 168 hours CBC:  Recent Labs Lab 01/30/13 1831 01/30/13 1852 01/31/13 0450 02/01/13 0630 02/02/13 0612 02/03/13 0620  WBC 8.3  --  9.8 18.2* 15.0* 14.7*  NEUTROABS 5.9  --   --   --   --   --   HGB 13.5 13.6 14.4 13.6 14.2 14.7  HCT 38.6 40.0 41.1 38.9 41.2 41.7  MCV 92.1   --  92.4 92.6 94.5 93.1  PLT 197  --  215 207 211 221   Cardiac Enzymes:  Recent Labs Lab 01/31/13 0138 01/31/13 0721 01/31/13 1225  TROPONINI <0.30 <0.30 <0.30   BNP: BNP (last 3 results) No results found for this basename: PROBNP,  in the last 8760 hours CBG: No results found for this basename: GLUCAP,  in the last 168 hours     Signed:  Cyrene Gharibian  [facility] 02/03/2013, 4:02 PM

## 2013-02-03 NOTE — Progress Notes (Signed)
02/03/13 1615 In to speak with pt. about Home Health needs.  Pt. is interested in having home health, and uses [facility] for her oxygen, and would like to use them for [facility].  TC to [place], with [facility], to give referral for [facility] PT.  Pt. to dc home today.  Pt.'s daughter will be coming to transport pt. home. Tera Mater, RN, BSN NCM [phone]

## 2013-02-03 NOTE — Progress Notes (Signed)
[facility] PROGRESS NOTE  [name] ZOX:[id number] DOB: [birth year] DOA: 01/30/2013 PCP: [name], MD  Assessment/Plan: Active Problems:   HTN (hypertension)   COPD with acute exacerbation   Abdominal  pain, other specified site   Constipation   Hypokalemia    1. COPD with acute exacerbation/Chronic respiratory failure: Patient has known O2 dependent COPD, and presented with severe shortness of breath and wheezing, over her baseline. She also has had a cough, productive of yellow phlegm CXR is devoid of active disease, as is Chest CTA. Managnig with prednisone, bronchodilator nebulizers, and Levaquin, now day# 4, as well as Mucinex. Clinically, patient has shown considerable improvement and is devoid of wheeze today. Will attempt to ambulate the patient on oxygen and monitor oxygen saturations and well as respiratory effort.  2. Abdominal pain/Constipation: Patient is troubled by chronic constipation, likely secondary to her opioid analgesics, and reportedly, her last bowel movement was on 01/28/13. She also presented with generalized abdominal pain/discomfort. Abdominal /pelvic CTA revealed no acute or concerning findings. Optimizing bowel regimen. Abdominal discomfort has improved, and patient is passing small amounts of stool.  3. Chronic back pain: Patient has a history of lumbar DJD/chronic back pain, s/p surgery, with placement of hardware, and is chronically on opioid analgesics for this. Stable at this time.  4. HTN (hypertension): BP is controlled on pre-admission anti-hypertensives.  5. Hypokalemia: Repleted as indicated. Magnesium was borderline at 1.5 on 01/31/13. Repleted. 6. Tremors/Anxiety.  Will restart outpatient propranolol since she is no longer wheezing.  Will also try low dose xanax.    Code Status: DNR/DNI Family Communication:  Disposition Plan: Physical therapy to see today for discharge disposition.   Brief narrative: 78 y.o. female with  history of Hyperlipemia, HTN, GERD, CAD s/p PCI/Stent, O2-dependent COPD (On 2L/min), deafness, Arthritis, Lumbar DJD/Back pain, s/p surgery, with placement of hardware, presenting with a 1 day history of diffuse abdominal pain. She reports severe constipation and has not had a BM for the past 3 days. It seems she has hard time having a BM even when she tries to use a laxative. She had nausea, but no vomiting. She takes chronic Vicodin for back pain. On 01/30/13, she also developed severe shortness of breath and wheezing. She is not on any inhalers at home. She has some chronic wheezing but on 01/30/13, it was worse than her baseline. Patient presented to ED and her wheezing did not resolved despite steroid IV and Nebulizers. Family denies any recent cough. Patient reports she's been having severe pain all away from her neck down into her anal region. She says the pain is so severe she she is feeling that she's going to die. Patient's family felt that she was clammy at home and called 911.     Consultants:  N/A.   Procedures:  CXR/Abdominal X-Ray.   Chest CTA.  CTA Abdomen/Pelvis.   Antibiotics:  Levaquin 01/31/13>>>  HPI/Subjective: Much better.   Objective: Vital signs in last 24 hours: Temp:  [97.4 F (36.3 C)-97.9 F (36.6 C)] 97.4 F (36.3 C) (03/19 0940) Pulse Rate:  [81-108] 108 (03/19 0940) Resp:  [19-20] 20 (03/19 0940) BP: (152-181)/(67-101) 181/101 mmHg (03/19 0940) SpO2:  [90 %-95 %] 93 % (03/19 0940) Weight:  [82.4 kg (181 lb 10.5 oz)] 82.4 kg (181 lb 10.5 oz) (03/19 0437) Weight change: 0.5 kg (1 lb 1.6 oz) Last BM Date: 02/03/13  Intake/Output from previous day: 03/18 0701 - 03/19 0700 In: 1080 [P.O.:1080] Out: 1550 [Urine:1550] Total I/O  In: 240 [P.O.:240] Out: 201 [Urine:200; Stool:1]   Physical Exam: General: Alert, hard of hearing, communicative, fully oriented, not short of breath at rest.  HEENT:  No clinical pallor, no jaundice, no conjunctival  injection or discharge. Hydration is fair.  NECK:  Supple, JVP not seen, no carotid bruits, no palpable lymphadenopathy, no palpable goiter. CHEST:  Clinically clear today. HEART:  Sounds 1 and 2 heard, normal, regular, no murmurs. ABDOMEN:  Moderately obese, soft, non-tender. No palpable organomegaly, no palpable masses, normal bowel sounds. GENITALIA:  Not examined. LOWER EXTREMITIES:  No pitting edema, palpable peripheral pulses. MUSCULOSKELETAL SYSTEM:  Generalized osteoarthritic changes, otherwise, normal. CENTRAL NERVOUS SYSTEM:  No focal neurologic deficit on gross examination.  Lab Results:  Recent Labs  02/02/13 0612 02/03/13 0620  WBC 15.0* 14.7*  HGB 14.2 14.7  HCT 41.2 41.7  PLT 211 221    Recent Labs  02/02/13 0612 02/03/13 0620  NA 134* 132*  K 4.8 4.3  CL 97 94*  CO2 27 24  GLUCOSE 160* 158*  BUN 28* 30*  CREATININE 0.96 0.94  CALCIUM 9.1 9.1   Recent Results (from the past 240 hour(s))  URINE CULTURE     Status: None   Collection Time    01/31/13  1:43 AM      Result Value Range Status   Specimen Description URINE, CLEAN CATCH   Final   Special Requests Normal   Final   Culture  Setup Time 01/31/2013 14:57   Final   Colony Count NO GROWTH   Final   Culture NO GROWTH   Final   Report Status 02/01/2013 FINAL   Final     Studies/Results: No results found.  Medications: Scheduled Meds: . albuterol  2.5 mg Nebulization TID  . clopidogrel  75 mg Oral Daily  . docusate sodium  100 mg Oral BID  . enoxaparin (LOVENOX) injection  40 mg Subcutaneous Q24H  . famotidine  20 mg Oral Daily  . guaiFENesin  600 mg Oral BID  . isosorbide mononitrate  30 mg Oral Daily  . levofloxacin  500 mg Oral Daily  . pantoprazole  40 mg Oral Q1200  . polyethylene glycol  17 g Oral BID  . predniSONE  40 mg Oral Q breakfast  . propranolol  40 mg Oral BID  . sodium chloride  3 mL Intravenous Q12H  . tiotropium  18 mcg Inhalation Daily   Continuous Infusions:  PRN  Meds:.acetaminophen, acetaminophen, albuterol, ALPRAZolam, HYDROcodone-acetaminophen, morphine injection, ondansetron (ZOFRAN) IV, ondansetron, sodium phosphate    LOS: 4 days   MEMON,JEHANZEB  [facility] Pager [phone]. If 8PM-8AM, please contact night-coverage at [email or web address], password [facility] 02/03/2013, 10:04 AM  LOS: 4 days

## 2013-02-08 NOTE — Progress Notes (Signed)
02/08/13 1030 TC to pt. [phone]), in reference to her concerns about [facility] not making a visit for [facility] PT yet, and her oxygen.  Pt. stated [facility] has not made a visit yet, and she only has one tank at home that is empty.  This NCM called [facility] to inquire of these concerns.  Debbie, with [facility], stated that pt. would receive a home visit today, and that the pt. has not had oxygen with them since 2012. This NCM informed pt. she would be receiving a home visit today from [facility] PT, and asked pt. more about her oxygen.  Pt. stated that indeed [facility] did come get their supplies from her a while back, however she still had one empty tank left.  In addition, she states her oxygen sats at home are 98% on r/a.  Explained to pt. that she would not qualify for oxgyen with sats >88%, and that the physician did not order any oxygen at discharge.  Pt. stated she understood, and appreciated the information.  Encouraged pt. to make follow up appointment with her PCP Dr. Jeannetta Nap to discuss her concerns.  She stated she would.   Tera Mater, RN, BSN NCM [phone]

## 2013-05-07 ENCOUNTER — Emergency Department (HOSPITAL_COMMUNITY): Payer: Medicare Other

## 2013-05-07 ENCOUNTER — Emergency Department (HOSPITAL_COMMUNITY)
Admission: EM | Admit: 2013-05-07 | Discharge: 2013-05-07 | Disposition: A | Discharge status: Home / Self Care | Payer: Medicare Other | Attending: Emergency Medicine | Admitting: Emergency Medicine

## 2013-05-07 ENCOUNTER — Encounter (HOSPITAL_COMMUNITY): Payer: Self-pay | Admitting: Emergency Medicine

## 2013-05-07 DIAGNOSIS — H9190 Unspecified hearing loss, unspecified ear: Secondary | ICD-10-CM | POA: Insufficient documentation

## 2013-05-07 DIAGNOSIS — Z9861 Coronary angioplasty status: Secondary | ICD-10-CM | POA: Insufficient documentation

## 2013-05-07 DIAGNOSIS — R109 Unspecified abdominal pain: Secondary | ICD-10-CM | POA: Insufficient documentation

## 2013-05-07 DIAGNOSIS — I2510 Atherosclerotic heart disease of native coronary artery without angina pectoris: Secondary | ICD-10-CM | POA: Insufficient documentation

## 2013-05-07 DIAGNOSIS — J4489 Other specified chronic obstructive pulmonary disease: Secondary | ICD-10-CM | POA: Insufficient documentation

## 2013-05-07 DIAGNOSIS — E785 Hyperlipidemia, unspecified: Secondary | ICD-10-CM | POA: Insufficient documentation

## 2013-05-07 DIAGNOSIS — K219 Gastro-esophageal reflux disease without esophagitis: Secondary | ICD-10-CM | POA: Insufficient documentation

## 2013-05-07 DIAGNOSIS — K5900 Constipation, unspecified: Secondary | ICD-10-CM | POA: Insufficient documentation

## 2013-05-07 DIAGNOSIS — F17200 Nicotine dependence, unspecified, uncomplicated: Secondary | ICD-10-CM | POA: Insufficient documentation

## 2013-05-07 DIAGNOSIS — J449 Chronic obstructive pulmonary disease, unspecified: Secondary | ICD-10-CM | POA: Insufficient documentation

## 2013-05-07 DIAGNOSIS — Z8679 Personal history of other diseases of the circulatory system: Secondary | ICD-10-CM | POA: Insufficient documentation

## 2013-05-07 DIAGNOSIS — M129 Arthropathy, unspecified: Secondary | ICD-10-CM | POA: Insufficient documentation

## 2013-05-07 DIAGNOSIS — Z90710 Acquired absence of both cervix and uterus: Secondary | ICD-10-CM | POA: Insufficient documentation

## 2013-05-07 DIAGNOSIS — I10 Essential (primary) hypertension: Secondary | ICD-10-CM | POA: Insufficient documentation

## 2013-05-07 DIAGNOSIS — Z79899 Other long term (current) drug therapy: Secondary | ICD-10-CM | POA: Insufficient documentation

## 2013-05-07 LAB — CBC
HCT: 45.6 % (ref 36.0–46.0)
Hemoglobin: 15.8 g/dL — ABNORMAL HIGH (ref 12.0–15.0)
MCH: 32.2 pg (ref 26.0–34.0)
RBC: 4.91 MIL/uL (ref 3.87–5.11)

## 2013-05-07 LAB — COMPREHENSIVE METABOLIC PANEL
ALT: 16 U/L (ref 0–35)
Alkaline Phosphatase: 51 U/L (ref 39–117)
CO2: 27 mEq/L (ref 19–32)
GFR calc Af Amer: 72 mL/min — ABNORMAL LOW (ref >90)
GFR calc non Af Amer: 62 mL/min — ABNORMAL LOW (ref >90)
Glucose, Bld: 114 mg/dL — ABNORMAL HIGH (ref 70–99)
Potassium: 4 mEq/L (ref 3.5–5.1)
Sodium: 134 mEq/L — ABNORMAL LOW (ref 135–145)

## 2013-05-07 LAB — URINALYSIS, ROUTINE W REFLEX MICROSCOPIC
Bilirubin Urine: NEGATIVE
Hgb urine dipstick: NEGATIVE
Ketones, ur: NEGATIVE mg/dL
Specific Gravity, Urine: 1.024 (ref 1.005–1.030)
Urobilinogen, UA: 0.2 mg/dL (ref 0.0–1.0)

## 2013-05-07 LAB — URINE MICROSCOPIC-ADD ON

## 2013-05-07 MED ORDER — POLYETHYLENE GLYCOL 3350 17 GM/SCOOP PO POWD
17.0000 g | Freq: Every day | ORAL | Status: DC
Start: 2013-05-07 — End: 2014-01-12

## 2013-05-07 NOTE — ED Provider Notes (Signed)
History     CSN: [id number]  Arrival date & time 05/07/13  1310   First MD Initiated Contact with Patient 05/07/13 1501      Chief Complaint  Patient presents with  . Generalized Body Aches    (Consider location/radiation/quality/duration/timing/severity/associated sxs/prior treatment) HPI A LEVEL 5 CAVEAT PERTAINS DUE TO POOR HISTORIAN.  Pt gives a very rambling history of diffuse body pain.  Then tells of abdominal pain which is in lower abdomen and upper abdomen- when asked how long it has been going on she states "at least a month, maybe a year or more".  She has been given bentyl rx from her doctor which has not helped.  She cannot describe her diffuse aches and pains any more.  No fever.  States she vomited approx 1 week ago, but none since.  She also c/o feeling of constipation, has been having bowel movements- but feels like she can't have a large enough bowel movement.  No blood in stool.  Past Medical History  Diagnosis Date  . Hyperlipemia   . Unspecified essential hypertension   . GERD (gastroesophageal reflux disease)   . CAD (coronary artery disease)     Multiple percutaneous revascularization. Catheterization May 2013 left main normal, patent LAD stent, 90% circumflex stenosis, patent right coronary artery stents. Patient had a DES to the circumflex. The EF was well-preserved.  Marland Kitchen COPD (chronic obstructive pulmonary disease)   . Anginal pain   . Hard of hearing   . Arthritis     "aching bones sometimes"    Past Surgical History  Procedure Laterality Date  . Lumbar spine surgery  04/2011    Rods, screws and cages  . Back surgery    . Abdominal hysterectomy    . Coronary angioplasty with stent placement  2002-2009  . Coronary angioplasty with stent placement  05/06/12    "1; this makes 5"  . Esophageal dilation  ~ 2012    Dr. Arlyce Dice    Family History  Problem Relation Age of Onset  . Glaucoma Father   . Bone cancer Mother     History  Substance Use Topics   . Smoking status: Current Every Day Smoker -- 0.50 packs/day for 55 years    Types: Cigarettes  . Smokeless tobacco: Former Neurosurgeon    Types: Snuff     Comment: "dipped snuff when I was little"  . Alcohol Use: No    OB History   Grav Para Term Preterm Abortions TAB SAB Ect Mult Living                  Review of Systems UNABLE TO OBTAIN ROS DUE TO LEVEL 5 CAVEAT  Allergies  Iohexol and Lipitor  Home Medications   Current Outpatient Rx  Name  Route  Sig  Dispense  Refill  . albuterol (PROVENTIL HFA;VENTOLIN HFA) 108 (90 BASE) MCG/ACT inhaler   Inhalation   Inhale 2 puffs into the lungs every 4 (four) hours as needed for wheezing.   1 Inhaler   2   . cholecalciferol (VITAMIN D) 1000 UNITS tablet   Oral   Take 1,000 Units by mouth daily.          . clopidogrel (PLAVIX) 75 MG tablet   Oral   Take 1 tablet (75 mg total) by mouth daily.   30 tablet   11   . guaiFENesin (MUCINEX) 600 MG 12 hr tablet   Oral   Take 1 tablet (600 mg total) by mouth  2 (two) times daily.   14 tablet   0   . hydrochlorothiazide (HYDRODIURIL) 25 MG tablet   Oral   Take 25 mg by mouth daily.          . isosorbide mononitrate (IMDUR) 30 MG 24 hr tablet   Oral   Take 30 mg by mouth daily.         Marland Kitchen levofloxacin (LEVAQUIN) 500 MG tablet   Oral   Take 1 tablet (500 mg total) by mouth daily.   3 tablet   0   . nitroGLYCERIN (NITROSTAT) 0.4 MG SL tablet   Sublingual   Place 1 tablet (0.4 mg total) under the tongue every 5 (five) minutes as needed for chest pain.   25 tablet   2   . propranolol (INDERAL) 40 MG tablet   Oral   Take 40 mg by mouth 2 (two) times daily.         . ranitidine (ZANTAC) 300 MG tablet   Oral   Take 300 mg by mouth daily.         Marland Kitchen tiotropium (SPIRIVA) 18 MCG inhalation capsule   Inhalation   Place 1 capsule (18 mcg total) into inhaler and inhale daily.   30 capsule   1   . polyethylene glycol powder (MIRALAX) powder   Oral   Take 17 g by  mouth daily.   255 g   0     BP 158/89  Pulse 78  Temp(Src) 97.4 F (36.3 C) (Oral)  Resp 22  SpO2 95% Vitals reviewd Physical Exam Physical Examination: General appearance - alert, well appearing, and in no distress Mental status - alert, oriented to person, place, and time Eyes - no scleral icterus, no conjunctival injection Mouth - mucous membranes moist, pharynx normal without lesions Chest - clear to auscultation, no wheezes, rales or rhonchi, symmetric air entry Heart - normal rate, regular rhythm, normal S1, S2, no murmurs, rubs, clicks or gallops Abdomen - soft, diffuse mild tenderness,  No gaurding or rebound, nondistended, no masses or organomegaly, nabs Musculoskeletal - no joint tenderness, deformity or swelling, no ttp over large muscle bodies Extremities - peripheral pulses normal, no pedal edema, no clubbing or cyanosis Skin - normal coloration and turgor, no rashes  ED Course  Procedures (including critical care time)  Labs Reviewed  URINALYSIS, ROUTINE W REFLEX MICROSCOPIC - Abnormal; Notable for the following:    Leukocytes, UA TRACE (*)    All other components within normal limits  URINE MICROSCOPIC-ADD ON - Abnormal; Notable for the following:    Squamous Epithelial / LPF FEW (*)    Bacteria, UA FEW (*)    All other components within normal limits  CBC - Abnormal; Notable for the following:    Hemoglobin 15.8 (*)    All other components within normal limits  COMPREHENSIVE METABOLIC PANEL - Abnormal; Notable for the following:    Sodium 134 (*)    Glucose, Bld 114 (*)    GFR calc non Af Amer 62 (*)    GFR calc Af Amer 72 (*)    All other components within normal limits  LIPASE, BLOOD   Dg Abd Acute W/chest  05/07/2013   *RADIOLOGY REPORT*  Clinical Data: Abdominal pain  ACUTE ABDOMEN SERIES (ABDOMEN 2 VIEW & CHEST 1 VIEW)  Comparison: 01/30/2013  Findings: Cardiomediastinal silhouette is unremarkable.  No acute infiltrate or pleural effusion.  No  pulmonary edema.  Metallic fixation rods lumbar spine again noted.  No  free abdominal air. There is nonspecific nonobstructive bowel gas pattern.  Moderate colonic stool.  IMPRESSION: No acute disease.  Nonspecific nonobstructive bowel gas pattern. Postsurgical changes lumbar spine again noted.   Original Report Authenticated By: Natasha Mead, M.D.     1. Constipation       MDM  Pt presenting with c/o diffuse pain, primarily abdominal pain which is chronic in nature and constipation.  Acute abd series xrays reveals moderate stool in colon.  Other labs and urinalysis are reassuring.    Per chart review patient had admission in 3/14 [facility]ation- she had similar complaints of constipation and abdominal pain- abdominal CT obtained and showed no acute findings.    All results and plan for discharge to take miralax d/w patient and daughter at bedside.  Suspect constipation is due to chronic pain meds for back pain.  Advised pt to call for f/u with her primary care doctor.  Discharged with strict return precautions.  Pt agreeable with plan.        Ethelda Chick, MD 05/07/13 [phone]

## 2013-05-07 NOTE — ED Notes (Addendum)
Pt from home via EMS reports generalized body aches. Pt adds that she has abd pain and has hx of IBS. Pt states that her rx pain meds are not working, pt also reports constipation. Pt reports that pain increases with eating. Pt is ambulatory, A&O and in NAD.

## 2013-05-25 ENCOUNTER — Other Ambulatory Visit: Payer: Self-pay | Admitting: Cardiology

## 2013-05-26 ENCOUNTER — Other Ambulatory Visit: Payer: Self-pay | Admitting: *Deleted

## 2013-05-26 MED ORDER — NITROGLYCERIN 0.4 MG SL SUBL: 0.4000 mg | SUBLINGUAL_TABLET | SUBLINGUAL | Status: DC | PRN

## 2013-06-28 ENCOUNTER — Other Ambulatory Visit: Payer: Self-pay

## 2013-06-28 MED ORDER — ISOSORBIDE MONONITRATE ER 30 MG PO TB24: ORAL_TABLET | ORAL | Status: DC

## 2013-07-02 ENCOUNTER — Ambulatory Visit (INDEPENDENT_AMBULATORY_CARE_PROVIDER_SITE_OTHER): Payer: Medicare Other | Admitting: Cardiology

## 2013-07-02 ENCOUNTER — Encounter: Payer: Self-pay | Admitting: Cardiology

## 2013-07-02 VITALS — BP 134/72 | HR 66 | Ht 63.0 in | Wt 175.4 lb

## 2013-07-02 DIAGNOSIS — I10 Essential (primary) hypertension: Secondary | ICD-10-CM

## 2013-07-02 DIAGNOSIS — I2510 Atherosclerotic heart disease of native coronary artery without angina pectoris: Secondary | ICD-10-CM

## 2013-07-02 DIAGNOSIS — I200 Unstable angina: Secondary | ICD-10-CM

## 2013-07-02 NOTE — Patient Instructions (Addendum)
The current medical regimen is effective;  continue present plan and medications.  Your physician has requested that you have a lexiscan myoview. For further information please visit [email or web address]. Please follow instruction sheet, as given.  Follow up in 1 year with Dr Hochrein.  You will receive a letter in the mail 2 months before you are due.  Please call us when you receive this letter to schedule your follow up appointment.  

## 2013-07-02 NOTE — Progress Notes (Signed)
HPI The patient presents for follow up of CAD. She's had a history of coronary disease with multiple stents.  Since I last saw her she washospitalized with dyspnea and found to have exacerbation of COPD. She was also managed for acute constipation.  Today she presents for followup she has had current chest and arm discomfort. She has diffuse muscle aches and pains and somatic complaints. It is somewhat difficult to sort this out from the angina that she's had in the past. She does describes substernal pressure and occasional left arm discomfort. This happens sporadically. She is limited in her activities but is not necessarily related to these activities. She has chronic shortness of breath but she's not describing any new PND or orthopnea. She's had no presyncope or syncope.  Allergies  Allergen Reactions  . Iohexol      Code: HIVES, Desc: PER ROBIN @ [facility], PT IS ALLERGIC TO IVP DYE 08/28/10/RM, Onset Date: [id number]   . Lipitor [Atorvastatin Calcium] Hives    Hives per pt. She states she thinks this is the right medicine    Current Outpatient Prescriptions  Medication Sig Dispense Refill  . albuterol (PROVENTIL HFA;VENTOLIN HFA) 108 (90 BASE) MCG/ACT inhaler Inhale 2 puffs into the lungs every 4 (four) hours as needed for wheezing.  1 Inhaler  2  . amitriptyline (ELAVIL) 10 MG tablet Take 10 mg by mouth at bedtime.       . cholecalciferol (VITAMIN D) 1000 UNITS tablet Take 1,000 Units by mouth daily.       Marland Kitchen guaiFENesin (MUCINEX) 600 MG 12 hr tablet Take 1 tablet (600 mg total) by mouth 2 (two) times daily.  14 tablet  0  . hydrochlorothiazide (HYDRODIURIL) 25 MG tablet Take 25 mg by mouth daily.       Marland Kitchen HYDROcodone-acetaminophen (NORCO) 10-325 MG per tablet Take 2 tablets by mouth every 12 (twelve) hours as needed.       . isosorbide mononitrate (IMDUR) 30 MG 24 hr tablet TAKE ONE TABLET BY MOUTH EVERY DAY  30 tablet  2  . nitroGLYCERIN (NITROSTAT) 0.4 MG SL tablet Place 1 tablet (0.4 mg  total) under the tongue every 5 (five) minutes as needed for chest pain.  25 tablet  5  . polyethylene glycol powder (MIRALAX) powder Take 17 g by mouth daily.  255 g  0  . propranolol (INDERAL) 40 MG tablet Take 20 mg by mouth 2 (two) times daily.       . ranitidine (ZANTAC) 300 MG tablet Take 300 mg by mouth daily.      Marland Kitchen tiotropium (SPIRIVA) 18 MCG inhalation capsule Place 1 capsule (18 mcg total) into inhaler and inhale daily.  30 capsule  1  . [DISCONTINUED] calcium carbonate (OS-CAL) 600 MG TABS Take 600 mg by mouth daily.        . [DISCONTINUED] pravastatin (PRAVACHOL) 40 MG tablet Take 40 mg by mouth daily.         No current facility-administered medications for this visit.    Past Medical History  Diagnosis Date  . Hyperlipemia   . Unspecified essential hypertension   . GERD (gastroesophageal reflux disease)   . CAD (coronary artery disease)     Multiple percutaneous revascularization. Catheterization May 2013 left main normal, patent LAD stent, 90% circumflex stenosis, patent right coronary artery stents. Patient had a DES to the circumflex. The EF was well-preserved.  Marland Kitchen COPD (chronic obstructive pulmonary disease)   . Anginal pain   . Hard of  hearing   . Arthritis     "aching bones sometimes"    Past Surgical History  Procedure Laterality Date  . Lumbar spine surgery  04/2011    Rods, screws and cages  . Back surgery    . Abdominal hysterectomy    . Coronary angioplasty with stent placement  2002-2009  . Coronary angioplasty with stent placement  05/06/12    "1; this makes 5"  . Esophageal dilation  ~ 2012    Dr. Arlyce Dice    ROS:  Itching dry skin lesions. Otherwise as stated in the HPI and negative for all other systems.   PHYSICAL EXAM BP 134/72  Pulse 66  Ht 5\' 3"  (1.6 m)  Wt 175 lb 6.4 oz (79.561 kg)  BMI 31.08 kg/m2 GENERAL:  Well appearing HEENT:  Pupils equal round and reactive, fundi not visualized, oral mucosa unremarkable, dentures NECK:  No jugular  venous distention, waveform within normal limits, carotid upstroke brisk and symmetric, no bruits, no thyromegaly LYMPHATICS:  No cervical, inguinal adenopathy LUNGS:  Clear to auscultation bilaterally BACK:  No CVA tenderness CHEST:  Unremarkable HEART:  PMI not displaced or sustained,S1 and S2 within normal limits, no S3, no S4, no clicks, no rubs, no murmurs ABD:  Flat, positive bowel sounds normal in frequency in pitch, no bruits, no rebound, no guarding, no midline pulsatile mass, no hepatomegaly, no splenomegaly EXT:  2 plus pulses throughout, no edema, left groin without bruit or pulsatile mass.  EKG - Sinus rhythm, rate 66, axis within normal limits, intervals within normal limits, no acute ST-T wave changes.  07/02/2013   ASSESSMENT AND PLAN   CAD -  Is very difficult to sort her symptoms. They seem to be similar to symptoms she had at the time of her last cath at which point she had obstructive coronary disease. I'm going to screen her with a stress test. She wouldn't be a walk on a treadmill. Therefore, she will have a [employer].  Hyperlipemia -  We will call Dr. Hennie Duos office to get the last lipid profile.   HTN (hypertension) -  The blood pressure is at target. No change in medications is indicated. We will continue with therapeutic lifestyle changes (TLC).  TOBACCO ABUSE -  She understands that she needs to quit smoking completely. We discussed this again today.

## 2013-07-15 ENCOUNTER — Ambulatory Visit (HOSPITAL_COMMUNITY): Payer: Medicare Other | Attending: Cardiology | Admitting: Radiology

## 2013-07-15 VITALS — BP 108/60 | HR 58 | Ht 63.0 in | Wt 172.0 lb

## 2013-07-15 DIAGNOSIS — R0602 Shortness of breath: Secondary | ICD-10-CM | POA: Insufficient documentation

## 2013-07-15 DIAGNOSIS — I2510 Atherosclerotic heart disease of native coronary artery without angina pectoris: Secondary | ICD-10-CM

## 2013-07-15 DIAGNOSIS — R42 Dizziness and giddiness: Secondary | ICD-10-CM | POA: Insufficient documentation

## 2013-07-15 DIAGNOSIS — R079 Chest pain, unspecified: Secondary | ICD-10-CM | POA: Insufficient documentation

## 2013-07-15 DIAGNOSIS — R110 Nausea: Secondary | ICD-10-CM | POA: Insufficient documentation

## 2013-07-15 MED ORDER — TECHNETIUM TC 99M SESTAMIBI GENERIC - CARDIOLITE
11.0000 | Freq: Once | INTRAVENOUS | Status: AC | PRN
  Administered 2013-07-15: 11 via INTRAVENOUS

## 2013-07-15 MED ORDER — REGADENOSON 0.4 MG/5ML IV SOLN
0.4000 mg | Freq: Once | INTRAVENOUS | Status: AC
  Administered 2013-07-15: 0.4 mg via INTRAVENOUS

## 2013-07-15 MED ORDER — TECHNETIUM TC 99M SESTAMIBI GENERIC - CARDIOLITE
33.0000 | Freq: Once | INTRAVENOUS | Status: AC | PRN
  Administered 2013-07-15: 33 via INTRAVENOUS

## 2013-07-15 NOTE — Progress Notes (Signed)
[facility] SITE [address] NUCLEAR MED [address] [place], [place] [address] [phone]    Cardiology Nuclear Med Study  [name] is a 78 y.o. female     MRN : [id number]     DOB: Jun 23, 1935  Procedure Date: 07/15/2013  Nuclear Med Background Indication for Stress Test:  Evaluation for Ischemia and Stent Patency History:  '09 [employer]:[id number], EF=74%; Multiple Stents; 6/13 Stent-CFX Cardiac Risk Factors: Hypertension, Lipids and Smoker  Symptoms:  Chest Pain and Pressure.  (last episode of chest discomfort:chronic, now 7/10), Dizziness, SOB/DOE and Nausea    Nuclear Pre-Procedure Caffeine/Decaff Intake:  None NPO After: 8:00pm   Lungs:  Diminished.  Patient used her Albuterol inhaler on the way over here. O2 Sat: 93% on room air. IV 0.9% NS with Angio Cath:  22g  IV Site: R Antecubital  IV Started by:  Bonnita Levan, RN  Chest Size (in):  36 Cup Size: C  Height: 5\' 3"  (1.6 m)  Weight:  172 lb (78.019 kg)  BMI:  Body mass index is 30.48 kg/(m^2). Tech Comments:  All am med's taken    Nuclear Med Study 1 or 2 day study: 1 day  Stress Test Type:  Lexiscan  Reading MD: Olga Millers, MD  Order Authorizing Provider:  Rollene Rotunda, MD  Resting Radionuclide: Technetium 83m Sestamibi  Resting Radionuclide Dose: 11.0 mCi   Stress Radionuclide:  Technetium 64m Sestamibi  Stress Radionuclide Dose: 33.0 mCi           Stress Protocol Rest HR: 58 Stress HR: 75  Rest BP: 108/60 Stress BP: 136/58  Exercise Time (min): n/a METS: n/a   Predicted Max HR: 144 bpm % Max HR: 52.08 bpm Rate Pressure Product: 01027   Dose of Adenosine (mg):  n/a Dose of Lexiscan: 0.4 mg  Dose of Atropine (mg): n/a Dose of Dobutamine: n/a mcg/kg/min (at max HR)  Stress Test Technologist: Smiley Houseman, CMA-N  Nuclear Technologist:  Doyne Keel, CNMT     Rest Procedure:  Myocardial perfusion imaging was performed at rest 45 minutes following the intravenous administration of Technetium 26m  Sestamibi.  Rest ECG: Sinus rhythm with RVCD]  Stress Procedure:  The patient received IV Lexiscan 0.4 mg over 15-seconds.  Technetium 56m Sestamibi injected at 30-seconds.  She c/o chest tightness with Lexiscan.  She had a bronchospasm with Lexiscan, that was relieved with albuterol nebulizer treatment.   Quantitative spect images were obtained after a 45 minute delay.   Stress ECG: No significant ST segment change suggestive of ischemia.  QPS Raw Data Images:  Acquisition technically good; normal left ventricular size. Stress Images:  There is decreased uptake in the anterior wall. Rest Images:  There is decreased uptake in the anterior wall. Subtraction (SDS):  There is a fixed anteriour defect that is most consistent with breast attenuation. Transient Ischemic Dilatation (Normal <1.22):  n/a Lung/Heart Ratio (Normal <0.45):  0.40  Quantitative Gated Spect Images QGS EDV:  58 ml QGS ESV:  11 ml  Impression Exercise Capacity:  Lexiscan with no exercise. BP Response:  Normal blood pressure response. Clinical Symptoms:  There is chest tightness. ECG Impression:  No significant ST segment change suggestive of ischemia. Comparison with Prior Nuclear Study: No images to compare  Overall Impression:  Low risk stress nuclear study with a small, mild, fixed anterior defect suggestive of soft tissue attenuation; no ischemia.  LV Ejection Fraction: 82%.  LV Wall Motion:  NL LV Function; NL Wall Motion  [name]  [name]

## 2013-09-21 ENCOUNTER — Other Ambulatory Visit: Payer: Self-pay

## 2013-09-21 MED ORDER — ISOSORBIDE MONONITRATE ER 30 MG PO TB24: ORAL_TABLET | ORAL | Status: DC

## 2013-09-30 ENCOUNTER — Other Ambulatory Visit (HOSPITAL_COMMUNITY): Payer: Self-pay | Admitting: Family Medicine

## 2013-09-30 DIAGNOSIS — M25569 Pain in unspecified knee: Secondary | ICD-10-CM

## 2013-09-30 DIAGNOSIS — R269 Unspecified abnormalities of gait and mobility: Secondary | ICD-10-CM

## 2013-10-01 ENCOUNTER — Ambulatory Visit (HOSPITAL_COMMUNITY)
Admission: RE | Admit: 2013-10-01 | Discharge: 2013-10-01 | Disposition: A | Discharge status: Home / Self Care | Payer: Medicare Other | Source: Ambulatory Visit | Attending: Family Medicine | Admitting: Family Medicine

## 2013-10-01 DIAGNOSIS — I10 Essential (primary) hypertension: Secondary | ICD-10-CM | POA: Insufficient documentation

## 2013-10-01 DIAGNOSIS — R0989 Other specified symptoms and signs involving the circulatory and respiratory systems: Secondary | ICD-10-CM

## 2013-10-01 DIAGNOSIS — R269 Unspecified abnormalities of gait and mobility: Secondary | ICD-10-CM | POA: Insufficient documentation

## 2013-10-01 DIAGNOSIS — M25569 Pain in unspecified knee: Secondary | ICD-10-CM | POA: Insufficient documentation

## 2013-10-01 NOTE — Progress Notes (Signed)
VASCULAR LAB PRELIMINARY  ARTERIAL  ABI completed:    RIGHT    LEFT    PRESSURE WAVEFORM  PRESSURE WAVEFORM  BRACHIAL 140 triphasic BRACHIAL 139 triphasic  DP   DP    AT 111 biphasic AT 132 triphasic  PT 139 triphasic PT 145 triphasic  PER   PER    GREAT TOE  NA GREAT TOE  NA    RIGHT LEFT  ABI 0.99 >1.0     Layna Roeper, RVT 10/01/2013, 11:43 AM

## 2013-10-15 DIAGNOSIS — C44320 Squamous cell carcinoma of skin of unspecified parts of face: Secondary | ICD-10-CM | POA: Insufficient documentation

## 2013-10-15 HISTORY — PX: SKIN BIOPSY: SHX1

## 2014-01-04 ENCOUNTER — Encounter: Payer: Self-pay | Admitting: Radiation Oncology

## 2014-01-04 DIAGNOSIS — C44301 Unspecified malignant neoplasm of skin of nose: Secondary | ICD-10-CM | POA: Insufficient documentation

## 2014-01-05 ENCOUNTER — Encounter: Payer: Self-pay | Admitting: Radiation Oncology

## 2014-01-05 NOTE — Progress Notes (Signed)
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 no  Pacemaker/ICD? no  Possible current pregnancy? no  Is the patient on methotrexate? no  Current Complaints / other details:  Retired, mother had abnormal moles

## 2014-01-06 ENCOUNTER — Ambulatory Visit
Admission: RE | Admit: 2014-01-06 | Discharge: 2014-01-06 | Disposition: A | Discharge status: Home / Self Care | Payer: Medicare Other | Source: Ambulatory Visit | Attending: Radiation Oncology | Admitting: Radiation Oncology

## 2014-01-06 ENCOUNTER — Ambulatory Visit: Payer: Medicare Other

## 2014-01-06 DIAGNOSIS — C44301 Unspecified malignant neoplasm of skin of nose: Secondary | ICD-10-CM

## 2014-01-06 HISTORY — DX: Squamous cell carcinoma of skin of nose: C44.321

## 2014-01-06 HISTORY — DX: Other fatigue: R53.83

## 2014-01-06 HISTORY — DX: Other visual disturbances: H53.8

## 2014-01-11 NOTE — Progress Notes (Signed)
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 no  Pacemaker/ICD? no  Possible current pregnancy? no  Is the patient on methotrexate? No  Current Complaints / other details: Retired, mother had abnormal moles.  Daughter states pt "did NOT have Moh's on her nose due to chest pain that day. Dr did not want to perform surgery."  Pt states she is nervous today. Daughter states she "has panic attacks".

## 2014-01-12 ENCOUNTER — Ambulatory Visit
Admission: RE | Admit: 2014-01-12 | Discharge: 2014-01-12 | Disposition: A | Discharge status: Home / Self Care | Payer: Medicare Other | Source: Ambulatory Visit | Attending: Radiation Oncology | Admitting: Radiation Oncology

## 2014-01-12 ENCOUNTER — Encounter: Payer: Self-pay | Admitting: Radiation Oncology

## 2014-01-12 VITALS — BP 164/76 | HR 63 | Temp 97.5°F | Resp 20 | Wt 178.5 lb

## 2014-01-12 DIAGNOSIS — E785 Hyperlipidemia, unspecified: Secondary | ICD-10-CM | POA: Insufficient documentation

## 2014-01-12 DIAGNOSIS — I2510 Atherosclerotic heart disease of native coronary artery without angina pectoris: Secondary | ICD-10-CM | POA: Insufficient documentation

## 2014-01-12 DIAGNOSIS — K219 Gastro-esophageal reflux disease without esophagitis: Secondary | ICD-10-CM | POA: Insufficient documentation

## 2014-01-12 DIAGNOSIS — C44301 Unspecified malignant neoplasm of skin of nose: Secondary | ICD-10-CM

## 2014-01-12 DIAGNOSIS — Z8249 Family history of ischemic heart disease and other diseases of the circulatory system: Secondary | ICD-10-CM | POA: Insufficient documentation

## 2014-01-12 DIAGNOSIS — Z79899 Other long term (current) drug therapy: Secondary | ICD-10-CM | POA: Insufficient documentation

## 2014-01-12 DIAGNOSIS — Z808 Family history of malignant neoplasm of other organs or systems: Secondary | ICD-10-CM | POA: Insufficient documentation

## 2014-01-12 DIAGNOSIS — C44321 Squamous cell carcinoma of skin of nose: Secondary | ICD-10-CM

## 2014-01-12 DIAGNOSIS — C44320 Squamous cell carcinoma of skin of unspecified parts of face: Secondary | ICD-10-CM | POA: Insufficient documentation

## 2014-01-12 DIAGNOSIS — Z83511 Family history of glaucoma: Secondary | ICD-10-CM | POA: Insufficient documentation

## 2014-01-12 DIAGNOSIS — J449 Chronic obstructive pulmonary disease, unspecified: Secondary | ICD-10-CM | POA: Insufficient documentation

## 2014-01-12 DIAGNOSIS — F17200 Nicotine dependence, unspecified, uncomplicated: Secondary | ICD-10-CM | POA: Insufficient documentation

## 2014-01-12 DIAGNOSIS — Z9861 Coronary angioplasty status: Secondary | ICD-10-CM | POA: Insufficient documentation

## 2014-01-12 DIAGNOSIS — J4489 Other specified chronic obstructive pulmonary disease: Secondary | ICD-10-CM | POA: Insufficient documentation

## 2014-01-12 DIAGNOSIS — I10 Essential (primary) hypertension: Secondary | ICD-10-CM | POA: Insufficient documentation

## 2014-01-12 NOTE — Progress Notes (Signed)
Please see the Nurse Progress Note in the MD Initial Consult Encounter for this patient. 

## 2014-01-12 NOTE — Progress Notes (Signed)
[facility] NEW PATIENT EVALUATION  Name: [name] MRN: [id number]  Date:   01/12/2014           DOB: [birth year]  Status: outpatient   CC: Leonard Downing, MD  Izora Ribas, MD    REFERRING PHYSICIAN: Izora Ribas, MD   DIAGNOSIS: Squamous cell carcinoma of the right nose   HISTORY OF PRESENT ILLNESS:  [name] is a 79 y.o. female who is seen today for the courtesy of Dr. Elly Modena for consideration of radiation therapy in the management of her biopsy-proven squamous cell carcinoma of the right aspect of the nose. The patient is a poor historian and it is unclear as to how long she had a lesion along the right aspect of her nose. She was seen by Dr. Harvel Quale who noted a 2.0 x 1.4 cm atrophic scar with marginal hyperkeratosis seen along the right ala of the nose. This was biopsied on 10/15/2013 and found to  be diagnostic for squamous cell carcinoma. She was scheduled for Mohs surgery but apparently had an anxiety attack with chest pain and surgery canceled. She is without complaints today.  PREVIOUS RADIATION THERAPY: No   PAST MEDICAL HISTORY:  has a past medical history of Hyperlipemia; Unspecified essential hypertension; GERD (gastroesophageal reflux disease); CAD (coronary artery disease); COPD (chronic obstructive pulmonary disease); Anginal pain; Hard of hearing; Arthritis; Squamous cell carcinoma of nose (10/15/13); Fatigue; Night sweats; SOB (shortness of breath); Joint pain; Headache; Blurred vision; and Skin cancer of nose (09/2813).     PAST SURGICAL HISTORY:  Past Surgical History  Procedure Laterality Date  . Lumbar spine surgery  04/2011    Rods, screws and cages  . Skin biopsy  10/15/13    right nose-microinvasive squamous cell carcinoma  . Abdominal hysterectomy    . Coronary angioplasty with stent placement  2002-2009    x 5  . Coronary angioplasty with stent placement  05/06/12    "1; this makes 5"  . Esophageal  dilation  ~ 2012    Dr. Deatra Ina     FAMILY HISTORY: family history includes Bone cancer in her mother; Glaucoma in her father; Heart attack in her father. Her father died of a heart attack at 29 and her mother died from "bone cancer" at 18.   SOCIAL HISTORY:  reports that she has been smoking Cigarettes.  She has a 27.5 pack-year smoking history. She has quit using smokeless tobacco. Her smokeless tobacco use included Snuff. She reports that she does not drink alcohol or use illicit drugs. Widowed for the past 20 years, 4 children. 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 She also worked in a Clinical cytogeneticist.   ALLERGIES: Iohexol and Lipitor   MEDICATIONS:  Current Outpatient Prescriptions  Medication Sig Dispense Refill  . albuterol (PROVENTIL HFA;VENTOLIN HFA) 108 (90 BASE) MCG/ACT inhaler Inhale 2 puffs into the lungs every 4 (four) hours as needed for wheezing.  1 Inhaler  2  . cholecalciferol (VITAMIN D) 1000 UNITS tablet Take 1,000 Units by mouth daily.       Marland Kitchen guaiFENesin (MUCINEX) 600 MG 12 hr tablet Take 1 tablet (600 mg total) by mouth 2 (two) times daily.  14 tablet  0  . hydrochlorothiazide (HYDRODIURIL) 25 MG tablet Take 25 mg by mouth daily.       . isosorbide mononitrate (IMDUR) 30 MG 24 hr tablet TAKE ONE TABLET BY MOUTH EVERY DAY  30  tablet  10  . nitroGLYCERIN (NITROSTAT) 0.4 MG SL tablet Place 1 tablet (0.4 mg total) under the tongue every 5 (five) minutes as needed for chest pain.  25 tablet  5  . propranolol (INDERAL) 40 MG tablet Take 20 mg by mouth 2 (two) times daily.       . psyllium (METAMUCIL SMOOTH TEXTURE) 28 % packet Take 1 packet by mouth 2 (two) times daily.      . ranitidine (ZANTAC) 300 MG tablet Take 300 mg by mouth daily.      Marland Kitchen tiotropium (SPIRIVA) 18 MCG inhalation capsule Place 1 capsule (18 mcg total) into inhaler and inhale daily.  30 capsule  1  . [DISCONTINUED] calcium carbonate (OS-CAL) 600 MG TABS Take 600 mg by mouth daily.         . [DISCONTINUED] pravastatin (PRAVACHOL) 40 MG tablet Take 40 mg by mouth daily.         No current facility-administered medications for this encounter.     REVIEW OF SYSTEMS:  Pertinent items are noted in HPI.    PHYSICAL EXAM:  weight is 178 lb 8 oz (80.967 kg). Her oral temperature is 97.5 F (36.4 C). Her blood pressure is 164/76 and her pulse is 63. Her respiration is 20.   Hard of hearing 79 year old white female appearing her stated age. Head and neck examination: There is a superficial scarlike defect along the right nose centered just above the right nasal ala. This measures 2.0 x 1.4 cm. Nodes: There is no palpable facial, periauricular, submandibular, or cervical lymphadenopathy.   LABORATORY DATA:  Lab Results  Component Value Date   WBC 9.4 05/07/2013   HGB 15.8* 05/07/2013   HCT 45.6 05/07/2013   MCV 92.9 05/07/2013   PLT 273 05/07/2013   Lab Results  Component Value Date   NA 134* 05/07/2013   K 4.0 05/07/2013   CL 96 05/07/2013   CO2 27 05/07/2013   Lab Results  Component Value Date   ALT 16 05/07/2013   AST 15 05/07/2013   ALKPHOS 51 05/07/2013   BILITOT 0.3 05/07/2013      IMPRESSION: Squamous cell carcinoma the skin involving the right nose. She is an excellent candidate for electron beam radiation therapy. NCCN guidelines recommend delivering approximately 5500 cGy in 20 sessions. It transportation is a problem, we could compromise by giving her 4400 cGy in 10 fractions, giving 2 fractions a week over 5 weeks. We discussed the potential acute and late toxicities of radiation therapy which should be well tolerated. Consent is signed today.   PLAN: She will return for simulation/treatment planning next week.  I spent 30 minutes minutes face to face with the patient and more than 50% of that time was spent in counseling and/or coordination of care.

## 2014-01-27 NOTE — Progress Notes (Signed)
[place] Psychosocial Distress Screening Clinical Social Work  Clinical Social Work was referred by distress screening protocol.  The patient scored a 7 on the Psychosocial Distress Thermometer which indicates moderate distress. Clinical Social Worker Intern telephoned to assess for distress and other psychosocial needs. Patient did not answer phone.  Voice mail was left to return call if needed.   Clinical Social Worker follow up needed: no  If yes, follow up plan:   Ardelle Haliburton S. Hailey Work Intern [employer] [phone]

## 2014-01-31 ENCOUNTER — Ambulatory Visit
Admission: RE | Admit: 2014-01-31 | Discharge: 2014-01-31 | Disposition: A | Discharge status: Home / Self Care | Payer: Medicare Other | Source: Ambulatory Visit | Attending: Radiation Oncology | Admitting: Radiation Oncology

## 2014-01-31 DIAGNOSIS — L589 Radiodermatitis, unspecified: Secondary | ICD-10-CM | POA: Insufficient documentation

## 2014-01-31 DIAGNOSIS — C44320 Squamous cell carcinoma of skin of unspecified parts of face: Secondary | ICD-10-CM | POA: Insufficient documentation

## 2014-01-31 DIAGNOSIS — Z510 Encounter for antineoplastic radiation therapy: Secondary | ICD-10-CM | POA: Insufficient documentation

## 2014-01-31 DIAGNOSIS — Y842 Radiological procedure and radiotherapy as the cause of abnormal reaction of the patient, or of later complication, without mention of misadventure at the time of the procedure: Secondary | ICD-10-CM | POA: Insufficient documentation

## 2014-01-31 DIAGNOSIS — J3481 Nasal mucositis (ulcerative): Secondary | ICD-10-CM | POA: Insufficient documentation

## 2014-01-31 NOTE — Progress Notes (Signed)
Complex simulation/treatment planning note: A head cast was constructed for immobilization. The nose was cut out. I outlined the carcinoma and expanded the carcinoma by approximately 1.2 cm for construction of a custom block. A total of 2 complex treatment devices are employed. The patient was set up RAO with a table kick. I'm prescribing 4500 cGy in 10 sessions, treating twice a week, each Monday and Thursday. On the first day of her treatment she'll have construction of bolus (question Vaseline gauze) to maximize the dose to the skin surface. A special port plan is requested.

## 2014-02-01 ENCOUNTER — Encounter: Payer: Self-pay | Admitting: Radiation Oncology

## 2014-02-01 NOTE — Progress Notes (Signed)
Chart note: The patient completed her electron beam planning today for treatment to her right nose. One custom block is constructed to conform the field. A special port plan was requested and reviewed. I'm prescribing 4500 cGy in 10 fractions, treating twice a week for 5 weeks lysing 6 MEV electrons.[name] also have construction of bolus on the first day of treatment next Monday, March 23. I will place that shielding along the inferior aspect of her nose to shield her upper lip.

## 2014-02-07 ENCOUNTER — Ambulatory Visit
Admission: RE | Admit: 2014-02-07 | Discharge: 2014-02-07 | Disposition: A | Discharge status: Home / Self Care | Payer: Medicare Other | Source: Ambulatory Visit | Attending: Radiation Oncology | Admitting: Radiation Oncology

## 2014-02-07 ENCOUNTER — Encounter: Payer: Self-pay | Admitting: Radiation Oncology

## 2014-02-07 VITALS — BP 138/88 | HR 66 | Temp 97.3°F | Ht 63.0 in | Wt 177.3 lb

## 2014-02-07 DIAGNOSIS — C44320 Squamous cell carcinoma of skin of unspecified parts of face: Secondary | ICD-10-CM

## 2014-02-07 NOTE — Progress Notes (Signed)
Weekly Management Note:  Site: Right nose Current Dose:  450  cGy Projected Dose: 4500  cGy  Narrative: The patient is seen today for routine under treatment assessment. CBCT/MVCT images/port films were reviewed. The chart was reviewed.   Her setup is excellent. No complaints today.  Physical Examination:  Filed Vitals:   02/07/14 1816  BP: 138/88  Pulse: 66  Temp: 97.3 F (36.3 C)  .  Weight: 177 lb 4.8 oz (80.423 kg). There is a scar like depression along her right nose identifying her carcinoma.  Impression: Tolerating radiation therapy well.  Plan: Continue radiation therapy as planned.

## 2014-02-07 NOTE — Progress Notes (Signed)
[name] has received 1 treatment to her right nares.  No c/o pain presently.

## 2014-02-10 ENCOUNTER — Ambulatory Visit
Admission: RE | Admit: 2014-02-10 | Discharge: 2014-02-10 | Disposition: A | Discharge status: Home / Self Care | Payer: Medicare Other | Source: Ambulatory Visit | Attending: Radiation Oncology | Admitting: Radiation Oncology

## 2014-02-11 ENCOUNTER — Encounter: Payer: Self-pay | Admitting: *Deleted

## 2014-02-11 NOTE — Progress Notes (Signed)
Late Entry:  On 02/11/14 Education completed regarding skin care of left nares regarding use of Biafine instead of her usual lotion, keeping the area clean and reporting if she has pain or issues with breathing out of her right nare.  She was informed that Dr. Valere Dross will see her each Monday following treatment  She will receive 10 fractions total, 2 treatments weekly on Monday and Thursday.

## 2014-02-14 ENCOUNTER — Ambulatory Visit
Admission: RE | Admit: 2014-02-14 | Discharge: 2014-02-14 | Disposition: A | Discharge status: Home / Self Care | Payer: Medicare Other | Source: Ambulatory Visit | Attending: Radiation Oncology | Admitting: Radiation Oncology

## 2014-02-14 VITALS — BP 164/90 | HR 69 | Temp 97.5°F | Ht 63.0 in | Wt 175.6 lb

## 2014-02-14 DIAGNOSIS — C44301 Unspecified malignant neoplasm of skin of nose: Secondary | ICD-10-CM

## 2014-02-14 NOTE — Progress Notes (Signed)
   Weekly Management Note:  outpatient Current Dose:  13.5 Gy  Projected Dose: 45 Gy   Narrative:  The patient presents for routine under treatment assessment.  CBCT/MVCT images/Port film x-rays were reviewed.  The chart was checked. Some itching over nose. Using Biafine.  Physical Findings:  height is 5\' 3"  (1.6 m) and weight is 175 lb 9.6 oz (79.652 kg). Her temperature is 97.5 F (36.4 C). Her blood pressure is 164/90 and her pulse is 69.  slightly ulcerated lesion of right nose.  No bleeding, no moist peeling.  Impression:  The patient is tolerating radiotherapy.  Plan:  Continue radiotherapy as planned.  I approved her set up today at the [facility].  ________________________________   Eppie Gibson, M.D.

## 2014-02-14 NOTE — Progress Notes (Signed)
[name] has had 3 fractions to her right nose.  [name] denies pain but does have a sore throat today.  [name] also reports occasional pain and itching across her nose.  [name] denies fatigue.  The skin on her right nose is peeling.  [name] is using biafine.

## 2014-02-16 ENCOUNTER — Ambulatory Visit
Admission: RE | Admit: 2014-02-16 | Discharge: 2014-02-16 | Disposition: A | Discharge status: Home / Self Care | Payer: Medicare Other | Source: Ambulatory Visit | Attending: Radiation Oncology | Admitting: Radiation Oncology

## 2014-02-17 ENCOUNTER — Ambulatory Visit: Payer: Medicare Other

## 2014-02-21 ENCOUNTER — Ambulatory Visit
Admission: RE | Admit: 2014-02-21 | Discharge: 2014-02-21 | Disposition: A | Discharge status: Home / Self Care | Payer: Medicare Other | Source: Ambulatory Visit | Attending: Radiation Oncology | Admitting: Radiation Oncology

## 2014-02-21 DIAGNOSIS — C44320 Squamous cell carcinoma of skin of unspecified parts of face: Secondary | ICD-10-CM

## 2014-02-21 NOTE — Progress Notes (Signed)
Weekly Management Note:  Site: Right nose Current Dose:  2250   cGy Projected Dose: 4500  cGy  Narrative: The patient is seen today for routine under treatment assessment. CBCT/MVCT images/port films were reviewed. The chart was reviewed.   She is without new complaints today. She uses Biafine cream.  Physical Examination: There were no vitals filed for this visit..  Weight:  . There is slight scabbing along the anterior aspect of her tumor bed. There is mild to moderate erythema. No desquamation.  Impression: Tolerating radiation therapy well.  Plan: Continue radiation therapy as planned.

## 2014-02-24 ENCOUNTER — Ambulatory Visit
Admission: RE | Admit: 2014-02-24 | Discharge: 2014-02-24 | Disposition: A | Discharge status: Home / Self Care | Payer: Medicare Other | Source: Ambulatory Visit | Attending: Radiation Oncology | Admitting: Radiation Oncology

## 2014-02-28 ENCOUNTER — Ambulatory Visit: Payer: Medicare Other

## 2014-02-28 ENCOUNTER — Ambulatory Visit: Payer: Medicare Other | Admitting: Radiation Oncology

## 2014-03-01 ENCOUNTER — Encounter: Payer: Self-pay | Admitting: Radiation Oncology

## 2014-03-01 ENCOUNTER — Ambulatory Visit
Admission: RE | Admit: 2014-03-01 | Discharge: 2014-03-01 | Disposition: A | Discharge status: Home / Self Care | Payer: Medicare Other | Source: Ambulatory Visit | Attending: Radiation Oncology | Admitting: Radiation Oncology

## 2014-03-01 ENCOUNTER — Ambulatory Visit: Payer: Medicare Other | Admitting: Radiation Oncology

## 2014-03-01 VITALS — BP 131/72 | HR 95 | Resp 16 | Wt 174.9 lb

## 2014-03-01 DIAGNOSIS — C44320 Squamous cell carcinoma of skin of unspecified parts of face: Secondary | ICD-10-CM

## 2014-03-01 NOTE — Progress Notes (Signed)
Hyperpigmentation without desquamation of nose noted. Reports its difficult to breath because of dried blood in right nostril. Reports using warm saline rinses regularly. Reports using biafine as directed on affect skin. Reports pain across bridge of nose that she is unable to describe or rate. Reports taking tramadol every four hours to manage this pain. Vital stable. Reports fatigue.

## 2014-03-01 NOTE — Progress Notes (Signed)
Weekly Management Note:  Site: Right nose Current Dose:  3150  cGy Projected Dose: 4500  cGy  Narrative: The patient is seen today for routine under treatment assessment. CBCT/MVCT images/port films were reviewed. The chart was reviewed.   She is bothered today by difficulty breathing through her night nostril because of crusting and bleeding precipitated by picking her nose. She is using saline rinses.  Physical Examination:  Filed Vitals:   03/01/14 1753  BP: 131/72  Pulse: 95  Resp: 16  .  Weight: 174 lb 14.4 oz (79.334 kg). There is dark crusting along the nostril with decreased air flow through the right nostril. There is marked erythema along the right nose as expected. The carcinoma is well-defined.  Impression: Tolerating radiation therapy well with radiation dermatitis and mucositis within the right nostril as expected. I gave her Aquaphor to use when necessary and she was encouraged to use saline nasal spray. She is to avoid picking her nose.  Plan: Continue radiation therapy as planned.

## 2014-03-03 ENCOUNTER — Encounter: Payer: Medicare Other | Admitting: Radiation Oncology

## 2014-03-03 ENCOUNTER — Ambulatory Visit
Admission: RE | Admit: 2014-03-03 | Discharge: 2014-03-03 | Disposition: A | Discharge status: Home / Self Care | Payer: Medicare Other | Source: Ambulatory Visit | Attending: Radiation Oncology | Admitting: Radiation Oncology

## 2014-03-07 ENCOUNTER — Ambulatory Visit
Admission: RE | Admit: 2014-03-07 | Discharge: 2014-03-07 | Disposition: A | Discharge status: Home / Self Care | Payer: Medicare Other | Source: Ambulatory Visit | Attending: Radiation Oncology | Admitting: Radiation Oncology

## 2014-03-07 VITALS — BP 176/74 | HR 76 | Temp 97.5°F | Ht 63.0 in | Wt 174.9 lb

## 2014-03-07 DIAGNOSIS — C44320 Squamous cell carcinoma of skin of unspecified parts of face: Secondary | ICD-10-CM

## 2014-03-07 DIAGNOSIS — C44301 Unspecified malignant neoplasm of skin of nose: Secondary | ICD-10-CM

## 2014-03-07 MED ORDER — BIAFINE EX EMUL
Freq: Two times a day (BID) | CUTANEOUS | Status: DC
  Administered 2014-03-07: 18:00:00 via TOPICAL

## 2014-03-07 NOTE — Progress Notes (Signed)
Weekly Management Note:  Site: Right nose Current Dose:  4050  cGy Projected Dose: 4500  cGy  Narrative: The patient is seen today for routine under treatment assessment. CBCT/MVCT images/port films were reviewed. The chart was reviewed.   She is without new complaints today. She still has discomfort along her right nostril with some difficulty with airflow through her right nostril. She is using nasal saline as recommended. She has not had any nasal bleeding.  Physical Examination:  Filed Vitals:   03/07/14 1749  BP: 176/74  Pulse: 76  Temp: 97.5 F (36.4 C)  .  Weight: 174 lb 14.4 oz (79.334 kg). There is marked erythema along the right aspect of the nose and there is some crusting within the right nostril. There is no desquamation.  Impression: Tolerating radiation therapy well. She will finish her radiation therapy this Thursday which will be her final fraction.  Plan: Continue radiation therapy as planned.

## 2014-03-07 NOTE — Progress Notes (Signed)
[name] has had 9 fractions to her right nose.  She reports pain 10/10 in her lower back from prior surgeries and arthritis.  She reports that her eyes are dry.  The skin on her nose is red.  She is using biafine cream and asked for a refill.  Another tube has been given.  She reports fatigue.  Her bp is elevated today at 176/74.

## 2014-03-10 ENCOUNTER — Ambulatory Visit
Admission: RE | Admit: 2014-03-10 | Discharge: 2014-03-10 | Disposition: A | Discharge status: Home / Self Care | Payer: Medicare Other | Source: Ambulatory Visit | Attending: Radiation Oncology | Admitting: Radiation Oncology

## 2014-03-10 ENCOUNTER — Encounter: Payer: Self-pay | Admitting: Radiation Oncology

## 2014-03-10 NOTE — Progress Notes (Signed)
Weekly Management Note:  Site: Right nose Current Dose:  4500  cGy Projected Dose: 4500  cGy  Narrative: The patient is seen today for routine under treatment assessment. CBCT/MVCT images/port films were reviewed. The chart was reviewed.   She is without new complaints today. She did have difficulty moving air through her right nostril.  Physical Examination: There were no vitals filed for this visit..  Weight:  . There is marked erythema of her right nose with impending desquamation. There is slight hemorrhagic crusting within the right nostril.  Impression: Radiation therapy completed.  Plan: Followup visit in one week.

## 2014-03-10 NOTE — Progress Notes (Signed)
[facility] End of Treatment Note  Name:[name]  Date: 03/10/2014 TMA:[id number] DOB:Dec 13, 1934   Status:outpatient    CC: Leonard Downing, MD,  Dr. Izora Ribas  REFERRING PHYSICIAN:   Dr. Izora Ribas   DIAGNOSIS: Squamous cell carcinoma the skin, right nose   INDICATION FOR TREATMENT: Curative   TREATMENT DATES: 02/07/2014 through 03/10/2014                          SITE/DOSE: Right nose 4500 cGy in 10 sessions over 5 weeks (2 treatments a week)                           BEAMS/ENERGY:   6 MEV electrons with 1.0 cm bolus applied daily to maximize the dose to the skin surface.              NARRATIVE:  The patient tolerated treatment well although she had irritation of her nostril with slight hemorrhagic crusting during her last week of therapy. She was instructed to use nasal saline spray.                          PLAN: Followup visit in one week. Patient instructed to call if questions or worsening complaints in interim.

## 2014-03-15 ENCOUNTER — Ambulatory Visit: Payer: Medicare Other

## 2014-03-16 ENCOUNTER — Encounter: Payer: Self-pay | Admitting: *Deleted

## 2014-03-17 ENCOUNTER — Ambulatory Visit
Admission: RE | Admit: 2014-03-17 | Discharge: 2014-03-17 | Disposition: A | Discharge status: Home / Self Care | Payer: Medicare Other | Source: Ambulatory Visit | Attending: Radiation Oncology | Admitting: Radiation Oncology

## 2014-03-17 VITALS — BP 141/77 | HR 66 | Temp 97.4°F | Ht 63.0 in | Wt 176.3 lb

## 2014-03-17 DIAGNOSIS — C44320 Squamous cell carcinoma of skin of unspecified parts of face: Secondary | ICD-10-CM

## 2014-03-17 NOTE — Progress Notes (Signed)
CC: Dr. Elly Modena   Followup note:  The patient returns today approximately one week following completion of electron beam radiation therapy in the management of her squamous cell carcinoma the skin, right nose. She is without new complaints today. She uses a nasal saline spray twice a day. She does have some residual discomfort along her right nose, presumably from radiation dermatitis. She uses Radioplex gel on her skin.  Physical examination: There is residual marked erythema along the right nose. The carcinoma is well-defined central  depression and slightly raised edges. There is no ulceration. The carcinoma measures approximately 2.0 x 1.5 cm. On inspection the nostril, there is minimal hemorrhagic crusting. The nostril is not obstructed.  Impression: Satisfactory progress. There is persistent disease as expected. This should regress over the next couple months.  Plan: Followup visit here in 2 months.

## 2014-03-17 NOTE — Progress Notes (Signed)
[name] here for follow up after treatment to her right nose.  She reports some burning on the right side of her nose when applying biafine lotion.  She reports that her right mare is closed up in the mornings and she uses saline nasal spray to open it up.  The skin on her nose is red.  She has a small red area that she said pealed on the right side of the bridge of her nose.  She reports using another lotion (aquaphor?) that she was given in the clinic but does not remember the name of it.

## 2014-04-19 ENCOUNTER — Ambulatory Visit: Payer: Medicare Other | Admitting: Radiation Oncology

## 2014-05-24 ENCOUNTER — Encounter: Payer: Self-pay | Admitting: Radiation Oncology

## 2014-05-24 ENCOUNTER — Ambulatory Visit
Admission: RE | Admit: 2014-05-24 | Discharge: 2014-05-24 | Disposition: A | Discharge status: Home / Self Care | Payer: Medicare Other | Source: Ambulatory Visit | Attending: Radiation Oncology | Admitting: Radiation Oncology

## 2014-05-24 VITALS — BP 175/85 | HR 63 | Resp 16 | Wt 176.9 lb

## 2014-05-24 DIAGNOSIS — C44320 Squamous cell carcinoma of skin of unspecified parts of face: Secondary | ICD-10-CM

## 2014-05-24 NOTE — Progress Notes (Signed)
CC: Dr. Izora Ribas, Dr. Claris Gower  Followup note:  [name] visits today approximately 2-1/2 months following completion of electron beam radiotherapy in the management of her squamous cell carcinoma of the skin involving the right nose. She is without complaints today. She is pleased with her cosmesis. She does not have an appointment to see Dr. Harvel Quale.  Physical examination: There is faint depression along the right nose with no obvious residual carcinoma. I do not see any obvious new carcinomas along the face although there may be an actinic keratosis along her left zygoma region. Nodes: There is no palpable facial, periauricular, or submandibular lymphadenopathy.  Impression: Satisfactory progress. I believe that she's had a complete response.  Plan: Followup visit with me in 4 months. She'll make contact with [facility] for general dermatologic surveillance/care.

## 2014-05-24 NOTE — Progress Notes (Signed)
Patient reports that she had a cold last week. She explains when she blew her nose it was uncomfortable on the right side but, denies any pain/discomfort since. Skin of right nose has returned to normal color and appearance without depression or raised edges. There is no ulceration. Patient denies using daily nasal spray. Reports she continues to use Biafine and what she believe to be Aquaphor. Weight and vitals stable. Denies headache, dizziness, nausea, vomiting, or diarrhea. Reports chronic lumbar spine pain related to effects of surgery. Scheduled to follow up with heart doctor in August.

## 2014-06-26 ENCOUNTER — Other Ambulatory Visit: Payer: Self-pay | Admitting: Cardiology

## 2014-06-28 ENCOUNTER — Other Ambulatory Visit: Payer: Self-pay | Admitting: Cardiology

## 2014-07-04 ENCOUNTER — Encounter: Payer: Self-pay | Admitting: Cardiology

## 2014-07-04 ENCOUNTER — Ambulatory Visit (INDEPENDENT_AMBULATORY_CARE_PROVIDER_SITE_OTHER): Payer: Medicare Other | Admitting: Cardiology

## 2014-07-04 VITALS — BP 146/80 | HR 56 | Ht 65.0 in | Wt 177.0 lb

## 2014-07-04 DIAGNOSIS — I2510 Atherosclerotic heart disease of native coronary artery without angina pectoris: Secondary | ICD-10-CM

## 2014-07-04 NOTE — Patient Instructions (Signed)
Your physician recommends that you schedule a follow-up appointment in: one year with Dr. Hochrein  

## 2014-07-04 NOTE — Progress Notes (Signed)
HP The patient presents for follow up of CAD. She's had a history of coronary disease with multiple stents.  She presents for followup she has had continued chest and arm discomfort. She has diffuse muscle aches and pains and somatic complaints. It is somewhat difficult to sort this out from the angina that she's had in the past. She does describes substernal pressure and occasional left arm discomfort. However, it does not seem that this is different than discomfort she had in September of last year when she had a stress test with no ischemiaShe has chronic shortness of breath but she's not describing any new PND or orthopnea. She's had no presyncope or syncope.  Allergies  Allergen Reactions  . Iohexol      Code: HIVES, Desc: PER ROBIN @ [facility], PT IS ALLERGIC TO IVP DYE 08/28/10/RM, Onset Date: [id number]   . Lipitor [Atorvastatin Calcium] Hives    Hives per pt. She states she thinks this is the right medicine    Current Outpatient Prescriptions  Medication Sig Dispense Refill  . albuterol (PROVENTIL HFA;VENTOLIN HFA) 108 (90 BASE) MCG/ACT inhaler Inhale 2 puffs into the lungs every 4 (four) hours as needed for wheezing.  1 Inhaler  2  . cholecalciferol (VITAMIN D) 1000 UNITS tablet Take 1,000 Units by mouth daily.       Marland Kitchen guaiFENesin (MUCINEX) 600 MG 12 hr tablet Take 1 tablet (600 mg total) by mouth 2 (two) times daily.  14 tablet  0  . hydrochlorothiazide (HYDRODIURIL) 25 MG tablet Take 25 mg by mouth daily.       . isosorbide mononitrate (IMDUR) 30 MG 24 hr tablet TAKE ONE TABLET BY MOUTH EVERY DAY  30 tablet  10  . NITROSTAT 0.4 MG SL tablet DISSOLVE ONE TABLET UNDER THE TONGUE EVERY 5 MINUTES AS NEEDED FOR CHEST PAIN.  DO NOT EXCEED A TOTAL OF 3 DOSES IN 15 MINUTES  25 tablet  0  . propranolol (INDERAL) 40 MG tablet Take 20 mg by mouth 2 (two) times daily.       . psyllium (METAMUCIL SMOOTH TEXTURE) 28 % packet Take 1 packet by mouth 2 (two) times daily.      . ranitidine (ZANTAC) 300 MG  tablet       . sodium chloride (OCEAN) 0.65 % SOLN nasal spray Place 1 spray into both nostrils as needed for congestion.      Marland Kitchen tiotropium (SPIRIVA) 18 MCG inhalation capsule Place 1 capsule (18 mcg total) into inhaler and inhale daily.  30 capsule  1  . traMADol (ULTRAM) 50 MG tablet Take 50 mg by mouth every 6 (six) hours as needed.       . [DISCONTINUED] calcium carbonate (OS-CAL) 600 MG TABS Take 600 mg by mouth daily.        . [DISCONTINUED] pravastatin (PRAVACHOL) 40 MG tablet Take 40 mg by mouth daily.         No current facility-administered medications for this visit.    Past Medical History  Diagnosis Date  . Hyperlipemia   . Unspecified essential hypertension   . GERD (gastroesophageal reflux disease)   . CAD (coronary artery disease)     Multiple percutaneous revascularization. Catheterization May 2013 left main normal, patent LAD stent, 90% circumflex stenosis, patent right coronary artery stents. Patient had a DES to the circumflex. The EF was well-preserved.  Marland Kitchen COPD (chronic obstructive pulmonary disease)   . Anginal pain   . Hard of hearing   . Arthritis     "  aching bones sometimes"  . 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 Fatigue   . Night sweats   . SOB (shortness of breath)     with exertion  . Joint pain   . Headache   . Blurred vision   . Skin cancer of nose 09/2813    right inferior bridge of nose, microinvasive squamous cell  . Hx of radiation therapy 02/07/14- 03/10/14    right nose 4500 cGy in 10 sessions over 5 weeks (2 treatments a week)    Past Surgical History  Procedure Laterality Date  . Lumbar spine surgery  04/2011    Rods, screws and cages  . Skin biopsy  10/15/13    right nose-microinvasive squamous cell carcinoma  . Abdominal hysterectomy    . Coronary angioplasty with stent placement  2002-2009    x 5  . Coronary angioplasty with stent placement  05/06/12    "1; this makes 5"  . Esophageal dilation  ~ 2012    Dr. Deatra Ina     ROS: As stated in the HPI and negative for all other systems.   PHYSICAL EXAM BP 146/80  Pulse 56  Ht 5\' 5"  (1.651 m)  Wt 177 lb (80.287 kg)  BMI 29.45 kg/m2 GENERAL:  Well appearing HEENT:  Pupils equal round and reactive, fundi not visualized, oral mucosa unremarkable, dentures NECK:  No jugular venous distention, waveform within normal limits, carotid upstroke brisk and symmetric, no bruits, no thyromegaly LUNGS:  Clear to auscultation bilaterally BACK:  No CVA tenderness HEART:  PMI not displaced or sustained,S1 and S2 within normal limits, no S3, no S4, no clicks, no rubs, no murmurs ABD:  Flat, positive bowel sounds normal in frequency in pitch, no bruits, no rebound, no guarding, no midline pulsatile mass, no hepatomegaly, no splenomegaly EXT:  2 plus pulses throughout, no edema, left groin without bruit or pulsatile mass.  EKG - Sinus rhythm, rate 56, axis within normal limits, intervals within normal limits, no acute ST-T wave changes.  07/04/2014   ASSESSMENT AND PLAN   CAD -  Is very difficult to sort her symptoms. They seem to be similar to symptoms she had at the time of her last [place].  At that time there was no ischemia.  No change in therapy or further planning is indicated.   Hyperlipemia -  I have given her a hand written note to get a lipid profile drawn.   HTN (hypertension) -  The blood pressure is at target. No change in medications is indicated. We will continue with therapeutic lifestyle changes (TLC).  TOBACCO ABUSE -  She understands that she needs to quit smoking completely. We discussed this again today.

## 2014-08-24 ENCOUNTER — Other Ambulatory Visit: Payer: Self-pay

## 2014-08-24 MED ORDER — ISOSORBIDE MONONITRATE ER 30 MG PO TB24: ORAL_TABLET | ORAL | Status: DC

## 2014-10-27 ENCOUNTER — Encounter (HOSPITAL_COMMUNITY): Payer: Self-pay | Admitting: Cardiovascular Disease

## 2014-11-21 ENCOUNTER — Other Ambulatory Visit: Payer: Self-pay | Admitting: Family Medicine

## 2014-11-21 DIAGNOSIS — R1084 Generalized abdominal pain: Secondary | ICD-10-CM

## 2014-11-21 DIAGNOSIS — R197 Diarrhea, unspecified: Secondary | ICD-10-CM

## 2014-11-23 ENCOUNTER — Ambulatory Visit
Admission: RE | Admit: 2014-11-23 | Discharge: 2014-11-23 | Disposition: A | Discharge status: Home / Self Care | Payer: Medicare Other | Source: Ambulatory Visit | Attending: Family Medicine | Admitting: Family Medicine

## 2014-11-23 DIAGNOSIS — R197 Diarrhea, unspecified: Secondary | ICD-10-CM

## 2014-11-23 DIAGNOSIS — R1084 Generalized abdominal pain: Secondary | ICD-10-CM

## 2014-11-23 MED ORDER — IOHEXOL 300 MG/ML  SOLN
100.0000 mL | Freq: Once | INTRAMUSCULAR | Status: AC | PRN
  Administered 2014-11-23: 100 mL via INTRAVENOUS

## 2014-11-28 ENCOUNTER — Observation Stay (HOSPITAL_COMMUNITY)
Admission: EM | Admit: 2014-11-28 | Discharge: 2014-12-01 | Disposition: A | Discharge status: Home / Self Care | Payer: Medicare Other | Attending: Internal Medicine | Admitting: Internal Medicine

## 2014-11-28 ENCOUNTER — Emergency Department (HOSPITAL_COMMUNITY): Payer: Medicare Other

## 2014-11-28 ENCOUNTER — Encounter (HOSPITAL_COMMUNITY): Payer: Self-pay | Admitting: Emergency Medicine

## 2014-11-28 DIAGNOSIS — K219 Gastro-esophageal reflux disease without esophagitis: Secondary | ICD-10-CM | POA: Insufficient documentation

## 2014-11-28 DIAGNOSIS — H538 Other visual disturbances: Secondary | ICD-10-CM | POA: Diagnosis not present

## 2014-11-28 DIAGNOSIS — R110 Nausea: Secondary | ICD-10-CM | POA: Diagnosis not present

## 2014-11-28 DIAGNOSIS — Z720 Tobacco use: Secondary | ICD-10-CM | POA: Diagnosis not present

## 2014-11-28 DIAGNOSIS — Z955 Presence of coronary angioplasty implant and graft: Secondary | ICD-10-CM | POA: Diagnosis not present

## 2014-11-28 DIAGNOSIS — I10 Essential (primary) hypertension: Secondary | ICD-10-CM | POA: Insufficient documentation

## 2014-11-28 DIAGNOSIS — E785 Hyperlipidemia, unspecified: Secondary | ICD-10-CM | POA: Diagnosis not present

## 2014-11-28 DIAGNOSIS — I2510 Atherosclerotic heart disease of native coronary artery without angina pectoris: Secondary | ICD-10-CM | POA: Diagnosis present

## 2014-11-28 DIAGNOSIS — R109 Unspecified abdominal pain: Secondary | ICD-10-CM | POA: Diagnosis not present

## 2014-11-28 DIAGNOSIS — R1084 Generalized abdominal pain: Secondary | ICD-10-CM

## 2014-11-28 DIAGNOSIS — R079 Chest pain, unspecified: Secondary | ICD-10-CM | POA: Diagnosis present

## 2014-11-28 DIAGNOSIS — Z79899 Other long term (current) drug therapy: Secondary | ICD-10-CM | POA: Diagnosis not present

## 2014-11-28 DIAGNOSIS — M791 Myalgia: Secondary | ICD-10-CM | POA: Insufficient documentation

## 2014-11-28 DIAGNOSIS — Z85828 Personal history of other malignant neoplasm of skin: Secondary | ICD-10-CM | POA: Diagnosis not present

## 2014-11-28 DIAGNOSIS — R531 Weakness: Secondary | ICD-10-CM | POA: Insufficient documentation

## 2014-11-28 DIAGNOSIS — R0789 Other chest pain: Secondary | ICD-10-CM | POA: Diagnosis present

## 2014-11-28 DIAGNOSIS — I25119 Atherosclerotic heart disease of native coronary artery with unspecified angina pectoris: Secondary | ICD-10-CM | POA: Insufficient documentation

## 2014-11-28 DIAGNOSIS — J441 Chronic obstructive pulmonary disease with (acute) exacerbation: Secondary | ICD-10-CM | POA: Insufficient documentation

## 2014-11-28 DIAGNOSIS — K5900 Constipation, unspecified: Secondary | ICD-10-CM | POA: Diagnosis present

## 2014-11-28 DIAGNOSIS — M1990 Unspecified osteoarthritis, unspecified site: Secondary | ICD-10-CM | POA: Diagnosis not present

## 2014-11-28 HISTORY — DX: Essential (primary) hypertension: I10

## 2014-11-28 LAB — URINALYSIS, ROUTINE W REFLEX MICROSCOPIC
Bilirubin Urine: NEGATIVE
GLUCOSE, UA: 250 mg/dL — AB
HGB URINE DIPSTICK: NEGATIVE
Ketones, ur: 15 mg/dL — AB
Leukocytes, UA: NEGATIVE
Nitrite: NEGATIVE
PROTEIN: 100 mg/dL — AB
Specific Gravity, Urine: 1.033 — ABNORMAL HIGH (ref 1.005–1.030)
Urobilinogen, UA: 0.2 mg/dL (ref 0.0–1.0)
pH: 6 (ref 5.0–8.0)

## 2014-11-28 LAB — I-STAT TROPONIN, ED: Troponin i, poc: 0 ng/mL (ref 0.00–0.08)

## 2014-11-28 LAB — DIFFERENTIAL
Basophils Absolute: 0 10*3/uL (ref 0.0–0.1)
Basophils Relative: 0 % (ref 0–1)
Eosinophils Absolute: 0.2 10*3/uL (ref 0.0–0.7)
Eosinophils Relative: 1 % (ref 0–5)
Lymphocytes Relative: 14 % (ref 12–46)
Lymphs Abs: 1.7 10*3/uL (ref 0.7–4.0)
Monocytes Absolute: 0.7 10*3/uL (ref 0.1–1.0)
Monocytes Relative: 6 % (ref 3–12)
NEUTROS ABS: 9 10*3/uL — AB (ref 1.7–7.7)
Neutrophils Relative %: 79 % — ABNORMAL HIGH (ref 43–77)

## 2014-11-28 LAB — COMPREHENSIVE METABOLIC PANEL
ALT: 14 U/L (ref 0–35)
AST: 20 U/L (ref 0–37)
Albumin: 3.8 g/dL (ref 3.5–5.2)
Alkaline Phosphatase: 56 U/L (ref 39–117)
Anion gap: 6 (ref 5–15)
BUN: 9 mg/dL (ref 6–23)
CALCIUM: 8.9 mg/dL (ref 8.4–10.5)
CO2: 28 mmol/L (ref 19–32)
Chloride: 98 mEq/L (ref 96–112)
Creatinine, Ser: 1.08 mg/dL (ref 0.50–1.10)
GFR calc Af Amer: 56 mL/min — ABNORMAL LOW (ref >90)
GFR calc non Af Amer: 48 mL/min — ABNORMAL LOW (ref >90)
Glucose, Bld: 128 mg/dL — ABNORMAL HIGH (ref 70–99)
Potassium: 3.4 mmol/L — ABNORMAL LOW (ref 3.5–5.1)
SODIUM: 132 mmol/L — AB (ref 135–145)
Total Bilirubin: 0.4 mg/dL (ref 0.3–1.2)
Total Protein: 6.5 g/dL (ref 6.0–8.3)

## 2014-11-28 LAB — CBC
HCT: 45.5 % (ref 36.0–46.0)
Hemoglobin: 15.6 g/dL — ABNORMAL HIGH (ref 12.0–15.0)
MCH: 31.1 pg (ref 26.0–34.0)
MCHC: 34.3 g/dL (ref 30.0–36.0)
MCV: 90.6 fL (ref 78.0–100.0)
Platelets: 280 10*3/uL (ref 150–400)
RBC: 5.02 MIL/uL (ref 3.87–5.11)
RDW: 12.5 % (ref 11.5–15.5)
WBC: 11.5 10*3/uL — AB (ref 4.0–10.5)

## 2014-11-28 LAB — URINE MICROSCOPIC-ADD ON

## 2014-11-28 LAB — TROPONIN I

## 2014-11-28 LAB — BRAIN NATRIURETIC PEPTIDE: B Natriuretic Peptide: 139.1 pg/mL — ABNORMAL HIGH (ref 0.0–100.0)

## 2014-11-28 LAB — LIPASE, BLOOD: Lipase: 36 U/L (ref 11–59)

## 2014-11-28 MED ORDER — ACETAMINOPHEN 650 MG RE SUPP: 650.0000 mg | Freq: Four times a day (QID) | RECTAL | Status: DC | PRN

## 2014-11-28 MED ORDER — ALBUTEROL SULFATE (2.5 MG/3ML) 0.083% IN NEBU: 2.5000 mg | INHALATION_SOLUTION | RESPIRATORY_TRACT | Status: DC | PRN

## 2014-11-28 MED ORDER — ACETAMINOPHEN 325 MG PO TABS
650.0000 mg | ORAL_TABLET | Freq: Four times a day (QID) | ORAL | Status: DC | PRN
  Administered 2014-11-29 – 2014-12-01 (×3): 650 mg via ORAL
  Filled 2014-11-28 (×3): qty 2

## 2014-11-28 MED ORDER — HYDROCHLOROTHIAZIDE 25 MG PO TABS
25.0000 mg | ORAL_TABLET | Freq: Every day | ORAL | Status: DC
  Filled 2014-11-28: qty 1

## 2014-11-28 MED ORDER — ENOXAPARIN SODIUM 40 MG/0.4ML ~~LOC~~ SOLN
40.0000 mg | SUBCUTANEOUS | Status: DC
  Administered 2014-11-29 – 2014-11-30 (×2): 40 mg via SUBCUTANEOUS
  Filled 2014-11-28 (×2): qty 0.4

## 2014-11-28 MED ORDER — SODIUM CHLORIDE 0.9 % IJ SOLN
3.0000 mL | Freq: Two times a day (BID) | INTRAMUSCULAR | Status: DC
  Administered 2014-11-28 – 2014-11-30 (×5): 3 mL via INTRAVENOUS

## 2014-11-28 MED ORDER — ASPIRIN EC 325 MG PO TBEC
325.0000 mg | DELAYED_RELEASE_TABLET | Freq: Every day | ORAL | Status: DC
  Administered 2014-11-29 – 2014-12-01 (×3): 325 mg via ORAL
  Filled 2014-11-28 (×3): qty 1

## 2014-11-28 MED ORDER — MILK AND MOLASSES ENEMA
1.0000 | Freq: Once | RECTAL | Status: AC
  Administered 2014-11-28: 250 mL via RECTAL
  Filled 2014-11-28: qty 250

## 2014-11-28 MED ORDER — ONDANSETRON HCL 4 MG PO TABS
4.0000 mg | ORAL_TABLET | Freq: Four times a day (QID) | ORAL | Status: DC | PRN
  Administered 2014-11-29: 4 mg via ORAL
  Filled 2014-11-28: qty 1

## 2014-11-28 MED ORDER — TRAMADOL HCL 50 MG PO TABS
50.0000 mg | ORAL_TABLET | Freq: Four times a day (QID) | ORAL | Status: DC | PRN
  Administered 2014-11-28 – 2014-11-30 (×5): 50 mg via ORAL
  Filled 2014-11-28 (×5): qty 1

## 2014-11-28 MED ORDER — SENNOSIDES-DOCUSATE SODIUM 8.6-50 MG PO TABS
1.0000 | ORAL_TABLET | Freq: Two times a day (BID) | ORAL | Status: DC
  Administered 2014-11-28 – 2014-12-01 (×6): 1 via ORAL
  Filled 2014-11-28 (×6): qty 1

## 2014-11-28 MED ORDER — GI COCKTAIL ~~LOC~~
30.0000 mL | Freq: Once | ORAL | Status: AC
  Administered 2014-11-28: 30 mL via ORAL
  Filled 2014-11-28: qty 30

## 2014-11-28 MED ORDER — ASPIRIN 81 MG PO CHEW
324.0000 mg | CHEWABLE_TABLET | Freq: Once | ORAL | Status: AC
  Administered 2014-11-28: 324 mg via ORAL
  Filled 2014-11-28: qty 4

## 2014-11-28 MED ORDER — IPRATROPIUM BROMIDE 0.02 % IN SOLN
0.5000 mg | Freq: Four times a day (QID) | RESPIRATORY_TRACT | Status: DC
  Administered 2014-11-28: 0.5 mg via RESPIRATORY_TRACT
  Filled 2014-11-28: qty 2.5

## 2014-11-28 MED ORDER — VITAMIN D 1000 UNITS PO TABS
1000.0000 [IU] | ORAL_TABLET | Freq: Every day | ORAL | Status: DC
  Administered 2014-11-29 – 2014-12-01 (×3): 1000 [IU] via ORAL
  Filled 2014-11-28 (×4): qty 1

## 2014-11-28 MED ORDER — ONDANSETRON HCL 4 MG/2ML IJ SOLN
4.0000 mg | Freq: Four times a day (QID) | INTRAMUSCULAR | Status: DC | PRN
  Administered 2014-11-29 (×2): 4 mg via INTRAVENOUS
  Filled 2014-11-28 (×2): qty 2

## 2014-11-28 MED ORDER — MAGNESIUM CITRATE PO SOLN: 1.0000 | Freq: Once | ORAL | Status: AC | PRN

## 2014-11-28 MED ORDER — ALBUTEROL SULFATE (2.5 MG/3ML) 0.083% IN NEBU
2.5000 mg | INHALATION_SOLUTION | RESPIRATORY_TRACT | Status: DC | PRN
  Administered 2014-11-28: 2.5 mg via RESPIRATORY_TRACT
  Filled 2014-11-28: qty 3

## 2014-11-28 MED ORDER — ALBUTEROL SULFATE (2.5 MG/3ML) 0.083% IN NEBU
2.5000 mg | INHALATION_SOLUTION | Freq: Four times a day (QID) | RESPIRATORY_TRACT | Status: DC
  Administered 2014-11-28: 2.5 mg via RESPIRATORY_TRACT
  Filled 2014-11-28: qty 3

## 2014-11-28 MED ORDER — PREDNISONE 20 MG PO TABS
40.0000 mg | ORAL_TABLET | Freq: Every day | ORAL | Status: DC
  Administered 2014-11-29 – 2014-12-01 (×3): 40 mg via ORAL
  Filled 2014-11-28 (×3): qty 2

## 2014-11-28 MED ORDER — SALINE SPRAY 0.65 % NA SOLN
1.0000 | NASAL | Status: DC | PRN
  Filled 2014-11-28: qty 44

## 2014-11-28 MED ORDER — METHYLPREDNISOLONE SODIUM SUCC 125 MG IJ SOLR: 60.0000 mg | Freq: Once | INTRAMUSCULAR | Status: DC

## 2014-11-28 MED ORDER — POLYETHYLENE GLYCOL 3350 17 G PO PACK
17.0000 g | PACK | Freq: Every day | ORAL | Status: DC
  Administered 2014-11-29 – 2014-11-30 (×2): 17 g via ORAL
  Filled 2014-11-28 (×3): qty 1

## 2014-11-28 MED ORDER — FAMOTIDINE 20 MG PO TABS
40.0000 mg | ORAL_TABLET | Freq: Every day | ORAL | Status: DC
  Administered 2014-11-28 – 2014-11-30 (×3): 40 mg via ORAL
  Filled 2014-11-28 (×3): qty 2

## 2014-11-28 MED ORDER — GUAIFENESIN ER 600 MG PO TB12
600.0000 mg | ORAL_TABLET | Freq: Two times a day (BID) | ORAL | Status: DC
  Administered 2014-11-28 – 2014-12-01 (×6): 600 mg via ORAL
  Filled 2014-11-28 (×6): qty 1

## 2014-11-28 MED ORDER — ONDANSETRON HCL 4 MG/2ML IJ SOLN: 4.0000 mg | Freq: Three times a day (TID) | INTRAMUSCULAR | Status: DC | PRN

## 2014-11-28 MED ORDER — ISOSORBIDE MONONITRATE ER 30 MG PO TB24
30.0000 mg | ORAL_TABLET | Freq: Every day | ORAL | Status: DC
  Administered 2014-11-29 – 2014-12-01 (×3): 30 mg via ORAL
  Filled 2014-11-28 (×3): qty 1

## 2014-11-28 MED ORDER — IPRATROPIUM-ALBUTEROL 0.5-2.5 (3) MG/3ML IN SOLN
3.0000 mL | Freq: Once | RESPIRATORY_TRACT | Status: AC
  Administered 2014-11-28: 3 mL via RESPIRATORY_TRACT
  Filled 2014-11-28: qty 3

## 2014-11-28 MED ORDER — ALBUTEROL SULFATE (2.5 MG/3ML) 0.083% IN NEBU: 3.0000 mL | INHALATION_SOLUTION | RESPIRATORY_TRACT | Status: DC | PRN

## 2014-11-28 MED ORDER — LACTULOSE 10 GM/15ML PO SOLN
30.0000 g | Freq: Two times a day (BID) | ORAL | Status: DC
  Administered 2014-11-28 – 2014-11-30 (×4): 30 g via ORAL
  Filled 2014-11-28 (×4): qty 45

## 2014-11-28 MED ORDER — NICOTINE 14 MG/24HR TD PT24
14.0000 mg | MEDICATED_PATCH | Freq: Every day | TRANSDERMAL | Status: DC
  Administered 2014-11-28 – 2014-12-01 (×4): 14 mg via TRANSDERMAL
  Filled 2014-11-28 (×4): qty 1

## 2014-11-28 MED ORDER — HYDRALAZINE HCL 20 MG/ML IJ SOLN: 10.0000 mg | Freq: Four times a day (QID) | INTRAMUSCULAR | Status: DC | PRN

## 2014-11-28 MED ORDER — PROPRANOLOL HCL 20 MG PO TABS
20.0000 mg | ORAL_TABLET | Freq: Two times a day (BID) | ORAL | Status: DC
  Administered 2014-11-28 – 2014-12-01 (×6): 20 mg via ORAL
  Filled 2014-11-28 (×7): qty 1

## 2014-11-28 MED ORDER — TIOTROPIUM BROMIDE MONOHYDRATE 18 MCG IN CAPS: 18.0000 ug | ORAL_CAPSULE | Freq: Every day | RESPIRATORY_TRACT | Status: DC

## 2014-11-28 MED ORDER — PSYLLIUM 28 % PO PACK: 1.0000 | PACK | Freq: Two times a day (BID) | ORAL | Status: DC

## 2014-11-28 MED ORDER — BISACODYL 10 MG RE SUPP: 10.0000 mg | Freq: Every day | RECTAL | Status: DC | PRN

## 2014-11-28 MED ORDER — HYDROCODONE-ACETAMINOPHEN 5-325 MG PO TABS
1.0000 | ORAL_TABLET | Freq: Four times a day (QID) | ORAL | Status: DC | PRN
  Administered 2014-11-28 – 2014-11-30 (×3): 1 via ORAL
  Filled 2014-11-28 (×3): qty 1

## 2014-11-28 MED ORDER — POTASSIUM CHLORIDE CRYS ER 20 MEQ PO TBCR: 40.0000 meq | EXTENDED_RELEASE_TABLET | Freq: Once | ORAL | Status: DC

## 2014-11-28 NOTE — ED Notes (Signed)
Cardiology at bedside.

## 2014-11-28 NOTE — ED Notes (Signed)
Pharmacy tech at bedside 

## 2014-11-28 NOTE — ED Notes (Signed)
Pt returned from xray and ambulated to attempt to provide UA

## 2014-11-28 NOTE — ED Provider Notes (Signed)
CSN: [id number]     Arrival date & time 11/28/14  1019 History   First MD Initiated Contact with Patient 11/28/14 1033     Chief Complaint  Patient presents with  . Chest Pain  . Abdominal Pain  . Shortness of Breath     (Consider location/radiation/quality/duration/timing/severity/associated sxs/prior Treatment) HPI [name] is a 80 y.o. female with history of hypertension, COPD,  CAD, presents to ED with complaint of chest pain and abdominal pain. Pt states she has had pain for "few months now," states followed by PCP and primary care doctor. States symptoms worsened last night. States was unable to sleep due to her chest pain. States took one [employer] yesterday and two this morning with no relief. States was not feeling well so called EMS. Pt received neb and solumedrol by EMS. She states she does not think her symptoms improved.  Past Medical History  Diagnosis Date  . Hyperlipemia   . Unspecified essential hypertension   . GERD (gastroesophageal reflux disease)   . CAD (coronary artery disease)     Multiple percutaneous revascularization. Catheterization May 2013 left main normal, patent LAD stent, 90% circumflex stenosis, patent right coronary artery stents. Patient had a DES to the circumflex. The EF was well-preserved.  Marland Kitchen COPD (chronic obstructive pulmonary disease)   . Anginal pain   . Hard of hearing   . Arthritis     "aching bones sometimes"  . 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 Fatigue   . Night sweats   . SOB (shortness of breath)     with exertion  . Joint pain   . Headache   . Blurred vision   . Skin cancer of nose 09/2813    right inferior bridge of nose, microinvasive squamous cell  . Hx of radiation therapy 02/07/14- 03/10/14    right nose 4500 cGy in 10 sessions over 5 weeks (2 treatments a week)   Past Surgical History  Procedure Laterality Date  . Lumbar spine surgery  04/2011    Rods, screws and cages  . Skin biopsy  10/15/13   right nose-microinvasive squamous cell carcinoma  . Abdominal hysterectomy    . Coronary angioplasty with stent placement  2002-2009    x 5  . Coronary angioplasty with stent placement  05/06/12    "1; this makes 5"  . Esophageal dilation  ~ 2012    Dr. Deatra Ina  . Percutaneous coronary stent intervention (pci-s) N/A 05/06/2012    Procedure: PERCUTANEOUS CORONARY STENT INTERVENTION (PCI-S);  Surgeon: Burnell Blanks, MD;  Location: [facility] CATH LAB;  Service: Cardiovascular;  Laterality: N/A;   Family History  Problem Relation Age of Onset  . Glaucoma Father   . Heart attack Father   . Bone cancer Mother    History  Substance Use Topics  . Smoking status: Current Every Day Smoker -- 0.50 packs/day for 55 years    Types: Cigarettes  . Smokeless tobacco: Former Systems developer    Types: Snuff  . Alcohol Use: No   OB History    No data available     Review of Systems  Constitutional: Negative for fever and chills.  Respiratory: Positive for chest tightness and shortness of breath. Negative for cough.   Cardiovascular: Positive for chest pain. Negative for palpitations and leg swelling.  Gastrointestinal: Positive for nausea and abdominal pain. Negative for vomiting and diarrhea.  Genitourinary: Negative for dysuria and flank pain.  Musculoskeletal: Positive for myalgias.  Negative for neck pain and neck stiffness.  Skin: Negative for rash.  Neurological: Positive for weakness. Negative for dizziness and headaches.  All other systems reviewed and are negative.     Allergies  Iohexol and Lipitor  Home Medications   Prior to Admission medications   Medication Sig Start Date End Date Taking? Authorizing Provider  albuterol (PROVENTIL HFA;VENTOLIN HFA) 108 (90 BASE) MCG/ACT inhaler Inhale 2 puffs into the lungs every 4 (four) hours as needed for wheezing. 02/03/13   Kathie Dike, MD  cholecalciferol (VITAMIN D) 1000 UNITS tablet Take 1,000 Units by mouth daily.     Historical Provider,  MD  guaiFENesin (MUCINEX) 600 MG 12 hr tablet Take 1 tablet (600 mg total) by mouth 2 (two) times daily. 02/03/13   Kathie Dike, MD  hydrochlorothiazide (HYDRODIURIL) 25 MG tablet Take 25 mg by mouth daily.     Historical Provider, MD  isosorbide mononitrate (IMDUR) 30 MG 24 hr tablet TAKE ONE TABLET BY MOUTH EVERY DAY 08/24/14   Minus Breeding, MD  NITROSTAT 0.4 MG SL tablet DISSOLVE ONE TABLET UNDER THE TONGUE EVERY 5 MINUTES AS NEEDED FOR CHEST PAIN.  DO NOT EXCEED A TOTAL OF 3 DOSES IN 15 MINUTES    Minus Breeding, MD  propranolol (INDERAL) 40 MG tablet Take 20 mg by mouth 2 (two) times daily.     Historical Provider, MD  psyllium (METAMUCIL SMOOTH TEXTURE) 28 % packet Take 1 packet by mouth 2 (two) times daily.    Historical Provider, MD  ranitidine (ZANTAC) 300 MG tablet  01/21/14   Historical Provider, MD  sodium chloride (OCEAN) 0.65 % SOLN nasal spray Place 1 spray into both nostrils as needed for congestion.    Historical Provider, MD  tiotropium (SPIRIVA) 18 MCG inhalation capsule Place 1 capsule (18 mcg total) into inhaler and inhale daily. 02/03/13   Kathie Dike, MD  traMADol (ULTRAM) 50 MG tablet Take 50 mg by mouth every 6 (six) hours as needed.  01/25/14   Historical Provider, MD   BP 136/76 mmHg  Pulse 55  Temp(Src) 97.7 F (36.5 C) (Oral)  Resp 18  Ht 5\' 5"  (1.651 m)  Wt 174 lb (78.926 kg)  BMI 28.96 kg/m2  SpO2 100% Physical Exam  Constitutional: She is oriented to person, place, and time. She appears well-developed and well-nourished. No distress.  HENT:  Head: Normocephalic.  Eyes: Conjunctivae are normal.  Neck: Normal range of motion. Neck supple.  Cardiovascular: Normal rate, regular rhythm and normal heart sounds.   Pulmonary/Chest: Effort normal. No respiratory distress. She has wheezes. She has no rales.  cracles at bases bilaterally. Mild expiratory wheezes.   Abdominal: Soft. Bowel sounds are normal. She exhibits no distension. There is tenderness. There is  no rebound.  Diffuse tenderness  Genitourinary:  Normal rectum. No stool in rectum  Musculoskeletal: She exhibits no edema.  Neurological: She is alert and oriented to person, place, and time.  Skin: Skin is warm and dry.  Psychiatric: She has a normal mood and affect. Her behavior is normal.  Nursing note and vitals reviewed.   ED Course  Procedures (including critical care time) Labs Review Labs Reviewed  CBC - Abnormal; Notable for the following:    WBC 11.5 (*)    Hemoglobin 15.6 (*)    All other components within normal limits  BRAIN NATRIURETIC PEPTIDE - Abnormal; Notable for the following:    B Natriuretic Peptide 139.1 (*)    All other components within normal limits  COMPREHENSIVE METABOLIC PANEL - Abnormal; Notable for the following:    Sodium 132 (*)    Potassium 3.4 (*)    Glucose, Bld 128 (*)    GFR calc non Af Amer 48 (*)    GFR calc Af Amer 56 (*)    All other components within normal limits  DIFFERENTIAL - Abnormal; Notable for the following:    Neutrophils Relative % 79 (*)    Neutro Abs 9.0 (*)    All other components within normal limits  LIPASE, BLOOD  URINALYSIS, ROUTINE W REFLEX MICROSCOPIC  I-STAT TROPOININ, ED    Imaging Review Dg Chest 2 View  11/28/2014   CLINICAL DATA:  Diffuse pain.  Nausea.  Symptoms for 2 days.  EXAM: CHEST  2 VIEW  COMPARISON:  CT chest and Single view of the chest 01/30/2013.  FINDINGS: Single view of the chest demonstrates clear lungs without edema or consolidative process the chest is hyperexpanded. There is cardiomegaly. No pneumothorax or pleural effusion is seen. There is partial visualization of spinal fusion hardware.  IMPRESSION: No acute disease.  Cardiomegaly and emphysema.   Electronically Signed   By: Inge Rise M.D.   On: 11/28/2014 11:47   Dg Abd 2 Views  11/28/2014   CLINICAL DATA:  Diffuse abdominal pain and nausea for 2 days.  EXAM: ABDOMEN - 2 VIEW  COMPARISON:  CT abdomen pelvis 11/23/2014.  FINDINGS:  Fair amount of stool is seen in the colon. Mild gaseous prominence of small bowel in association. No free air. Lung bases are unremarkable. Postoperative changes are seen in the lumbar spine.  IMPRESSION: Probable constipation.   Electronically Signed   By: Lorin Picket M.D.   On: 11/28/2014 11:46     EKG Interpretation   Date/Time:  Monday November 28 2014 10:28:13 EST Ventricular Rate:  57 PR Interval:  169 QRS Duration: 108 QT Interval:  441 QTC Calculation: 429 R Axis:   70 Text Interpretation:  Sinus rhythm RSR' in V1 or V2, right VCD or RVH No  significant change since last tracing Confirmed by [facility]  MD, MARTHA  [phone]) on 11/28/2014 3:00:38 PM      MDM   Final diagnoses:  Chest pain  Abdominal pain, unspecified abdominal location    Pt with multiple complaints. Chronic chest pain and abdominal pain that wornsened last night. Pt unable to sleep. No relief with nitro. States chest pain is what bothers her the most. Abdomen diffusely tender, with no guarding or peritoneal signs. Lungs with slight wheezing and rales. Will try another duoneb. Pt already received solumedrol and one breathing tx prior to arrival. Will get abd xray, cxr, labs.   Labs with no findings to explain pts symptoms. Abd films showing probably constipation. I did rectal exam, no stool in rectum. Will try an enema. Doubt surgical abd. CT abd/pelvis perfomed outpatient 5 days ago   1:56 PM Spoke with cardiology, will come see. Pt received an enema. No MB yet.   4:05 PM Pt still waiting on cardiology. Continues to have chest pain. Asked for food, and ate a sandwich. Had as mall bowel movement. Will continue to monitor.   5:11 PM Pt seen by cardiology. Will  Stress tomorrow. Asked for medicine to admit. Spoke with [facility], will admit.    Filed Vitals:   11/28/14 1400 11/28/14 1556 11/28/14 1600 11/28/14 1630  BP: 155/93 150/76 158/76 174/79  Pulse: 77 80 80 79  Temp:      TempSrc:  Resp: 20 20 18  20   Height:      Weight:      SpO2: 96% 98% 96% 94%     Renold Genta, PA-C 11/28/14 [place], MD 11/29/14 [phone]

## 2014-11-28 NOTE — ED Notes (Addendum)
Patient ambulated to restroom with steady gait and no ShoB

## 2014-11-28 NOTE — H&P (Signed)
Patient's PCP: Leonard Downing, MD  Cardiologist: Dr. Percival Spanish  Chief Complaint: Chest and abdominal pain  History of Present Illness: [name] is a 80 y.o. Caucasian female with history of coronary artery disease status post stents, GERD, hyperlipidemia, hypertension, ongoing tobacco use, constipation, COPD, hard of hearing, and arthritis who presents with the above complaints.  Patient reports that she has been having ongoing chest pain for the last 2 years.  She indicates the squeezing like sensation starts in her left breast which then radiates to her right breast and left shoulder.  She at times has pain down her left arm.  Denies any jaw pain.  Reports having sweats at times.  Over the last 2 days, she has had worsening symptoms.  As a result she presented to the emergency department for further evaluation.  She was evaluated by cardiology who recommended medical admission.  She has been having abdominal pain for the last 2 months.  She had a CT of her abdomen and pelvis with contrast on 11/23/2014 which showed sigmoid diverticulosis without inflammation.  She denies any recent fevers, chills, headaches or vision changes.  She does admit to having some nausea but no vomiting.  Admits to having some shortness of breath at times.  Reports having some diarrhea after being constipated for some time.  She reports having a bowel movement 3-4 times a week.  Review of Systems: All systems reviewed with the patient and positive as per history of present illness, otherwise all other systems are negative.  Past Medical History  Diagnosis Date  . Hyperlipemia   . HTN (hypertension)   . GERD (gastroesophageal reflux disease)   . CAD (coronary artery disease)     Multiple percutaneous revascularization. Catheterization May 2013 left main normal, patent LAD stent, 90% circumflex stenosis, patent right coronary artery stents. Patient had a DES to the circumflex. The EF was well-preserved.  Marland Kitchen COPD  (chronic obstructive pulmonary disease)   . Hard of hearing   . Arthritis     "aching bones sometimes"  . Squamous cell carcinoma of nose     right  . Fatigue   . Blurred vision   . Hx of radiation therapy     right nose 4500 cGy in 10 sessions over 5 weeks (2 treatments a week)   Past Surgical History  Procedure Laterality Date  . Lumbar spine surgery  04/2011    Rods, screws and cages  . Skin biopsy  10/15/13    right nose-microinvasive squamous cell carcinoma  . Abdominal hysterectomy    . Coronary angioplasty with stent placement  2002-2009    x 5  . Coronary angioplasty with stent placement  05/06/12    "1; this makes 5"  . Esophageal dilation  ~ 2012    Dr. Deatra Ina  . Percutaneous coronary stent intervention (pci-s) N/A 05/06/2012    Procedure: PERCUTANEOUS CORONARY STENT INTERVENTION (PCI-S);  Surgeon: Burnell Blanks, MD;  Location: [facility] CATH LAB;  Service: Cardiovascular;  Laterality: N/A;   Family History  Problem Relation Age of Onset  . Glaucoma Father   . Heart attack Father   . Bone cancer Mother    History   Social History  . Marital Status: Widowed    Spouse Name: N/A    Number of Children: 4  . Years of Education: N/A   Occupational History  . retired    Social History Main Topics  . Smoking status: Current Every Day Smoker -- 0.50 packs/day for 55  years    Types: Cigarettes  . Smokeless tobacco: Former Systems developer    Types: Snuff  . Alcohol Use: No  . Drug Use: No  . Sexual Activity: No     Comment: 1 cig to 1/4 of a pack a day   Other Topics Concern  . Not on file   Social History Narrative   Allergies: Iohexol and Lipitor  Home Meds: Prior to Admission medications   Medication Sig Start Date End Date Taking? Authorizing Provider  acetaminophen (TYLENOL) 650 MG CR tablet Take 650 mg by mouth every 8 (eight) hours as needed for pain.   Yes Historical Provider, MD  albuterol (PROVENTIL HFA;VENTOLIN HFA) 108 (90 BASE) MCG/ACT inhaler Inhale  2 puffs into the lungs every 4 (four) hours as needed for wheezing. 02/03/13  Yes Kathie Dike, MD  cholecalciferol (VITAMIN D) 1000 UNITS tablet Take 1,000 Units by mouth daily.    Yes Historical Provider, MD  guaiFENesin (MUCINEX) 600 MG 12 hr tablet Take 1 tablet (600 mg total) by mouth 2 (two) times daily. 02/03/13  Yes Kathie Dike, MD  hydrochlorothiazide (HYDRODIURIL) 25 MG tablet Take 25 mg by mouth daily.    Yes Historical Provider, MD  isosorbide mononitrate (IMDUR) 30 MG 24 hr tablet TAKE ONE TABLET BY MOUTH EVERY DAY 08/24/14  Yes Minus Breeding, MD  NITROSTAT 0.4 MG SL tablet DISSOLVE ONE TABLET UNDER THE TONGUE EVERY 5 MINUTES AS NEEDED FOR CHEST PAIN.  DO NOT EXCEED A TOTAL OF 3 DOSES IN 15 MINUTES   Yes Minus Breeding, MD  propranolol (INDERAL) 40 MG tablet Take 20 mg by mouth 2 (two) times daily.    Yes Historical Provider, MD  psyllium (METAMUCIL SMOOTH TEXTURE) 28 % packet Take 1 packet by mouth 2 (two) times daily.   Yes Historical Provider, MD  ranitidine (ZANTAC) 300 MG tablet  01/21/14  Yes Historical Provider, MD  tiotropium (SPIRIVA) 18 MCG inhalation capsule Place 1 capsule (18 mcg total) into inhaler and inhale daily. 02/03/13  Yes Kathie Dike, MD  traMADol (ULTRAM) 50 MG tablet Take 50 mg by mouth every 6 (six) hours as needed.  01/25/14  Yes Historical Provider, MD  sodium chloride (OCEAN) 0.65 % SOLN nasal spray Place 1 spray into both nostrils as needed for congestion.    Historical Provider, MD    Physical Exam: Blood pressure 177/79, pulse 76, temperature 97.7 F (36.5 C), temperature source Oral, resp. rate 16, height 5\' 5"  (1.651 m), weight 78.926 kg (174 lb), SpO2 94 %. General: Awake, Oriented x3, No acute distress. HEENT: EOMI, Moist mucous membranes Neck: Supple CV: S1 and S2 Lungs: Good air movement bilaterally, scattered wheezing. Abdomen: Soft, generalized abdominal pain, no specific area of tenderness, Nondistended, +bowel sounds. Ext: Good pulses.  Trace edema. No clubbing or cyanosis noted. Neuro: Cranial Nerves II-XII grossly intact. Has 5/5 motor strength in upper and lower extremities.  Lab results:  Recent Labs  11/28/14 1049  NA 132*  K 3.4*  CL 98  CO2 28  GLUCOSE 128*  BUN 9  CREATININE 1.08  CALCIUM 8.9    Recent Labs  11/28/14 1049  AST 20  ALT 14  ALKPHOS 56  BILITOT 0.4  PROT 6.5  ALBUMIN 3.8    Recent Labs  11/28/14 1049  LIPASE 36    Recent Labs  11/28/14 1049  WBC 11.5*  NEUTROABS 9.0*  HGB 15.6*  HCT 45.5  MCV 90.6  PLT 280   No results for input(s): CKTOTAL, CKMB,  CKMBINDEX, TROPONINI in the last 72 hours. Invalid input(s): POCBNP No results for input(s): DDIMER in the last 72 hours. No results for input(s): HGBA1C in the last 72 hours. No results for input(s): CHOL, HDL, LDLCALC, TRIG, CHOLHDL, LDLDIRECT in the last 72 hours. No results for input(s): TSH, T4TOTAL, T3FREE, THYROIDAB in the last 72 hours.  Invalid input(s): FREET3 No results for input(s): VITAMINB12, FOLATE, FERRITIN, TIBC, IRON, RETICCTPCT in the last 72 hours. Imaging results:  Dg Chest 2 View  11/28/2014   CLINICAL DATA:  Diffuse pain.  Nausea.  Symptoms for 2 days.  EXAM: CHEST  2 VIEW  COMPARISON:  CT chest and Single view of the chest 01/30/2013.  FINDINGS: Single view of the chest demonstrates clear lungs without edema or consolidative process the chest is hyperexpanded. There is cardiomegaly. No pneumothorax or pleural effusion is seen. There is partial visualization of spinal fusion hardware.  IMPRESSION: No acute disease.  Cardiomegaly and emphysema.   Electronically Signed   By: Inge Rise M.D.   On: 11/28/2014 11:47   Ct Abdomen Pelvis W Contrast  11/23/2014   CLINICAL DATA:  Generalized abdominal pain for several months.  EXAM: CT ABDOMEN AND PELVIS WITH CONTRAST  TECHNIQUE: Multidetector CT imaging of the abdomen and pelvis was performed using the standard protocol following bolus administration of  intravenous contrast.  CONTRAST:  111mL OMNIPAQUE IOHEXOL 300 MG/ML  SOLN  COMPARISON:  CT scan of January 30, 2013.  FINDINGS: Status post surgical posterior fusion of the lumbar spine. Visualized lung bases appear normal.  No gallstones are noted. The liver, spleen and pancreas appear normal. Adrenal glands appear normal. No hydronephrosis or renal obstruction is noted. No renal or ureteral calculi are noted. Bilateral cortical scarring of the kidneys is noted. Bilateral stable simple cysts are noted. Atherosclerotic calcifications of abdominal aorta and iliac arteries are noted without aneurysm formation.  There is no evidence of bowel obstruction. Sigmoid diverticulosis is noted without inflammation. No abnormal fluid collection is noted. Status post hysterectomy. Urinary bladder appears normal. Ovaries appear normal. No significant adenopathy is noted.  IMPRESSION: Sigmoid diverticulosis is noted without inflammation.  Cortical scarring of both kidneys is noted. No hydronephrosis or renal obstruction is noted. Stable bilateral renal cysts.  No acute abnormality seen in the abdomen or pelvis.   Electronically Signed   By: Sabino Dick M.D.   On: 11/23/2014 16:57   Dg Abd 2 Views  11/28/2014   CLINICAL DATA:  Diffuse abdominal pain and nausea for 2 days.  EXAM: ABDOMEN - 2 VIEW  COMPARISON:  CT abdomen pelvis 11/23/2014.  FINDINGS: Fair amount of stool is seen in the colon. Mild gaseous prominence of small bowel in association. No free air. Lung bases are unremarkable. Postoperative changes are seen in the lumbar spine.  IMPRESSION: Probable constipation.   Electronically Signed   By: Lorin Picket M.D.   On: 11/28/2014 11:46   Other results: EKG: Sinus rhythm with heart rate of 57.  Assessment & Plan by Problem: Chest pain Admit the patient to telemetry.  Cycle cardiac and last rule the patient out for acute coronary syndrome.  Initial troponin negative.  Continue aspirin.  Cardiology consulted,  appreciate their input.  Patient made nothing by mouth after midnight for consideration of stress test tomorrow.  Question if patient's chest pain may be coming from mild COPD exacerbation.  Check hemoglobin A1c and lipid panel in the morning to risk stratify the patient.  COPD exacerbation Likely due to ongoing  tobacco use.  Patient counseled on smoking cessation.  Patient given a dose of Solu-Medrol and nebulizer therapy in the emergency department.  Continue 1 more dose of Solu-Medrol.  Start patient on prednisone 40 mg daily.  Continue scheduled and as needed nebulizer therapy.  Patient has scattered wheezing on exam, however patient is moving air well in her lungs.  Do not see need for antibiotics at this time, if respiratory status worsens, low threshold for starting patient on antibiotics.  Abdominal pain Likely due to constipation.  Recent CT of abdomen and pelvis negative.  Abdominal x-ray shows findings suggestive of constipation.  Patient given an enema in the emergency department.  Start aggressive bowel regimen with stool softeners.  Will give patient 1 more dose of enema during the night.  Uncontrolled hypertension Stable.  Continue home antihypertensive medications.  When necessary hydralazine for systolic blood pressure greater than 160.  Hyperlipidemia Check lipid panel in the morning.  Tobacco abuse Counseled on cessation.  Nicotine patch prescribed.  Prophylaxis Lovenox.  CODE STATUS Full code.  This was discussed with the patient at the time of admission.  Disposition Admit the patient to telemetry as observation.  Time spent on admission, talking to the patient, and coordinating care was: 50 mins.  Romonia Yanik A, MD 11/28/2014, 5:39 PM

## 2014-11-28 NOTE — Consult Note (Signed)
Patient ID: [name] MRN: [id number], DOB/AGE: [birth year]   Admit date: 11/28/2014   Primary Physician: Leonard Downing, MD Primary Cardiologist: Dr. Percival Spanish   Pt. Profile:  [name] is a 80 y.o. female with a history of CAD s/p multiple stents, GERD, HLD, HTN, continued tobacco abuse and COPD who presents to [facility] today with chest pain and abdominal pain.    She has had multiple stents placed and last heart cath 04/2012 which reavealed 1. Triple vessel CAD with patent stents in the LAD, diagonal, RCA. 2. Severe stenosis in the proximal Circumflex.  3. Preserved LV systolic function.  A staged PCI with DES to the LCx was subsequently performed  She was last seen in clinic by Dr. Percival Spanish in 06/2014 where she complained of continued chest and arm discomfort as well as multiple other somatic complaints. He felt that her sx were similar to those that she had prior to a negative lexiscan myoview 07/2013 and decided to not proceed with further ischemic w/u.  Today she presents with chest pain and abdominal pain. This has been ongoing since her last visit with Dr. Percival Spanish, but it became acutely worse last night. States was unable to sleep due to her chest pain. States took one [employer] yesterday and two this morning with no relief. She felt so badly this AM that she decided to call EMS. The chest pain is left sided under her left breast and radiates across her epigastrium. It is associated with SOB and nausea. No diaphoresis. When asked if this was reminiscent of her cardiac chest pain prior to previous stenting, she said that she did not have chest pain before her stents. She only had left arm pain. She has some left hand numbness. She recently had an abdominal CT ordered by her PCP, Dr. Arelia Sneddon which revealed no acute abnormality.   Labs with no findings to explain pts symptoms. Abd films showing probably constipation. I did rectal exam, no stool in rectum. Will try an enema.  Doubt surgical abd. CT abd/pelvis perfomed outpatient 5 days ago.   Problem List  Past Medical History  Diagnosis Date  . Hyperlipemia   . Unspecified essential hypertension   . GERD (gastroesophageal reflux disease)   . CAD (coronary artery disease)     Multiple percutaneous revascularization. Catheterization May 2013 left main normal, patent LAD stent, 90% circumflex stenosis, patent right coronary artery stents. Patient had a DES to the circumflex. The EF was well-preserved.  Marland Kitchen COPD (chronic obstructive pulmonary disease)   . Anginal pain   . Hard of hearing   . Arthritis     "aching bones sometimes"  . 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 Fatigue   . Night sweats   . SOB (shortness of breath)     with exertion  . Joint pain   . Headache   . Blurred vision   . Skin cancer of nose 09/2813    right inferior bridge of nose, microinvasive squamous cell  . Hx of radiation therapy 02/07/14- 03/10/14    right nose 4500 cGy in 10 sessions over 5 weeks (2 treatments a week)    Past Surgical History  Procedure Laterality Date  . Lumbar spine surgery  04/2011    Rods, screws and cages  . Skin biopsy  10/15/13    right nose-microinvasive squamous cell carcinoma  . Abdominal hysterectomy    . Coronary angioplasty with stent placement  2002-2009  x 5  . Coronary angioplasty with stent placement  05/06/12    "1; this makes 5"  . Esophageal dilation  ~ 2012    Dr. Deatra Ina  . Percutaneous coronary stent intervention (pci-s) N/A 05/06/2012    Procedure: PERCUTANEOUS CORONARY STENT INTERVENTION (PCI-S);  Surgeon: Burnell Blanks, MD;  Location: [facility] CATH LAB;  Service: Cardiovascular;  Laterality: N/A;     Allergies  Allergies  Allergen Reactions  . Iohexol      Code: HIVES, Desc: PER ROBIN @ GI, PT IS ALLERGIC TO IVP DYE 08/28/10/RM, Onset Date: 28413244   . Lipitor [Atorvastatin Calcium] Hives    Hives per pt. She states she thinks this is the right medicine      Home Medications  Prior to Admission medications   Medication Sig Start Date End Date Taking? Authorizing Provider  acetaminophen (TYLENOL) 650 MG CR tablet Take 650 mg by mouth every 8 (eight) hours as needed for pain.   Yes Historical Provider, MD  albuterol (PROVENTIL HFA;VENTOLIN HFA) 108 (90 BASE) MCG/ACT inhaler Inhale 2 puffs into the lungs every 4 (four) hours as needed for wheezing. 02/03/13  Yes Kathie Dike, MD  cholecalciferol (VITAMIN D) 1000 UNITS tablet Take 1,000 Units by mouth daily.    Yes Historical Provider, MD  guaiFENesin (MUCINEX) 600 MG 12 hr tablet Take 1 tablet (600 mg total) by mouth 2 (two) times daily. 02/03/13  Yes Kathie Dike, MD  hydrochlorothiazide (HYDRODIURIL) 25 MG tablet Take 25 mg by mouth daily.    Yes Historical Provider, MD  isosorbide mononitrate (IMDUR) 30 MG 24 hr tablet TAKE ONE TABLET BY MOUTH EVERY DAY 08/24/14  Yes Minus Breeding, MD  NITROSTAT 0.4 MG SL tablet DISSOLVE ONE TABLET UNDER THE TONGUE EVERY 5 MINUTES AS NEEDED FOR CHEST PAIN.  DO NOT EXCEED A TOTAL OF 3 DOSES IN 15 MINUTES   Yes Minus Breeding, MD  propranolol (INDERAL) 40 MG tablet Take 20 mg by mouth 2 (two) times daily.    Yes Historical Provider, MD  psyllium (METAMUCIL SMOOTH TEXTURE) 28 % packet Take 1 packet by mouth 2 (two) times daily.   Yes Historical Provider, MD  ranitidine (ZANTAC) 300 MG tablet  01/21/14  Yes Historical Provider, MD  tiotropium (SPIRIVA) 18 MCG inhalation capsule Place 1 capsule (18 mcg total) into inhaler and inhale daily. 02/03/13  Yes Kathie Dike, MD  traMADol (ULTRAM) 50 MG tablet Take 50 mg by mouth every 6 (six) hours as needed.  01/25/14  Yes Historical Provider, MD  sodium chloride (OCEAN) 0.65 % SOLN nasal spray Place 1 spray into both nostrils as needed for congestion.    Historical Provider, MD    Family History  Family History  Problem Relation Age of Onset  . Glaucoma Father   . Heart attack Father   . Bone cancer Mother     Family Status  Relation Status Death Age  . Father Deceased   . Mother Deceased 42     Social History  History   Social History  . Marital Status: Widowed    Spouse Name: N/A    Number of Children: 4  . Years of Education: N/A   Occupational History  . retired    Social History Main Topics  . Smoking status: Current Every Day Smoker -- 0.50 packs/day for 55 years    Types: Cigarettes  . Smokeless tobacco: Former Systems developer    Types: Snuff  . Alcohol Use: No  . Drug Use: No  .  Sexual Activity: No     Comment: 1 cig to 1/4 of a pack a day   Other Topics Concern  . Not on file   Social History Narrative     All other systems reviewed and are otherwise negative except as noted above.  Physical Exam  Blood pressure 155/93, pulse 77, temperature 97.7 F (36.5 C), temperature source Oral, resp. rate 20, height 5\' 5"  (1.651 m), weight 174 lb (78.926 kg), SpO2 96 %.  General: Pleasant, NAD. chronically ill appearing. HOH  Psych: Normal affect. Neuro: Alert and oriented X 3. Moves all extremities spontaneously. HEENT: Normal  Neck: Supple without bruits or JVD. Lungs:  Diffuse rhonchi and wheezing.  Heart: RRR no s3, s4, or murmurs. Abdomen: Soft, non-tender, non-distended, BS + x 4.  Extremities: No clubbing, cyanosis or edema. DP/PT/Radials 2+ and equal bilaterally.  Labs  No results for input(s): CKTOTAL, CKMB, TROPONINI in the last 72 hours. Lab Results  Component Value Date   WBC 11.5* 11/28/2014   HGB 15.6* 11/28/2014   HCT 45.5 11/28/2014   MCV 90.6 11/28/2014   PLT 280 11/28/2014    Recent Labs Lab 11/28/14 1049  NA 132*  K 3.4*  CL 98  CO2 28  BUN 9  CREATININE 1.08  CALCIUM 8.9  PROT 6.5  BILITOT 0.4  ALKPHOS 56  ALT 14  AST 20  GLUCOSE 128*       Radiology/Studies  Dg Chest 2 View  11/28/2014   CLINICAL DATA:  Diffuse pain.  Nausea.  Symptoms for 2 days.  EXAM: CHEST  2 VIEW  COMPARISON:  CT chest and Single view of the chest  01/30/2013.  FINDINGS: Single view of the chest demonstrates clear lungs without edema or consolidative process the chest is hyperexpanded. There is cardiomegaly. No pneumothorax or pleural effusion is seen. There is partial visualization of spinal fusion hardware.  IMPRESSION: No acute disease.  Cardiomegaly and emphysema.   Electronically Signed   By: Inge Rise M.D.   On: 11/28/2014 11:47   Ct Abdomen Pelvis W Contrast  11/23/2014   CLINICAL DATA:  Generalized abdominal pain for several months.  EXAM: CT ABDOMEN AND PELVIS WITH CONTRAST  TECHNIQUE: Multidetector CT imaging of the abdomen and pelvis was performed using the standard protocol following bolus administration of intravenous contrast.  CONTRAST:  12mL OMNIPAQUE IOHEXOL 300 MG/ML  SOLN  COMPARISON:  CT scan of January 30, 2013.  FINDINGS: Status post surgical posterior fusion of the lumbar spine. Visualized lung bases appear normal.  No gallstones are noted. The liver, spleen and pancreas appear normal. Adrenal glands appear normal. No hydronephrosis or renal obstruction is noted. No renal or ureteral calculi are noted. Bilateral cortical scarring of the kidneys is noted. Bilateral stable simple cysts are noted. Atherosclerotic calcifications of abdominal aorta and iliac arteries are noted without aneurysm formation.  There is no evidence of bowel obstruction. Sigmoid diverticulosis is noted without inflammation. No abnormal fluid collection is noted. Status post hysterectomy. Urinary bladder appears normal. Ovaries appear normal. No significant adenopathy is noted.  IMPRESSION: Sigmoid diverticulosis is noted without inflammation.  Cortical scarring of both kidneys is noted. No hydronephrosis or renal obstruction is noted. Stable bilateral renal cysts.  No acute abnormality seen in the abdomen or pelvis.   Electronically Signed   By: Sabino Dick M.D.   On: 11/23/2014 16:57   Dg Abd 2 Views  11/28/2014   CLINICAL DATA:  Diffuse abdominal pain  and nausea for 2 days.  EXAM: ABDOMEN - 2 VIEW  COMPARISON:  CT abdomen pelvis 11/23/2014.  FINDINGS: Fair amount of stool is seen in the colon. Mild gaseous prominence of small bowel in association. No free air. Lung bases are unremarkable. Postoperative changes are seen in the lumbar spine.  IMPRESSION: Probable constipation.   Electronically Signed   By: Lorin Picket M.D.   On: 11/28/2014 11:46    ECG HR 57 Sinus rhythm RSR' in V1 or V2, right VCD or RVH No significant change since last tracing  [place] is a 80 y.o. female with a history of CAD s/p multiple stents, GERD, HLD, HTN, continued tobacco abuse and COPD who presents to [facility] today with chest pain and abdominal pain.    Chest pain- constant atypical chest pain since evaluation by Dr. Percival Spanish in 06/2014 -- Troponin neg x1. ECG w/ no acute ST or TW changes. -- Negative nuclear stress test 07/2013.  -- Will cycle cardiac enzymes and plan for nuclear stress test in the AM.  -- Continue BB and imdur. Intolerant to statins. Will not anticoagulate unless troponin returns positive.  Abdominal pain- recent CTA abdomen negative for acute abnormality on 11/23/14. Ordered by PCP, Dr. Arelia Sneddon  -- Abdominal Xray in ED with probable consipation  COPD- wheezing and rhonchi on exam.  -- CXR with Cardiomegaly and emphysema. -- Continue home meds.   Mild leukocytosis with left shift- WBC 11.5, 79% neutrophils  We will just consult on this patient.   Judy Pimple, PA-C 11/28/2014, 3:17 PM  Pager [phone]  Personally seen and examined. Agree with above.  80 year old with known CAD, last stent to Cx here with atypical CP (left breast with multiple somatic complaints).  -Will cycle enzymes -NPO for NUC stress tomorrow if troponin is normal.  -Challenging to decipher symptoms but we have to respect her underlying CAD. -COPD per primary team. (Wheeze noted).

## 2014-11-28 NOTE — ED Notes (Signed)
Re-paged cards  

## 2014-11-28 NOTE — ED Notes (Signed)
Received pt from home with c/o EMS called [facility] for chest tightness and abdominal pain with shortness of breath. Pt seen by [facility] heartcare last week and was taken off HCTZ, pt feels she has fluid build up now. Pt given albuterol and atrovent neb treatment by EMS along with 125 mg of solumedrol, IV.

## 2014-11-28 NOTE — ED Notes (Signed)
[name]- lab called to add on differential- sts is able too.

## 2014-11-28 NOTE — ED Notes (Signed)
Pharmacy sts is sending enema now.

## 2014-11-29 ENCOUNTER — Observation Stay (HOSPITAL_COMMUNITY): Payer: Medicare Other

## 2014-11-29 DIAGNOSIS — R079 Chest pain, unspecified: Secondary | ICD-10-CM

## 2014-11-29 LAB — CBC
HEMATOCRIT: 44.1 % (ref 36.0–46.0)
Hemoglobin: 15.2 g/dL — ABNORMAL HIGH (ref 12.0–15.0)
MCH: 31.7 pg (ref 26.0–34.0)
MCHC: 34.5 g/dL (ref 30.0–36.0)
MCV: 92.1 fL (ref 78.0–100.0)
PLATELETS: 256 10*3/uL (ref 150–400)
RBC: 4.79 MIL/uL (ref 3.87–5.11)
RDW: 12.4 % (ref 11.5–15.5)
WBC: 14.5 10*3/uL — AB (ref 4.0–10.5)

## 2014-11-29 LAB — LIPID PANEL
CHOLESTEROL: 165 mg/dL (ref 0–200)
HDL: 54 mg/dL (ref >39)
LDL CALC: 101 mg/dL — AB (ref 0–99)
TRIGLYCERIDES: 52 mg/dL (ref <150)
Total CHOL/HDL Ratio: 3.1 RATIO
VLDL: 10 mg/dL (ref 0–40)

## 2014-11-29 LAB — BASIC METABOLIC PANEL
Anion gap: 10 (ref 5–15)
BUN: 17 mg/dL (ref 6–23)
CHLORIDE: 96 meq/L (ref 96–112)
CO2: 26 mmol/L (ref 19–32)
CREATININE: 0.95 mg/dL (ref 0.50–1.10)
Calcium: 9.4 mg/dL (ref 8.4–10.5)
GFR calc Af Amer: 65 mL/min — ABNORMAL LOW (ref >90)
GFR calc non Af Amer: 56 mL/min — ABNORMAL LOW (ref >90)
Glucose, Bld: 131 mg/dL — ABNORMAL HIGH (ref 70–99)
Potassium: 3.9 mmol/L (ref 3.5–5.1)
Sodium: 132 mmol/L — ABNORMAL LOW (ref 135–145)

## 2014-11-29 LAB — HEMOGLOBIN A1C
HEMOGLOBIN A1C: 6.2 % — AB (ref <5.7)
Mean Plasma Glucose: 131 mg/dL — ABNORMAL HIGH (ref <117)

## 2014-11-29 LAB — TROPONIN I: Troponin I: <0.03 ng/mL (ref <0.031)

## 2014-11-29 MED ORDER — TECHNETIUM TC 99M SESTAMIBI GENERIC - CARDIOLITE
30.0000 | Freq: Once | INTRAVENOUS | Status: AC | PRN
  Administered 2014-11-29: 30 via INTRAVENOUS

## 2014-11-29 MED ORDER — IPRATROPIUM-ALBUTEROL 0.5-2.5 (3) MG/3ML IN SOLN
3.0000 mL | RESPIRATORY_TRACT | Status: DC | PRN
  Administered 2014-11-29: 3 mL via RESPIRATORY_TRACT
  Filled 2014-11-29: qty 3

## 2014-11-29 MED ORDER — SODIUM CHLORIDE 0.9 % IJ SOLN
80.0000 mg | INTRAVENOUS | Status: AC
  Administered 2014-11-29: 80 mg via INTRAVENOUS
  Filled 2014-11-29: qty 3.2

## 2014-11-29 MED ORDER — REGADENOSON 0.4 MG/5ML IV SOLN
INTRAVENOUS | Status: AC
  Administered 2014-11-29: 0.4 mg via INTRAVENOUS
  Filled 2014-11-29: qty 5

## 2014-11-29 MED ORDER — TECHNETIUM TC 99M SESTAMIBI GENERIC - CARDIOLITE
10.0000 | Freq: Once | INTRAVENOUS | Status: AC | PRN
  Administered 2014-11-29: 10 via INTRAVENOUS

## 2014-11-29 MED ORDER — REGADENOSON 0.4 MG/5ML IV SOLN: 0.4000 mg | Freq: Once | INTRAVENOUS | Status: DC

## 2014-11-29 NOTE — Progress Notes (Signed)
Exercise oximetry completed.  Oxygen saturation at rest was 97% on room air. Patient walked 200 feet into the hall and oxygen saturation dropped to 87% with mild shortness of breath. Patient return to bed and oxygen saturation increased to 98% on room air.

## 2014-11-29 NOTE — Progress Notes (Signed)
Gave duoneb prior to [facility] due to increased wheezes and cough.  Lexiscan completed without complications.  She did have increase of her ongoing chest pain.  + SOB and headache all resolved in recovery after aminophylline.  nuc results  To follow.

## 2014-11-29 NOTE — Progress Notes (Signed)
PT Cancellation Note  Patient Details Name: DHARMA PARE MRN: [id number] DOB: Aug 01, 1935   Cancelled Treatment:    Reason Eval/Treat Not Completed: Patient at procedure or test/unavailable (In Nuclear med all am. Will return as able.  )   Irwin Brakeman F 11/29/2014, 11:25 AM  [facility] [phone] (pager)

## 2014-11-29 NOTE — Progress Notes (Addendum)
Patient nauseated but unable to give zofran until 0830 per orders. Spoke with Dr. Eliseo Squires who gave orders to give zofran early.

## 2014-11-29 NOTE — Progress Notes (Signed)
Lexiscan stress test completed - Aminophylline given as a reversal agent due to pt complaining of HA/SOB - which resolved.  Pt tolerated test after duoneb - no active wheezing noted now.  Had to restart IV again as pt pulled it out accidentally.

## 2014-11-29 NOTE — Progress Notes (Signed)
Pt wheezing - will give duoneb prior to stress test per Cecilie Kicks, NP.  Called RX and requested a duoneb be tubed to radiology.

## 2014-11-29 NOTE — Progress Notes (Addendum)
Patient Name: [name] Date of Encounter: 11/29/2014     Active Problems:   Coronary atherosclerosis   Hyperlipemia   HTN (hypertension)   COPD with acute exacerbation   Abdominal pain   Constipation   Atypical chest pain   Chest pain   Tobacco abuse    SUBJECTIVE  States she continue to have chest pain across her chest from L side to the R side to the abdomen.  No significant SOB.   CURRENT MEDS . aspirin EC  325 mg Oral Daily  . cholecalciferol  1,000 Units Oral Daily  . enoxaparin (LOVENOX) injection  40 mg Subcutaneous Q24H  . famotidine  40 mg Oral QHS  . guaiFENesin  600 mg Oral BID  . hydrochlorothiazide  25 mg Oral Daily  . isosorbide mononitrate  30 mg Oral Daily  . lactulose  30 g Oral BID  . methylPREDNISolone (SOLU-MEDROL) injection  60 mg Intravenous Once  . nicotine  14 mg Transdermal Daily  . polyethylene glycol  17 g Oral Daily  . potassium chloride  40 mEq Oral Once  . predniSONE  40 mg Oral Q breakfast  . propranolol  20 mg Oral BID  . senna-docusate  1 tablet Oral BID  . sodium chloride  3 mL Intravenous Q12H    OBJECTIVE  Filed Vitals:   11/28/14 2047 11/29/14 0210 11/29/14 0500 11/29/14 0650  BP: 172/71 152/88 172/78 158/73  Pulse: 87  70   Temp: 98.1 F (36.7 C)  98.3 F (36.8 C)   TempSrc: Oral  Oral   Resp: 18  18   Height: 5\' 4"  (1.626 m)     Weight: 176 lb (79.833 kg)  179 lb 8 oz (81.421 kg)   SpO2: 96% 96% 94%     Intake/Output Summary (Last 24 hours) at 11/29/14 0747 Last data filed at 11/29/14 0030  Gross per 24 hour  Intake      0 ml  Output    450 ml  Net   -450 ml   Filed Weights   11/28/14 1026 11/28/14 2047 11/29/14 0500  Weight: 174 lb (78.926 kg) 176 lb (79.833 kg) 179 lb 8 oz (81.421 kg)    PHYSICAL EXAM  General: Pleasant, NAD. Neuro: Alert and oriented X 3. Moves all extremities spontaneously. Psych: Normal affect. HEENT:  Normal  Neck: Supple without bruits or JVD. Lungs:  Resp regular and  unlabored. Mild decrease breath sound with occasional wheezing. Heart: RRR no s3, s4, or murmurs. Abdomen: Soft, non-tender, non-distended, BS + x 4.  Extremities: No clubbing, cyanosis or edema. DP/PT/Radials 2+ and equal bilaterally.  Accessory Clinical Findings  CBC  Recent Labs  11/28/14 1049 11/29/14 0432  WBC 11.5* 14.5*  NEUTROABS 9.0*  --   HGB 15.6* 15.2*  HCT 45.5 44.1  MCV 90.6 92.1  PLT 280 585   Basic Metabolic Panel  Recent Labs  11/28/14 1049 11/29/14 0432  NA 132* 132*  K 3.4* 3.9  CL 98 96  CO2 28 26  GLUCOSE 128* 131*  BUN 9 17  CREATININE 1.08 0.95  CALCIUM 8.9 9.4   Liver Function Tests  Recent Labs  11/28/14 1049  AST 20  ALT 14  ALKPHOS 56  BILITOT 0.4  PROT 6.5  ALBUMIN 3.8    Recent Labs  11/28/14 1049  LIPASE 36   Cardiac Enzymes  Recent Labs  11/28/14 1741 11/28/14 2310  TROPONINI <0.03 <0.03   Fasting Lipid Panel  Recent Labs  11/29/14 0432  CHOL 165  HDL 54  LDLCALC 101*  TRIG 52  CHOLHDL 3.1    TELE NSR with HR 60-70s    ECG  No new EKG   Radiology/Studies  Dg Chest 2 View  11/28/2014   CLINICAL DATA:  Diffuse pain.  Nausea.  Symptoms for 2 days.  EXAM: CHEST  2 VIEW  COMPARISON:  CT chest and Single view of the chest 01/30/2013.  FINDINGS: Single view of the chest demonstrates clear lungs without edema or consolidative process the chest is hyperexpanded. There is cardiomegaly. No pneumothorax or pleural effusion is seen. There is partial visualization of spinal fusion hardware.  IMPRESSION: No acute disease.  Cardiomegaly and emphysema.   Electronically Signed   By: Inge Rise M.D.   On: 11/28/2014 11:47   Ct Abdomen Pelvis W Contrast  11/23/2014   CLINICAL DATA:  Generalized abdominal pain for several months.  EXAM: CT ABDOMEN AND PELVIS WITH CONTRAST  TECHNIQUE: Multidetector CT imaging of the abdomen and pelvis was performed using the standard protocol following bolus administration of  intravenous contrast.  CONTRAST:  162mL OMNIPAQUE IOHEXOL 300 MG/ML  SOLN  COMPARISON:  CT scan of January 30, 2013.  FINDINGS: Status post surgical posterior fusion of the lumbar spine. Visualized lung bases appear normal.  No gallstones are noted. The liver, spleen and pancreas appear normal. Adrenal glands appear normal. No hydronephrosis or renal obstruction is noted. No renal or ureteral calculi are noted. Bilateral cortical scarring of the kidneys is noted. Bilateral stable simple cysts are noted. Atherosclerotic calcifications of abdominal aorta and iliac arteries are noted without aneurysm formation.  There is no evidence of bowel obstruction. Sigmoid diverticulosis is noted without inflammation. No abnormal fluid collection is noted. Status post hysterectomy. Urinary bladder appears normal. Ovaries appear normal. No significant adenopathy is noted.  IMPRESSION: Sigmoid diverticulosis is noted without inflammation.  Cortical scarring of both kidneys is noted. No hydronephrosis or renal obstruction is noted. Stable bilateral renal cysts.  No acute abnormality seen in the abdomen or pelvis.   Electronically Signed   By: Sabino Dick M.D.   On: 11/23/2014 16:57   Dg Abd 2 Views  11/28/2014   CLINICAL DATA:  Diffuse abdominal pain and nausea for 2 days.  EXAM: ABDOMEN - 2 VIEW  COMPARISON:  CT abdomen pelvis 11/23/2014.  FINDINGS: Fair amount of stool is seen in the colon. Mild gaseous prominence of small bowel in association. No free air. Lung bases are unremarkable. Postoperative changes are seen in the lumbar spine.  IMPRESSION: Probable constipation.   Electronically Signed   By: Lorin Picket M.D.   On: 11/28/2014 11:46    ASSESSMENT AND PLAN  [name] is a 80 y.o. female with a history of CAD s/p multiple stents, GERD, HLD, HTN, continued tobacco abuse and COPD who presents to [facility] today with chest pain and abdominal pain.   1. Persistent CP: unclear etiology  - serial trop negative,  symptom persistent since last Aug  - plan to [facility] today  2. Abdominal pain: recent CT of abdomen 11/23/2014 neg for acute process  3. COPD  4. Mild leukocytosys: increasing WBC, ?if the steroid was given yesterday  5. CAD s/p multiple stents  - last cath 04/2012 3v CAD with patent stent in LAD, diagonal, RCA, severe stenosis in prox LCx treated with DES  Signed, Almyra Deforest PA-C Pager: [phone]  Personally seen and examined. Agree with above. NUC stress low risk, no ischemia, normal  EF.  Will sign off.  Candee Furbish, MD

## 2014-11-29 NOTE — Progress Notes (Signed)
PROGRESS NOTE  [name] JJH:[id number] DOB: Feb 16, 1935 DOA: 11/28/2014 PCP: Leonard Downing, MD  Assessment/Plan: Chest pain S/p stress test.  COPD exacerbation Likely due to ongoing tobacco use. Patient counseled on smoking cessation. -prednisone 40 mg daily.  -home O2 eval  Abdominal pain Likely due to constipation. Recent CT of abdomen and pelvis negative. Abdominal x-ray shows findings suggestive of constipation. -enema -lactulose  Uncontrolled hypertension Stable. Continue home antihypertensive medications. When necessary hydralazine for systolic blood pressure greater than 160.  Hyperlipidemia Check lipid panel in the morning.  Tobacco abuse Counseled on cessation. Nicotine patch prescribed.  PT eval  Code Status: full Family Communication: patient Disposition Plan:    Consultants:  cards  Procedures:  Nuclear stress test    HPI/Subjective: Asking about going home  Objective: Filed Vitals:   11/29/14 1410  BP: 152/76  Pulse: 67  Temp: 98 F (36.7 C)  Resp: 16    Intake/Output Summary (Last 24 hours) at 11/29/14 1421 Last data filed at 11/29/14 1306  Gross per 24 hour  Intake      3 ml  Output    575 ml  Net   -572 ml   Filed Weights   11/28/14 1026 11/28/14 2047 11/29/14 0500  Weight: 78.926 kg (174 lb) 79.833 kg (176 lb) 81.421 kg (179 lb 8 oz)    Exam:   General:  A+Ox3, NAD  Cardiovascular: rrr  Respiratory: no moving much air, no wheezing but tight  Abdomen: +BS, soft  Musculoskeletal: no edema   Data Reviewed: Basic Metabolic Panel:  Recent Labs Lab 11/28/14 1049 11/29/14 0432  NA 132* 132*  K 3.4* 3.9  CL 98 96  CO2 28 26  GLUCOSE 128* 131*  BUN 9 17  CREATININE 1.08 0.95  CALCIUM 8.9 9.4   Liver Function Tests:  Recent Labs Lab 11/28/14 1049  AST 20  ALT 14  ALKPHOS 56  BILITOT 0.4  PROT 6.5  ALBUMIN 3.8    Recent Labs Lab 11/28/14 1049  LIPASE 36   No results for  input(s): AMMONIA in the last 168 hours. CBC:  Recent Labs Lab 11/28/14 1049 11/29/14 0432  WBC 11.5* 14.5*  NEUTROABS 9.0*  --   HGB 15.6* 15.2*  HCT 45.5 44.1  MCV 90.6 92.1  PLT 280 256   Cardiac Enzymes:  Recent Labs Lab 11/28/14 1741 11/28/14 2310  TROPONINI <0.03 <0.03   BNP (last 3 results) No results for input(s): PROBNP in the last 8760 hours. CBG: No results for input(s): GLUCAP in the last 168 hours.  No results found for this or any previous visit (from the past 240 hour(s)).   Studies: Dg Chest 2 View  11/28/2014   CLINICAL DATA:  Diffuse pain.  Nausea.  Symptoms for 2 days.  EXAM: CHEST  2 VIEW  COMPARISON:  CT chest and Single view of the chest 01/30/2013.  FINDINGS: Single view of the chest demonstrates clear lungs without edema or consolidative process the chest is hyperexpanded. There is cardiomegaly. No pneumothorax or pleural effusion is seen. There is partial visualization of spinal fusion hardware.  IMPRESSION: No acute disease.  Cardiomegaly and emphysema.   Electronically Signed   By: Inge Rise M.D.   On: 11/28/2014 11:47   Dg Abd 2 Views  11/28/2014   CLINICAL DATA:  Diffuse abdominal pain and nausea for 2 days.  EXAM: ABDOMEN - 2 VIEW  COMPARISON:  CT abdomen pelvis 11/23/2014.  FINDINGS: Fair amount of stool is seen in the  colon. Mild gaseous prominence of small bowel in association. No free air. Lung bases are unremarkable. Postoperative changes are seen in the lumbar spine.  IMPRESSION: Probable constipation.   Electronically Signed   By: Lorin Picket M.D.   On: 11/28/2014 11:46    Scheduled Meds: . aspirin EC  325 mg Oral Daily  . cholecalciferol  1,000 Units Oral Daily  . enoxaparin (LOVENOX) injection  40 mg Subcutaneous Q24H  . famotidine  40 mg Oral QHS  . guaiFENesin  600 mg Oral BID  . hydrochlorothiazide  25 mg Oral Daily  . isosorbide mononitrate  30 mg Oral Daily  . lactulose  30 g Oral BID  . methylPREDNISolone  (SOLU-MEDROL) injection  60 mg Intravenous Once  . nicotine  14 mg Transdermal Daily  . polyethylene glycol  17 g Oral Daily  . potassium chloride  40 mEq Oral Once  . predniSONE  40 mg Oral Q breakfast  . propranolol  20 mg Oral BID  . regadenoson  0.4 mg Intravenous Once  . senna-docusate  1 tablet Oral BID  . sodium chloride  3 mL Intravenous Q12H   Continuous Infusions:  Antibiotics Given (last 72 hours)    None      Active Problems:   Coronary atherosclerosis   Hyperlipemia   HTN (hypertension)   COPD with acute exacerbation   Abdominal pain   Constipation   Atypical chest pain   Chest pain   Tobacco abuse    Time spent: 25 min    [name]  [facility] Pager [phone]. If 7PM-7AM, please contact night-coverage at [email or web address], password [facility] 11/29/2014, 2:21 PM  LOS: 1 day

## 2014-11-29 NOTE — Progress Notes (Signed)
Nuclear stress test: IMPRESSION: Normal stress nuclear study with chest pain but no electrocardiographic changes. The scintigraphic results show no evidence of ischemia or infarction in any vascular territory. The gated ejection fraction was 81% and the wall motion was normal.  I notified pt of normal stress test.

## 2014-11-29 NOTE — Progress Notes (Signed)
Pt had a good response to duoneb, decreased wheezing/sob.  We are progressing with the Lexiscan stress test.  Had to restart her IV due to rt forearms site leaking at site and she pulled out the one placed to her left hand - restarted to same site - left hand for stress test.

## 2014-11-30 DIAGNOSIS — R079 Chest pain, unspecified: Secondary | ICD-10-CM | POA: Diagnosis not present

## 2014-11-30 LAB — BASIC METABOLIC PANEL
ANION GAP: 6 (ref 5–15)
BUN: 20 mg/dL (ref 6–23)
CALCIUM: 9.1 mg/dL (ref 8.4–10.5)
CO2: 30 mmol/L (ref 19–32)
Chloride: 95 mEq/L — ABNORMAL LOW (ref 96–112)
Creatinine, Ser: 1.14 mg/dL — ABNORMAL HIGH (ref 0.50–1.10)
GFR, EST AFRICAN AMERICAN: 52 mL/min — AB (ref >90)
GFR, EST NON AFRICAN AMERICAN: 45 mL/min — AB (ref >90)
Glucose, Bld: 94 mg/dL (ref 70–99)
Potassium: 4.3 mmol/L (ref 3.5–5.1)
SODIUM: 131 mmol/L — AB (ref 135–145)

## 2014-11-30 LAB — CBC
HEMATOCRIT: 44 % (ref 36.0–46.0)
HEMOGLOBIN: 14.7 g/dL (ref 12.0–15.0)
MCH: 30.9 pg (ref 26.0–34.0)
MCHC: 33.4 g/dL (ref 30.0–36.0)
MCV: 92.6 fL (ref 78.0–100.0)
PLATELETS: 265 10*3/uL (ref 150–400)
RBC: 4.75 MIL/uL (ref 3.87–5.11)
RDW: 12.7 % (ref 11.5–15.5)
WBC: 12.7 10*3/uL — ABNORMAL HIGH (ref 4.0–10.5)

## 2014-11-30 MED ORDER — AMLODIPINE BESYLATE 5 MG PO TABS
5.0000 mg | ORAL_TABLET | Freq: Once | ORAL | Status: AC
  Administered 2014-11-30: 5 mg via ORAL
  Filled 2014-11-30: qty 1

## 2014-11-30 MED ORDER — HYDRALAZINE HCL 25 MG PO TABS
25.0000 mg | ORAL_TABLET | Freq: Three times a day (TID) | ORAL | Status: DC
  Filled 2014-11-30: qty 1

## 2014-11-30 MED ORDER — TIOTROPIUM BROMIDE MONOHYDRATE 18 MCG IN CAPS
18.0000 ug | ORAL_CAPSULE | Freq: Every day | RESPIRATORY_TRACT | Status: DC
  Administered 2014-11-30: 18 ug via RESPIRATORY_TRACT
  Filled 2014-11-30: qty 5

## 2014-11-30 MED ORDER — AMLODIPINE BESYLATE 5 MG PO TABS
5.0000 mg | ORAL_TABLET | Freq: Every day | ORAL | Status: DC
  Administered 2014-11-30: 5 mg via ORAL
  Filled 2014-11-30: qty 1

## 2014-11-30 MED ORDER — AMLODIPINE BESYLATE 10 MG PO TABS
10.0000 mg | ORAL_TABLET | Freq: Every day | ORAL | Status: DC
  Administered 2014-12-01: 10 mg via ORAL
  Filled 2014-11-30: qty 1

## 2014-11-30 NOTE — Progress Notes (Signed)
PROGRESS NOTE  [name] ASN:[id number] DOB: 1935-07-29 DOA: 11/28/2014 PCP: Leonard Downing, MD  Assessment/Plan: Chest pain S/p stress test.  COPD exacerbation with acute resp failure- qualifies for home O2 Likely due to ongoing tobacco use. Patient counseled on smoking cessation. -prednisone 40 mg daily.  -home O2 eval- qualifies but still smoking- spoke at length about dangers of O2 and smoking  Abdominal pain- now with diarrhea D/c all meds  Uncontrolled hypertension Add norvasc.  Hyperlipidemia LDL 101  Tobacco abuse Counseled on cessation. Nicotine patch prescribed.  PT eval- home health  Code Status: full Family Communication: patient Disposition Plan: home in AM or later tonight   Consultants:  cards  Procedures:  Nuclear stress test    HPI/Subjective: On toilet with diarrhea  Objective: Filed Vitals:   11/30/14 1254  BP: 140/84  Pulse:   Temp:   Resp:     Intake/Output Summary (Last 24 hours) at 11/30/14 1419 Last data filed at 11/30/14 1012  Gross per 24 hour  Intake    243 ml  Output    552 ml  Net   -309 ml   Filed Weights   11/28/14 2047 11/29/14 0500 11/30/14 0500  Weight: 79.833 kg (176 lb) 81.421 kg (179 lb 8 oz) 79.379 kg (175 lb)    Exam:   General:  A+Ox3, NAD  Cardiovascular: rrr  Respiratory: no moving much air, no wheezing but tight  Abdomen: +BS, soft  Musculoskeletal: no edema   Data Reviewed: Basic Metabolic Panel:  Recent Labs Lab 11/28/14 1049 11/29/14 0432 11/30/14 0235  NA 132* 132* 131*  K 3.4* 3.9 4.3  CL 98 96 95*  CO2 28 26 30   GLUCOSE 128* 131* 94  BUN 9 17 20   CREATININE 1.08 0.95 1.14*  CALCIUM 8.9 9.4 9.1   Liver Function Tests:  Recent Labs Lab 11/28/14 1049  AST 20  ALT 14  ALKPHOS 56  BILITOT 0.4  PROT 6.5  ALBUMIN 3.8    Recent Labs Lab 11/28/14 1049  LIPASE 36   No results for input(s): AMMONIA in the last 168 hours. CBC:  Recent Labs Lab  11/28/14 1049 11/29/14 0432 11/30/14 0235  WBC 11.5* 14.5* 12.7*  NEUTROABS 9.0*  --   --   HGB 15.6* 15.2* 14.7  HCT 45.5 44.1 44.0  MCV 90.6 92.1 92.6  PLT 280 256 265   Cardiac Enzymes:  Recent Labs Lab 11/28/14 1741 11/28/14 2310  TROPONINI <0.03 <0.03   BNP (last 3 results) No results for input(s): PROBNP in the last 8760 hours. CBG: No results for input(s): GLUCAP in the last 168 hours.  No results found for this or any previous visit (from the past 240 hour(s)).   Studies: Nm Myocar Multi W/spect W/wall Motion / Ef  11/29/2014   CLINICAL DATA:  Chest pain  EXAM: Lexiscan Myovue  TECHNIQUE: The patient received IV Lexiscan .4mg  over 15 seconds. 33.0 mCi of Technetium 55m Sestamibi injected at 30 seconds. Quantitative SPECT images were obtained in the vertical, horizontal and short axis planes after a 45 minute delay. Rest images were obtained with similar planes and delay using 10.2 mCi of Technetium 55m Sestamibi.  FINDINGS: ECG: NSR, RVCD. Patient's resting blood pressure was 186/90 and following infusion 165/72. Heart rate at rest 71 and following infusion 79. No ST changes with confusion.  Symptoms:  The patient did complain of chest pain during the study.  RAW Data:  Image acquisition technically adequate.  Quantitiative Gated SPECT EF: 81%.  Wall motion is normal. TID- 1.15. The end-diastolic volume 60 mL. End systolic volume 12 mL.  Perfusion Images: There is normal perfusion on the stress and rest images. No ischemia or infarction.  IMPRESSION: Normal stress nuclear study with chest pain but no electrocardiographic changes. The scintigraphic results show no evidence of ischemia or infarction in any vascular territory. The gated ejection fraction was 81% and the wall motion was normal.  Kirk Ruths   Electronically Signed   By: Kirk Ruths   On: 11/29/2014 17:26    Scheduled Meds: . Derrill Memo ON 12/01/2014] amLODipine  10 mg Oral Daily  . aspirin EC  325 mg Oral Daily    . cholecalciferol  1,000 Units Oral Daily  . enoxaparin (LOVENOX) injection  40 mg Subcutaneous Q24H  . famotidine  40 mg Oral QHS  . guaiFENesin  600 mg Oral BID  . isosorbide mononitrate  30 mg Oral Daily  . nicotine  14 mg Transdermal Daily  . predniSONE  40 mg Oral Q breakfast  . propranolol  20 mg Oral BID  . regadenoson  0.4 mg Intravenous Once  . senna-docusate  1 tablet Oral BID  . sodium chloride  3 mL Intravenous Q12H  . tiotropium  18 mcg Inhalation Daily   Continuous Infusions:  Antibiotics Given (last 72 hours)    None      Active Problems:   Coronary atherosclerosis   Hyperlipemia   HTN (hypertension)   COPD with acute exacerbation   Abdominal pain   Constipation   Atypical chest pain   Chest pain   Tobacco abuse    Time spent: 25 min    [name], [facility] Pager [phone]. If 7PM-7AM, please contact night-coverage at [email or web address], password [facility] 11/30/2014, 2:19 PM  LOS: 2 days

## 2014-11-30 NOTE — Evaluation (Signed)
Physical Therapy Evaluation Patient Details Name: [name] MRN: [id number] DOB: 01/26/1935 Today's Date: 11/30/2014   History of Present Illness    [name] is a 80 y.o. female with history of hypertension, COPD, CAD, presents to ED with complaint of chest pain and abdominal pain.   Clinical Impression  Pt demonstrating safe bed mobility and transfers. Pt safe with ambulation without AD but had SpO2 drop to 87% on RA during ambulation. Pt able to increase SpO2 to 90% with standing rest breaks and breathing exercises. Pt educated on pursed lip breathing technique when she is feeling SOB. Pt will continue to benefit from acute PT focusing on safe ambulation, endurance, activity tolerance and stairs.     Follow Up Recommendations Home health PT;Supervision - Intermittent    Equipment Recommendations  None recommended by PT    Recommendations for Other Services       Precautions / Restrictions Precautions Precautions: Fall Restrictions Weight Bearing Restrictions: No      Mobility  Bed Mobility Overal bed mobility: Modified Independent             General bed mobility comments: Pt required increased time to perform transition but did not need physical assistance or cuing. Pt safe with transition to EOB.  Transfers Overall transfer level: Modified independent Equipment used: None             General transfer comment: Pt able to safely stand from lowered bed x2 without assistance or cuing. Pt safe with transfer and had no instability or loss of balance.   Ambulation/Gait Ambulation/Gait assistance: Supervision Ambulation Distance (Feet): 200 Feet Assistive device: None Gait Pattern/deviations: Step-through pattern;Decreased step length - right;Decreased step length - left;Decreased stride length   Gait velocity interpretation: Below normal speed for age/gender General Gait Details: Pt able to ambulate down to nurse station and back without AD and with no  loss of balance or instability. Pt SpO2 dropped to as low as 87% during ambulation and pt able to stand and catch her breath to increased back to 90%.   Stairs            Wheelchair Mobility    Modified Rankin (Stroke Patients Only)       Balance Overall balance assessment: Needs assistance         Standing balance support: No upper extremity supported;During functional activity Standing balance-Leahy Scale: Fair Standing balance comment: Pt able to ambulate with supervision and no support. Did not assess ability to accept challenges due to decreased SpO2.                             Pertinent Vitals/Pain Pain Assessment: Faces Faces Pain Scale: Hurts even more Pain Location: Headache Pain Intervention(s): Limited activity within patient's tolerance;Monitored during session    [place] expects to be discharged to:: Private residence Living Arrangements: Other relatives Available Help at Discharge: Family;Available 24 hours/day Type of Home: Mobile home Home Access: Stairs to enter Entrance Stairs-Rails: Right;Left;Can reach both Entrance Stairs-Number of Steps: 5 Home Layout: One level Home Equipment: Walker - 2 wheels;Cane - single point;Bedside commode;Shower seat      Prior Function Level of Independence: Independent         Comments: Pt continues to drive     Hand Dominance   Dominant Hand: Right    Extremity/Trunk Assessment               Lower Extremity  Assessment: Overall WFL for tasks assessed         Communication   Communication: No difficulties;HOH  Cognition Arousal/Alertness: Awake/alert Behavior During Therapy: WFL for tasks assessed/performed Overall Cognitive Status: Within Functional Limits for tasks assessed                      General Comments      Exercises        Assessment/Plan    PT Assessment Patient needs continued PT services  PT Diagnosis Difficulty walking    PT Problem List Decreased strength;Decreased activity tolerance;Decreased balance;Decreased mobility;Decreased knowledge of use of DME;Cardiopulmonary status limiting activity  PT Treatment Interventions DME instruction;Gait training;Stair training;Functional mobility training;Therapeutic activities;Therapeutic exercise;Balance training;Patient/family education   PT Goals (Current goals can be found in the Care Plan section) Acute Rehab PT Goals Patient Stated Goal: Return home PT Goal Formulation: With patient Time For Goal Achievement: 12/14/14 Potential to Achieve Goals: Good    Frequency Min 3X/week   Barriers to discharge        Co-evaluation               End of Session Equipment Utilized During Treatment: Gait belt Activity Tolerance: Patient tolerated treatment well;Patient limited by fatigue Patient left: in bed;with bed alarm set;with nursing/sitter in room;with call bell/phone within reach Nurse Communication: Mobility status         Time: 3893-7342 PT Time Calculation (min) (ACUTE ONLY): 27 min   Charges:   PT Evaluation $Initial PT Evaluation Tier I: 1 Procedure PT Treatments $Gait Training: 8-22 mins   PT G CodesJearld Shines SPT 11/30/2014, 10:46 AM  Jearld Shines, SPT  Acute Rehabilitation [phone]

## 2014-11-30 NOTE — Progress Notes (Signed)
SATURATION QUALIFICATIONS: (This note is used to comply with regulatory documentation for home oxygen)  Patient Saturations on Room Air at Rest = 95%  Patient Saturations on Room Air while Ambulating = 87%  Patient Saturations on 2 Liters of oxygen while Ambulating = 95%  Please briefly explain why patient needs home oxygen: desaturation to <90% when ambulating on room air.

## 2014-11-30 NOTE — Progress Notes (Signed)
UR completed 

## 2014-12-01 ENCOUNTER — Telehealth: Payer: Self-pay | Admitting: Cardiology

## 2014-12-01 DIAGNOSIS — R109 Unspecified abdominal pain: Secondary | ICD-10-CM | POA: Insufficient documentation

## 2014-12-01 DIAGNOSIS — K5909 Other constipation: Secondary | ICD-10-CM

## 2014-12-01 DIAGNOSIS — R079 Chest pain, unspecified: Secondary | ICD-10-CM | POA: Diagnosis not present

## 2014-12-01 MED ORDER — AMLODIPINE BESYLATE 10 MG PO TABS: 10.0000 mg | ORAL_TABLET | Freq: Every day | ORAL | Status: DC

## 2014-12-01 MED ORDER — ASPIRIN 325 MG PO TBEC: 325.0000 mg | DELAYED_RELEASE_TABLET | Freq: Every day | ORAL | Status: DC

## 2014-12-01 MED ORDER — ALBUTEROL SULFATE HFA 108 (90 BASE) MCG/ACT IN AERS: 2.0000 | INHALATION_SPRAY | RESPIRATORY_TRACT | Status: AC | PRN

## 2014-12-01 MED ORDER — SENNOSIDES-DOCUSATE SODIUM 8.6-50 MG PO TABS: 1.0000 | ORAL_TABLET | Freq: Every evening | ORAL | Status: DC | PRN

## 2014-12-01 NOTE — Discharge Summary (Cosign Needed)
Physician Discharge Summary  [name] YKD:[id number] DOB: 1935-01-09 DOA: 11/28/2014  PCP: Leonard Downing, MD  Admit date: 11/28/2014 Discharge date: 12/01/2014  Time spent: greater than 30 minutes  Recommendations for Outpatient Follow-up:  1. Home PT  Discharge Diagnoses:  Active Problems:   Coronary atherosclerosis   Hyperlipemia   HTN (hypertension)   COPD with acute exacerbation   Abdominal pain   Constipation   Atypical chest pain   Chest pain   Tobacco abuse   Discharge Condition: stable  Filed Weights   11/29/14 0500 11/30/14 0500 12/01/14 0457  Weight: 81.421 kg (179 lb 8 oz) 79.379 kg (175 lb) 79.742 kg (175 lb 12.8 oz)    History of present illness:  80 y.o. Caucasian female with history of coronary artery disease status post stents, GERD, hyperlipidemia, hypertension, ongoing tobacco use, constipation, COPD, hard of hearing, and arthritis who presents with the above complaints. Patient reports that she has been having ongoing chest pain for the last 2 years. She indicates the squeezing like sensation starts in her left breast which then radiates to her right breast and left shoulder. She at times has pain down her left arm. Denies any jaw pain. Reports having sweats at times. Over the last 2 days, she has had worsening symptoms. As a result she presented to the emergency department for further evaluation. She was evaluated by cardiology who recommended medical admission. She has been having abdominal pain for the last 2 months. She had a CT of her abdomen and pelvis with contrast on 11/23/2014 which showed sigmoid diverticulosis without inflammation. She denies any recent fevers, chills, headaches or vision changes. She does admit to having some nausea but no vomiting. Admits to having some shortness of breath at times. Reports having some diarrhea after being constipated for some time. She reports having a bowel movement 3-4 times a  week.   Hospital Course:  Chest pain MI ruled out. Nuclear stress test normal  COPD exacerbation with acute resp failure- qualifies for home O2 Likely due to ongoing tobacco use. Patient counseled on smoking cessation. Started on steroids, bronchodilators  Abdominal pain, improved.  Uncontrolled hypertension Add norvasc.  Hyperlipidemia LDL 101  Tobacco abuse Counseled on cessation. Nicotine patch prescribed.   Consultants:  cardiology  Procedures:  none  Discharge Exam: Filed Vitals:   12/01/14 0457  BP: 141/65  Pulse: 60  Temp: 97.7 F (36.5 C)  Resp: 16    General: comfortable Cardiovascular: RRR Respiratory: CTA  Discharge Instructions   Discharge Instructions    Diet - low sodium heart healthy    Complete by:  As directed      Discharge instructions    Complete by:  As directed   Quit smoking     Increase activity slowly    Complete by:  As directed           Current Discharge Medication List    START taking these medications   Details  amLODipine (NORVASC) 10 MG tablet Take 1 tablet (10 mg total) by mouth daily. Qty: 30 tablet, Refills: 1    aspirin EC 325 MG EC tablet Take 1 tablet (325 mg total) by mouth daily. Qty: 30 tablet, Refills: 0    senna-docusate (SENOKOT-S) 8.6-50 MG per tablet Take 1 tablet by mouth at bedtime as needed for mild constipation.      CONTINUE these medications which have CHANGED   Details  albuterol (PROVENTIL HFA;VENTOLIN HFA) 108 (90 BASE) MCG/ACT inhaler Inhale 2 puffs into  the lungs every 4 (four) hours as needed for wheezing. Qty: 1 Inhaler, Refills: 0      CONTINUE these medications which have NOT CHANGED   Details  acetaminophen (TYLENOL) 650 MG CR tablet Take 650 mg by mouth every 8 (eight) hours as needed for pain.    cholecalciferol (VITAMIN D) 1000 UNITS tablet Take 1,000 Units by mouth daily.     guaiFENesin (MUCINEX) 600 MG 12 hr tablet Take 1 tablet (600 mg total) by mouth 2 (two)  times daily. Qty: 14 tablet, Refills: 0    hydrochlorothiazide (HYDRODIURIL) 25 MG tablet Take 25 mg by mouth daily.     isosorbide mononitrate (IMDUR) 30 MG 24 hr tablet TAKE ONE TABLET BY MOUTH EVERY DAY Qty: 30 tablet, Refills: 6    NITROSTAT 0.4 MG SL tablet DISSOLVE ONE TABLET UNDER THE TONGUE EVERY 5 MINUTES AS NEEDED FOR CHEST PAIN.  DO NOT EXCEED A TOTAL OF 3 DOSES IN 15 MINUTES Qty: 25 tablet, Refills: 0    propranolol (INDERAL) 40 MG tablet Take 20 mg by mouth 2 (two) times daily.     psyllium (METAMUCIL SMOOTH TEXTURE) 28 % packet Take 1 packet by mouth 2 (two) times daily.    ranitidine (ZANTAC) 300 MG tablet     tiotropium (SPIRIVA) 18 MCG inhalation capsule Place 1 capsule (18 mcg total) into inhaler and inhale daily. Qty: 30 capsule, Refills: 1    traMADol (ULTRAM) 50 MG tablet Take 50 mg by mouth every 6 (six) hours as needed.     sodium chloride (OCEAN) 0.65 % SOLN nasal spray Place 1 spray into both nostrils as needed for congestion.       Allergies  Allergen Reactions  . Iohexol      Code: HIVES, Desc: PER ROBIN @ [facility], PT IS ALLERGIC TO IVP DYE 08/28/10/RM, Onset Date: 81017510   . Lipitor [Atorvastatin Calcium] Hives    Hives per pt. She states she thinks this is the right medicine   Follow-up Information    Follow up with Leonard Downing, MD.   Specialty:  [facility]   Contact information:   [place] [address] [phone]        The results of significant diagnostics from this hospitalization (including imaging, microbiology, ancillary and laboratory) are listed below for reference.    Significant Diagnostic Studies: Dg Chest 2 View  11/28/2014   CLINICAL DATA:  Diffuse pain.  Nausea.  Symptoms for 2 days.  EXAM: CHEST  2 VIEW  COMPARISON:  CT chest and Single view of the chest 01/30/2013.  FINDINGS: Single view of the chest demonstrates clear lungs without edema or consolidative process the chest is  hyperexpanded. There is cardiomegaly. No pneumothorax or pleural effusion is seen. There is partial visualization of spinal fusion hardware.  IMPRESSION: No acute disease.  Cardiomegaly and emphysema.   Electronically Signed   By: Inge Rise M.D.   On: 11/28/2014 11:47   Ct Abdomen Pelvis W Contrast  11/23/2014   CLINICAL DATA:  Generalized abdominal pain for several months.  EXAM: CT ABDOMEN AND PELVIS WITH CONTRAST  TECHNIQUE: Multidetector CT imaging of the abdomen and pelvis was performed using the standard protocol following bolus administration of intravenous contrast.  CONTRAST:  182mL OMNIPAQUE IOHEXOL 300 MG/ML  SOLN  COMPARISON:  CT scan of January 30, 2013.  FINDINGS: Status post surgical posterior fusion of the lumbar spine. Visualized lung bases appear normal.  No gallstones are noted. The liver, spleen and  pancreas appear normal. Adrenal glands appear normal. No hydronephrosis or renal obstruction is noted. No renal or ureteral calculi are noted. Bilateral cortical scarring of the kidneys is noted. Bilateral stable simple cysts are noted. Atherosclerotic calcifications of abdominal aorta and iliac arteries are noted without aneurysm formation.  There is no evidence of bowel obstruction. Sigmoid diverticulosis is noted without inflammation. No abnormal fluid collection is noted. Status post hysterectomy. Urinary bladder appears normal. Ovaries appear normal. No significant adenopathy is noted.  IMPRESSION: Sigmoid diverticulosis is noted without inflammation.  Cortical scarring of both kidneys is noted. No hydronephrosis or renal obstruction is noted. Stable bilateral renal cysts.  No acute abnormality seen in the abdomen or pelvis.   Electronically Signed   By: Sabino Dick M.D.   On: 11/23/2014 16:57   Nm Myocar Multi W/spect W/wall Motion / Ef  11/29/2014   CLINICAL DATA:  Chest pain  EXAM: Lexiscan Myovue  TECHNIQUE: The patient received IV Lexiscan .4mg  over 15 seconds. 33.0 mCi of  Technetium 66m Sestamibi injected at 30 seconds. Quantitative SPECT images were obtained in the vertical, horizontal and short axis planes after a 45 minute delay. Rest images were obtained with similar planes and delay using 10.2 mCi of Technetium 27m Sestamibi.  FINDINGS: ECG: NSR, RVCD. Patient's resting blood pressure was 186/90 and following infusion 165/72. Heart rate at rest 71 and following infusion 79. No ST changes with confusion.  Symptoms:  The patient did complain of chest pain during the study.  RAW Data:  Image acquisition technically adequate.  Quantitiative Gated SPECT EF: 81%. Wall motion is normal. TID- 1.15. The end-diastolic volume 60 mL. End systolic volume 12 mL.  Perfusion Images: There is normal perfusion on the stress and rest images. No ischemia or infarction.  IMPRESSION: Normal stress nuclear study with chest pain but no electrocardiographic changes. The scintigraphic results show no evidence of ischemia or infarction in any vascular territory. The gated ejection fraction was 81% and the wall motion was normal.  Kirk Ruths   Electronically Signed   By: Kirk Ruths   On: 11/29/2014 17:26   Dg Abd 2 Views  11/28/2014   CLINICAL DATA:  Diffuse abdominal pain and nausea for 2 days.  EXAM: ABDOMEN - 2 VIEW  COMPARISON:  CT abdomen pelvis 11/23/2014.  FINDINGS: Fair amount of stool is seen in the colon. Mild gaseous prominence of small bowel in association. No free air. Lung bases are unremarkable. Postoperative changes are seen in the lumbar spine.  IMPRESSION: Probable constipation.   Electronically Signed   By: Lorin Picket M.D.   On: 11/28/2014 11:46    Microbiology: No results found for this or any previous visit (from the past 240 hour(s)).   Labs: Basic Metabolic Panel:  Recent Labs Lab 11/28/14 1049 11/29/14 0432 11/30/14 0235  NA 132* 132* 131*  K 3.4* 3.9 4.3  CL 98 96 95*  CO2 28 26 30   GLUCOSE 128* 131* 94  BUN 9 17 20   CREATININE 1.08 0.95 1.14*   CALCIUM 8.9 9.4 9.1   Liver Function Tests:  Recent Labs Lab 11/28/14 1049  AST 20  ALT 14  ALKPHOS 56  BILITOT 0.4  PROT 6.5  ALBUMIN 3.8    Recent Labs Lab 11/28/14 1049  LIPASE 36   No results for input(s): AMMONIA in the last 168 hours. CBC:  Recent Labs Lab 11/28/14 1049 11/29/14 0432 11/30/14 0235  WBC 11.5* 14.5* 12.7*  NEUTROABS 9.0*  --   --  HGB 15.6* 15.2* 14.7  HCT 45.5 44.1 44.0  MCV 90.6 92.1 92.6  PLT 280 256 265   Cardiac Enzymes:  Recent Labs Lab 11/28/14 1741 11/28/14 2310  TROPONINI <0.03 <0.03   BNP: BNP (last 3 results) No results for input(s): PROBNP in the last 8760 hours. CBG: No results for input(s): GLUCAP in the last 168 hours.     SignedDelfina Redwood  [facility] 12/01/2014, 10:40 AM

## 2014-12-01 NOTE — Telephone Encounter (Signed)
Santiago Glad called in wanting to know if Dr. Percival Spanish would be the Attendee over pt's home health orders for chest pain and COPD. Please call  Thanks

## 2014-12-01 NOTE — Telephone Encounter (Signed)
Confirmed Dr. Percival Spanish is primary cardiologist.

## 2014-12-01 NOTE — Care Management Note (Signed)
    Page 1 of 2   12/01/2014     10:55:46 AM CARE MANAGEMENT NOTE 12/01/2014  Patient:  [name], [name]   Account Number:  [id number]  Date Initiated:  12/01/2014  Documentation initiated by:  GRAVES-BIGELOW,Anish Vana  Subjective/Objective Assessment:   Pt admitted for CP and COPD exacerbation. Plan for home with [facility] services and DME 02. O2 to be delivered to room before d/c.     Action/Plan:   CM did make referral to [facility] for services and DME. SOC tobegin within 24-48 hrs post d/c.   Anticipated DC Date:  12/01/2014   Anticipated DC Plan:  [place] [facility]  CM consult      [place]   Choice offered to / List presented to:  C-1 Patient   DME arranged  [facility]      DME agency  [place] arranged  [place].   Status of service:  Completed, signed off Medicare Important Message given?  YES (If response is "NO", the following Medicare IM given date fields will be blank) Date Medicare IM given:  12/01/2014 Medicare IM given by:  GRAVES-BIGELOW,Aseel [place] Date Additional Medicare IM given:   Additional Medicare IM given by:    Discharge Disposition:  [place]  Per UR Regulation:  Reviewed for med. necessity/level of care/duration of stay  If discussed at [place] of Stay Meetings, dates discussed:    Comments:

## 2014-12-01 NOTE — Progress Notes (Signed)
Late entry for missed g code 11/30/14:   12/12/14 1100  PT G-Codes **NOT FOR INPATIENT CLASS**  Functional Assessment Tool Used clinical judgment  Functional Limitation Mobility: Walking and moving around  Mobility: Walking and Moving Around Current Status [phone]) CI  Mobility: Walking and Moving Around Goal Status [phone]) [place] [phone] (pager)

## 2015-03-22 ENCOUNTER — Other Ambulatory Visit: Payer: Self-pay | Admitting: Cardiology

## 2015-07-22 ENCOUNTER — Other Ambulatory Visit: Payer: Self-pay | Admitting: Cardiology

## 2015-08-10 ENCOUNTER — Encounter: Payer: Self-pay | Admitting: Cardiology

## 2015-08-10 ENCOUNTER — Ambulatory Visit (INDEPENDENT_AMBULATORY_CARE_PROVIDER_SITE_OTHER): Payer: Medicare Other | Admitting: Cardiology

## 2015-08-10 VITALS — BP 126/70 | HR 59 | Ht 65.0 in | Wt 192.3 lb

## 2015-08-10 DIAGNOSIS — I2510 Atherosclerotic heart disease of native coronary artery without angina pectoris: Secondary | ICD-10-CM | POA: Diagnosis not present

## 2015-08-10 DIAGNOSIS — I10 Essential (primary) hypertension: Secondary | ICD-10-CM

## 2015-08-10 NOTE — Progress Notes (Signed)
HP The patient presents for follow up of CAD. She's had a history of coronary disease with multiple stents.   She was hospitalized January chest ruled out for myocardial infarction. Stress perfusion study was negative. She is not describing any current chest discomfort. She does get shortness of breath but this has been chronic. Her oxygen saturation is 97% when she is ambulating short of breath at home. She's not describing PND or orthopnea. She's not had any palpitations, presyncope or syncope. She is very sedentary at home per her daughter.  Allergies  Allergen Reactions  . Iohexol      Code: HIVES, Desc: PER ROBIN @ [facility], PT IS ALLERGIC TO IVP DYE 08/28/10/RM, Onset Date: 02542706   . Lipitor [Atorvastatin Calcium] Hives    Hives per pt. She states she thinks this is the right medicine    Current Outpatient Prescriptions  Medication Sig Dispense Refill  . acetaminophen (TYLENOL) 650 MG CR tablet Take 650 mg by mouth every 8 (eight) hours as needed for pain.    Marland Kitchen albuterol (PROVENTIL HFA;VENTOLIN HFA) 108 (90 BASE) MCG/ACT inhaler Inhale 2 puffs into the lungs every 4 (four) hours as needed for wheezing. 1 Inhaler 0  . amLODipine (NORVASC) 10 MG tablet Take 1 tablet (10 mg total) by mouth daily. 30 tablet 1  . aspirin EC 325 MG EC tablet Take 1 tablet (325 mg total) by mouth daily. 30 tablet 0  . cholecalciferol (VITAMIN D) 1000 UNITS tablet Take 1,000 Units by mouth daily.     Marland Kitchen guaiFENesin (MUCINEX) 600 MG 12 hr tablet Take 1 tablet (600 mg total) by mouth 2 (two) times daily. 14 tablet 0  . isosorbide mononitrate (IMDUR) 30 MG 24 hr tablet TAKE ONE TABLET BY MOUTH ONCE DAILY 30 tablet 0  . NITROSTAT 0.4 MG SL tablet DISSOLVE ONE TABLET UNDER THE TONGUE EVERY 5 MINUTES AS NEEDED FOR CHEST PAIN.  DO NOT EXCEED A TOTAL OF 3 DOSES IN 15 MINUTES 25 tablet 0  . propranolol (INDERAL) 40 MG tablet Take 20 mg by mouth 2 (two) times daily.     . psyllium (METAMUCIL SMOOTH TEXTURE) 28 % packet  Take 1 packet by mouth 2 (two) times daily.    . ranitidine (ZANTAC) 300 MG tablet     . senna-docusate (SENOKOT-S) 8.6-50 MG per tablet Take 1 tablet by mouth at bedtime as needed for mild constipation.    Marland Kitchen tiotropium (SPIRIVA) 18 MCG inhalation capsule Place 1 capsule (18 mcg total) into inhaler and inhale daily. 30 capsule 1  . [DISCONTINUED] calcium carbonate (OS-CAL) 600 MG TABS Take 600 mg by mouth daily.      . [DISCONTINUED] pravastatin (PRAVACHOL) 40 MG tablet Take 40 mg by mouth daily.       No current facility-administered medications for this visit.    Past Medical History  Diagnosis Date  . Hyperlipemia   . HTN (hypertension)   . GERD (gastroesophageal reflux disease)   . CAD (coronary artery disease)     Multiple percutaneous revascularization. Catheterization May 2013 left main normal, patent LAD stent, 90% circumflex stenosis, patent right coronary artery stents. Patient had a DES to the circumflex. The EF was well-preserved.  Marland Kitchen COPD (chronic obstructive pulmonary disease)   . Hard of hearing   . Arthritis     "aching bones sometimes"  . Squamous cell carcinoma of nose     right  . Fatigue   . Blurred vision   . Hx of radiation therapy  right nose 4500 cGy in 10 sessions over 5 weeks (2 treatments a week)    Past Surgical History  Procedure Laterality Date  . Lumbar spine surgery  04/2011    Rods, screws and cages  . Skin biopsy  10/15/13    right nose-microinvasive squamous cell carcinoma  . Abdominal hysterectomy    . Coronary angioplasty with stent placement  2002-2009    x 5  . Coronary angioplasty with stent placement  05/06/12    "1; this makes 5"  . Esophageal dilation  ~ 2012    Dr. Deatra Ina  . Percutaneous coronary stent intervention (pci-s) N/A 05/06/2012    Procedure: PERCUTANEOUS CORONARY STENT INTERVENTION (PCI-S);  Surgeon: Burnell Blanks, MD;  Location: [facility] CATH LAB;  Service: Cardiovascular;  Laterality: N/A;    ROS: As stated in the  HPI and negative for all other systems.   PHYSICAL EXAM BP 126/70 mmHg  Pulse 59  Ht 5\' 5"  (1.651 m)  Wt 192 lb 4.8 oz (87.227 kg)  BMI 32.00 kg/m2 GENERAL:  Well appearing HEENT:  Pupils equal round and reactive, fundi not visualized, oral mucosa unremarkable, dentures NECK:  No jugular venous distention, waveform within normal limits, carotid upstroke brisk and symmetric, no bruits, no thyromegaly LUNGS:  Clear to auscultation bilaterally BACK:  No CVA tenderness HEART:  PMI not displaced or sustained,S1 and S2 within normal limits, no S3, no S4, no clicks, no rubs, no murmurs ABD:  Flat, positive bowel sounds normal in frequency in pitch, no bruits, no rebound, no guarding, no midline pulsatile mass, no hepatomegaly, no splenomegaly EXT:  2 plus pulses throughout, trace edema   EKG - Sinus rhythm, rate 59, axis within normal limits, intervals within normal limits, no acute ST-T wave changes.  08/10/2015   ASSESSMENT AND PLAN   CAD -   The patient has no new symptoms since her stress test she will continue reduction..   She can reduce her ASA to 81 mg.    Hyperlipemia -   Her labs are as below and she will remain on the current meds. Lab Results  Component Value Date   CHOL 165 11/29/2014   TRIG 52 11/29/2014   HDL 54 11/29/2014   LDLCALC 101* 11/29/2014   HTN (hypertension) -  The blood pressure is at target. No change in medications is indicated. We will continue with therapeutic lifestyle changes (TLC).  TOBACCO ABUSE -  She quit smoking !!!!

## 2015-08-10 NOTE — Patient Instructions (Signed)
Your physician wants you to follow-up in: 1 Year. You will receive a reminder letter in the mail two months in advance. If you don't receive a letter, please call our office to schedule the follow-up appointment.  Your physician has recommended you make the following change in your medication: Decrease Aspirin 81 mg daily

## 2015-08-21 ENCOUNTER — Other Ambulatory Visit: Payer: Self-pay | Admitting: Cardiology

## 2015-10-06 ENCOUNTER — Encounter: Payer: Self-pay | Admitting: Gastroenterology

## 2016-07-17 ENCOUNTER — Other Ambulatory Visit: Payer: Self-pay | Admitting: Cardiology

## 2016-07-17 NOTE — Telephone Encounter (Signed)
Pharmacy requesting a refill on isosorbide mononitrate 30 mg tablet. This medication is not on pt med list and I do not see where this medication was D/C. Please advise

## 2016-08-26 ENCOUNTER — Encounter: Payer: Self-pay | Admitting: Cardiology

## 2016-09-09 ENCOUNTER — Ambulatory Visit (INDEPENDENT_AMBULATORY_CARE_PROVIDER_SITE_OTHER): Payer: Medicare Other | Admitting: Cardiology

## 2016-09-09 ENCOUNTER — Encounter: Payer: Self-pay | Admitting: Cardiology

## 2016-09-09 VITALS — BP 130/64 | HR 61 | Ht 65.0 in | Wt 199.4 lb

## 2016-09-09 DIAGNOSIS — I2510 Atherosclerotic heart disease of native coronary artery without angina pectoris: Secondary | ICD-10-CM | POA: Diagnosis not present

## 2016-09-09 DIAGNOSIS — R0602 Shortness of breath: Secondary | ICD-10-CM | POA: Diagnosis not present

## 2016-09-09 DIAGNOSIS — Z79899 Other long term (current) drug therapy: Secondary | ICD-10-CM | POA: Diagnosis not present

## 2016-09-09 NOTE — Progress Notes (Signed)
HP The patient presents for follow up of CAD. She's had a history of coronary disease with multiple stents.   She was hospitalized January of last year with chest pain and ruled out for myocardial infarction. Stress perfusion study was negative.  She has multiple complaints and says that she just does not feel well.  She has chronic dyspnea.  Her PCP requested a CXR.   Today she reported dyspnea which has been her chronic shortness of breath walking her around the office on room air her sats went to 85%. She was quite dyspneic. On further talking to her she's had oxygen in the past but she [place] a few years ago. She's not seeing a lung doctor. She's not having any chest pressure, neck or arm discomfort. She's not having any palpitations, presyncope or syncope.  Her weight is up a little bit she's had no increased swelling.   Allergies  Allergen Reactions  . Iohexol      Code: HIVES, Desc: PER ROBIN @ [facility], PT IS ALLERGIC TO IVP DYE 08/28/10/RM, Onset Date: [id number]   . Lipitor [Atorvastatin Calcium] Hives    Hives per pt. She states she thinks this is the right medicine    Current Outpatient Prescriptions  Medication Sig Dispense Refill  . acetaminophen (TYLENOL) 650 MG CR tablet Take 650 mg by mouth every 8 (eight) hours as needed for pain.    Marland Kitchen albuterol (PROVENTIL HFA;VENTOLIN HFA) 108 (90 BASE) MCG/ACT inhaler Inhale 2 puffs into the lungs every 4 (four) hours as needed for wheezing. 1 Inhaler 0  . amLODipine (NORVASC) 10 MG tablet Take 1 tablet (10 mg total) by mouth daily. 30 tablet 1  . aspirin 81 MG tablet Take 81 mg by mouth daily.    . cholecalciferol (VITAMIN D) 1000 UNITS tablet Take 1,000 Units by mouth daily.     Marland Kitchen guaiFENesin (MUCINEX) 600 MG 12 hr tablet Take 1 tablet (600 mg total) by mouth 2 (two) times daily. 14 tablet 0  . isosorbide mononitrate (IMDUR) 30 MG 24 hr tablet TAKE ONE TABLET BY MOUTH ONCE DAILY 30 tablet 0  . NITROSTAT 0.4 MG SL tablet DISSOLVE ONE TABLET  UNDER THE TONGUE EVERY 5 MINUTES AS NEEDED FOR CHEST PAIN.  DO NOT EXCEED A TOTAL OF 3 DOSES IN 15 MINUTES 25 tablet 3  . propranolol (INDERAL) 40 MG tablet Take 20 mg by mouth 2 (two) times daily.     . psyllium (METAMUCIL SMOOTH TEXTURE) 28 % packet Take 1 packet by mouth 2 (two) times daily.    . ranitidine (ZANTAC) 300 MG tablet     . senna-docusate (SENOKOT-S) 8.6-50 MG per tablet Take 1 tablet by mouth at bedtime as needed for mild constipation.    Marland Kitchen tiotropium (SPIRIVA) 18 MCG inhalation capsule Place 1 capsule (18 mcg total) into inhaler and inhale daily. 30 capsule 1   No current facility-administered medications for this visit.     Past Medical History:  Diagnosis Date  . Arthritis    "aching bones sometimes"  . Blurred vision   . CAD (coronary artery disease)    Multiple percutaneous revascularization. Catheterization May 2013 left main normal, patent LAD stent, 90% circumflex stenosis, patent right coronary artery stents. Patient had a DES to the circumflex. The EF was well-preserved.  Marland Kitchen COPD (chronic obstructive pulmonary disease) ([place])   . Fatigue   . GERD (gastroesophageal reflux disease)   . Hard of hearing   . HTN (hypertension)   . Hx  of radiation therapy    right nose 4500 cGy in 10 sessions over 5 weeks (2 treatments a week)  . Hyperlipemia   . Squamous cell carcinoma of nose    right    Past Surgical History:  Procedure Laterality Date  . ABDOMINAL HYSTERECTOMY    . CORONARY ANGIOPLASTY WITH STENT PLACEMENT  2002-2009   x 5  . CORONARY ANGIOPLASTY WITH STENT PLACEMENT  05/06/12   "1; this makes 5"  . ESOPHAGEAL DILATION  ~ 2012   Dr. Deatra Ina  . LUMBAR SPINE SURGERY  04/2011   Rods, screws and cages  . PERCUTANEOUS CORONARY STENT INTERVENTION (PCI-S) N/A 05/06/2012   Procedure: PERCUTANEOUS CORONARY STENT INTERVENTION (PCI-S);  Surgeon: Burnell Blanks, MD;  Location: [facility] CATH LAB;  Service: Cardiovascular;  Laterality: N/A;  . SKIN BIOPSY  10/15/13    right nose-microinvasive squamous cell carcinoma    ROS:  As stated in the HPI and negative for all other systems.   PHYSICAL EXAM There were no vitals taken for this visit. GENERAL:  Well appearing HEENT:  Pupils equal round and reactive, fundi not visualized, oral mucosa unremarkable, dentures NECK:  No jugular venous distention, waveform within normal limits, carotid upstroke brisk and symmetric, no bruits, no thyromegaly LUNGS:  Clear to auscultation bilaterally BACK:  No CVA tenderness HEART:  PMI not displaced or sustained,S1 and S2 within normal limits, no S3, no S4, no clicks, no rubs, no murmurs ABD:  Flat, positive bowel sounds normal in frequency in pitch, no bruits, no rebound, no guarding, no midline pulsatile mass, no hepatomegaly, no splenomegaly EXT:  2 plus pulses throughout, trace edema   EKG - Sinus rhythm, rate 61, axis within normal limits, intervals within normal limits, no acute ST-T wave changes.  09/09/16   ASSESSMENT AND PLAN  DYSPNEA - She has dyspnea as described with falling oxygen saturations. I suspect this is a primary lung issue. However, she needs to see a pulmonologist. I will defer getting a chest x-ray reportedly function tests until they've seen her. I will check lab work to include a BNP. I'm going to go ahead and renew her prescription for oxygen.  CAD -  The patient had a negative stress perfusion study last year. She's not having any symptoms are consistent with ischemia although certainly her dyspnea could be an anginal equivalent. I will consider further evaluation based on the pulmonary evaluation as above.  Hyperlipemia -   Her labs are as below and she will remain on the current meds. Lab Results  Component Value Date   CHOL 165 11/29/2014   TRIG 52 11/29/2014   HDL 54 11/29/2014   LDLCALC 101 (H) 11/29/2014   HTN (hypertension) -  The blood pressure is at target. No change in medications is indicated. We will continue with  therapeutic lifestyle changes (TLC).  TOBACCO ABUSE -  She told me at the last visit that she quit smoking.

## 2016-09-09 NOTE — Patient Instructions (Signed)
Medication Instructions:  Continue current medications  Labwork: CBC, BMP, BNP  Testing/Procedures: None Ordered  Follow-Up: You have been referred to Pulmonologist  Your physician wants you to follow-up in: 4 Months.    Any Other Special Instructions Will Be Listed Below (If Applicable).   If you need a refill on your cardiac medications before your next appointment, please call your pharmacy.

## 2016-09-10 ENCOUNTER — Encounter: Payer: Self-pay | Admitting: Cardiology

## 2016-09-10 LAB — CBC
HCT: 45.3 % — ABNORMAL HIGH (ref 35.0–45.0)
HEMOGLOBIN: 14.9 g/dL (ref 11.7–15.5)
MCH: 30.5 pg (ref 27.0–33.0)
MCHC: 32.9 g/dL (ref 32.0–36.0)
MCV: 92.8 fL (ref 80.0–100.0)
MPV: 11.1 fL (ref 7.5–12.5)
Platelets: 288 10*3/uL (ref 140–400)
RBC: 4.88 MIL/uL (ref 3.80–5.10)
RDW: 13.2 % (ref 11.0–15.0)
WBC: 7.8 10*3/uL (ref 3.8–10.8)

## 2016-09-10 LAB — BASIC METABOLIC PANEL
BUN: 12 mg/dL (ref 7–25)
CO2: 24 mmol/L (ref 20–31)
CREATININE: 1.03 mg/dL — AB (ref 0.60–0.93)
Calcium: 9.6 mg/dL (ref 8.6–10.4)
Chloride: 103 mmol/L (ref 98–110)
GLUCOSE: 88 mg/dL (ref 65–99)
POTASSIUM: 4.3 mmol/L (ref 3.5–5.3)
Sodium: 141 mmol/L (ref 135–146)

## 2016-09-10 LAB — BRAIN NATRIURETIC PEPTIDE: Brain Natriuretic Peptide: 82.7 pg/mL (ref <100)

## 2016-10-07 ENCOUNTER — Telehealth: Payer: Self-pay | Admitting: Pulmonary Disease

## 2016-10-07 NOTE — Telephone Encounter (Signed)
Spoke with Leafy Ro from [facility] who states pt will need a qualifying walk. Pt is scheduled for a consult with PM on 10/15/16. Appt notes have been updated stating that pt will need a qualifying walk. I will also route this to [place] for a FYI.  Nothing further needed.

## 2016-10-15 ENCOUNTER — Encounter: Payer: Self-pay | Admitting: Pulmonary Disease

## 2016-10-15 ENCOUNTER — Ambulatory Visit (INDEPENDENT_AMBULATORY_CARE_PROVIDER_SITE_OTHER)
Admission: RE | Admit: 2016-10-15 | Discharge: 2016-10-15 | Disposition: A | Discharge status: Home / Self Care | Payer: Medicare Other | Source: Ambulatory Visit | Attending: Pulmonary Disease | Admitting: Pulmonary Disease

## 2016-10-15 ENCOUNTER — Ambulatory Visit (INDEPENDENT_AMBULATORY_CARE_PROVIDER_SITE_OTHER): Payer: Medicare Other | Admitting: Pulmonary Disease

## 2016-10-15 DIAGNOSIS — R0600 Dyspnea, unspecified: Secondary | ICD-10-CM | POA: Diagnosis not present

## 2016-10-15 DIAGNOSIS — J9611 Chronic respiratory failure with hypoxia: Secondary | ICD-10-CM

## 2016-10-15 NOTE — Progress Notes (Signed)
[name]    [id number]    [birth year]  Primary Care Physician:[name] Danne Baxter, MD  Referring Physician: Leonard Downing, MD [address] [place], [place] [address]  Chief complaint:  Consult for evaluation of dyspnea.  HPI:  [name] is a 81 year old with past history as below. She has history of coronary artery disease with multiple stents. She had a stress test in January 2016 which was negative. She continues to complain of dyspnea and has been referred here by her cardiologist for further evaluation. She was started on supplemental oxygen at 2 L/m 2 weeks ago for desaturations.  She has extensive smoking history with a diagnosis of COPD. There are no PFTs on record. She is maintained on Spiriva inhaler. Her chief complaints are dyspnea on exertion. She denies any dyspnea at rest, cough, sputum production, wheezing. He has been maintained on Spiriva for the past 2 years and feels it helps with her breathing. She has about 30-pack-year smoking history and quit in 2016.  Outpatient Encounter Prescriptions as of 10/15/2016  Medication Sig  . albuterol (PROVENTIL HFA;VENTOLIN HFA) 108 (90 BASE) MCG/ACT inhaler Inhale 2 puffs into the lungs every 4 (four) hours as needed for wheezing.  Marland Kitchen amLODipine (NORVASC) 10 MG tablet Take 1 tablet (10 mg total) by mouth daily.  Marland Kitchen aspirin 81 MG tablet Take 81 mg by mouth daily.  . cholecalciferol (VITAMIN D) 1000 UNITS tablet Take 1,000 Units by mouth daily.   Marland Kitchen guaiFENesin (MUCINEX) 600 MG 12 hr tablet Take 1 tablet (600 mg total) by mouth 2 (two) times daily.  . isosorbide mononitrate (IMDUR) 30 MG 24 hr tablet TAKE ONE TABLET BY MOUTH ONCE DAILY  . NITROSTAT 0.4 MG SL tablet DISSOLVE ONE TABLET UNDER THE TONGUE EVERY 5 MINUTES AS NEEDED FOR CHEST PAIN.  DO NOT EXCEED A TOTAL OF 3 DOSES IN 15 MINUTES  . propranolol (INDERAL) 40 MG tablet Take 20 mg by mouth 2 (two) times daily.   . psyllium (METAMUCIL SMOOTH TEXTURE)  28 % packet Take 1 packet by mouth 2 (two) times daily.  . ranitidine (ZANTAC) 300 MG tablet   . tiotropium (SPIRIVA) 18 MCG inhalation capsule Place 1 capsule (18 mcg total) into inhaler and inhale daily.   No facility-administered encounter medications on file as of 10/15/2016.     Allergies as of 10/15/2016 - Review Complete 09/10/2016  Allergen Reaction Noted  . Iohexol  08/28/2010  . Lipitor [atorvastatin calcium] Hives 02/05/2012    Past Medical History:  Diagnosis Date  . Arthritis    "aching bones sometimes"  . Blurred vision   . CAD (coronary artery disease)    Multiple percutaneous revascularization. Catheterization May 2013 left main normal, patent LAD stent, 90% circumflex stenosis, patent right coronary artery stents. Patient had a DES to the circumflex. The EF was well-preserved.  Marland Kitchen COPD (chronic obstructive pulmonary disease) ([place])   . Fatigue   . GERD (gastroesophageal reflux disease)   . Hard of hearing   . HTN (hypertension)   . Hx of radiation therapy    right nose 4500 cGy in 10 sessions over 5 weeks (2 treatments a week)  . Hyperlipemia   . Squamous cell carcinoma of nose    right    Past Surgical History:  Procedure Laterality Date  . ABDOMINAL HYSTERECTOMY    . CORONARY ANGIOPLASTY WITH STENT PLACEMENT  2002-2009   x 5  . CORONARY ANGIOPLASTY WITH STENT PLACEMENT  05/06/12   "1; this makes 5"  . ESOPHAGEAL DILATION  ~ 2012   Dr. Deatra Ina  . LUMBAR SPINE SURGERY  04/2011   Rods, screws and cages  . PERCUTANEOUS CORONARY STENT INTERVENTION (PCI-S) N/A 05/06/2012   Procedure: PERCUTANEOUS CORONARY STENT INTERVENTION (PCI-S);  Surgeon: Burnell Blanks, MD;  Location[facility] CATH LAB;  Service: Cardiovascular;  Laterality: N/A;  . SKIN BIOPSY  10/15/13   right nose-microinvasive squamous cell carcinoma    Family History  Problem Relation Age of Onset  . Glaucoma Father   . Heart attack Father   . Bone cancer Mother     Social History   Social  History  . Marital status: Widowed    Spouse name: N/A  . Number of children: 4  . Years of education: N/A   Occupational History  . retired Retired   Social History Main Topics  . Smoking status: Former Smoker    Packs/day: 0.50    Years: 55.00    Types: Cigarettes  . Smokeless tobacco: Former Systems developer    Types: Snuff     Comment: Quit earlier this year.   . Alcohol use No  . Drug use: No  . Sexual activity: No     Comment: 1 cig to 1/4 of a pack a day   Other Topics Concern  . Not on file   Social History Narrative  . No narrative on file    Review of systems: Review of Systems  Constitutional: Negative for fever and chills.  HENT: Negative.   Eyes: Negative for blurred vision.  Respiratory: as per HPI  Cardiovascular: Negative for chest pain and palpitations.  Gastrointestinal: Negative for vomiting, diarrhea, blood per rectum. Genitourinary: Negative for dysuria, urgency, frequency and hematuria.  Musculoskeletal: Negative for myalgias, back pain and joint pain.  Skin: Negative for itching and rash.  Neurological: Negative for dizziness, tremors, focal weakness, seizures and loss of consciousness.  Endo/Heme/Allergies: Negative for environmental allergies.  Psychiatric/Behavioral: Negative for depression, suicidal ideas and hallucinations.  All other systems reviewed and are negative.  Physical Exam: There were no vitals taken for this visit. Gen:      No acute distress HEENT:  EOMI, sclera anicteric Neck:     No masses; no thyromegaly Lungs:    Clear to auscultation bilaterally; normal respiratory effort CV:         Regular rate and rhythm; no murmurs Abd:      + bowel sounds; soft, non-tender; no palpable masses, no distension Ext:    No edema; adequate peripheral perfusion Skin:      Warm and dry; no rash Neuro: alert and oriented x 3 Psych: normal mood and affect  Data Reviewed: Chest x-ray 11/28/14-no acute cardiac pulmonary abnormality. Images  reviewed.  Assessment:  Consult for evaluation of dyspnea. I suspect she has severe COPD based on her presentation and smoking history. I will start her on Breo and continue the Spiriva. She'll get scheduled for a chest x-ray and pulmonary function tests. She will also continue on her supplemental oxygen. Regroup in 1 month to review these results and assess response to therapy.  Plan/Recommendations: - CXR, PFTs - Continue spiriva, start breo 100/25  Marshell Garfinkel MD  Pulmonary and Critical Care Pager [phone] 10/15/2016, 12:16 PM  CC: [name], *

## 2016-10-15 NOTE — Patient Instructions (Signed)
We will schedule her for a chest x-ray and pulmonary function tests. Continue using his Spiriva inhaler. We'll add Breo 100/25  Return in 1 month

## 2016-10-18 NOTE — Progress Notes (Signed)
[id number] x 1 

## 2016-10-23 NOTE — Progress Notes (Signed)
Called spoke with patient, advised of cxr results / recs as stated by PM.  Pt verbalized her understanding and denied any questions.

## 2016-10-28 ENCOUNTER — Ambulatory Visit (HOSPITAL_COMMUNITY)
Admission: RE | Admit: 2016-10-28 | Discharge: 2016-10-28 | Disposition: A | Discharge status: Home / Self Care | Payer: Medicare Other | Source: Ambulatory Visit | Attending: Pulmonary Disease | Admitting: Pulmonary Disease

## 2016-10-28 DIAGNOSIS — R0600 Dyspnea, unspecified: Secondary | ICD-10-CM | POA: Diagnosis present

## 2016-10-28 LAB — PULMONARY FUNCTION TEST
DL/VA % PRED: 42 %
DL/VA: 2.1 ml/min/mmHg/L
DLCO UNC % PRED: 19 %
DLCO UNC: 5 ml/min/mmHg
FEF 25-75 PRE: 0.24 L/s
FEF 25-75 Post: 0.32 L/sec
FEF2575-%Change-Post: 32 %
FEF2575-%PRED-PRE: 16 %
FEF2575-%Pred-Post: 21 %
FEV1-%Change-Post: 19 %
FEV1-%PRED-PRE: 37 %
FEV1-%Pred-Post: 44 %
FEV1-Post: 0.92 L
FEV1-Pre: 0.76 L
FEV1FVC-%Change-Post: 12 %
FEV1FVC-%PRED-PRE: 60 %
FEV6-%Change-Post: 8 %
FEV6-%PRED-POST: 61 %
FEV6-%Pred-Pre: 57 %
FEV6-POST: 1.6 L
FEV6-Pre: 1.48 L
FEV6FVC-%CHANGE-POST: 1 %
FEV6FVC-%PRED-POST: 92 %
FEV6FVC-%Pred-Pre: 91 %
FVC-%Change-Post: 6 %
FVC-%Pred-Post: 66 %
FVC-%Pred-Pre: 62 %
FVC-Post: 1.82 L
FVC-Pre: 1.71 L
PRE FEV1/FVC RATIO: 45 %
Post FEV1/FVC ratio: 50 %
Post FEV6/FVC ratio: 88 %
Pre FEV6/FVC Ratio: 87 %
RV % pred: 101 %
RV: 2.48 L
TLC % PRED: 82 %
TLC: 4.27 L

## 2016-10-28 MED ORDER — ALBUTEROL SULFATE (2.5 MG/3ML) 0.083% IN NEBU
2.5000 mg | INHALATION_SOLUTION | Freq: Once | RESPIRATORY_TRACT | Status: AC
  Administered 2016-10-28: 2.5 mg via RESPIRATORY_TRACT

## 2016-12-05 ENCOUNTER — Ambulatory Visit: Payer: Medicare Other | Admitting: Pulmonary Disease

## 2016-12-26 ENCOUNTER — Encounter (HOSPITAL_COMMUNITY): Payer: Self-pay | Admitting: Emergency Medicine

## 2016-12-26 ENCOUNTER — Emergency Department (HOSPITAL_COMMUNITY): Payer: Medicare Other

## 2016-12-26 ENCOUNTER — Emergency Department (HOSPITAL_COMMUNITY)
Admission: EM | Admit: 2016-12-26 | Discharge: 2016-12-26 | Disposition: A | Discharge status: Home / Self Care | Payer: Medicare Other | Attending: Emergency Medicine | Admitting: Emergency Medicine

## 2016-12-26 DIAGNOSIS — Z955 Presence of coronary angioplasty implant and graft: Secondary | ICD-10-CM | POA: Diagnosis not present

## 2016-12-26 DIAGNOSIS — I2510 Atherosclerotic heart disease of native coronary artery without angina pectoris: Secondary | ICD-10-CM | POA: Insufficient documentation

## 2016-12-26 DIAGNOSIS — J111 Influenza due to unidentified influenza virus with other respiratory manifestations: Secondary | ICD-10-CM | POA: Diagnosis not present

## 2016-12-26 DIAGNOSIS — J449 Chronic obstructive pulmonary disease, unspecified: Secondary | ICD-10-CM | POA: Insufficient documentation

## 2016-12-26 DIAGNOSIS — Z79899 Other long term (current) drug therapy: Secondary | ICD-10-CM | POA: Insufficient documentation

## 2016-12-26 DIAGNOSIS — R05 Cough: Secondary | ICD-10-CM | POA: Diagnosis present

## 2016-12-26 DIAGNOSIS — G8929 Other chronic pain: Secondary | ICD-10-CM | POA: Insufficient documentation

## 2016-12-26 DIAGNOSIS — I10 Essential (primary) hypertension: Secondary | ICD-10-CM | POA: Insufficient documentation

## 2016-12-26 DIAGNOSIS — R0600 Dyspnea, unspecified: Secondary | ICD-10-CM | POA: Insufficient documentation

## 2016-12-26 DIAGNOSIS — Z87891 Personal history of nicotine dependence: Secondary | ICD-10-CM | POA: Insufficient documentation

## 2016-12-26 DIAGNOSIS — M545 Low back pain: Secondary | ICD-10-CM | POA: Diagnosis not present

## 2016-12-26 DIAGNOSIS — R109 Unspecified abdominal pain: Secondary | ICD-10-CM | POA: Insufficient documentation

## 2016-12-26 DIAGNOSIS — Z7982 Long term (current) use of aspirin: Secondary | ICD-10-CM | POA: Insufficient documentation

## 2016-12-26 LAB — BASIC METABOLIC PANEL
Anion gap: 9 (ref 5–15)
BUN: 18 mg/dL (ref 6–20)
CHLORIDE: 102 mmol/L (ref 101–111)
CO2: 28 mmol/L (ref 22–32)
CREATININE: 1.02 mg/dL — AB (ref 0.44–1.00)
Calcium: 9.2 mg/dL (ref 8.9–10.3)
GFR calc Af Amer: 59 mL/min — ABNORMAL LOW (ref >60)
GFR, EST NON AFRICAN AMERICAN: 51 mL/min — AB (ref >60)
Glucose, Bld: 147 mg/dL — ABNORMAL HIGH (ref 65–99)
Potassium: 3.8 mmol/L (ref 3.5–5.1)
Sodium: 139 mmol/L (ref 135–145)

## 2016-12-26 LAB — CBC
HCT: 44 % (ref 36.0–46.0)
Hemoglobin: 13.9 g/dL (ref 12.0–15.0)
MCH: 30.1 pg (ref 26.0–34.0)
MCHC: 31.6 g/dL (ref 30.0–36.0)
MCV: 95.2 fL (ref 78.0–100.0)
Platelets: 225 10*3/uL (ref 150–400)
RBC: 4.62 MIL/uL (ref 3.87–5.11)
RDW: 12.8 % (ref 11.5–15.5)
WBC: 6.9 10*3/uL (ref 4.0–10.5)

## 2016-12-26 MED ORDER — ALBUTEROL SULFATE (2.5 MG/3ML) 0.083% IN NEBU
5.0000 mg | INHALATION_SOLUTION | Freq: Once | RESPIRATORY_TRACT | Status: DC
  Filled 2016-12-26: qty 6

## 2016-12-26 MED ORDER — TRAMADOL HCL 50 MG PO TABS: 50.0000 mg | ORAL_TABLET | Freq: Four times a day (QID) | ORAL | 0 refills | Status: DC | PRN

## 2016-12-26 MED ORDER — HYDROCODONE-ACETAMINOPHEN 5-325 MG PO TABS
1.0000 | ORAL_TABLET | Freq: Once | ORAL | Status: AC
  Administered 2016-12-26: 1 via ORAL
  Filled 2016-12-26: qty 1

## 2016-12-26 MED ORDER — PREDNISONE 20 MG PO TABS: 20.0000 mg | ORAL_TABLET | Freq: Two times a day (BID) | ORAL | 0 refills | Status: DC

## 2016-12-26 MED ORDER — METHYLPREDNISOLONE SODIUM SUCC 125 MG IJ SOLR
125.0000 mg | Freq: Once | INTRAMUSCULAR | Status: AC
  Administered 2016-12-26: 125 mg via INTRAVENOUS
  Filled 2016-12-26: qty 2

## 2016-12-26 MED ORDER — OSELTAMIVIR PHOSPHATE 75 MG PO CAPS: 75.0000 mg | ORAL_CAPSULE | Freq: Two times a day (BID) | ORAL | 0 refills | Status: DC

## 2016-12-26 MED ORDER — ALBUTEROL SULFATE (2.5 MG/3ML) 0.083% IN NEBU
2.5000 mg | INHALATION_SOLUTION | RESPIRATORY_TRACT | Status: DC | PRN
  Administered 2016-12-26: 2.5 mg via RESPIRATORY_TRACT

## 2016-12-26 MED ORDER — OSELTAMIVIR PHOSPHATE 75 MG PO CAPS
75.0000 mg | ORAL_CAPSULE | Freq: Once | ORAL | Status: AC
  Administered 2016-12-26: 75 mg via ORAL
  Filled 2016-12-26: qty 1

## 2016-12-26 NOTE — Discharge Instructions (Signed)
Your back pain is chronic, secondary to arthritis.  Tramadol prescription for pain. The steroids prescribed for your emphysema may temporarily relieve your back pain. Tamiflu for influenza.

## 2016-12-26 NOTE — ED Notes (Signed)
Bed: [id number] Expected date:  Expected time:  Means of arrival:  Comments: EMS-COPD

## 2016-12-26 NOTE — ED Triage Notes (Signed)
Pt initially called EMS for worsening back pain, lower back pain has been present for over 1 year. Pt also c/o upper abdominal pain. When EMS arrived patient c/o worsening shortness of breath. EMS gave patient 5mg  albuterols, a duoneb and 125mg  Solumedrol IV. Pt with wheezing in upper lobes and decreased in lower bilaterally when EMS arrived. Pt on 2L [place] at home initially sat 90-92 on ems arrival, sats 95-96 on arrival to ED.

## 2016-12-26 NOTE — ED Provider Notes (Signed)
[name] DEPT Provider Note   CSN: [id number] Arrival date & time: 12/26/16  1024     History   Chief Complaint Chief Complaint  Patient presents with  . Shortness of Breath  . Abdominal Pain  . Back Pain    HPI [name] is a 82 y.o. female.CC:  Multiple complaints including worsening back pain. Abdominal pain. And difficulty breathing.  HPI:  Recent states the reason for call today is his back pain. She's had back pain for over a year and was told by her doctor that she has arthritis. Also states that she has almost daily abdominal pain. Cannot specify. It is "all over". She has difficulty describing a difference between her back pain and abdominal pain and she puts one hand over her xiphoid and other hand over her pelvis and states "is here to here, front and back"  States she's had a cough for the last few days. Felt warm" "sweaty" yesterday. States her "full body hurts". Was wheezing per paramedics. Given nebulized albuterol. States it did help.  Past Medical History:  Diagnosis Date  . Arthritis    "aching bones sometimes"  . Blurred vision   . CAD (coronary artery disease)    Multiple percutaneous revascularization. Catheterization May 2013 left main normal, patent LAD stent, 90% circumflex stenosis, patent right coronary artery stents. Patient had a DES to the circumflex. The EF was well-preserved.  Marland Kitchen COPD (chronic obstructive pulmonary disease) ([place])   . Fatigue   . GERD (gastroesophageal reflux disease)   . Hard of hearing   . HTN (hypertension)   . Hx of radiation therapy    right nose 4500 cGy in 10 sessions over 5 weeks (2 treatments a week)  . Hyperlipemia   . Squamous cell carcinoma of nose    right    Patient Active Problem List   Diagnosis Date Noted  . AP (abdominal pain)   . Atypical chest pain 11/28/2014  . Chest pain 11/28/2014  . Tobacco abuse 11/28/2014  . Skin cancer of nose   . Squamous cell carcinoma of skin of other and  unspecified parts of face 10/15/2013  . COPD with acute exacerbation ([place]) 01/30/2013  . Abdominal pain 01/30/2013  . Constipation 01/30/2013  . Hypokalemia 01/30/2013  . Leukocytosis 05/14/2012  . HTN (hypertension) 05/14/2012  . Progressive angina ([place]) 05/07/2012  . Hyperlipemia   . Dysphagia, unspecified(787.20) 02/04/2012  . Esophageal reflux 02/04/2012  . Coronary atherosclerosis 08/28/2010  . ANAL OR RECTAL PAIN 08/28/2010  . NAUSEA ALONE 08/28/2010    Past Surgical History:  Procedure Laterality Date  . ABDOMINAL HYSTERECTOMY    . CORONARY ANGIOPLASTY WITH STENT PLACEMENT  2002-2009   x 5  . CORONARY ANGIOPLASTY WITH STENT PLACEMENT  05/06/12   "1; this makes 5"  . ESOPHAGEAL DILATION  ~ 2012   Dr. Deatra Ina  . LUMBAR SPINE SURGERY  04/2011   Rods, screws and cages  . PERCUTANEOUS CORONARY STENT INTERVENTION (PCI-S) N/A 05/06/2012   Procedure: PERCUTANEOUS CORONARY STENT INTERVENTION (PCI-S);  Surgeon: Burnell Blanks, MD;  Location: [facility] CATH LAB;  Service: Cardiovascular;  Laterality: N/A;  . SKIN BIOPSY  10/15/13   right nose-microinvasive squamous cell carcinoma    OB History    No data available       Home Medications    Prior to Admission medications   Medication Sig Start Date End Date Taking? Authorizing Provider  albuterol (PROVENTIL HFA;VENTOLIN HFA) 108 (90 BASE) MCG/ACT inhaler Inhale 2 puffs  into the lungs every 4 (four) hours as needed for wheezing. 12/01/14   Delfina Redwood, MD  amLODipine (NORVASC) 10 MG tablet Take 1 tablet (10 mg total) by mouth daily. 12/01/14   Delfina Redwood, MD  aspirin 81 MG tablet Take 81 mg by mouth daily.    Historical Provider, MD  cholecalciferol (VITAMIN D) 1000 UNITS tablet Take 1,000 Units by mouth daily.     Historical Provider, MD  guaiFENesin (MUCINEX) 600 MG 12 hr tablet Take 1 tablet (600 mg total) by mouth 2 (two) times daily. 02/03/13   Kathie Dike, MD  isosorbide mononitrate (IMDUR) 30 MG 24 hr  tablet TAKE ONE TABLET BY MOUTH ONCE DAILY 07/17/16   Minus Breeding, MD  NITROSTAT 0.4 MG SL tablet DISSOLVE ONE TABLET UNDER THE TONGUE EVERY 5 MINUTES AS NEEDED FOR CHEST PAIN.  DO NOT EXCEED A TOTAL OF 3 DOSES IN 15 MINUTES 08/21/15   Minus Breeding, MD  oseltamivir (TAMIFLU) 75 MG capsule Take 1 capsule (75 mg total) by mouth every 12 (twelve) hours. 12/26/16   Tanna Furry, MD  predniSONE (DELTASONE) 20 MG tablet Take 1 tablet (20 mg total) by mouth 2 (two) times daily with a meal. 12/26/16   Tanna Furry, MD  propranolol (INDERAL) 40 MG tablet Take 20 mg by mouth 2 (two) times daily.     Historical Provider, MD  psyllium (METAMUCIL SMOOTH TEXTURE) 28 % packet Take 1 packet by mouth 2 (two) times daily.    Historical Provider, MD  ranitidine (ZANTAC) 300 MG tablet  01/21/14   Historical Provider, MD  tiotropium (SPIRIVA) 18 MCG inhalation capsule Place 1 capsule (18 mcg total) into inhaler and inhale daily. 02/03/13   Kathie Dike, MD  traMADol (ULTRAM) 50 MG tablet Take 1 tablet (50 mg total) by mouth every 6 (six) hours as needed. 12/26/16   Tanna Furry, MD    Family History Family History  Problem Relation Age of Onset  . Glaucoma Father   . Heart attack Father   . Bone cancer Mother     Social History Social History  Substance Use Topics  . Smoking status: Former Smoker    Packs/day: 0.50    Years: 55.00    Types: Cigarettes  . Smokeless tobacco: Former Systems developer    Types: Snuff     Comment: Quit earlier this year.   . Alcohol use No     Allergies   Iohexol and Lipitor [atorvastatin calcium]   Review of Systems Review of Systems  Constitutional: Positive for chills and fatigue. Negative for appetite change, diaphoresis and fever.  HENT: Negative for mouth sores, sore throat and trouble swallowing.   Eyes: Negative for visual disturbance.  Respiratory: Positive for cough, shortness of breath and wheezing. Negative for chest tightness.   Cardiovascular: Negative for chest pain.    Gastrointestinal: Positive for abdominal pain. Negative for abdominal distention, diarrhea, nausea and vomiting.  Endocrine: Negative for polydipsia, polyphagia and polyuria.  Genitourinary: Negative for dysuria, frequency and hematuria.  Musculoskeletal: Positive for back pain. Negative for gait problem.  Skin: Negative for color change, pallor and rash.  Neurological: Negative for dizziness, syncope, light-headedness and headaches.  Hematological: Does not bruise/bleed easily.  Psychiatric/Behavioral: Negative for behavioral problems and confusion.     Physical Exam Updated Vital Signs BP 136/64   Pulse 68   Temp 98 F (36.7 C) (Oral)   Resp 21   SpO2 90%   Physical Exam  Constitutional: She is oriented to person,  place, and time. She appears well-developed and well-nourished. No distress.  Awake and alert. Does not appear in any respiratory, or overt distress. Conversant. Lucid.  HENT:  Head: Normocephalic.  Eyes: Conjunctivae are normal. Pupils are equal, round, and reactive to light. No scleral icterus.  Neck: Normal range of motion. Neck supple. No thyromegaly present.  Cardiovascular: Normal rate and regular rhythm.  Exam reveals no gallop and no friction rub.   No murmur heard. Pulmonary/Chest: Effort normal and breath sounds normal. No respiratory distress. She has no wheezes. She has no rales.  Globally diminished breath sounds. Mild prolongation.  Abdominal: Soft. Bowel sounds are normal. She exhibits no distension. There is no tenderness. There is no rebound.  Soft benign abdomen. No peritoneal irritation.  Musculoskeletal: Normal range of motion.  Neurological: She is alert and oriented to person, place, and time.  Normal symmetric Strength to shoulder shrug, triceps, biceps, grip,wrist flex/extend,and intrinsics  Norma lsymmetric sensation above and below clavicles, and to all distributions to UEs. Normal symmetric strength to flex/.extend hip and knees,  dorsi/plantar flex ankles. Normal symmetric sensation to all distributions to LEs Patellar and achilles reflexes 1-2+. Downgoing Babinski   Skin: Skin is warm and dry. No rash noted.  Psychiatric: She has a normal mood and affect. Her behavior is normal.     ED Treatments / Results  Labs (all labs ordered are listed, but only abnormal results are displayed) Labs Reviewed  BASIC METABOLIC PANEL - Abnormal; Notable for the following:       Result Value   Glucose, Bld 147 (*)    Creatinine, Ser 1.02 (*)    GFR calc non Af Amer 51 (*)    GFR calc Af Amer 59 (*)    All other components within normal limits  CBC    EKG  EKG Interpretation  Date/Time:  Thursday December 26 2016 10:46:37 EST Ventricular Rate:  58 PR Interval:    QRS Duration: 103 QT Interval:  438 QTC Calculation: 431 R Axis:   68 Text Interpretation:  Sinus rhythm RSR' in V1 or V2, probably normal variant Confirmed by Jeneen Rinks  MD, [place] ([id number]) on 12/26/2016 11:09:04 AM       Radiology Dg Chest [place] 1 View  Result Date: 12/26/2016 CLINICAL DATA:  With increased shortness of breath, wheezing. EXAM: PORTABLE CHEST 1 VIEW COMPARISON:  10/15/2016 FINDINGS: Heart is borderline in size. Mild vascular congestion. Linear subsegmental atelectasis at the lung bases. No overt edema or effusions. IMPRESSION: Borderline cardiomegaly with vascular congestion. Bibasilar atelectasis. Electronically Signed   By: Rolm Baptise M.D.   On: 12/26/2016 11:53   Ct Renal Stone Study  Result Date: 12/26/2016 CLINICAL DATA:  Shortness of Breath. Upper abdominal pain. Worsening back pain. EXAM: CT ABDOMEN AND PELVIS WITHOUT CONTRAST TECHNIQUE: Multidetector CT imaging of the abdomen and pelvis was performed following the standard protocol without IV contrast. COMPARISON:  11/23/2014 FINDINGS: Lower chest: Calcifications throughout the visualized right coronary artery. Posterior right diaphragmatic hernia containing fat. Linear atelectasis or  scarring at the lung bases. No effusions. Hepatobiliary: Suspect mild fatty infiltration of the liver. No focal abnormality. Gallbladder grossly unremarkable. Pancreas: No focal abnormality or ductal dilatation. Spleen: No focal abnormality.  Normal size. Adrenals/Urinary Tract: Small cyst off the lower pole of the right kidney. No hydronephrosis. Adrenal glands and urinary bladder unremarkable. Stomach/Bowel: Diffuse descending colonic and sigmoid diverticulosis. No active diverticulitis. Stomach and small bowel decompressed, unremarkable. Vascular/Lymphatic: Diffuse aortic calcifications. No aneurysm. No adenopathy. Reproductive: Prior hysterectomy  status that Prior hysterectomy. No adnexal masses. Other: No free fluid or free air. Musculoskeletal: Postoperative changes from posterior spinal fusion from L1-L5, unchanged. No acute bony abnormality. IMPRESSION: Sigmoid diverticulosis.  No active diverticulitis. Suspect mild fatty infiltration of the liver. Aortic atherosclerosis. Postoperative changes in the lumbar spine. No acute findings. Electronically Signed   By: Rolm Baptise M.D.   On: 12/26/2016 11:44    Procedures Procedures (including critical care time)  Medications Ordered in ED Medications  albuterol (PROVENTIL) (2.5 MG/3ML) 0.083% nebulizer solution 5 mg (0 mg Nebulization Hold 12/26/16 1120)  albuterol (PROVENTIL) (2.5 MG/3ML) 0.083% nebulizer solution 2.5 mg (2.5 mg Nebulization Given 12/26/16 1120)  oseltamivir (TAMIFLU) capsule 75 mg (not administered)  HYDROcodone-acetaminophen (NORCO/VICODIN) 5-325 MG per tablet 1 tablet (1 tablet Oral Given 12/26/16 1118)  methylPREDNISolone sodium succinate (SOLU-MEDROL) 125 mg/2 mL injection 125 mg (125 mg Intravenous Given 12/26/16 1119)     Initial Impression / Assessment and Plan / ED Course  I have reviewed the triage vital signs and the nursing notes.  Pertinent labs & imaging results that were available during my care of the patient were  reviewed by me and considered in my medical decision making (see chart for details).  Clinical Course as of Dec 26 1309  Thu Dec 26, 2016  [place] 1 View [MJ]  1310 DG Chest [place] 1 View [MJ]    Clinical Course User Index [MJ] Tanna Furry, MD  patient is 92% on between 2-3 L nasal cannula. Symptoms have improved. Main complaint is still back pain. We'll treat her with steroids for COPD exacerbation. Tamiflu with consideration of her symptoms and the frequency of community influenza. Ultram for her back pain. Primary care follow-up. O2 at 2-1/2 L until recheck with primary care physician.    Final Clinical Impressions(s) / ED Diagnoses   Final diagnoses:  Dyspnea, unspecified type  Chronic midline low back pain without sciatica  Influenza    New Prescriptions New Prescriptions   OSELTAMIVIR (TAMIFLU) 75 MG CAPSULE    Take 1 capsule (75 mg total) by mouth every 12 (twelve) hours.   PREDNISONE (DELTASONE) 20 MG TABLET    Take 1 tablet (20 mg total) by mouth 2 (two) times daily with a meal.   TRAMADOL (ULTRAM) 50 MG TABLET    Take 1 tablet (50 mg total) by mouth every 6 (six) hours as needed.     Tanna Furry, MD 12/26/16 1311

## 2016-12-29 NOTE — Progress Notes (Signed)
HP The patient presents for follow up of CAD. She's had a history of coronary disease with multiple stents.   Since I last saw her she was in the ED last week.  I reviewed these records.  She had multiple complaints including back pain and worsening SOB.  She was treated with IV steroids and Ultram for back pain.  She has lots of somatic complaints today as well. However, she's not having left arm pain which was her previous angina. She is on chronic oxygen now and actually has had pulmonary function tests and has follow-up in a couple of weeks with pulmonary. She's not describing chest pain but mostly describing upper epigastric and abdominal discomfort. She's not having any PND or orthopnea. She's not describing palpitations, presyncope or syncope. She has lots of muscle aches and pains.   Allergies  Allergen Reactions  . Iohexol      Code: HIVES, Desc: PER ROBIN @ GI, PT IS ALLERGIC TO IVP DYE 08/28/10/RM, Onset Date: [id number]   . Lipitor [Atorvastatin Calcium] Hives    Hives per pt. She states she thinks this is the right medicine    Current Outpatient Prescriptions  Medication Sig Dispense Refill  . albuterol (PROVENTIL HFA;VENTOLIN HFA) 108 (90 BASE) MCG/ACT inhaler Inhale 2 puffs into the lungs every 4 (four) hours as needed for wheezing. 1 Inhaler 0  . amLODipine (NORVASC) 10 MG tablet Take 1 tablet (10 mg total) by mouth daily. 30 tablet 1  . aspirin 81 MG tablet Take 81 mg by mouth daily.    . cholecalciferol (VITAMIN D) 1000 UNITS tablet Take 1,000 Units by mouth daily.     Marland Kitchen guaiFENesin (MUCINEX) 600 MG 12 hr tablet Take 1 tablet (600 mg total) by mouth 2 (two) times daily. 14 tablet 0  . isosorbide mononitrate (IMDUR) 30 MG 24 hr tablet Take 1 tablet (30 mg total) by mouth daily. 90 tablet 3  . NITROSTAT 0.4 MG SL tablet DISSOLVE ONE TABLET UNDER THE TONGUE EVERY 5 MINUTES AS NEEDED FOR CHEST PAIN.  DO NOT EXCEED A TOTAL OF 3 DOSES IN 15 MINUTES 25 tablet 3  . oseltamivir  (TAMIFLU) 75 MG capsule Take 1 capsule (75 mg total) by mouth every 12 (twelve) hours. 10 capsule 0  . predniSONE (DELTASONE) 20 MG tablet Take 1 tablet (20 mg total) by mouth 2 (two) times daily with a meal. 10 tablet 0  . propranolol (INDERAL) 40 MG tablet Take 20 mg by mouth 2 (two) times daily.     . psyllium (METAMUCIL SMOOTH TEXTURE) 28 % packet Take 1 packet by mouth 2 (two) times daily.    . ranitidine (ZANTAC) 300 MG tablet     . tiotropium (SPIRIVA) 18 MCG inhalation capsule Place 1 capsule (18 mcg total) into inhaler and inhale daily. 30 capsule 1  . traMADol (ULTRAM) 50 MG tablet Take 1 tablet (50 mg total) by mouth every 6 (six) hours as needed. 15 tablet 0   No current facility-administered medications for this visit.     Past Medical History:  Diagnosis Date  . Arthritis    "aching bones sometimes"  . Blurred vision   . CAD (coronary artery disease)    Multiple percutaneous revascularization. Catheterization May 2013 left main normal, patent LAD stent, 90% circumflex stenosis, patent right coronary artery stents. Patient had a DES to the circumflex. The EF was well-preserved.  Marland Kitchen COPD (chronic obstructive pulmonary disease) ([place])   . Fatigue   . GERD (gastroesophageal reflux  disease)   . Hard of hearing   . HTN (hypertension)   . Hx of radiation therapy    right nose 4500 cGy in 10 sessions over 5 weeks (2 treatments a week)  . Hyperlipemia   . Squamous cell carcinoma of nose    right    Past Surgical History:  Procedure Laterality Date  . ABDOMINAL HYSTERECTOMY    . CORONARY ANGIOPLASTY WITH STENT PLACEMENT  2002-2009   x 5  . CORONARY ANGIOPLASTY WITH STENT PLACEMENT  05/06/12   "1; this makes 5"  . ESOPHAGEAL DILATION  ~ 2012   Dr. Deatra Ina  . LUMBAR SPINE SURGERY  04/2011   Rods, screws and cages  . PERCUTANEOUS CORONARY STENT INTERVENTION (PCI-S) N/A 05/06/2012   Procedure: PERCUTANEOUS CORONARY STENT INTERVENTION (PCI-S);  Surgeon: Burnell Blanks, MD;   Location: [facility] CATH LAB;  Service: Cardiovascular;  Laterality: N/A;  . SKIN BIOPSY  10/15/13   right nose-microinvasive squamous cell carcinoma    ROS:     As stated in the HPI and negative for all other systems.   PHYSICAL EXAM BP 140/74   Pulse 60   Ht 5\' 5"  (1.651 m)   Wt 199 lb 6 oz (90.4 kg)   BMI 33.18 kg/m  GENERAL:  In no acute distress.  HEENT:  Pupils equal round and reactive, fundi not visualized, oral mucosa unremarkable, dentures NECK:  No jugular venous distention, waveform within normal limits, carotid upstroke brisk and symmetric, no bruits, no thyromegaly LUNGS:  Clear to auscultation bilaterally BACK:  No CVA tenderness HEART:  PMI not displaced or sustained,S1 and S2 within normal limits, no S3, no S4, no clicks, no rubs, no murmurs ABD:  Flat, positive bowel sounds normal in frequency in pitch, no bruits, no rebound, no guarding, no midline pulsatile mass, no hepatomegaly, no splenomegaly EXT:  2 plus pulses throughout, no edema   ASSESSMENT AND PLAN  DYSPNEA - I think this is multifactorial.  I do not see a clear cardiac etiology as previously discussed. She has follow-up with pulmonary.  CAD -  The patient had a negative stress perfusion study in Jan 2016. She's not having any of her previous anginal symptoms. She ran out of Imdur and I will renew this.  Hyperlipemia -  She has not had her lipids checked since 2016.  I will repeat a lipid profile.  HTN (hypertension) -  The blood pressure is at target. No change in medications is indicated. We will continue with therapeutic lifestyle changes (TLC).  TOBACCO ABUSE -  She told me at the last visit that she quit smoking.   Her daughter verifies that she is still not smoking.    Recent ED records reviewed for this visit.

## 2016-12-30 ENCOUNTER — Ambulatory Visit (INDEPENDENT_AMBULATORY_CARE_PROVIDER_SITE_OTHER): Payer: Medicare Other | Admitting: Cardiology

## 2016-12-30 ENCOUNTER — Encounter: Payer: Self-pay | Admitting: Cardiology

## 2016-12-30 VITALS — BP 140/74 | HR 60 | Ht 65.0 in | Wt 199.4 lb

## 2016-12-30 DIAGNOSIS — E785 Hyperlipidemia, unspecified: Secondary | ICD-10-CM

## 2016-12-30 DIAGNOSIS — R0602 Shortness of breath: Secondary | ICD-10-CM

## 2016-12-30 DIAGNOSIS — Z79899 Other long term (current) drug therapy: Secondary | ICD-10-CM

## 2016-12-30 MED ORDER — ISOSORBIDE MONONITRATE ER 30 MG PO TB24: 30.0000 mg | ORAL_TABLET | Freq: Every day | ORAL | 3 refills | Status: DC

## 2016-12-30 NOTE — Patient Instructions (Addendum)
Medication Instructions:  Continue current medications  Labwork: Fasting Lipids Liver  Testing/Procedures: None Ordered  Follow-Up: Your physician wants you to follow-up in: 1 Year. You will receive a reminder letter in the mail two months in advance. If you don't receive a letter, please call our office to schedule the follow-up appointment.   Any Other Special Instructions Will Be Listed Below (If Applicable).   If you need a refill on your cardiac medications before your next appointment, please call your pharmacy.   

## 2017-01-13 ENCOUNTER — Ambulatory Visit: Payer: Medicare Other | Admitting: Pulmonary Disease

## 2017-01-29 ENCOUNTER — Emergency Department (HOSPITAL_COMMUNITY)
Admission: EM | Admit: 2017-01-29 | Discharge: 2017-01-29 | Disposition: A | Discharge status: Home / Self Care | Payer: Medicare Other | Attending: Emergency Medicine | Admitting: Emergency Medicine

## 2017-01-29 ENCOUNTER — Emergency Department (HOSPITAL_COMMUNITY): Payer: Medicare Other

## 2017-01-29 ENCOUNTER — Encounter (HOSPITAL_COMMUNITY): Payer: Self-pay

## 2017-01-29 DIAGNOSIS — Z87891 Personal history of nicotine dependence: Secondary | ICD-10-CM | POA: Diagnosis not present

## 2017-01-29 DIAGNOSIS — Z955 Presence of coronary angioplasty implant and graft: Secondary | ICD-10-CM | POA: Diagnosis not present

## 2017-01-29 DIAGNOSIS — J449 Chronic obstructive pulmonary disease, unspecified: Secondary | ICD-10-CM | POA: Diagnosis not present

## 2017-01-29 DIAGNOSIS — K5900 Constipation, unspecified: Secondary | ICD-10-CM | POA: Insufficient documentation

## 2017-01-29 DIAGNOSIS — Z7982 Long term (current) use of aspirin: Secondary | ICD-10-CM | POA: Diagnosis not present

## 2017-01-29 DIAGNOSIS — I2510 Atherosclerotic heart disease of native coronary artery without angina pectoris: Secondary | ICD-10-CM | POA: Diagnosis not present

## 2017-01-29 DIAGNOSIS — R109 Unspecified abdominal pain: Secondary | ICD-10-CM | POA: Diagnosis present

## 2017-01-29 DIAGNOSIS — M545 Low back pain: Secondary | ICD-10-CM

## 2017-01-29 DIAGNOSIS — I10 Essential (primary) hypertension: Secondary | ICD-10-CM | POA: Diagnosis not present

## 2017-01-29 DIAGNOSIS — Z79899 Other long term (current) drug therapy: Secondary | ICD-10-CM | POA: Diagnosis not present

## 2017-01-29 LAB — CBC
HCT: 44.2 % (ref 36.0–46.0)
Hemoglobin: 14.3 g/dL (ref 12.0–15.0)
MCH: 30.4 pg (ref 26.0–34.0)
MCHC: 32.4 g/dL (ref 30.0–36.0)
MCV: 94 fL (ref 78.0–100.0)
PLATELETS: 239 10*3/uL (ref 150–400)
RBC: 4.7 MIL/uL (ref 3.87–5.11)
RDW: 12.7 % (ref 11.5–15.5)
WBC: 8.2 10*3/uL (ref 4.0–10.5)

## 2017-01-29 LAB — URINALYSIS, ROUTINE W REFLEX MICROSCOPIC
Bilirubin Urine: NEGATIVE
GLUCOSE, UA: 50 mg/dL — AB
HGB URINE DIPSTICK: NEGATIVE
Ketones, ur: NEGATIVE mg/dL
LEUKOCYTES UA: NEGATIVE
NITRITE: NEGATIVE
PH: 5 (ref 5.0–8.0)
PROTEIN: 100 mg/dL — AB
SPECIFIC GRAVITY, URINE: 1.02 (ref 1.005–1.030)

## 2017-01-29 LAB — I-STAT CHEM 8, ED
BUN: 15 mg/dL (ref 6–20)
CALCIUM ION: 1.19 mmol/L (ref 1.15–1.40)
Chloride: 99 mmol/L — ABNORMAL LOW (ref 101–111)
Creatinine, Ser: 1.1 mg/dL — ABNORMAL HIGH (ref 0.44–1.00)
Glucose, Bld: 227 mg/dL — ABNORMAL HIGH (ref 65–99)
HEMATOCRIT: 44 % (ref 36.0–46.0)
HEMOGLOBIN: 15 g/dL (ref 12.0–15.0)
Potassium: 4.3 mmol/L (ref 3.5–5.1)
SODIUM: 136 mmol/L (ref 135–145)
TCO2: 26 mmol/L (ref 0–100)

## 2017-01-29 MED ORDER — FENTANYL CITRATE (PF) 100 MCG/2ML IJ SOLN
25.0000 ug | Freq: Once | INTRAMUSCULAR | Status: AC
  Administered 2017-01-29: 25 ug via INTRAVENOUS
  Filled 2017-01-29: qty 2

## 2017-01-29 MED ORDER — POLYETHYLENE GLYCOL 3350 17 G PO PACK: 17.0000 g | PACK | Freq: Every day | ORAL | 0 refills | Status: DC

## 2017-01-29 NOTE — ED Notes (Signed)
Pt is trying to call family for a ride home.

## 2017-01-29 NOTE — ED Triage Notes (Signed)
Pt. Reports that she has a chronic hx of constipation and recently having a problem moving her bowel  She moved her bowels this morning but has developed abdominal pain and back pain.    Pt. Reports that the pain is worse in the back.  She also has COPD, is on home oxygen 2 L [place]>  She is very sob with exertion.    She denies any chest pain.  She denies any cold symptoms or cough.  Pt. Received a breathing treatment en route.  Skin is warm and dry.  Pt. Is alert and oriented X 4.

## 2017-01-29 NOTE — ED Notes (Signed)
Pt taken to xray at this time.

## 2017-01-29 NOTE — Discharge Instructions (Signed)
Your lab work and x-ray overall reassuring. X-ray showed possible constipation. You have been given some medicine to help her bowel movements. Follow-up with your primary care doctor. Return for worsening symptoms or worsening trouble breathing. Return for fevers.

## 2017-01-29 NOTE — ED Notes (Signed)
Placed patient into a gown on the monitor did ekg shown to Dr Alvino Chapel

## 2017-01-29 NOTE — ED Provider Notes (Signed)
[facility] DEPT Provider Note   CSN: [id number] Arrival date & time: 01/29/17  1030     History   Chief Complaint Chief Complaint  Patient presents with  . Abdominal Pain  . Back Pain    HPI [name] is a 82 y.o. female.  HPI Patient presents with abdominal pain and back pain. Has had constipation recently. Did move her bowels this morning but has worsening pain. It is dull in her lower back. Similar to pain she has had in the past. No fevers. Has had shortness of breath but is on chronic oxygen and is chronically dyspneic. No fevers. No real change in her cough. No diarrhea. No dysuria. No relief with the Ultram at home.   Past Medical History:  Diagnosis Date  . Arthritis    "aching bones sometimes"  . Blurred vision   . CAD (coronary artery disease)    Multiple percutaneous revascularization. Catheterization May 2013 left main normal, patent LAD stent, 90% circumflex stenosis, patent right coronary artery stents. Patient had a DES to the circumflex. The EF was well-preserved.  Marland Kitchen COPD (chronic obstructive pulmonary disease) ([place])   . Fatigue   . GERD (gastroesophageal reflux disease)   . Hard of hearing   . HTN (hypertension)   . Hx of radiation therapy    right nose 4500 cGy in 10 sessions over 5 weeks (2 treatments a week)  . Hyperlipemia   . Squamous cell carcinoma of nose    right    Patient Active Problem List   Diagnosis Date Noted  . AP (abdominal pain)   . Atypical chest pain 11/28/2014  . Chest pain 11/28/2014  . Tobacco abuse 11/28/2014  . Skin cancer of nose   . Squamous cell carcinoma of skin of other and unspecified parts of face 10/15/2013  . COPD with acute exacerbation ([place]) 01/30/2013  . Abdominal pain 01/30/2013  . Constipation 01/30/2013  . Hypokalemia 01/30/2013  . Leukocytosis 05/14/2012  . HTN (hypertension) 05/14/2012  . Progressive angina ([place]) 05/07/2012  . Hyperlipemia   . Dysphagia, unspecified(787.20) 02/04/2012  .  Esophageal reflux 02/04/2012  . Coronary atherosclerosis 08/28/2010  . ANAL OR RECTAL PAIN 08/28/2010  . NAUSEA ALONE 08/28/2010    Past Surgical History:  Procedure Laterality Date  . ABDOMINAL HYSTERECTOMY    . CORONARY ANGIOPLASTY WITH STENT PLACEMENT  2002-2009   x 5  . CORONARY ANGIOPLASTY WITH STENT PLACEMENT  05/06/12   "1; this makes 5"  . ESOPHAGEAL DILATION  ~ 2012   Dr. Deatra Ina  . LUMBAR SPINE SURGERY  04/2011   Rods, screws and cages  . PERCUTANEOUS CORONARY STENT INTERVENTION (PCI-S) N/A 05/06/2012   Procedure: PERCUTANEOUS CORONARY STENT INTERVENTION (PCI-S);  Surgeon: Burnell Blanks, MD;  Location: [facility] CATH LAB;  Service: Cardiovascular;  Laterality: N/A;  . SKIN BIOPSY  10/15/13   right nose-microinvasive squamous cell carcinoma    OB History    No data available       Home Medications    Prior to Admission medications   Medication Sig Start Date End Date Taking? Authorizing Provider  albuterol (PROVENTIL HFA;VENTOLIN HFA) 108 (90 BASE) MCG/ACT inhaler Inhale 2 puffs into the lungs every 4 (four) hours as needed for wheezing. 12/01/14  Yes Delfina Redwood, MD  amLODipine (NORVASC) 10 MG tablet Take 1 tablet (10 mg total) by mouth daily. 12/01/14  Yes Delfina Redwood, MD  aspirin 81 MG tablet Take 81 mg by mouth daily as needed for pain.  Yes Historical Provider, MD  cholecalciferol (VITAMIN D) 1000 UNITS tablet Take 1,000 Units by mouth daily.    Yes Historical Provider, MD  guaiFENesin (MUCINEX) 600 MG 12 hr tablet Take 1 tablet (600 mg total) by mouth 2 (two) times daily. 02/03/13  Yes Kathie Dike, MD  isosorbide mononitrate (IMDUR) 30 MG 24 hr tablet Take 1 tablet (30 mg total) by mouth daily. 12/30/16  Yes Minus Breeding, MD  NITROSTAT 0.4 MG SL tablet DISSOLVE ONE TABLET UNDER THE TONGUE EVERY 5 MINUTES AS NEEDED FOR CHEST PAIN.  DO NOT EXCEED A TOTAL OF 3 DOSES IN 15 MINUTES 08/21/15  Yes Minus Breeding, MD  propranolol (INDERAL) 40 MG tablet  Take 20 mg by mouth 2 (two) times daily.    Yes Historical Provider, MD  psyllium (METAMUCIL SMOOTH TEXTURE) 28 % packet Take 1 packet by mouth 2 (two) times daily.   Yes Historical Provider, MD  ranitidine (ZANTAC) 300 MG tablet Take 300 mg by mouth at bedtime.  01/21/14  Yes Historical Provider, MD  traMADol (ULTRAM) 50 MG tablet Take 1 tablet (50 mg total) by mouth every 6 (six) hours as needed. 12/26/16  Yes Tanna Furry, MD  oseltamivir (TAMIFLU) 75 MG capsule Take 1 capsule (75 mg total) by mouth every 12 (twelve) hours. Patient not taking: Reported on 01/29/2017 12/26/16   Tanna Furry, MD  polyethylene glycol [facility]) packet Take 17 g by mouth daily. 01/29/17   Davonna Belling, MD  predniSONE (DELTASONE) 20 MG tablet Take 1 tablet (20 mg total) by mouth 2 (two) times daily with a meal. Patient not taking: Reported on 01/29/2017 12/26/16   Tanna Furry, MD  tiotropium (SPIRIVA) 18 MCG inhalation capsule Place 1 capsule (18 mcg total) into inhaler and inhale daily. Patient not taking: Reported on 01/29/2017 02/03/13   Kathie Dike, MD    Family History Family History  Problem Relation Age of Onset  . Glaucoma Father   . Heart attack Father   . Bone cancer Mother     Social History Social History  Substance Use Topics  . Smoking status: Former Smoker    Packs/day: 0.50    Years: 55.00    Types: Cigarettes  . Smokeless tobacco: Former Systems developer    Types: Snuff     Comment: Quit earlier this year.   . Alcohol use No     Allergies   Iohexol and Lipitor [atorvastatin calcium]   Review of Systems Review of Systems  Constitutional: Negative for appetite change.  HENT: Negative for congestion.   Respiratory: Positive for cough and shortness of breath.   Cardiovascular: Negative for chest pain.  Gastrointestinal: Positive for abdominal pain and constipation.  Genitourinary: Negative for dysuria.  Musculoskeletal: Positive for back pain.  Skin: Negative for wound.  Neurological: Negative for  tremors and numbness.  Hematological: Negative for adenopathy.  Psychiatric/Behavioral: Negative for confusion.     Physical Exam Updated Vital Signs BP 139/81   Pulse 91   Temp 97.6 F (36.4 C) (Oral)   Resp 24   Ht 5\' 5"  (1.651 m)   Wt 199 lb (90.3 kg)   SpO2 96%   BMI 33.12 kg/m   Physical Exam  Constitutional: She appears well-developed.  HENT:  Head: Atraumatic.  Eyes: EOM are normal.  Neck: Neck supple.  Cardiovascular: Normal rate.   Pulmonary/Chest:  Mildly harsh breath sounds throughout.  Abdominal: There is no tenderness.  Musculoskeletal: She exhibits tenderness.  Low back tenderness without step-off or deformity.  Skin: Skin  is warm. Capillary refill takes less than 2 seconds.  Psychiatric: She has a normal mood and affect.     ED Treatments / Results  Labs (all labs ordered are listed, but only abnormal results are displayed) Labs Reviewed  URINALYSIS, ROUTINE W REFLEX MICROSCOPIC - Abnormal; Notable for the following:       Result Value   Color, Urine AMBER (*)    APPearance CLOUDY (*)    Glucose, UA 50 (*)    Protein, ur 100 (*)    Bacteria, UA RARE (*)    Squamous Epithelial / LPF TOO NUMEROUS TO COUNT (*)    All other components within normal limits  I-STAT CHEM 8, ED - Abnormal; Notable for the following:    Chloride 99 (*)    Creatinine, Ser 1.10 (*)    Glucose, Bld 227 (*)    All other components within normal limits  CBC    EKG  EKG Interpretation  Date/Time:  Wednesday January 29 2017 10:42:12 EDT Ventricular Rate:  82 PR Interval:    QRS Duration: 97 QT Interval:  399 QTC Calculation: 466 R Axis:   88 Text Interpretation:  Sinus rhythm Consider right ventricular hypertrophy Nonspecific T abnormalities, lateral leads Baseline wander in lead(s) II Confirmed by Alvino Chapel  MD, Ashtynn Berke [phone]) on 01/29/2017 10:47:12 AM       Radiology Dg Abdomen Acute W/chest  Result Date: 01/29/2017 CLINICAL DATA:  Constipation. EXAM: DG  ABDOMEN ACUTE W/ 1V CHEST COMPARISON:  12/26/2016 . FINDINGS: Mediastinum hilar structures normal. Cardiomegaly. Normal pulmonary vascularity. Mild right base infiltrate cannot be excluded. No pleural effusion or pneumothorax. Soft tissue structures of the abdomen are unremarkable. Prominent stool volume. Constipation cannot be excluded . Nonspecific air-filled loops of small bowel noted. No prominent bowel distention or free air. Prior lumbar spine fusion . IMPRESSION: 1.  Prominent stool volume.  Constipation cannot be excluded. 2. Cardiomegaly.  Mild right base infiltrate cannot be excluded . Electronically Signed   By: Marcello Moores  Register   On: 01/29/2017 12:11    Procedures Procedures (including critical care time)  Medications Ordered in ED Medications  fentaNYL (SUBLIMAZE) injection 25 mcg (25 mcg Intravenous Given 01/29/17 1431)     Initial Impression / Assessment and Plan / ED Course  I have reviewed the triage vital signs and the nursing notes.  Pertinent labs & imaging results that were available during my care of the patient were reviewed by me and considered in my medical decision making (see chart for details).     Patient with low back pain abdominal pain. Has had history of same. Has had some constipation issues recently. Labs overall reassuring. X-ray shows a large amount of stool. Rectal exam did not show obstruction. Chronic shortness of breath. No new cough fevers or sputum production. X-ray showed possible pneumonia but will not empirically treat. Patient states she feels badly but I do not find a great reason for admission. Will discharge home with Miralax.  Final Clinical Impressions(s) / ED Diagnoses   Final diagnoses:  Low back pain, unspecified back pain laterality, unspecified chronicity, with sciatica presence unspecified  Abdominal pain, unspecified abdominal location  Constipation, unspecified constipation type    New Prescriptions New Prescriptions    POLYETHYLENE GLYCOL (MIRALAX) PACKET    Take 17 g by mouth daily.     Davonna Belling, MD 01/29/17 [phone]

## 2017-02-03 ENCOUNTER — Ambulatory Visit (INDEPENDENT_AMBULATORY_CARE_PROVIDER_SITE_OTHER): Payer: Medicare Other | Admitting: Pulmonary Disease

## 2017-02-03 ENCOUNTER — Encounter: Payer: Self-pay | Admitting: Pulmonary Disease

## 2017-02-03 VITALS — BP 136/66 | HR 74 | Ht 65.0 in | Wt 198.0 lb

## 2017-02-03 DIAGNOSIS — R05 Cough: Secondary | ICD-10-CM | POA: Diagnosis not present

## 2017-02-03 DIAGNOSIS — R059 Cough, unspecified: Secondary | ICD-10-CM

## 2017-02-03 DIAGNOSIS — J449 Chronic obstructive pulmonary disease, unspecified: Secondary | ICD-10-CM | POA: Diagnosis not present

## 2017-02-03 MED ORDER — ARFORMOTEROL TARTRATE 15 MCG/2ML IN NEBU: 15.0000 ug | INHALATION_SOLUTION | Freq: Two times a day (BID) | RESPIRATORY_TRACT | 11 refills | Status: DC

## 2017-02-03 MED ORDER — BUDESONIDE 0.25 MG/2ML IN SUSP: 0.2500 mg | Freq: Two times a day (BID) | RESPIRATORY_TRACT | 11 refills | Status: DC

## 2017-02-03 NOTE — Progress Notes (Signed)
[name]    [id number]    1934-12-06  Primary Care Physician:ELKINS,WILSON Danne Baxter, MD  Referring Physician: Leonard Downing, MD [address] [place], [place] [address]  Chief complaint:  Follow up for COPD GOLD B (CAT score 32, 0 exacerbations over past year)  HPI:  [name] is a 82 year old with past history as below. She has history of coronary artery disease with multiple stents. She had a stress test in January 2016 which was negative. She continues to complain of dyspnea and has been referred here by her cardiologist for further evaluation. She was started on supplemental oxygen at 2 L/m 2 weeks ago for desaturations.  She has extensive smoking history with a diagnosis of COPD. She is maintained on Spiriva inhaler. Her chief complaints are dyspnea on exertion. She denies any dyspnea at rest, cough, sputum production, wheezing. She has been maintained on Spiriva for the past 2 years and feels it helps with her breathing. She has about 30-pack-year smoking history and quit in 2016.  Interim history She complains of dyspnea on exertion that is unchanged since her last visit. She has complains of chronic cough with sputum production, wheezing. She has stopped using the Spiriva because it is not helping. She is given breo at last visit but is not using it. She continues on supplemental oxygen.  Outpatient Encounter Prescriptions as of 02/03/2017  Medication Sig  . albuterol (PROVENTIL HFA;VENTOLIN HFA) 108 (90 BASE) MCG/ACT inhaler Inhale 2 puffs into the lungs every 4 (four) hours as needed for wheezing.  Marland Kitchen amLODipine (NORVASC) 10 MG tablet Take 1 tablet (10 mg total) by mouth daily.  Marland Kitchen aspirin 81 MG tablet Take 81 mg by mouth daily as needed for pain.   . cholecalciferol (VITAMIN D) 1000 UNITS tablet Take 1,000 Units by mouth daily.   Marland Kitchen guaiFENesin (MUCINEX) 600 MG 12 hr tablet Take 1 tablet (600 mg total) by mouth 2 (two) times daily.  . isosorbide  mononitrate (IMDUR) 30 MG 24 hr tablet Take 1 tablet (30 mg total) by mouth daily.  Marland Kitchen NITROSTAT 0.4 MG SL tablet DISSOLVE ONE TABLET UNDER THE TONGUE EVERY 5 MINUTES AS NEEDED FOR CHEST PAIN.  DO NOT EXCEED A TOTAL OF 3 DOSES IN 15 MINUTES  . polyethylene glycol (MIRALAX) packet Take 17 g by mouth daily.  . psyllium (METAMUCIL SMOOTH TEXTURE) 28 % packet Take 1 packet by mouth 2 (two) times daily.  . ranitidine (ZANTAC) 300 MG tablet Take 300 mg by mouth at bedtime.   . traMADol (ULTRAM) 50 MG tablet Take 1 tablet (50 mg total) by mouth every 6 (six) hours as needed.  . [DISCONTINUED] oseltamivir (TAMIFLU) 75 MG capsule Take 1 capsule (75 mg total) by mouth every 12 (twelve) hours. (Patient not taking: Reported on 02/03/2017)  . [DISCONTINUED] predniSONE (DELTASONE) 20 MG tablet Take 1 tablet (20 mg total) by mouth 2 (two) times daily with a meal. (Patient not taking: Reported on 02/03/2017)  . [DISCONTINUED] propranolol (INDERAL) 40 MG tablet Take 20 mg by mouth 2 (two) times daily.   . [DISCONTINUED] tiotropium (SPIRIVA) 18 MCG inhalation capsule Place 1 capsule (18 mcg total) into inhaler and inhale daily. (Patient not taking: Reported on 02/03/2017)   No facility-administered encounter medications on file as of 02/03/2017.     Allergies as of 02/03/2017 - Review Complete 02/03/2017  Allergen Reaction Noted  . Iohexol  08/28/2010  . Lipitor [atorvastatin calcium] Hives 02/05/2012  Past Medical History:  Diagnosis Date  . Arthritis    "aching bones sometimes"  . Blurred vision   . CAD (coronary artery disease)    Multiple percutaneous revascularization. Catheterization May 2013 left main normal, patent LAD stent, 90% circumflex stenosis, patent right coronary artery stents. Patient had a DES to the circumflex. The EF was well-preserved.  Marland Kitchen COPD (chronic obstructive pulmonary disease) ([place])   . Fatigue   . GERD (gastroesophageal reflux disease)   . Hard of hearing   . HTN (hypertension)    . Hx of radiation therapy    right nose 4500 cGy in 10 sessions over 5 weeks (2 treatments a week)  . Hyperlipemia   . Squamous cell carcinoma of nose    right    Past Surgical History:  Procedure Laterality Date  . ABDOMINAL HYSTERECTOMY    . CORONARY ANGIOPLASTY WITH STENT PLACEMENT  2002-2009   x 5  . CORONARY ANGIOPLASTY WITH STENT PLACEMENT  05/06/12   "1; this makes 5"  . ESOPHAGEAL DILATION  ~ 2012   Dr. Deatra Ina  . LUMBAR SPINE SURGERY  04/2011   Rods, screws and cages  . PERCUTANEOUS CORONARY STENT INTERVENTION (PCI-S) N/A 05/06/2012   Procedure: PERCUTANEOUS CORONARY STENT INTERVENTION (PCI-S);  Surgeon: Burnell Blanks, MD;  Location[facility] CATH LAB;  Service: Cardiovascular;  Laterality: N/A;  . SKIN BIOPSY  10/15/13   right nose-microinvasive squamous cell carcinoma    Family History  Problem Relation Age of Onset  . Glaucoma Father   . Heart attack Father   . Bone cancer Mother     Social History   Social History  . Marital status: Widowed    Spouse name: N/A  . Number of children: 4  . Years of education: N/A   Occupational History  . retired Retired   Social History Main Topics  . Smoking status: Former Smoker    Packs/day: 0.50    Years: 55.00    Types: Cigarettes  . Smokeless tobacco: Former Systems developer    Types: Snuff     Comment: Quit earlier this year.   . Alcohol use No  . Drug use: No  . Sexual activity: No     Comment: 1 cig to 1/4 of a pack a day   Other Topics Concern  . Not on file   Social History Narrative  . No narrative on file    Review of systems: Review of Systems  Constitutional: Negative for fever and chills.  HENT: Negative.   Eyes: Negative for blurred vision.  Respiratory: as per HPI  Cardiovascular: Negative for chest pain and palpitations.  Gastrointestinal: Negative for vomiting, diarrhea, blood per rectum. Genitourinary: Negative for dysuria, urgency, frequency and hematuria.  Musculoskeletal: Negative for  myalgias, back pain and joint pain.  Skin: Negative for itching and rash.  Neurological: Negative for dizziness, tremors, focal weakness, seizures and loss of consciousness.  Endo/Heme/Allergies: Negative for environmental allergies.  Psychiatric/Behavioral: Negative for depression, suicidal ideas and hallucinations.  All other systems reviewed and are negative.  Physical Exam: Blood pressure 136/66, pulse 74, height 5\' 5"  (1.651 m), weight 198 lb (89.8 kg), SpO2 96 %. Gen:      No acute distress HEENT:  EOMI, sclera anicteric Neck:     No masses; no thyromegaly Lungs:    Mild exp wheeze; normal respiratory effort CV:         Regular rate and rhythm; no murmurs Abd:      + bowel sounds; soft,  non-tender; no palpable masses, no distension Ext:    No edema; adequate peripheral perfusion Skin:      Warm and dry; no rash Neuro: alert and oriented x 3 Psych: normal mood and affect  Data Reviewed: Chest x-ray 11/28/14-no acute cardiac pulmonary abnormality.  Chest x-ray 10/15/16-chronic COPD, stable left base scarring Chest x-ray 12/26/16- bibasal atelectasis Chest x-ray 3/40/18-mild right base opacity Abdominal CT 12/26/16-right diaphragm hernia, and basilar atelectasis or mild scarring I reviewed all images personally.  PFTs 10/27/16 FVC 1.82 (66%) FEV1 0.92 (44%) F/F 50 TLC 82% DLCO 19% Severe obstructive airway disease with broncho dilator response Severe diffusion defect  Assessment:  COPD GOLD B Her pulmonary function tests were reviewed with her today. She has severe COPD with a bronchodilator response. She'll benefit from optimization of her inhalers but unfortunately she stopped using the Breo and Spiriva and is just on albuterol when necessary. I'm not sure why she stopped using it but I suspect difficulty with use. Cost of her inhalers may be part of the reason  I'll start her on nebulizers with Brovana and Pulmicort, albuterol PRN. She will continue the supplemental  O2  Plan/Recommendations: - Start nebs with brovana, pulmicort - Albuterol nebs PRN  Return in 1-2 months.   Marshell Garfinkel MD [facility] Pager [phone] 02/03/2017, 2:05 PM  CC: Leonard Downing, *

## 2017-02-03 NOTE — Patient Instructions (Signed)
Start you on Brovana and Pulmicort nebulizer twice a day Will start you on albuterol nebulizer as needed Will order a nebulizer machine  Return to clinic in 1-2 months.

## 2017-02-12 ENCOUNTER — Telehealth: Payer: Self-pay | Admitting: Cardiology

## 2017-02-12 NOTE — Telephone Encounter (Signed)
Spoke to representative - informed her medication in question is being handle by  Pulmonologist Dr Vaughan Browner.   SHE STATES THE faxed prescription was transmitted by [facility] - phone [phone]  representative from [facility] states will note the information in the patient's information. [facility] is also aware

## 2017-02-12 NOTE — Telephone Encounter (Signed)
[facility] because she has a prescription that is dated 07/10/17 for Budesonide 0.25 mg . She is needing the clarification on the date and re-faxed to [phone].  Thanks

## 2017-04-07 ENCOUNTER — Ambulatory Visit: Payer: Medicare Other | Admitting: Pulmonary Disease

## 2017-05-09 ENCOUNTER — Emergency Department (HOSPITAL_COMMUNITY)
Admission: EM | Admit: 2017-05-09 | Discharge: 2017-05-09 | Disposition: A | Discharge status: Home / Self Care | Payer: Medicare Other | Attending: Emergency Medicine | Admitting: Emergency Medicine

## 2017-05-09 ENCOUNTER — Encounter (HOSPITAL_COMMUNITY): Payer: Self-pay | Admitting: *Deleted

## 2017-05-09 ENCOUNTER — Emergency Department (HOSPITAL_COMMUNITY): Payer: Medicare Other

## 2017-05-09 DIAGNOSIS — Z87891 Personal history of nicotine dependence: Secondary | ICD-10-CM | POA: Insufficient documentation

## 2017-05-09 DIAGNOSIS — I2510 Atherosclerotic heart disease of native coronary artery without angina pectoris: Secondary | ICD-10-CM | POA: Diagnosis not present

## 2017-05-09 DIAGNOSIS — R0602 Shortness of breath: Secondary | ICD-10-CM | POA: Diagnosis not present

## 2017-05-09 DIAGNOSIS — Z7982 Long term (current) use of aspirin: Secondary | ICD-10-CM | POA: Insufficient documentation

## 2017-05-09 DIAGNOSIS — Z79899 Other long term (current) drug therapy: Secondary | ICD-10-CM | POA: Diagnosis not present

## 2017-05-09 DIAGNOSIS — R1084 Generalized abdominal pain: Secondary | ICD-10-CM | POA: Diagnosis not present

## 2017-05-09 DIAGNOSIS — J449 Chronic obstructive pulmonary disease, unspecified: Secondary | ICD-10-CM | POA: Insufficient documentation

## 2017-05-09 DIAGNOSIS — I10 Essential (primary) hypertension: Secondary | ICD-10-CM | POA: Diagnosis not present

## 2017-05-09 DIAGNOSIS — R109 Unspecified abdominal pain: Secondary | ICD-10-CM | POA: Diagnosis present

## 2017-05-09 LAB — COMPREHENSIVE METABOLIC PANEL
ALT: 16 U/L (ref 14–54)
ANION GAP: 9 (ref 5–15)
AST: 19 U/L (ref 15–41)
Albumin: 3.8 g/dL (ref 3.5–5.0)
Alkaline Phosphatase: 65 U/L (ref 38–126)
BILIRUBIN TOTAL: 0.2 mg/dL — AB (ref 0.3–1.2)
BUN: 17 mg/dL (ref 6–20)
CHLORIDE: 100 mmol/L — AB (ref 101–111)
CO2: 29 mmol/L (ref 22–32)
Calcium: 9.1 mg/dL (ref 8.9–10.3)
Creatinine, Ser: 1.14 mg/dL — ABNORMAL HIGH (ref 0.44–1.00)
GFR, EST AFRICAN AMERICAN: 51 mL/min — AB (ref >60)
GFR, EST NON AFRICAN AMERICAN: 44 mL/min — AB (ref >60)
Glucose, Bld: 158 mg/dL — ABNORMAL HIGH (ref 65–99)
POTASSIUM: 3.7 mmol/L (ref 3.5–5.1)
Sodium: 138 mmol/L (ref 135–145)
TOTAL PROTEIN: 6.7 g/dL (ref 6.5–8.1)

## 2017-05-09 LAB — LIPASE, BLOOD: Lipase: 27 U/L (ref 11–51)

## 2017-05-09 LAB — CBC WITH DIFFERENTIAL/PLATELET
BASOS ABS: 0 10*3/uL (ref 0.0–0.1)
BASOS PCT: 0 %
Eosinophils Absolute: 0.1 10*3/uL (ref 0.0–0.7)
Eosinophils Relative: 1 %
HEMATOCRIT: 41.1 % (ref 36.0–46.0)
HEMOGLOBIN: 13.4 g/dL (ref 12.0–15.0)
Lymphocytes Relative: 17 %
Lymphs Abs: 1.5 10*3/uL (ref 0.7–4.0)
MCH: 30.3 pg (ref 26.0–34.0)
MCHC: 32.6 g/dL (ref 30.0–36.0)
MCV: 93 fL (ref 78.0–100.0)
MONO ABS: 0.8 10*3/uL (ref 0.1–1.0)
Monocytes Relative: 9 %
NEUTROS ABS: 6.6 10*3/uL (ref 1.7–7.7)
NEUTROS PCT: 73 %
Platelets: 227 10*3/uL (ref 150–400)
RBC: 4.42 MIL/uL (ref 3.87–5.11)
RDW: 12.9 % (ref 11.5–15.5)
WBC: 9.1 10*3/uL (ref 4.0–10.5)

## 2017-05-09 LAB — URINALYSIS, ROUTINE W REFLEX MICROSCOPIC
Bilirubin Urine: NEGATIVE
Glucose, UA: NEGATIVE mg/dL
Hgb urine dipstick: NEGATIVE
KETONES UR: NEGATIVE mg/dL
LEUKOCYTES UA: NEGATIVE
NITRITE: NEGATIVE
PH: 5 (ref 5.0–8.0)
Protein, ur: NEGATIVE mg/dL
Specific Gravity, Urine: 1.006 (ref 1.005–1.030)

## 2017-05-09 MED ORDER — ALUM & MAG HYDROXIDE-SIMETH 200-200-20 MG/5ML PO SUSP
30.0000 mL | Freq: Once | ORAL | Status: AC
  Administered 2017-05-09: 30 mL via ORAL
  Filled 2017-05-09: qty 30

## 2017-05-09 MED ORDER — ALBUTEROL SULFATE (2.5 MG/3ML) 0.083% IN NEBU
5.0000 mg | INHALATION_SOLUTION | Freq: Once | RESPIRATORY_TRACT | Status: AC
  Administered 2017-05-09: 5 mg via RESPIRATORY_TRACT
  Filled 2017-05-09: qty 6

## 2017-05-09 MED ORDER — SODIUM CHLORIDE 0.9 % IV BOLUS (SEPSIS)
500.0000 mL | Freq: Once | INTRAVENOUS | Status: AC
  Administered 2017-05-09: 500 mL via INTRAVENOUS

## 2017-05-09 MED ORDER — TRAMADOL HCL 50 MG PO TABS
50.0000 mg | ORAL_TABLET | Freq: Once | ORAL | Status: AC
  Administered 2017-05-09: 50 mg via ORAL
  Filled 2017-05-09: qty 1

## 2017-05-09 NOTE — ED Notes (Signed)
Pt transported to xray 

## 2017-05-09 NOTE — ED Notes (Signed)
Bed: WA06 Expected date:  Expected time:  Means of arrival:  Comments: EMS-COPD

## 2017-05-09 NOTE — ED Provider Notes (Signed)
[place] DEPT Provider Note   CSN: [id number] Arrival date & time: 05/09/17  1602     History   Chief Complaint Chief Complaint  Patient presents with  . Abdominal Pain    HPI [name] is a 82 y.o. female.  Patient is a 82 year old female with a history of COPD, oxygen dependent, coronary artery disease, hypertension and prior squamous cell carcinoma of the nose 2 presents with abdominal pain. She describes a 2 day history of worsening pain throughout her abdomen. She describes as a crampy pain. She's had some similar symptoms in the past. She states that she was constipated a few days ago but yesterday had a large amount of watery diarrhea. She denies any blood in her stool. No urinary symptoms other than she does say that her urine smells more strongly. She denies any fevers. She has a cough which is chronic for her. She has baseline shortness of breath but says is a little bit worse today. No nausea or vomiting. She has not taking any medications at home for the symptoms. She is normally on 2-3 L of oxygen at home by nasal cannula.      Past Medical History:  Diagnosis Date  . Arthritis    "aching bones sometimes"  . Blurred vision   . CAD (coronary artery disease)    Multiple percutaneous revascularization. Catheterization May 2013 left main normal, patent LAD stent, 90% circumflex stenosis, patent right coronary artery stents. Patient had a DES to the circumflex. The EF was well-preserved.  Marland Kitchen COPD (chronic obstructive pulmonary disease) ([place])   . Fatigue   . GERD (gastroesophageal reflux disease)   . Hard of hearing   . HTN (hypertension)   . Hx of radiation therapy    right nose 4500 cGy in 10 sessions over 5 weeks (2 treatments a week)  . Hyperlipemia   . Squamous cell carcinoma of nose    right    Patient Active Problem List   Diagnosis Date Noted  . AP (abdominal pain)   . Atypical chest pain 11/28/2014  . Chest pain 11/28/2014  . Tobacco abuse  11/28/2014  . Skin cancer of nose   . Squamous cell carcinoma of skin of other and unspecified parts of face 10/15/2013  . COPD, group B, by GOLD 2017 classification ([place]) 01/30/2013  . Abdominal pain 01/30/2013  . Constipation 01/30/2013  . Hypokalemia 01/30/2013  . Leukocytosis 05/14/2012  . HTN (hypertension) 05/14/2012  . Progressive angina ([place]) 05/07/2012  . Hyperlipemia   . Dysphagia, unspecified(787.20) 02/04/2012  . Esophageal reflux 02/04/2012  . Coronary atherosclerosis 08/28/2010  . ANAL OR RECTAL PAIN 08/28/2010  . NAUSEA ALONE 08/28/2010    Past Surgical History:  Procedure Laterality Date  . ABDOMINAL HYSTERECTOMY    . CORONARY ANGIOPLASTY WITH STENT PLACEMENT  2002-2009   x 5  . CORONARY ANGIOPLASTY WITH STENT PLACEMENT  05/06/12   "1; this makes 5"  . ESOPHAGEAL DILATION  ~ 2012   Dr. Deatra Ina  . LUMBAR SPINE SURGERY  04/2011   Rods, screws and cages  . PERCUTANEOUS CORONARY STENT INTERVENTION (PCI-S) N/A 05/06/2012   Procedure: PERCUTANEOUS CORONARY STENT INTERVENTION (PCI-S);  Surgeon: Burnell Blanks, MD;  Location: [facility] CATH LAB;  Service: Cardiovascular;  Laterality: N/A;  . SKIN BIOPSY  10/15/13   right nose-microinvasive squamous cell carcinoma    OB History    No data available       Home Medications    Prior to Admission medications  Medication Sig Start Date End Date Taking? Authorizing Provider  albuterol (PROVENTIL HFA;VENTOLIN HFA) 108 (90 BASE) MCG/ACT inhaler Inhale 2 puffs into the lungs every 4 (four) hours as needed for wheezing. 12/01/14  Yes Delfina Redwood, MD  amLODipine (NORVASC) 10 MG tablet Take 1 tablet (10 mg total) by mouth daily. 12/01/14  Yes Delfina Redwood, MD  arformoterol (BROVANA) 15 MCG/2ML NEBU Take 2 mLs (15 mcg total) by nebulization 2 (two) times daily. 02/03/17  Yes Mannam, Praveen, MD  aspirin EC 81 MG tablet Take 81 mg by mouth daily.   Yes [provider]  budesonide (PULMICORT) 0.25 MG/2ML  nebulizer solution Take 2 mLs (0.25 mg total) by nebulization 2 (two) times daily. 02/03/17 02/03/18 Yes Mannam, Praveen, MD  cholecalciferol (VITAMIN D) 1000 UNITS tablet Take 1,000 Units by mouth daily.    Yes [provider]  guaiFENesin (MUCINEX) 600 MG 12 hr tablet Take 1 tablet (600 mg total) by mouth 2 (two) times daily. 02/03/13  Yes Kathie Dike, MD  isosorbide mononitrate (IMDUR) 30 MG 24 hr tablet Take 1 tablet (30 mg total) by mouth daily. 12/30/16  Yes Minus Breeding, MD  nitroGLYCERIN (NITROSTAT) 0.4 MG SL tablet Place 0.4 mg under the tongue every 5 (five) minutes as needed for chest pain.   Yes [provider]  nortriptyline (PAMELOR) 25 MG capsule Take 25 mg by mouth 2 (two) times daily.   Yes [provider]  polyethylene glycol (MIRALAX) packet Take 17 g by mouth daily. 01/29/17  Yes Davonna Belling, MD  psyllium (METAMUCIL SMOOTH TEXTURE) 28 % packet Take 1 packet by mouth 2 (two) times daily.   Yes [provider]  ranitidine (ZANTAC) 300 MG tablet Take 300 mg by mouth at bedtime.    Yes [provider]  traMADol (ULTRAM) 50 MG tablet Take 1 tablet (50 mg total) by mouth every 6 (six) hours as needed. Patient taking differently: Take 50 mg by mouth every 6 (six) hours as needed for moderate pain.  12/26/16  Yes Tanna Furry, MD    Family History Family History  Problem Relation Age of Onset  . Glaucoma Father   . Heart attack Father   . Bone cancer Mother     Social History Social History  Substance Use Topics  . Smoking status: Former Smoker    Packs/day: 0.50    Years: 55.00    Types: Cigarettes  . Smokeless tobacco: Former Systems developer    Types: Snuff     Comment: Quit earlier this year.   . Alcohol use No     Allergies   Iohexol and Lipitor [atorvastatin calcium]   Review of Systems Review of Systems  Constitutional: Negative for chills, diaphoresis, fatigue and fever.  HENT: Negative for congestion, rhinorrhea and  sneezing.   Eyes: Negative.   Respiratory: Positive for cough and shortness of breath. Negative for chest tightness.   Cardiovascular: Negative for chest pain and leg swelling.  Gastrointestinal: Positive for abdominal pain, constipation and diarrhea. Negative for blood in stool, nausea and vomiting.  Genitourinary: Negative for difficulty urinating, flank pain, frequency and hematuria.  Musculoskeletal: Negative for arthralgias and back pain.  Skin: Negative for rash.  Neurological: Negative for dizziness, speech difficulty, weakness, numbness and headaches.     Physical Exam Updated Vital Signs BP (!) 160/93 (BP Location: Left Arm)   Pulse 73   Temp 97.6 F (36.4 C) (Oral)   Resp (!) 23   SpO2 99%   Physical Exam  Constitutional: She is oriented to person, place, and time. She appears well-developed and well-nourished.  HENT:  Head: Normocephalic and atraumatic.  Eyes: Pupils are equal, round, and reactive to light.  Neck: Normal range of motion. Neck supple.  Cardiovascular: Normal rate, regular rhythm and normal heart sounds.   Pulmonary/Chest: Effort normal and breath sounds normal. No respiratory distress. She has no wheezes. She has no rales. She exhibits no tenderness.  Abdominal: Soft. Bowel sounds are normal. There is no tenderness. There is no rebound and no guarding.  Musculoskeletal: Normal range of motion. She exhibits no edema.  Lymphadenopathy:    She has no cervical adenopathy.  Neurological: She is alert and oriented to person, place, and time.  Skin: Skin is warm and dry. No rash noted.  Psychiatric: She has a normal mood and affect.     ED Treatments / Results  Labs (all labs ordered are listed, but only abnormal results are displayed) Labs Reviewed  COMPREHENSIVE METABOLIC PANEL - Abnormal; Notable for the following:       Result Value   Chloride 100 (*)    Glucose, Bld 158 (*)    Creatinine, Ser 1.14 (*)    Total Bilirubin 0.2 (*)    GFR calc non  Af Amer 44 (*)    GFR calc Af Amer 51 (*)    All other components within normal limits  LIPASE, BLOOD  CBC WITH DIFFERENTIAL/PLATELET  URINALYSIS, ROUTINE W REFLEX MICROSCOPIC    EKG  EKG Interpretation  Date/Time:  Friday May 09 2017 16:19:43 EDT Ventricular Rate:  80 PR Interval:    QRS Duration: 115 QT Interval:  426 QTC Calculation: 492 R Axis:   88 Text Interpretation:  Sinus rhythm Incomplete right bundle branch block since last tracing no significant change Confirmed by Malvin Johns [phone]) on 05/09/2017 4:57:41 PM       Radiology Dg Abd Acute W/chest  Result Date: 05/09/2017 CLINICAL DATA:  LEFT upper quadrant pain for 3 days, foul-smelling urine, shortness of breath, COPD, hypertension, coronary artery disease EXAM: DG ABDOMEN ACUTE W/ 1V CHEST COMPARISON:  01/29/2017 FINDINGS: Enlargement of cardiac silhouette. Atherosclerotic calcification aorta. Mediastinal contours and pulmonary vascularity normal. Minimal RIGHT basilar atelectasis versus infiltrate. Lungs otherwise clear. No pleural effusion or pneumothorax. Nonobstructive bowel gas pattern. No bowel dilatation, bowel wall thickening or free air. Prior lumbar fusion. Diffuse osseous demineralization. No definite urinary tract calcification. IMPRESSION: Enlargement of cardiac silhouette. Minimal atelectasis versus infiltrate at RIGHT base. No acute abdominal findings. Aortic Atherosclerosis (ICD10-I70.0) and Emphysema (ICD10-J43.9). Electronically Signed   By: Lavonia Dana M.D.   On: 05/09/2017 17:20    Procedures Procedures (including critical care time)  Medications Ordered in ED Medications  alum & mag hydroxide-simeth (MAALOX/MYLANTA) 200-200-20 MG/5ML suspension 30 mL (30 mLs Oral Given 05/09/17 1718)  sodium chloride 0.9 % bolus 500 mL (0 mLs Intravenous Stopped 05/09/17 1935)  albuterol (PROVENTIL) (2.5 MG/3ML) 0.083% nebulizer solution 5 mg (5 mg Nebulization Given 05/09/17 2103)  traMADol (ULTRAM) tablet 50 mg  (50 mg Oral Given 05/09/17 2102)     Initial Impression / Assessment and Plan / ED Course  I have reviewed the triage vital signs and the nursing notes.  Pertinent labs & imaging results that were available during my care of the patient were reviewed by me and considered in my medical decision making (see chart for details).     Patient presents with abdominal pain. Her abdomen is not really tender on initial exam. Repeat exam reveals  no tenderness. There is no suggestions of constipation. Her labs are non-concerning. There is no fever. No evidence of a urinary tract infection. She also reports some shortness of breath. She has no tachypnea. She's talking in full sentences. Her lungs show some mild wheezing which improved after nebulizer treatment. She is able to ambulate without hypoxia or significant shortness of breath. Her chest x-ray doesn't show any clear evidence of pneumonia. Feel that she can be discharged with close follow-up with her PCP. Her sisters, stay with her tonight. Return precautions were given.  Final Clinical Impressions(s) / ED Diagnoses   Final diagnoses:  Shortness of breath  Generalized abdominal pain    New Prescriptions New Prescriptions   No medications on file     Malvin Johns, MD 05/09/17 2210

## 2017-05-09 NOTE — ED Triage Notes (Signed)
Per EMS, pt complains of LUQ pain x 3 days, foul smelling urine and SOB. Pt has hx of COPD. 12 lead unremarkable. Pt given 5mg  of albuterol. Pt is on 3L [place] at home.   BP 180/74 HR 82 RR 22 O2 92% on 3L [place]

## 2017-05-09 NOTE — ED Notes (Signed)
O2 sats were 92% while ambulating.

## 2017-05-15 ENCOUNTER — Ambulatory Visit
Admission: RE | Admit: 2017-05-15 | Discharge: 2017-05-15 | Disposition: A | Discharge status: Home / Self Care | Payer: Medicare Other | Source: Ambulatory Visit | Attending: Family Medicine | Admitting: Family Medicine

## 2017-05-15 ENCOUNTER — Other Ambulatory Visit: Payer: Self-pay | Admitting: Family Medicine

## 2017-05-15 DIAGNOSIS — R059 Cough, unspecified: Secondary | ICD-10-CM

## 2017-05-15 DIAGNOSIS — R05 Cough: Secondary | ICD-10-CM

## 2017-06-20 ENCOUNTER — Other Ambulatory Visit: Payer: Self-pay | Admitting: Family Medicine

## 2017-06-20 DIAGNOSIS — R109 Unspecified abdominal pain: Secondary | ICD-10-CM

## 2017-06-23 ENCOUNTER — Telehealth: Payer: Self-pay | Admitting: Radiology

## 2017-06-23 NOTE — Telephone Encounter (Signed)
Called 13 hour prep to [facility] [phone]. Called pt and spoke to her and caretaker. Will call if any questions about pre med.

## 2017-06-25 ENCOUNTER — Ambulatory Visit
Admission: RE | Admit: 2017-06-25 | Discharge: 2017-06-25 | Disposition: A | Discharge status: Home / Self Care | Payer: Medicare Other | Source: Ambulatory Visit | Attending: Family Medicine | Admitting: Family Medicine

## 2017-06-25 ENCOUNTER — Other Ambulatory Visit: Payer: Self-pay | Admitting: Family Medicine

## 2017-06-25 DIAGNOSIS — R109 Unspecified abdominal pain: Secondary | ICD-10-CM

## 2017-06-25 DIAGNOSIS — Z09 Encounter for follow-up examination after completed treatment for conditions other than malignant neoplasm: Secondary | ICD-10-CM

## 2017-06-25 MED ORDER — IOPAMIDOL (ISOVUE-300) INJECTION 61%
100.0000 mL | Freq: Once | INTRAVENOUS | Status: AC | PRN
  Administered 2017-06-25: 100 mL via INTRAVENOUS

## 2017-08-16 ENCOUNTER — Emergency Department (HOSPITAL_COMMUNITY): Payer: Medicare Other

## 2017-08-16 ENCOUNTER — Emergency Department (HOSPITAL_COMMUNITY)
Admission: EM | Admit: 2017-08-16 | Discharge: 2017-08-16 | Disposition: A | Discharge status: Home / Self Care | Payer: Medicare Other | Attending: Emergency Medicine | Admitting: Emergency Medicine

## 2017-08-16 DIAGNOSIS — Z87891 Personal history of nicotine dependence: Secondary | ICD-10-CM | POA: Insufficient documentation

## 2017-08-16 DIAGNOSIS — K6289 Other specified diseases of anus and rectum: Secondary | ICD-10-CM | POA: Insufficient documentation

## 2017-08-16 DIAGNOSIS — K5900 Constipation, unspecified: Secondary | ICD-10-CM

## 2017-08-16 DIAGNOSIS — J449 Chronic obstructive pulmonary disease, unspecified: Secondary | ICD-10-CM | POA: Diagnosis not present

## 2017-08-16 DIAGNOSIS — I2510 Atherosclerotic heart disease of native coronary artery without angina pectoris: Secondary | ICD-10-CM | POA: Diagnosis not present

## 2017-08-16 DIAGNOSIS — I10 Essential (primary) hypertension: Secondary | ICD-10-CM | POA: Diagnosis not present

## 2017-08-16 DIAGNOSIS — R1084 Generalized abdominal pain: Secondary | ICD-10-CM

## 2017-08-16 DIAGNOSIS — Z79899 Other long term (current) drug therapy: Secondary | ICD-10-CM | POA: Insufficient documentation

## 2017-08-16 DIAGNOSIS — E785 Hyperlipidemia, unspecified: Secondary | ICD-10-CM | POA: Diagnosis not present

## 2017-08-16 DIAGNOSIS — K5909 Other constipation: Secondary | ICD-10-CM | POA: Diagnosis not present

## 2017-08-16 LAB — CBC WITH DIFFERENTIAL/PLATELET
Basophils Absolute: 0 10*3/uL (ref 0.0–0.1)
Basophils Relative: 0 %
EOS ABS: 0.1 10*3/uL (ref 0.0–0.7)
Eosinophils Relative: 1 %
HCT: 43.9 % (ref 36.0–46.0)
HEMOGLOBIN: 13.9 g/dL (ref 12.0–15.0)
LYMPHS ABS: 1.2 10*3/uL (ref 0.7–4.0)
LYMPHS PCT: 15 %
MCH: 29.8 pg (ref 26.0–34.0)
MCHC: 31.7 g/dL (ref 30.0–36.0)
MCV: 94.2 fL (ref 78.0–100.0)
Monocytes Absolute: 0.5 10*3/uL (ref 0.1–1.0)
Monocytes Relative: 6 %
NEUTROS PCT: 78 %
Neutro Abs: 6.3 10*3/uL (ref 1.7–7.7)
Platelets: 216 10*3/uL (ref 150–400)
RBC: 4.66 MIL/uL (ref 3.87–5.11)
RDW: 13.2 % (ref 11.5–15.5)
WBC: 8.1 10*3/uL (ref 4.0–10.5)

## 2017-08-16 LAB — COMPREHENSIVE METABOLIC PANEL
ALT: 18 U/L (ref 14–54)
ANION GAP: 9 (ref 5–15)
AST: 19 U/L (ref 15–41)
Albumin: 3.8 g/dL (ref 3.5–5.0)
Alkaline Phosphatase: 58 U/L (ref 38–126)
BUN: 20 mg/dL (ref 6–20)
CALCIUM: 9.1 mg/dL (ref 8.9–10.3)
CHLORIDE: 102 mmol/L (ref 101–111)
CO2: 30 mmol/L (ref 22–32)
Creatinine, Ser: 1.05 mg/dL — ABNORMAL HIGH (ref 0.44–1.00)
GFR calc non Af Amer: 49 mL/min — ABNORMAL LOW (ref >60)
GFR, EST AFRICAN AMERICAN: 57 mL/min — AB (ref >60)
Glucose, Bld: 126 mg/dL — ABNORMAL HIGH (ref 65–99)
POTASSIUM: 3.7 mmol/L (ref 3.5–5.1)
SODIUM: 141 mmol/L (ref 135–145)
Total Bilirubin: 0.2 mg/dL — ABNORMAL LOW (ref 0.3–1.2)
Total Protein: 6.6 g/dL (ref 6.5–8.1)

## 2017-08-16 LAB — LIPASE, BLOOD: Lipase: 24 U/L (ref 11–51)

## 2017-08-16 MED ORDER — SODIUM CHLORIDE 0.9 % IV SOLN
Freq: Once | INTRAVENOUS | Status: AC
  Administered 2017-08-16: 12:00:00 via INTRAVENOUS

## 2017-08-16 MED ORDER — POLYETHYLENE GLYCOL 3350 17 G PO PACK: 17.0000 g | PACK | Freq: Every day | ORAL | 2 refills | Status: DC

## 2017-08-16 MED ORDER — POLYETHYLENE GLYCOL 3350 17 G PO PACK
17.0000 g | PACK | Freq: Every day | ORAL | Status: DC
  Administered 2017-08-16: 17 g via ORAL
  Filled 2017-08-16: qty 1

## 2017-08-16 MED ORDER — AMLODIPINE BESYLATE 5 MG PO TABS
10.0000 mg | ORAL_TABLET | Freq: Once | ORAL | Status: AC
  Administered 2017-08-16: 10 mg via ORAL
  Filled 2017-08-16: qty 2

## 2017-08-16 MED ORDER — DOCUSATE SODIUM 100 MG PO CAPS: 100.0000 mg | ORAL_CAPSULE | Freq: Two times a day (BID) | ORAL | 2 refills | Status: DC

## 2017-08-16 MED ORDER — MILK AND MOLASSES ENEMA
1.0000 | Freq: Once | RECTAL | Status: AC
  Administered 2017-08-16: 250 mL via RECTAL
  Filled 2017-08-16: qty 250

## 2017-08-16 MED ORDER — DICYCLOMINE HCL 10 MG PO CAPS
10.0000 mg | ORAL_CAPSULE | Freq: Once | ORAL | Status: AC
  Administered 2017-08-16: 10 mg via ORAL
  Filled 2017-08-16: qty 1

## 2017-08-16 MED ORDER — ISOSORBIDE MONONITRATE ER 30 MG PO TB24
30.0000 mg | ORAL_TABLET | Freq: Once | ORAL | Status: AC
  Administered 2017-08-16: 30 mg via ORAL
  Filled 2017-08-16 (×2): qty 1

## 2017-08-16 MED ORDER — DICYCLOMINE HCL 20 MG PO TABS: 20.0000 mg | ORAL_TABLET | Freq: Three times a day (TID) | ORAL | 0 refills | Status: AC | PRN

## 2017-08-16 MED ORDER — IPRATROPIUM-ALBUTEROL 0.5-2.5 (3) MG/3ML IN SOLN
3.0000 mL | Freq: Once | RESPIRATORY_TRACT | Status: AC
  Administered 2017-08-16: 3 mL via RESPIRATORY_TRACT
  Filled 2017-08-16: qty 3

## 2017-08-16 MED ORDER — MORPHINE SULFATE (PF) 4 MG/ML IV SOLN
4.0000 mg | Freq: Once | INTRAVENOUS | Status: AC
  Administered 2017-08-16: 4 mg via INTRAVENOUS
  Filled 2017-08-16: qty 1

## 2017-08-16 NOTE — ED Notes (Signed)
External catheter applied to collect urine sample.

## 2017-08-16 NOTE — ED Triage Notes (Signed)
Per EMS, pt is coming from home with complaints of abdominal pain and rectal pain. Pt reports having the urge to have a bowel movement but cannot have one. Pt last bowel movement on 9/27. Pt also reports having a dark stool. Per EMS pt has RUQ abdominal pain and tenderness. Pt reports nausea and no nausea. Pt has a hx of COPD, cardiac stents, and hypertension. Pt is allergic to iv contrast.

## 2017-08-16 NOTE — ED Provider Notes (Signed)
[facility] DEPT Provider Note   CSN: [id number] Arrival date & time: 08/16/17  6962     History   Chief Complaint Chief Complaint  Patient presents with  . Abdominal Pain    HPI [name] is a 82 y.o. female.  HPI Patient reports she's having cramping abdominal pain and rectal pain. She reports she goes and tries to have a bowel movement and strains but only a small amount will come out. She reports last bowel movement was 2 days ago. She reports sometimes she has leakage of dark stool. She reports she has felt nauseated but has had no episodes of vomiting. She reports she has had problems with constipation. She describes taking medication for constipation but it sounds sporadic. She does not give a detailed report of which medications she has tried for her constipation. She does note that she ran out of tramadol which is the main pain medication that she typically has. She reports she tried some aspirin or Tylenol here and there and gotten limited relief. She denies pain or difficulty with urination. No fevers. Past Medical History:  Diagnosis Date  . Arthritis    "aching bones sometimes"  . Blurred vision   . CAD (coronary artery disease)    Multiple percutaneous revascularization. Catheterization May 2013 left main normal, patent LAD stent, 90% circumflex stenosis, patent right coronary artery stents. Patient had a DES to the circumflex. The EF was well-preserved.  Marland Kitchen COPD (chronic obstructive pulmonary disease) ([place])   . Fatigue   . GERD (gastroesophageal reflux disease)   . Hard of hearing   . HTN (hypertension)   . Hx of radiation therapy    right nose 4500 cGy in 10 sessions over 5 weeks (2 treatments a week)  . Hyperlipemia   . Squamous cell carcinoma of nose    right    Patient Active Problem List   Diagnosis Date Noted  . AP (abdominal pain)   . Atypical chest pain 11/28/2014  . Chest pain 11/28/2014  . Tobacco abuse 11/28/2014  . Skin cancer of nose     . Squamous cell carcinoma of skin of other and unspecified parts of face 10/15/2013  . COPD, group B, by GOLD 2017 classification ([place]) 01/30/2013  . Abdominal pain 01/30/2013  . Constipation 01/30/2013  . Hypokalemia 01/30/2013  . Leukocytosis 05/14/2012  . HTN (hypertension) 05/14/2012  . Progressive angina ([place]) 05/07/2012  . Hyperlipemia   . Dysphagia, unspecified(787.20) 02/04/2012  . Esophageal reflux 02/04/2012  . Coronary atherosclerosis 08/28/2010  . ANAL OR RECTAL PAIN 08/28/2010  . NAUSEA ALONE 08/28/2010    Past Surgical History:  Procedure Laterality Date  . ABDOMINAL HYSTERECTOMY    . CORONARY ANGIOPLASTY WITH STENT PLACEMENT  2002-2009   x 5  . CORONARY ANGIOPLASTY WITH STENT PLACEMENT  05/06/12   "1; this makes 5"  . ESOPHAGEAL DILATION  ~ 2012   Dr. Deatra Ina  . LUMBAR SPINE SURGERY  04/2011   Rods, screws and cages  . PERCUTANEOUS CORONARY STENT INTERVENTION (PCI-S) N/A 05/06/2012   Procedure: PERCUTANEOUS CORONARY STENT INTERVENTION (PCI-S);  Surgeon: Burnell Blanks, MD;  Location: [facility] CATH LAB;  Service: Cardiovascular;  Laterality: N/A;  . SKIN BIOPSY  10/15/13   right nose-microinvasive squamous cell carcinoma    OB History    No data available       Home Medications    Prior to Admission medications   Medication Sig Start Date End Date Taking? Authorizing Provider  albuterol (PROVENTIL HFA;VENTOLIN  HFA) 108 (90 BASE) MCG/ACT inhaler Inhale 2 puffs into the lungs every 4 (four) hours as needed for wheezing. 12/01/14   Delfina Redwood, MD  amLODipine (NORVASC) 10 MG tablet Take 1 tablet (10 mg total) by mouth daily. 12/01/14   Delfina Redwood, MD  arformoterol (BROVANA) 15 MCG/2ML NEBU Take 2 mLs (15 mcg total) by nebulization 2 (two) times daily. 02/03/17   Mannam, Hart Robinsons, MD  aspirin EC 81 MG tablet Take 81 mg by mouth daily.    [provider]  budesonide (PULMICORT) 0.25 MG/2ML nebulizer solution Take 2 mLs (0.25 mg total) by  nebulization 2 (two) times daily. 02/03/17 02/03/18  Marshell Garfinkel, MD  cholecalciferol (VITAMIN D) 1000 UNITS tablet Take 1,000 Units by mouth daily.     [provider]  dicyclomine (BENTYL) 20 MG tablet Take 1 tablet (20 mg total) by mouth 3 (three) times daily as needed for spasms. Cramping abdominal pain. 08/16/17   Charlesetta Shanks, MD  docusate sodium (COLACE) 100 MG capsule Take 1 capsule (100 mg total) by mouth every 12 (twelve) hours. 08/16/17   Charlesetta Shanks, MD  guaiFENesin (MUCINEX) 600 MG 12 hr tablet Take 1 tablet (600 mg total) by mouth 2 (two) times daily. 02/03/13   Kathie Dike, MD  isosorbide mononitrate (IMDUR) 30 MG 24 hr tablet Take 1 tablet (30 mg total) by mouth daily. 12/30/16   Minus Breeding, MD  nitroGLYCERIN (NITROSTAT) 0.4 MG SL tablet Place 0.4 mg under the tongue every 5 (five) minutes as needed for chest pain.    [provider]  nortriptyline (PAMELOR) 25 MG capsule Take 25 mg by mouth 2 (two) times daily.    [provider]  polyethylene glycol (MIRALAX / GLYCOLAX) packet Take 17 g by mouth daily. 08/16/17   Charlesetta Shanks, MD  polyethylene glycol [facility]) packet Take 17 g by mouth daily. 01/29/17   Davonna Belling, MD  psyllium (METAMUCIL SMOOTH TEXTURE) 28 % packet Take 1 packet by mouth 2 (two) times daily.    [provider]  ranitidine (ZANTAC) 300 MG tablet Take 300 mg by mouth at bedtime.     [provider]  traMADol (ULTRAM) 50 MG tablet Take 1 tablet (50 mg total) by mouth every 6 (six) hours as needed. Patient taking differently: Take 50 mg by mouth every 6 (six) hours as needed for moderate pain.  12/26/16   Tanna Furry, MD    Family History Family History  Problem Relation Age of Onset  . Glaucoma Father   . Heart attack Father   . Bone cancer Mother     Social History Social History  Substance Use Topics  . Smoking status: Former Smoker    Packs/day: 0.50    Years: 55.00    Types: Cigarettes    . Smokeless tobacco: Former Systems developer    Types: Snuff     Comment: Quit earlier this year.   . Alcohol use No     Allergies   Iohexol and Lipitor [atorvastatin calcium]   Review of Systems Review of Systems 10 Systems reviewed and are negative for acute change except as noted in the HPI.   Physical Exam Updated Vital Signs BP (!) 167/87   Pulse 69   Temp (!) 97.5 F (36.4 C) (Oral)   Resp (!) 22   SpO2 98%   Physical Exam  Constitutional: She is oriented to person, place, and time.  Patient is alert and nontoxic. No respiratory distress. Somewhat tearful and querulous.  HENT:  Head: Normocephalic and atraumatic.  Eyes: Conjunctivae and EOM are normal.  Cardiovascular: Normal rate, regular rhythm, normal heart sounds and intact distal pulses.   Pulmonary/Chest:  Good airflow throughout. Occasional expiratory wheeze.  Abdominal: Soft. Bowel sounds are normal.  Abdomen is obese. Patient endorses tenderness throughout. There is no guarding or rebound. Abdomen is soft.  Genitourinary:  Genitourinary Comments: Rectal exam normal. Few, small formed stool pieces in the rectal vault. These are normal brown color and consistency. No fecal impaction. No visible blood or melena.  Musculoskeletal: Normal range of motion. She exhibits no edema, tenderness or deformity.  Neurological: She is alert and oriented to person, place, and time. No cranial nerve deficit. She exhibits normal muscle tone. Coordination normal.  Skin: Skin is warm and dry.     ED Treatments / Results  Labs (all labs ordered are listed, but only abnormal results are displayed) Labs Reviewed  COMPREHENSIVE METABOLIC PANEL - Abnormal; Notable for the following:       Result Value   Glucose, Bld 126 (*)    Creatinine, Ser 1.05 (*)    Total Bilirubin 0.2 (*)    GFR calc non Af Amer 49 (*)    GFR calc Af Amer 57 (*)    All other components within normal limits  LIPASE, BLOOD  CBC WITH DIFFERENTIAL/PLATELET   URINALYSIS, ROUTINE W REFLEX MICROSCOPIC    EKG  EKG Interpretation None       Radiology Dg Abd Acute W/chest  Result Date: 08/16/2017 CLINICAL DATA:  Abdominal pain EXAM: DG ABDOMEN ACUTE W/ 1V CHEST COMPARISON:  06/25/2017 abdominal CT FINDINGS: Cardiomegaly accentuated by mediastinal fat. There is no edema, consolidation, effusion, or pneumothorax. Stable aortic tortuosity when compared to 05/15/2017. Coronary stenting. Normal bowel gas pattern. Moderate stool volume mainly in the right colon and possibly rectum. No evidence of pneumoperitoneum or pneumatosis. Lumbar fusion with advanced lower thoracic and thoracolumbar disc degeneration. IMPRESSION: 1. Normal bowel gas pattern. 2. Moderate stool volume. 3. No acute finding in the chest. Electronically Signed   By: Monte Fantasia M.D.   On: 08/16/2017 11:39    Procedures Procedures (including critical care time)  Medications Ordered in ED Medications  polyethylene glycol (MIRALAX / GLYCOLAX) packet 17 g (17 g Oral Given 08/16/17 1333)  amLODipine (NORVASC) tablet 10 mg (not administered)  isosorbide mononitrate (IMDUR) 24 hr tablet 30 mg (not administered)  ipratropium-albuterol (DUONEB) 0.5-2.5 (3) MG/3ML nebulizer solution 3 mL (not administered)  dicyclomine (BENTYL) capsule 10 mg (not administered)  0.9 %  sodium chloride infusion ( Intravenous New Bag/Given 08/16/17 1211)  morphine 4 MG/ML injection 4 mg (4 mg Intravenous Given 08/16/17 1210)  milk and molasses enema (250 mLs Rectal Given 08/16/17 1319)     Initial Impression / Assessment and Plan / ED Course  I have reviewed the triage vital signs and the nursing notes.  Pertinent labs & imaging results that were available during my care of the patient were reviewed by me and considered in my medical decision making (see chart for details).      Final Clinical Impressions(s) / ED Diagnoses   Final diagnoses:  Generalized abdominal pain  Constipation, unspecified  constipation type   Patient has problems with chronic and recurrent abdominal pain. Review of EMR shows that patient did have CT abdomen in August. There have been several visits for abdominal pain to the emergency department. At this time, based on physical examination, history and diagnostic results I have low suspicion for  acute surgical etiology. X-ray was obtained to make sure there is no obstruction. Currently I do not feel that repeat CT scan is indicated. I history this is most consistent with constipation. Patient describes urgency to have bowel movement but then difficulty passing stool. She has no fecal impaction in the small amount of stool in the vault is normal. Advises for more aggressive and consistent treatment for constipation. Prescriptions provided for Colace, MiraLAX and Bentyl. New Prescriptions New Prescriptions   DICYCLOMINE (BENTYL) 20 MG TABLET    Take 1 tablet (20 mg total) by mouth 3 (three) times daily as needed for spasms. Cramping abdominal pain.   DOCUSATE SODIUM (COLACE) 100 MG CAPSULE    Take 1 capsule (100 mg total) by mouth every 12 (twelve) hours.   POLYETHYLENE GLYCOL (MIRALAX / GLYCOLAX) PACKET    Take 17 g by mouth daily.     Charlesetta Shanks, MD 08/16/17 [phone]

## 2017-08-16 NOTE — ED Notes (Signed)
Per patient request, patient's daughter called for transport. Patient's daughter reports patient's sister Lelan Pons will pick her up.

## 2017-09-10 ENCOUNTER — Encounter: Payer: Self-pay | Admitting: Family Medicine

## 2017-09-16 ENCOUNTER — Encounter: Payer: Self-pay | Admitting: Gastroenterology

## 2017-10-05 ENCOUNTER — Emergency Department (HOSPITAL_COMMUNITY): Payer: Medicare Other

## 2017-10-05 ENCOUNTER — Inpatient Hospital Stay (HOSPITAL_COMMUNITY)
Admission: EM | Admit: 2017-10-05 | Discharge: 2017-10-12 | DRG: 190 | Disposition: A | Discharge status: Home Health | Payer: Medicare Other | Attending: Internal Medicine | Admitting: Internal Medicine

## 2017-10-05 ENCOUNTER — Inpatient Hospital Stay (HOSPITAL_COMMUNITY): Payer: Medicare Other

## 2017-10-05 ENCOUNTER — Other Ambulatory Visit: Payer: Self-pay

## 2017-10-05 ENCOUNTER — Encounter (HOSPITAL_COMMUNITY): Payer: Self-pay | Admitting: *Deleted

## 2017-10-05 DIAGNOSIS — J441 Chronic obstructive pulmonary disease with (acute) exacerbation: Secondary | ICD-10-CM | POA: Diagnosis present

## 2017-10-05 DIAGNOSIS — F05 Delirium due to known physiological condition: Secondary | ICD-10-CM | POA: Diagnosis present

## 2017-10-05 DIAGNOSIS — Z91041 Radiographic dye allergy status: Secondary | ICD-10-CM | POA: Diagnosis not present

## 2017-10-05 DIAGNOSIS — K6289 Other specified diseases of anus and rectum: Secondary | ICD-10-CM | POA: Insufficient documentation

## 2017-10-05 DIAGNOSIS — Z955 Presence of coronary angioplasty implant and graft: Secondary | ICD-10-CM

## 2017-10-05 DIAGNOSIS — Z87891 Personal history of nicotine dependence: Secondary | ICD-10-CM | POA: Diagnosis not present

## 2017-10-05 DIAGNOSIS — I248 Other forms of acute ischemic heart disease: Secondary | ICD-10-CM | POA: Diagnosis present

## 2017-10-05 DIAGNOSIS — R079 Chest pain, unspecified: Secondary | ICD-10-CM | POA: Diagnosis present

## 2017-10-05 DIAGNOSIS — I129 Hypertensive chronic kidney disease with stage 1 through stage 4 chronic kidney disease, or unspecified chronic kidney disease: Secondary | ICD-10-CM | POA: Diagnosis present

## 2017-10-05 DIAGNOSIS — Z888 Allergy status to other drugs, medicaments and biological substances status: Secondary | ICD-10-CM

## 2017-10-05 DIAGNOSIS — R109 Unspecified abdominal pain: Secondary | ICD-10-CM

## 2017-10-05 DIAGNOSIS — Z90710 Acquired absence of both cervix and uterus: Secondary | ICD-10-CM

## 2017-10-05 DIAGNOSIS — Z923 Personal history of irradiation: Secondary | ICD-10-CM | POA: Diagnosis not present

## 2017-10-05 DIAGNOSIS — R0602 Shortness of breath: Secondary | ICD-10-CM | POA: Diagnosis present

## 2017-10-05 DIAGNOSIS — E669 Obesity, unspecified: Secondary | ICD-10-CM | POA: Diagnosis present

## 2017-10-05 DIAGNOSIS — K219 Gastro-esophageal reflux disease without esophagitis: Secondary | ICD-10-CM | POA: Diagnosis present

## 2017-10-05 DIAGNOSIS — Z85828 Personal history of other malignant neoplasm of skin: Secondary | ICD-10-CM | POA: Diagnosis not present

## 2017-10-05 DIAGNOSIS — F419 Anxiety disorder, unspecified: Secondary | ICD-10-CM | POA: Diagnosis present

## 2017-10-05 DIAGNOSIS — J449 Chronic obstructive pulmonary disease, unspecified: Secondary | ICD-10-CM | POA: Diagnosis present

## 2017-10-05 DIAGNOSIS — Z9981 Dependence on supplemental oxygen: Secondary | ICD-10-CM

## 2017-10-05 DIAGNOSIS — J9621 Acute and chronic respiratory failure with hypoxia: Secondary | ICD-10-CM | POA: Diagnosis present

## 2017-10-05 DIAGNOSIS — I2510 Atherosclerotic heart disease of native coronary artery without angina pectoris: Secondary | ICD-10-CM | POA: Diagnosis present

## 2017-10-05 DIAGNOSIS — K5900 Constipation, unspecified: Secondary | ICD-10-CM | POA: Diagnosis present

## 2017-10-05 DIAGNOSIS — N183 Chronic kidney disease, stage 3 (moderate): Secondary | ICD-10-CM | POA: Diagnosis present

## 2017-10-05 DIAGNOSIS — I10 Essential (primary) hypertension: Secondary | ICD-10-CM | POA: Diagnosis present

## 2017-10-05 DIAGNOSIS — T380X5A Adverse effect of glucocorticoids and synthetic analogues, initial encounter: Secondary | ICD-10-CM | POA: Diagnosis present

## 2017-10-05 DIAGNOSIS — R1030 Lower abdominal pain, unspecified: Secondary | ICD-10-CM | POA: Diagnosis not present

## 2017-10-05 DIAGNOSIS — Z6834 Body mass index (BMI) 34.0-34.9, adult: Secondary | ICD-10-CM

## 2017-10-05 DIAGNOSIS — I25118 Atherosclerotic heart disease of native coronary artery with other forms of angina pectoris: Secondary | ICD-10-CM | POA: Diagnosis not present

## 2017-10-05 DIAGNOSIS — F32A Depression, unspecified: Secondary | ICD-10-CM | POA: Diagnosis present

## 2017-10-05 DIAGNOSIS — Z8249 Family history of ischemic heart disease and other diseases of the circulatory system: Secondary | ICD-10-CM

## 2017-10-05 DIAGNOSIS — F329 Major depressive disorder, single episode, unspecified: Secondary | ICD-10-CM | POA: Diagnosis present

## 2017-10-05 DIAGNOSIS — E785 Hyperlipidemia, unspecified: Secondary | ICD-10-CM | POA: Diagnosis present

## 2017-10-05 DIAGNOSIS — R52 Pain, unspecified: Secondary | ICD-10-CM

## 2017-10-05 DIAGNOSIS — R748 Abnormal levels of other serum enzymes: Secondary | ICD-10-CM | POA: Diagnosis not present

## 2017-10-05 LAB — COMPREHENSIVE METABOLIC PANEL
ALBUMIN: 3.9 g/dL (ref 3.5–5.0)
ALT: 11 U/L — ABNORMAL LOW (ref 14–54)
ANION GAP: 9 (ref 5–15)
AST: 19 U/L (ref 15–41)
Alkaline Phosphatase: 62 U/L (ref 38–126)
BUN: 13 mg/dL (ref 6–20)
CALCIUM: 9.6 mg/dL (ref 8.9–10.3)
CO2: 28 mmol/L (ref 22–32)
Chloride: 102 mmol/L (ref 101–111)
Creatinine, Ser: 0.96 mg/dL (ref 0.44–1.00)
GFR calc non Af Amer: 54 mL/min — ABNORMAL LOW (ref >60)
GLUCOSE: 96 mg/dL (ref 65–99)
POTASSIUM: 3.6 mmol/L (ref 3.5–5.1)
SODIUM: 139 mmol/L (ref 135–145)
TOTAL PROTEIN: 6.9 g/dL (ref 6.5–8.1)
Total Bilirubin: 0.5 mg/dL (ref 0.3–1.2)

## 2017-10-05 LAB — URINALYSIS, ROUTINE W REFLEX MICROSCOPIC
BILIRUBIN URINE: NEGATIVE
Glucose, UA: NEGATIVE mg/dL
HGB URINE DIPSTICK: NEGATIVE
KETONES UR: 5 mg/dL — AB
Leukocytes, UA: NEGATIVE
NITRITE: NEGATIVE
Protein, ur: NEGATIVE mg/dL
Specific Gravity, Urine: 1.016 (ref 1.005–1.030)
pH: 5 (ref 5.0–8.0)

## 2017-10-05 LAB — CBC WITH DIFFERENTIAL/PLATELET
BASOS ABS: 0 10*3/uL (ref 0.0–0.1)
Basophils Relative: 0 %
Eosinophils Absolute: 0.2 10*3/uL (ref 0.0–0.7)
Eosinophils Relative: 2 %
HCT: 44.2 % (ref 36.0–46.0)
HEMOGLOBIN: 14.2 g/dL (ref 12.0–15.0)
LYMPHS PCT: 24 %
Lymphs Abs: 2.2 10*3/uL (ref 0.7–4.0)
MCH: 31.1 pg (ref 26.0–34.0)
MCHC: 32.1 g/dL (ref 30.0–36.0)
MCV: 96.7 fL (ref 78.0–100.0)
MONO ABS: 0.8 10*3/uL (ref 0.1–1.0)
MONOS PCT: 8 %
NEUTROS ABS: 6 10*3/uL (ref 1.7–7.7)
NEUTROS PCT: 66 %
PLATELETS: 214 10*3/uL (ref 150–400)
RBC: 4.57 MIL/uL (ref 3.87–5.11)
RDW: 12.7 % (ref 11.5–15.5)
WBC: 9.1 10*3/uL (ref 4.0–10.5)

## 2017-10-05 LAB — I-STAT TROPONIN, ED: TROPONIN I, POC: 0.02 ng/mL (ref 0.00–0.08)

## 2017-10-05 LAB — LIPASE, BLOOD: LIPASE: 36 U/L (ref 11–51)

## 2017-10-05 MED ORDER — HYDROCORTISONE NA SUCCINATE PF 250 MG IJ SOLR
200.0000 mg | Freq: Once | INTRAMUSCULAR | Status: AC
  Administered 2017-10-06: 200 mg via INTRAVENOUS
  Filled 2017-10-05: qty 200

## 2017-10-05 MED ORDER — ZOLPIDEM TARTRATE 5 MG PO TABS
5.0000 mg | ORAL_TABLET | Freq: Every evening | ORAL | Status: DC | PRN
  Filled 2017-10-05: qty 1

## 2017-10-05 MED ORDER — ALPRAZOLAM 0.25 MG PO TABS
0.2500 mg | ORAL_TABLET | Freq: Two times a day (BID) | ORAL | Status: DC | PRN
  Administered 2017-10-07: 0.25 mg via ORAL
  Filled 2017-10-05: qty 1

## 2017-10-05 MED ORDER — IPRATROPIUM-ALBUTEROL 0.5-2.5 (3) MG/3ML IN SOLN
3.0000 mL | Freq: Once | RESPIRATORY_TRACT | Status: AC
  Administered 2017-10-05: 3 mL via RESPIRATORY_TRACT
  Filled 2017-10-05: qty 3

## 2017-10-05 MED ORDER — NITROGLYCERIN 0.4 MG SL SUBL: 0.4000 mg | SUBLINGUAL_TABLET | SUBLINGUAL | Status: DC | PRN

## 2017-10-05 MED ORDER — GUAIFENESIN ER 600 MG PO TB12
600.0000 mg | ORAL_TABLET | Freq: Two times a day (BID) | ORAL | Status: DC
  Administered 2017-10-06 – 2017-10-12 (×14): 600 mg via ORAL
  Filled 2017-10-05 (×14): qty 1

## 2017-10-05 MED ORDER — PSYLLIUM 95 % PO PACK
1.0000 | PACK | Freq: Two times a day (BID) | ORAL | Status: DC
  Administered 2017-10-06 – 2017-10-11 (×12): 1 via ORAL
  Filled 2017-10-05 (×14): qty 1

## 2017-10-05 MED ORDER — VITAMIN D 1000 UNITS PO TABS
1000.0000 [IU] | ORAL_TABLET | Freq: Every day | ORAL | Status: DC
  Administered 2017-10-06 – 2017-10-12 (×8): 1000 [IU] via ORAL
  Filled 2017-10-05 (×8): qty 1

## 2017-10-05 MED ORDER — METHYLPREDNISOLONE SODIUM SUCC 125 MG IJ SOLR
80.0000 mg | Freq: Once | INTRAMUSCULAR | Status: AC
  Administered 2017-10-05: 80 mg via INTRAVENOUS
  Filled 2017-10-05: qty 2

## 2017-10-05 MED ORDER — SODIUM CHLORIDE 0.9 % IV SOLN
50.0000 mg | Freq: Once | INTRAVENOUS | Status: AC
  Administered 2017-10-06: 50 mg via INTRAVENOUS
  Filled 2017-10-05: qty 1

## 2017-10-05 MED ORDER — FAMOTIDINE 20 MG PO TABS
40.0000 mg | ORAL_TABLET | Freq: Every day | ORAL | Status: DC
Start: 2017-10-05 — End: 2017-10-08
  Administered 2017-10-06 – 2017-10-07 (×3): 40 mg via ORAL
  Filled 2017-10-05 (×3): qty 2

## 2017-10-05 MED ORDER — AZITHROMYCIN 250 MG PO TABS
500.0000 mg | ORAL_TABLET | Freq: Every day | ORAL | Status: AC
  Administered 2017-10-06: 500 mg via ORAL
  Filled 2017-10-05: qty 2

## 2017-10-05 MED ORDER — ISOSORBIDE MONONITRATE ER 30 MG PO TB24
30.0000 mg | ORAL_TABLET | Freq: Every day | ORAL | Status: DC
  Administered 2017-10-06 – 2017-10-09 (×4): 30 mg via ORAL
  Filled 2017-10-05 (×4): qty 1

## 2017-10-05 MED ORDER — POLYETHYLENE GLYCOL 3350 17 G PO PACK
17.0000 g | PACK | Freq: Every day | ORAL | Status: DC
  Administered 2017-10-07 – 2017-10-11 (×5): 17 g via ORAL
  Filled 2017-10-05 (×6): qty 1

## 2017-10-05 MED ORDER — TRAMADOL HCL 50 MG PO TABS: 50.0000 mg | ORAL_TABLET | Freq: Four times a day (QID) | ORAL | Status: DC | PRN

## 2017-10-05 MED ORDER — BUDESONIDE 0.25 MG/2ML IN SUSP
0.2500 mg | Freq: Two times a day (BID) | RESPIRATORY_TRACT | Status: DC
  Administered 2017-10-06 – 2017-10-12 (×12): 0.25 mg via RESPIRATORY_TRACT
  Filled 2017-10-05 (×15): qty 2

## 2017-10-05 MED ORDER — TRAMADOL HCL 50 MG PO TABS
50.0000 mg | ORAL_TABLET | Freq: Once | ORAL | Status: AC
  Administered 2017-10-05: 50 mg via ORAL
  Filled 2017-10-05: qty 1

## 2017-10-05 MED ORDER — IPRATROPIUM-ALBUTEROL 0.5-2.5 (3) MG/3ML IN SOLN
3.0000 mL | RESPIRATORY_TRACT | Status: DC
  Administered 2017-10-05 – 2017-10-06 (×2): 3 mL via RESPIRATORY_TRACT
  Filled 2017-10-05 (×2): qty 3

## 2017-10-05 MED ORDER — ENOXAPARIN SODIUM 40 MG/0.4ML ~~LOC~~ SOLN
40.0000 mg | SUBCUTANEOUS | Status: DC
  Administered 2017-10-06 – 2017-10-12 (×7): 40 mg via SUBCUTANEOUS
  Filled 2017-10-05 (×8): qty 0.4

## 2017-10-05 MED ORDER — ACETAMINOPHEN 325 MG PO TABS: 650.0000 mg | ORAL_TABLET | Freq: Four times a day (QID) | ORAL | Status: DC | PRN

## 2017-10-05 MED ORDER — SODIUM CHLORIDE 0.9 % IV SOLN: INTRAVENOUS | Status: DC

## 2017-10-05 MED ORDER — POLYETHYLENE GLYCOL 3350 17 G PO PACK: 17.0000 g | PACK | Freq: Every day | ORAL | Status: DC

## 2017-10-05 MED ORDER — FENTANYL CITRATE (PF) 100 MCG/2ML IJ SOLN
25.0000 ug | Freq: Once | INTRAMUSCULAR | Status: AC
  Administered 2017-10-05: 25 ug via INTRAVENOUS
  Filled 2017-10-05: qty 2

## 2017-10-05 MED ORDER — MORPHINE SULFATE (PF) 4 MG/ML IV SOLN
1.0000 mg | INTRAVENOUS | Status: DC | PRN
  Administered 2017-10-06 – 2017-10-10 (×3): 1 mg via INTRAVENOUS
  Filled 2017-10-05 (×3): qty 1

## 2017-10-05 MED ORDER — DOCUSATE SODIUM 100 MG PO CAPS
100.0000 mg | ORAL_CAPSULE | Freq: Two times a day (BID) | ORAL | Status: DC
  Administered 2017-10-06 – 2017-10-12 (×13): 100 mg via ORAL
  Filled 2017-10-05 (×14): qty 1

## 2017-10-05 MED ORDER — NORTRIPTYLINE HCL 25 MG PO CAPS: 25.0000 mg | ORAL_CAPSULE | Freq: Two times a day (BID) | ORAL | Status: DC

## 2017-10-05 MED ORDER — ONDANSETRON HCL 4 MG/2ML IJ SOLN
4.0000 mg | Freq: Three times a day (TID) | INTRAMUSCULAR | Status: DC | PRN
  Administered 2017-10-07 – 2017-10-10 (×4): 4 mg via INTRAVENOUS
  Filled 2017-10-05 (×5): qty 2

## 2017-10-05 MED ORDER — ONDANSETRON HCL 4 MG/2ML IJ SOLN
4.0000 mg | Freq: Once | INTRAMUSCULAR | Status: AC
  Administered 2017-10-05: 4 mg via INTRAVENOUS
  Filled 2017-10-05: qty 2

## 2017-10-05 MED ORDER — DICYCLOMINE HCL 20 MG PO TABS
20.0000 mg | ORAL_TABLET | Freq: Three times a day (TID) | ORAL | Status: DC | PRN
  Administered 2017-10-12: 20 mg via ORAL
  Filled 2017-10-05: qty 1

## 2017-10-05 MED ORDER — ASPIRIN 325 MG PO TABS
325.0000 mg | ORAL_TABLET | Freq: Every day | ORAL | Status: DC
  Administered 2017-10-06 – 2017-10-12 (×8): 325 mg via ORAL
  Filled 2017-10-05 (×8): qty 1

## 2017-10-05 MED ORDER — AZITHROMYCIN 250 MG PO TABS
250.0000 mg | ORAL_TABLET | Freq: Every day | ORAL | Status: AC
  Administered 2017-10-06 – 2017-10-09 (×4): 250 mg via ORAL
  Filled 2017-10-05 (×4): qty 1

## 2017-10-05 MED ORDER — AMLODIPINE BESYLATE 5 MG PO TABS
6.2500 mg | ORAL_TABLET | Freq: Every day | ORAL | Status: DC
  Filled 2017-10-05: qty 0.5

## 2017-10-05 MED ORDER — METHYLPREDNISOLONE SODIUM SUCC 125 MG IJ SOLR: 60.0000 mg | Freq: Three times a day (TID) | INTRAMUSCULAR | Status: DC

## 2017-10-05 MED ORDER — HYDRALAZINE HCL 20 MG/ML IJ SOLN
5.0000 mg | INTRAMUSCULAR | Status: DC | PRN
  Administered 2017-10-07 – 2017-10-12 (×2): 5 mg via INTRAVENOUS
  Filled 2017-10-05 (×2): qty 1

## 2017-10-05 NOTE — ED Triage Notes (Signed)
Pt here via [facility] for all over body aches, but specifically pain above her rectum.  States last bm 2 days ago.  PT was taking chronic pain meds, but prescriptions are no longer being filled.

## 2017-10-05 NOTE — ED Notes (Signed)
Patient transported to X-ray 

## 2017-10-05 NOTE — ED Notes (Signed)
Pt requesting to see her doctor, called [facility], [place]

## 2017-10-05 NOTE — H&P (Signed)
History and Physical    [name] XNA:[id number] DOB: 1934/12/31 DOA: 10/05/2017  Referring MD/NP/PA:   PCP: Leonard Downing, MD   [name] coming from:  The [name] is coming from [name].  At baseline, pt is independent for most of ADL.   Chief Complaint: Shortness of breath, abdominal pain, chest pain  HPI: [name] is a 82 y.o. female with medical history significant of hypertension, hyperlipidemia, COPD, GERD, hearing loss, CAD, stent placement, depression, who presents with shortness breath, cough, abdominal pain chest pain.  [name] is a very poor historian. Though she is alert and oriented 3, she cannot focus to answer any questions clearly. The history is limited. [name] states that she has been having shortness of breath in the past several days. She has wheezing and dry cough. She also reports intermittent mild chest pain in the substernal area, but cannot characterize it further. Denies tenderness in the calf areas. She states that she has abdominal pain and whole body aches in the past 2 days. She states that her abdominal pain is located in the lower abdomen and also in the rectal area. Denies rectal bleeding. She has nausea, no vomiting, diarrhea. Denies fever or chills. She denies constipation. [name] does not have symptoms of UTI or unilateral weakness.  ED Course: pt was found to have WBC 9.1, negative troponin, lipase 36, negative urinalysis, electrolytes renal function okay, temperature normal, heart rate in 90s, tachypnea, oxygen desaturated to 79%, ABG with pH of 7.38, PCO2 48.4, PCO2 59. Negative chest x-ray for acute abnormalities. [name] is admitted to stepdown as inpatient BiPAP is started.  Review of Systems:   General: no fevers, chills, no body weight gain, has poor appetite, has fatigue HEENT: no blurry vision, hearing changes or sore throat Respiratory: has dyspnea, coughing, wheezing CV: has chest pain, no palpitations GI: has nausea and  abdominal pain, no vomiting,diarrhea, constipation GU: no dysuria, burning on urination, increased urinary frequency, hematuria  Ext: no leg edema Neuro: no unilateral weakness, numbness, or tingling, no vision change or hearing loss Skin: no rash, no skin tear. MSK: No muscle spasm, no deformity, no limitation of range of movement in spin Heme: No easy bruising.  Travel history: No recent long distant travel.  Allergy:  Allergies  Allergen Reactions  . Iohexol Other (See Comments)    Code:  HIVES, Desc:  PER ROBIN @ GI, PT IS ALLERGIC TO IVP DYE 08/28/10 RM  . Lipitor [Atorvastatin Calcium] Hives    Past Medical History:  Diagnosis Date  . Arthritis    "aching bones sometimes"  . Blurred vision   . CAD (coronary artery disease)    Multiple percutaneous revascularization. Catheterization May 2013 left main normal, patent LAD stent, 90% circumflex stenosis, patent right coronary artery stents. [name] had a DES to the circumflex. The EF was well-preserved.  Marland Kitchen COPD (chronic obstructive pulmonary disease) ([place])   . Fatigue   . GERD (gastroesophageal reflux disease)   . Hard of hearing   . HTN (hypertension)   . Hx of radiation therapy    right nose 4500 cGy in 10 sessions over 5 weeks (2 treatments a week)  . Hyperlipemia   . Squamous cell carcinoma of nose    right    Past Surgical History:  Procedure Laterality Date  . ABDOMINAL HYSTERECTOMY    . CORONARY ANGIOPLASTY WITH STENT PLACEMENT  2002-2009   x 5  . CORONARY ANGIOPLASTY WITH STENT PLACEMENT  05/06/12   "1; this makes  5"  . ESOPHAGEAL DILATION  ~ 2012   Dr. Deatra Ina  . LUMBAR SPINE SURGERY  04/2011   Rods, screws and cages  . PERCUTANEOUS CORONARY STENT INTERVENTION (PCI-S) N/A 05/06/2012   Performed by Burnell Blanks, MD at [facility] CATH LAB  . SKIN BIOPSY  10/15/13   right nose-microinvasive squamous cell carcinoma    Social History:  reports that she has quit smoking. Her smoking use included cigarettes.  She has a 27.50 pack-year smoking history. She has quit using smokeless tobacco. Her smokeless tobacco use included snuff. She reports that she does not drink alcohol or use drugs.  Family History:  Family History  Problem Relation Age of Onset  . Glaucoma Father   . Heart attack Father   . Bone cancer Mother      Prior to Admission medications   Medication Sig Start Date End Date Taking? Authorizing Provider  albuterol (PROVENTIL HFA;VENTOLIN HFA) 108 (90 BASE) MCG/ACT inhaler Inhale 2 puffs into the lungs every 4 (four) hours as needed for wheezing. 12/01/14  Yes Delfina Redwood, MD  amLODipine (NORVASC) 2.5 MG tablet Take 2.5 tablets daily by mouth. 08/21/17  Yes [provider]  cholecalciferol (VITAMIN D) 1000 UNITS tablet Take 1,000 Units by mouth daily.    Yes [provider]  dicyclomine (BENTYL) 20 MG tablet Take 1 tablet (20 mg total) by mouth 3 (three) times daily as needed for spasms. Cramping abdominal pain. 08/16/17  Yes Charlesetta Shanks, MD  docusate sodium (COLACE) 100 MG capsule Take 1 capsule (100 mg total) by mouth every 12 (twelve) hours. 08/16/17  Yes Charlesetta Shanks, MD  guaiFENesin (MUCINEX) 600 MG 12 hr tablet Take 1 tablet (600 mg total) by mouth 2 (two) times daily. 02/03/13  Yes Kathie Dike, MD  isosorbide mononitrate (IMDUR) 30 MG 24 hr tablet Take 1 tablet (30 mg total) by mouth daily. 12/30/16  Yes Minus Breeding, MD  psyllium (METAMUCIL SMOOTH TEXTURE) 28 % packet Take 1 packet by mouth 2 (two) times daily.   Yes [provider]  ranitidine (ZANTAC) 300 MG tablet Take 300 mg by mouth at bedtime.    Yes [provider]  arformoterol (BROVANA) 15 MCG/2ML NEBU Take 2 mLs (15 mcg total) by nebulization 2 (two) times daily. [name] not taking: Reported on 10/05/2017 02/03/17   Mannam, Hart Robinsons, MD  budesonide (PULMICORT) 0.25 MG/2ML nebulizer solution Take 2 mLs (0.25 mg total) by nebulization 2 (two) times daily. 02/03/17 02/03/18   Mannam, Hart Robinsons, MD  nitroGLYCERIN (NITROSTAT) 0.4 MG SL tablet Place 0.4 mg under the tongue every 5 (five) minutes as needed for chest pain.    [provider]  nortriptyline (PAMELOR) 25 MG capsule Take 25 mg by mouth 2 (two) times daily.    [provider]  polyethylene glycol (MIRALAX / GLYCOLAX) packet Take 17 g by mouth daily. 08/16/17   Charlesetta Shanks, MD  polyethylene glycol [facility]) packet Take 17 g by mouth daily. [name] not taking: Reported on 10/05/2017 01/29/17   Davonna Belling, MD  traMADol (ULTRAM) 50 MG tablet Take 1 tablet (50 mg total) by mouth every 6 (six) hours as needed. [name] not taking: Reported on 10/05/2017 12/26/16   Tanna Furry, MD    Physical Exam: Vitals:   10/05/17 2000 10/05/17 2100 10/05/17 2300 10/05/17 2352  BP: (!) 175/92 (!) 187/83 (!) 175/81   Pulse: 98 93 97 98  Resp: (!) 22 (!) 21 (!) 24 20  Temp:      TempSrc:  SpO2: 95% 96% (!) 87% 90%  Weight:      Height:       General: Not in acute distress. Pt is tremulous HEENT:       Eyes: PERRL, EOMI, no scleral icterus.       ENT: No discharge from the ears and nose, no pharynx injection, no tonsillar enlargement.        Neck: No JVD, no bruit, no mass felt. Heme: No neck lymph node enlargement. Cardiac: S1/S2, RRR, No murmurs, No gallops or rubs. Respiratory: Has wheezing bilaterally.  GI: Soft, nondistended, has tenderness in lower abdomen, no rebound pain, no organomegaly, BS present. GU: No hematuria Ext: No pitting leg edema bilaterally. 2+DP/PT pulse bilaterally. Musculoskeletal: No joint deformities, No joint redness or warmth, no limitation of ROM in spin. Skin: No rashes.  Neuro: Alert, oriented X3, cranial nerves II-XII grossly intact, moves all extremities normally. Psych: [name] is not psychotic, no suicidal or hemocidal ideation.  Labs on Admission: I have personally reviewed following labs and imaging studies  CBC: Recent Labs  Lab 10/05/17 1600    WBC 9.1  NEUTROABS 6.0  HGB 14.2  HCT 44.2  MCV 96.7  PLT 097   Basic Metabolic Panel: Recent Labs  Lab 10/05/17 1600  NA 139  K 3.6  CL 102  CO2 28  GLUCOSE 96  BUN 13  CREATININE 0.96  CALCIUM 9.6   GFR: Estimated Creatinine Clearance: 50.7 mL/min (by C-G formula based on SCr of 0.96 mg/dL). Liver Function Tests: Recent Labs  Lab 10/05/17 1600  AST 19  ALT 11*  ALKPHOS 62  BILITOT 0.5  PROT 6.9  ALBUMIN 3.9   Recent Labs  Lab 10/05/17 1600  LIPASE 36   No results for input(s): AMMONIA in the last 168 hours. Coagulation Profile: No results for input(s): INR, PROTIME in the last 168 hours. Cardiac Enzymes: No results for input(s): CKTOTAL, CKMB, CKMBINDEX, TROPONINI in the last 168 hours. BNP (last 3 results) No results for input(s): PROBNP in the last 8760 hours. HbA1C: No results for input(s): HGBA1C in the last 72 hours. CBG: No results for input(s): GLUCAP in the last 168 hours. Lipid Profile: No results for input(s): CHOL, HDL, LDLCALC, TRIG, CHOLHDL, LDLDIRECT in the last 72 hours. Thyroid Function Tests: No results for input(s): TSH, T4TOTAL, FREET4, T3FREE, THYROIDAB in the last 72 hours. Anemia Panel: No results for input(s): VITAMINB12, FOLATE, FERRITIN, TIBC, IRON, RETICCTPCT in the last 72 hours. Urine analysis:    Component Value Date/Time   COLORURINE YELLOW 10/05/2017 1932   APPEARANCEUR HAZY (A) 10/05/2017 1932   LABSPEC 1.016 10/05/2017 1932   PHURINE 5.0 10/05/2017 1932   GLUCOSEU NEGATIVE 10/05/2017 [place] NEGATIVE 10/05/2017 [place] 10/05/2017 1932   KETONESUR 5 (A) 10/05/2017 1932   PROTEINUR NEGATIVE 10/05/2017 1932   UROBILINOGEN 0.2 11/28/2014 1557   NITRITE NEGATIVE 10/05/2017 1932   LEUKOCYTESUR NEGATIVE 10/05/2017 1932   Sepsis Labs: @LABRCNTIP (procalcitonin:4,lacticidven:4) )No results found for this or any previous visit (from the past 240 hour(s)).   Radiological Exams on  Admission: Dg Chest 2 View  Result Date: 10/05/2017 CLINICAL DATA:  Generalized body aches, particularly in the pelvis. Last bowel movement 2 days ago. Chest pain. History of coronary artery disease, COPD, hypertension. EXAM: CHEST  2 VIEW COMPARISON:  Chest x-rays dated 08/16/2017 and 10/15/2016. FINDINGS: Mild cardiomegaly is stable. Atherosclerotic changes again noted at the aortic arch. Lungs are clear. No pleural effusion or pneumothorax seen. Mild  degenerative spurring within the thoracic spine. No acute or suspicious osseous finding. IMPRESSION: No active cardiopulmonary disease. No evidence of pneumonia or pulmonary edema. Stable cardiomegaly. Aortic atherosclerosis. Electronically Signed   By: Franki Cabot M.D.   On: 10/05/2017 16:18     EKG: Independently reviewed. Sinus rhythm, QTC 481, low voltage, nonspecific T-wave change.   Assessment/Plan Principal Problem:   COPD exacerbation (HCC) Active Problems:   Coronary atherosclerosis   HTN (hypertension)   Abdominal pain   Chest pain   Depression   Acute on chronic respiratory failure with hypoxia (HCC)  Acute on chronic respiratory failure with hypoxia due to COPD exacerbation [facility]): [name] is a very poor historian, but she has cough, wheezing and negative chest x-ray, consistent with COPD exacerbation.  -will admit [name] to SDU as inpt - on BiPAP -Nebulizers: scheduled Duoneb and prn albuterol - continue pulmiocor -Solu-Medrol 60 mg IV tid  -Oral azithromycin for 5 days.  -Mucinex for cough  -Incentive spirometry -Urine S. pneumococcal antigen -Follow up blood culture x2, sputum culture, respiratory virus panel, Flu pcr  Hx of CAD and chest pain: She reports intermittent mild chest pain in the substernal area, but cannot characterize it further. No signs of DVT. CXR negative - cycle CE q6 x3 and repeat EKG in the am  - prn Nitroglycerin, Morphine, and aspirin - on Imdur - Risk factor stratification: will check FLP  and A1C  - 2d echo  Abdominal pain: Etiology is not clear. Lipase is 36. -CT abdomen/pelvis with contrast (due to dye allergy, [name] will be given Solu Cortef 200 mg and Benadryl CT scan) -When necessary morphine for pain and the Zofran for nausea  HTN:  -Continue [name] medications: Amlodipine -IV hydralazine prn  Depression: Stable, no suicidal or homicidal ideations. -Continue [name] medications: Nortriptyline   DVT ppx:  SQ Lovenox Code Status: Full code Family Communication: None at bed side.     Disposition Plan:  Anticipate discharge back to previous [name] environment Consults called:  none Admission status:  Inpatient/tele    Date of Service 10/05/2017    Ivor Costa [facility] Pager [phone]  If 7PM-7AM, please contact night-coverage [email or web address] Password [email or web address] 10/05/2017, 11:59 PM

## 2017-10-05 NOTE — ED Provider Notes (Signed)
3:56 PM. BP (!) 183/76   Pulse 68   Temp 97.9 F (36.6 C) (Oral)   Resp (!) 21   Ht 5\' 4"  (1.626 m)   Wt 89.8 kg (198 lb)   SpO2 96%   BMI 33.99 kg/m   Here "wanting a pain pill." She has generalized chronic pain.   Poor historian. Patient is out of her pain meds because "they won't give it to me anymore." Patient got 150 tramadol on 06/20/2017,  on 02 at baseline for copd. Patient hurts everywhere to touch.  Patient will have basic labs and acs work up .  Dose of her tramadol here.   Reevaluation of patient Patient is in no he is an extremely poor historian. She states that she came in because of her pain.  She complains of pain in her hip and rectal pain.  Says that the pain is sharp and intermittent and sometimes takes her breath.  Her rectal exam which was done earlier showed no evidence of fecal impaction, fissures or bleeding.  Patient lives with her sister who cannot read or write. She has worsening work of breathing and is more markedly dyspneic since her arrival.  She is wheezing.  She says she does not have any breathing treatments at home.  Patient will be admitted by Dr. Blaine Hamper. He asks for ultrasound of the abdomen and hip x-rays.  I have reordered a DuoNeb for the patient.   Margarita Mail, PA-C 10/06/17 0932    Charlesetta Shanks, MD 10/07/17 1059

## 2017-10-05 NOTE — ED Provider Notes (Signed)
[facility] EMERGENCY DEPARTMENT Provider Note   CSN: [id number] Arrival date & time: 10/05/17  1041     History   Chief Complaint Chief Complaint  Patient presents with  . Generalized Body Aches  . Rectal Pain    HPI [name] is a 82 y.o. female.  HPI   [name] is a 82 y.o. female, with a history of arthritis, CAD, COPD, GERD, HTN, presenting to the ED with generalized pain for several months.  Her descriptions of the pain are rather vague but they include her chest, back, abdomen, and extremities.  Her initial statement as soon as I entered the room was, "Can I have a pain pill? My regular doctor doesn't give them to me anymore."  Also complains of constipation. Last BM 2 days ago.  Patient is on 2 L supplemental O2 on a regular basis. Denies fever/chills, N/V/D, neuro deficits, headache, dizziness, or any other complaints.    Past Medical History:  Diagnosis Date  . Arthritis    "aching bones sometimes"  . Blurred vision   . CAD (coronary artery disease)    Multiple percutaneous revascularization. Catheterization May 2013 left main normal, patent LAD stent, 90% circumflex stenosis, patent right coronary artery stents. Patient had a DES to the circumflex. The EF was well-preserved.  Marland Kitchen COPD (chronic obstructive pulmonary disease) ([place])   . Fatigue   . GERD (gastroesophageal reflux disease)   . Hard of hearing   . HTN (hypertension)   . Hx of radiation therapy    right nose 4500 cGy in 10 sessions over 5 weeks (2 treatments a week)  . Hyperlipemia   . Squamous cell carcinoma of nose    right    Patient Active Problem List   Diagnosis Date Noted  . AP (abdominal pain)   . Atypical chest pain 11/28/2014  . Chest pain 11/28/2014  . Tobacco abuse 11/28/2014  . Skin cancer of nose   . Squamous cell carcinoma of skin of other and unspecified parts of face 10/15/2013  . COPD, group B, by GOLD 2017 classification ([place]) 01/30/2013  .  Abdominal pain 01/30/2013  . Constipation 01/30/2013  . Hypokalemia 01/30/2013  . Leukocytosis 05/14/2012  . HTN (hypertension) 05/14/2012  . Progressive angina ([place]) 05/07/2012  . Hyperlipemia   . Dysphagia, unspecified(787.20) 02/04/2012  . Esophageal reflux 02/04/2012  . Coronary atherosclerosis 08/28/2010  . ANAL OR RECTAL PAIN 08/28/2010  . NAUSEA ALONE 08/28/2010    Past Surgical History:  Procedure Laterality Date  . ABDOMINAL HYSTERECTOMY    . CORONARY ANGIOPLASTY WITH STENT PLACEMENT  2002-2009   x 5  . CORONARY ANGIOPLASTY WITH STENT PLACEMENT  05/06/12   "1; this makes 5"  . ESOPHAGEAL DILATION  ~ 2012   Dr. Deatra Ina  . LUMBAR SPINE SURGERY  04/2011   Rods, screws and cages  . PERCUTANEOUS CORONARY STENT INTERVENTION (PCI-S) N/A 05/06/2012   Performed by Burnell Blanks, MD at [facility] CATH LAB  . SKIN BIOPSY  10/15/13   right nose-microinvasive squamous cell carcinoma    OB History    No data available       Home Medications    Prior to Admission medications   Medication Sig Start Date End Date Taking? Authorizing Provider  albuterol (PROVENTIL HFA;VENTOLIN HFA) 108 (90 BASE) MCG/ACT inhaler Inhale 2 puffs into the lungs every 4 (four) hours as needed for wheezing. 12/01/14  Yes Delfina Redwood, MD  amLODipine (NORVASC) 2.5 MG tablet Take  2.5 tablets daily by mouth. 08/21/17  Yes [provider]  cholecalciferol (VITAMIN D) 1000 UNITS tablet Take 1,000 Units by mouth daily.    Yes [provider]  dicyclomine (BENTYL) 20 MG tablet Take 1 tablet (20 mg total) by mouth 3 (three) times daily as needed for spasms. Cramping abdominal pain. 08/16/17  Yes Charlesetta Shanks, MD  docusate sodium (COLACE) 100 MG capsule Take 1 capsule (100 mg total) by mouth every 12 (twelve) hours. 08/16/17  Yes Charlesetta Shanks, MD  guaiFENesin (MUCINEX) 600 MG 12 hr tablet Take 1 tablet (600 mg total) by mouth 2 (two) times daily. 02/03/13  Yes Kathie Dike, MD    isosorbide mononitrate (IMDUR) 30 MG 24 hr tablet Take 1 tablet (30 mg total) by mouth daily. 12/30/16  Yes Minus Breeding, MD  psyllium (METAMUCIL SMOOTH TEXTURE) 28 % packet Take 1 packet by mouth 2 (two) times daily.   Yes [provider]  ranitidine (ZANTAC) 300 MG tablet Take 300 mg by mouth at bedtime.    Yes [provider]  arformoterol (BROVANA) 15 MCG/2ML NEBU Take 2 mLs (15 mcg total) by nebulization 2 (two) times daily. Patient not taking: Reported on 10/05/2017 02/03/17   Mannam, Hart Robinsons, MD  budesonide (PULMICORT) 0.25 MG/2ML nebulizer solution Take 2 mLs (0.25 mg total) by nebulization 2 (two) times daily. 02/03/17 02/03/18  Mannam, Hart Robinsons, MD  nitroGLYCERIN (NITROSTAT) 0.4 MG SL tablet Place 0.4 mg under the tongue every 5 (five) minutes as needed for chest pain.    [provider]  nortriptyline (PAMELOR) 25 MG capsule Take 25 mg by mouth 2 (two) times daily.    [provider]  polyethylene glycol (MIRALAX / GLYCOLAX) packet Take 17 g by mouth daily. 08/16/17   Charlesetta Shanks, MD  polyethylene glycol [facility]) packet Take 17 g by mouth daily. Patient not taking: Reported on 10/05/2017 01/29/17   Davonna Belling, MD  traMADol (ULTRAM) 50 MG tablet Take 1 tablet (50 mg total) by mouth every 6 (six) hours as needed. Patient not taking: Reported on 10/05/2017 12/26/16   Tanna Furry, MD    Family History Family History  Problem Relation Age of Onset  . Glaucoma Father   . Heart attack Father   . Bone cancer Mother     Social History Social History   Tobacco Use  . Smoking status: Former Smoker    Packs/day: 0.50    Years: 55.00    Pack years: 27.50    Types: Cigarettes  . Smokeless tobacco: Former Systems developer    Types: Snuff  . Tobacco comment: Quit earlier this year.   Substance Use Topics  . Alcohol use: No    Alcohol/week: 0.0 oz  . Drug use: No     Allergies   Iohexol and Lipitor [atorvastatin calcium]   Review of  Systems Review of Systems  Constitutional: Negative for chills, diaphoresis and fever.  Respiratory: Negative for cough and shortness of breath.   Cardiovascular: Positive for chest pain.  Gastrointestinal: Negative for diarrhea, nausea and vomiting.  Musculoskeletal: Positive for back pain.  All other systems reviewed and are negative.    Physical Exam Updated Vital Signs BP (!) 180/166 (BP Location: Left Arm) Comment: PT moving constantly  Pulse 84   Temp 97.9 F (36.6 C) (Oral)   Resp 20   Ht 5\' 4"  (1.626 m)   Wt 89.8 kg (198 lb)   SpO2 96%   BMI 33.99 kg/m   Physical Exam  Constitutional: She appears  well-developed and well-nourished. No distress.  HENT:  Head: Normocephalic and atraumatic.  Eyes: Conjunctivae are normal.  Neck: Neck supple.  Cardiovascular: Normal rate, regular rhythm, normal heart sounds and intact distal pulses.  Pulmonary/Chest: She has wheezes (global). She exhibits tenderness.  Abdominal: Soft. There is tenderness. There is no guarding.  Genitourinary:  Genitourinary Comments: No external hemorrhoids, fissures, or lesions noted. No gross blood or stool burden. No rectal tenderness. RN, Environmental education officer, served as Producer, television/film/video during the rectal exam.  Musculoskeletal: She exhibits tenderness. She exhibits no edema.  Patient seems to have global tenderness.  She indicates pain with palpation of her chest, abdomen, back, and extremities.  Lymphadenopathy:    She has no cervical adenopathy.  Neurological: She is alert.  Skin: Skin is warm and dry. Capillary refill takes less than 2 seconds. She is not diaphoretic.  Psychiatric: She has a normal mood and affect. Her behavior is normal.  Nursing note and vitals reviewed.    ED Treatments / Results  Labs (all labs ordered are listed, but only abnormal results are displayed) Labs Reviewed  COMPREHENSIVE METABOLIC PANEL - Abnormal; Notable for the following components:      Result Value   ALT 11 (*)    GFR  calc non Af Amer 54 (*)    All other components within normal limits  CBC WITH DIFFERENTIAL/PLATELET  LIPASE, BLOOD  URINALYSIS, ROUTINE W REFLEX MICROSCOPIC  I-STAT TROPONIN, ED    EKG  EKG Interpretation  Date/Time:  Sunday October 05 2017 15:39:31 EST Ventricular Rate:  68 PR Interval:    QRS Duration: 108 QT Interval:  452 QTC Calculation: 481 R Axis:   106 Text Interpretation:  Sinus rhythm Short PR interval Probable right ventricular hypertrophy no change from previous Confirmed by Charlesetta Shanks [phone]) on 10/05/2017 3:44:39 PM Also confirmed by Charlesetta Shanks [phone]), editor Laurena Spies [phone])  on 10/05/2017 3:50:13 PM       Radiology Dg Chest 2 View  Result Date: 10/05/2017 CLINICAL DATA:  Generalized body aches, particularly in the pelvis. Last bowel movement 2 days ago. Chest pain. History of coronary artery disease, COPD, hypertension. EXAM: CHEST  2 VIEW COMPARISON:  Chest x-rays dated 08/16/2017 and 10/15/2016. FINDINGS: Mild cardiomegaly is stable. Atherosclerotic changes again noted at the aortic arch. Lungs are clear. No pleural effusion or pneumothorax seen. Mild degenerative spurring within the thoracic spine. No acute or suspicious osseous finding. IMPRESSION: No active cardiopulmonary disease. No evidence of pneumonia or pulmonary edema. Stable cardiomegaly. Aortic atherosclerosis. Electronically Signed   By: Franki Cabot M.D.   On: 10/05/2017 16:18    Procedures Procedures (including critical care time)  Medications Ordered in ED Medications  traMADol (ULTRAM) tablet 50 mg (50 mg Oral Given 10/05/17 1523)  ipratropium-albuterol (DUONEB) 0.5-2.5 (3) MG/3ML nebulizer solution 3 mL (3 mLs Nebulization Given 10/05/17 1646)  methylPREDNISolone sodium succinate (SOLU-MEDROL) 125 mg/2 mL injection 80 mg (80 mg Intravenous Given 10/05/17 1652)     Initial Impression / Assessment and Plan / ED Course  I have reviewed the triage vital signs and the  nursing notes.  Pertinent labs & imaging results that were available during my care of the patient were reviewed by me and considered in my medical decision making (see chart for details).  Clinical Course as of Oct 05 1718  Nancy Fetter Oct 05, 2017  1629 Patient now states she does feel short of breath. Breathing treatment ordered.   [SJ]    Clinical Course User Index [SJ] Arlean Hopping  C, PA-C    Patient presents with generalized pain.  Resulted labs encouraging.  No acute abnormality on chest x-ray.  End of shift patient care handoff report given to Margarita Mail, PA-C. Plan: Reevaluate patient and her pain.  Reevaluate patient's breathing.  Expect patient to be discharged with PCP follow-up.  Findings and plan of care discussed with Sherwood Gambler, MD. Dr. Regenia Skeeter personally evaluated and examined this patient.  Vitals:   10/05/17 1532 10/05/17 1545 10/05/17 1630 10/05/17 1645  BP:      Pulse: 68 68 82 69  Resp: (!) 21 (!) 22 (!) 26 18  Temp:      TempSrc:      SpO2: 96% 99% 96% 100%  Weight:      Height:         Final Clinical Impressions(s) / ED Diagnoses   Final diagnoses:  None    ED Discharge Orders    None       Layla Maw 10/05/17 1721    Sherwood Gambler, MD 10/05/17 1756

## 2017-10-06 ENCOUNTER — Other Ambulatory Visit: Payer: Self-pay

## 2017-10-06 ENCOUNTER — Inpatient Hospital Stay (HOSPITAL_COMMUNITY): Payer: Medicare Other

## 2017-10-06 DIAGNOSIS — J449 Chronic obstructive pulmonary disease, unspecified: Secondary | ICD-10-CM | POA: Diagnosis present

## 2017-10-06 LAB — STREP PNEUMONIAE URINARY ANTIGEN: Strep Pneumo Urinary Antigen: NEGATIVE

## 2017-10-06 LAB — TROPONIN I
Troponin I: <0.03 ng/mL (ref <0.03)
Troponin I: 0.05 ng/mL (ref <0.03)
Troponin I: 0.08 ng/mL (ref <0.03)

## 2017-10-06 LAB — LIPID PANEL
Cholesterol: 194 mg/dL (ref 0–200)
HDL: 64 mg/dL (ref >40)
LDL CALC: 122 mg/dL — AB (ref 0–99)
TRIGLYCERIDES: 38 mg/dL (ref <150)
Total CHOL/HDL Ratio: 3 RATIO
VLDL: 8 mg/dL (ref 0–40)

## 2017-10-06 LAB — I-STAT ARTERIAL BLOOD GAS, ED
Acid-Base Excess: 3 mmol/L — ABNORMAL HIGH (ref 0.0–2.0)
Bicarbonate: 28.8 mmol/L — ABNORMAL HIGH (ref 20.0–28.0)
O2 Saturation: 90 %
PH ART: 7.38 (ref 7.350–7.450)
TCO2: 30 mmol/L (ref 22–32)
pCO2 arterial: 48.4 mmHg — ABNORMAL HIGH (ref 32.0–48.0)
pO2, Arterial: 59 mmHg — ABNORMAL LOW (ref 83.0–108.0)

## 2017-10-06 LAB — INFLUENZA PANEL BY PCR (TYPE A & B)
Influenza A By PCR: NEGATIVE
Influenza B By PCR: NEGATIVE

## 2017-10-06 LAB — HEMOGLOBIN A1C
Hgb A1c MFr Bld: 6.5 % — ABNORMAL HIGH (ref 4.8–5.6)
Mean Plasma Glucose: 139.85 mg/dL

## 2017-10-06 LAB — HIV ANTIBODY (ROUTINE TESTING W REFLEX): HIV Screen 4th Generation wRfx: NONREACTIVE

## 2017-10-06 MED ORDER — DIPHENHYDRAMINE HCL 50 MG/ML IJ SOLN
50.0000 mg | Freq: Once | INTRAMUSCULAR | Status: AC
  Administered 2017-10-06: 50 mg via INTRAVENOUS
  Filled 2017-10-06: qty 1

## 2017-10-06 MED ORDER — ACETAMINOPHEN 325 MG PO TABS
650.0000 mg | ORAL_TABLET | Freq: Four times a day (QID) | ORAL | Status: DC | PRN
  Administered 2017-10-06 – 2017-10-09 (×3): 650 mg via ORAL
  Filled 2017-10-06 (×3): qty 2

## 2017-10-06 MED ORDER — IPRATROPIUM-ALBUTEROL 0.5-2.5 (3) MG/3ML IN SOLN
3.0000 mL | Freq: Four times a day (QID) | RESPIRATORY_TRACT | Status: DC
  Administered 2017-10-06 – 2017-10-08 (×7): 3 mL via RESPIRATORY_TRACT
  Filled 2017-10-06 (×8): qty 3

## 2017-10-06 MED ORDER — METHYLPREDNISOLONE SODIUM SUCC 125 MG IJ SOLR
60.0000 mg | Freq: Three times a day (TID) | INTRAMUSCULAR | Status: DC
  Administered 2017-10-06 – 2017-10-09 (×9): 60 mg via INTRAVENOUS
  Filled 2017-10-06 (×10): qty 2

## 2017-10-06 MED ORDER — ALBUTEROL SULFATE (2.5 MG/3ML) 0.083% IN NEBU
2.5000 mg | INHALATION_SOLUTION | RESPIRATORY_TRACT | Status: DC | PRN
  Administered 2017-10-07 – 2017-10-12 (×3): 2.5 mg via RESPIRATORY_TRACT
  Filled 2017-10-06 (×2): qty 3

## 2017-10-06 MED ORDER — AMLODIPINE BESYLATE 2.5 MG PO TABS
2.5000 mg | ORAL_TABLET | Freq: Every day | ORAL | Status: DC
  Administered 2017-10-06 – 2017-10-08 (×3): 2.5 mg via ORAL
  Filled 2017-10-06 (×3): qty 1

## 2017-10-06 MED ORDER — IOPAMIDOL (ISOVUE-300) INJECTION 61%
100.0000 mL | Freq: Once | INTRAVENOUS | Status: AC | PRN
  Administered 2017-10-06: 100 mL via INTRAVENOUS

## 2017-10-06 NOTE — ED Notes (Signed)
Pt sitting on the side of bed, eating lunch. Wearing 4 L [place].

## 2017-10-06 NOTE — ED Notes (Signed)
Dr. Blaine Hamper contacted about pre medications for CT scan. Also notified that the patient did not require Bipap. MD acknowledged. Orders received

## 2017-10-06 NOTE — ED Notes (Signed)
Text paged admitting MD regarding troponin. Lab didn't call to report, as previous troponin was 0.05 today at 0606.

## 2017-10-06 NOTE — Progress Notes (Signed)
Alert and oriented x4 female in no immediate distress admitted to rm 28. Oriented to call bell. Bed alarm in place. 02 at 4/L [place].

## 2017-10-06 NOTE — Progress Notes (Signed)
[facility] TEAM 1 - Stepdown/ICU TEAM  [name]  JIR:[id number] DOB: September 18, 1935 DOA: 10/05/2017 PCP: Leonard Downing, MD     Brief Narrative:  82 y.o. female with a history of HTN, HLD, COPD, GERD, hearing loss, CAD w/ stent placement, and depression who presented with shortness breath for several days, cough, abdominal pain, and chest pain.  ED workup was mostly unremarkable w/ exception to an oxygen sat of 79%.  CXR was w/o acute findings.   Significant Events: 11/18 admit   Subjective: Feels somewhat better, but not yet back to her baseline.  C/o ongoing SOB, as well some chest discomfort which is described as a constant heaviness.  She denies HA or n/v, but c/o diffuse abdom discomfort.    Assessment & Plan:  Acute on chronic hypoxic resp failure - acute COPD exacerbation  Flu negative - no focal infiltrate on CXR - wheezing diffusely - cont nebs and steroids - follow   CAD w/ multiple stents - chest pain Pain not typical angina - had a negative stress perfusion study in Jan 2016 - no acute changes on admit EKG   Abdom pain Lipase normal - CT abd/pelvis w/o acute findings - exam not revealing   HTN Poorly controlled - adjust tx and follow  HLD  Cont home medical tx   DVT prophylaxis: lovenox Code Status: FULL CODE Family Communication: no family present at time of exam  Disposition Plan:   Consultants:  none  Antimicrobials:  Azithro 11/18 >  Objective: Blood pressure (!) 159/95, pulse 99, temperature 97.9 F (36.6 C), temperature source Oral, resp. rate 19, height 5\' 4"  (1.626 m), weight 88.5 kg (195 lb), SpO2 97 %.  Intake/Output Summary (Last 24 hours) at 10/06/2017 0826 Last data filed at 10/06/2017 0130 Gross per 24 hour  Intake 50 ml  Output -  Net 50 ml   Filed Weights   10/05/17 1050 10/06/17 0733  Weight: 89.8 kg (198 lb) 88.5 kg (195 lb)    Examination: General: No acute respiratory distress Lungs: diffuse wheeze - no focal  crackles - exp phase not prolonged  Cardiovascular: Regular rate and rhythm without murmur gallop or rub normal S1 and S2 Abdomen: Diffusely mildly tender, nondistended, soft, bowel sounds positive, no rebound, no ascites, no appreciable mass Extremities: trace B LE edema   CBC: Recent Labs  Lab 10/05/17 1600  WBC 9.1  NEUTROABS 6.0  HGB 14.2  HCT 44.2  MCV 96.7  PLT 751   Basic Metabolic Panel: Recent Labs  Lab 10/05/17 1600  NA 139  K 3.6  CL 102  CO2 28  GLUCOSE 96  BUN 13  CREATININE 0.96  CALCIUM 9.6   GFR: Estimated Creatinine Clearance: 50.3 mL/min (by C-G formula based on SCr of 0.96 mg/dL).  Liver Function Tests: Recent Labs  Lab 10/05/17 1600  AST 19  ALT 11*  ALKPHOS 62  BILITOT 0.5  PROT 6.9  ALBUMIN 3.9   Recent Labs  Lab 10/05/17 1600  LIPASE 36    Cardiac Enzymes: Recent Labs  Lab 10/05/17 2329 10/06/17 0447  TROPONINI <0.03 0.05*    HbA1C: Hgb A1c MFr Bld  Date/Time Value Ref Range Status  10/06/2017 04:47 AM 6.5 (H) 4.8 - 5.6 % Final    Comment:    (NOTE) Pre diabetes:          5.7%-6.4% Diabetes:              >6.4% Glycemic control for   <7.0%  adults with diabetes   11/29/2014 04:32 AM 6.2 (H) <5.7 % Final    Comment:    (NOTE)                                                                       According to the ADA Clinical Practice Recommendations for 2011, when HbA1c is used as a screening test:  >=6.5%   Diagnostic of Diabetes Mellitus           (if abnormal result is confirmed) 5.7-6.4%   Increased risk of developing Diabetes Mellitus References:Diagnosis and Classification of Diabetes Mellitus,Diabetes ZGYF,7494,49(QPRFF 1):S62-S69 and Standards of Medical Care in         Diabetes - 2011,Diabetes MBWG,6659,93 (Suppl 1):S11-S61.      Scheduled Meds: . amLODipine  2.5 mg Oral Daily  . aspirin  325 mg Oral Daily  . azithromycin  250 mg Oral Daily  . budesonide  0.25 mg Nebulization BID  . cholecalciferol   1,000 Units Oral Daily  . docusate sodium  100 mg Oral Q12H  . enoxaparin (LOVENOX) injection  40 mg Subcutaneous Q24H  . famotidine  40 mg Oral QHS  . guaiFENesin  600 mg Oral BID  . ipratropium-albuterol  3 mL Nebulization Q4H  . isosorbide mononitrate  30 mg Oral Daily  . methylPREDNISolone (SOLU-MEDROL) injection  60 mg Intravenous TID  . polyethylene glycol  17 g Oral Daily  . psyllium  1 packet Oral BID     LOS: 1 day   Cherene Altes, MD [facility] Office  [phone] Pager - Text Page per Amion as per below:  On-Call/Text Page:      [email or web address]      password [email or web address]  If 7PM-7AM, please contact night-coverage [email or web address] Password [email or web address] 10/06/2017, 8:26 AM

## 2017-10-06 NOTE — ED Notes (Signed)
Hospital bed ordered.

## 2017-10-06 NOTE — ED Notes (Signed)
Spoke with Dr. Thereasa Solo regarding elevated troponin, and PT. Will hold off on PT for today.

## 2017-10-07 ENCOUNTER — Inpatient Hospital Stay (HOSPITAL_COMMUNITY): Payer: Medicare Other

## 2017-10-07 ENCOUNTER — Encounter (HOSPITAL_COMMUNITY): Payer: Self-pay | Admitting: *Deleted

## 2017-10-07 DIAGNOSIS — J9621 Acute and chronic respiratory failure with hypoxia: Secondary | ICD-10-CM

## 2017-10-07 DIAGNOSIS — R079 Chest pain, unspecified: Secondary | ICD-10-CM

## 2017-10-07 DIAGNOSIS — R1030 Lower abdominal pain, unspecified: Secondary | ICD-10-CM

## 2017-10-07 LAB — COMPREHENSIVE METABOLIC PANEL
ALBUMIN: 3.2 g/dL — AB (ref 3.5–5.0)
ALK PHOS: 50 U/L (ref 38–126)
ALT: 13 U/L — ABNORMAL LOW (ref 14–54)
AST: 17 U/L (ref 15–41)
Anion gap: 6 (ref 5–15)
BILIRUBIN TOTAL: 0.1 mg/dL — AB (ref 0.3–1.2)
BUN: 25 mg/dL — AB (ref 6–20)
CALCIUM: 9.1 mg/dL (ref 8.9–10.3)
CO2: 29 mmol/L (ref 22–32)
CREATININE: 1.07 mg/dL — AB (ref 0.44–1.00)
Chloride: 102 mmol/L (ref 101–111)
GFR calc Af Amer: 55 mL/min — ABNORMAL LOW (ref >60)
GFR, EST NON AFRICAN AMERICAN: 48 mL/min — AB (ref >60)
GLUCOSE: 151 mg/dL — AB (ref 65–99)
Potassium: 4.5 mmol/L (ref 3.5–5.1)
Sodium: 137 mmol/L (ref 135–145)
Total Protein: 5.6 g/dL — ABNORMAL LOW (ref 6.5–8.1)

## 2017-10-07 LAB — ECHOCARDIOGRAM COMPLETE
Height: 64 in
Weight: 3156.8 oz

## 2017-10-07 LAB — CBC
HEMATOCRIT: 40 % (ref 36.0–46.0)
HEMOGLOBIN: 12.6 g/dL (ref 12.0–15.0)
MCH: 30.7 pg (ref 26.0–34.0)
MCHC: 31.5 g/dL (ref 30.0–36.0)
MCV: 97.3 fL (ref 78.0–100.0)
Platelets: 215 10*3/uL (ref 150–400)
RBC: 4.11 MIL/uL (ref 3.87–5.11)
RDW: 13.3 % (ref 11.5–15.5)
WBC: 15.1 10*3/uL — AB (ref 4.0–10.5)

## 2017-10-07 NOTE — Progress Notes (Signed)
  Echocardiogram 2D Echocardiogram has been performed.  Tresa Res 10/07/2017, 2:22 PM

## 2017-10-07 NOTE — Progress Notes (Signed)
Progress Note    [name]  WNU:[id number] DOB: 01/24/1935  DOA: 10/05/2017 PCP: Leonard Downing, MD    Brief Narrative:   Chief complaint: Follow-up COPD exacerbation  Medical records reviewed and are as summarized below:  [name] is an 82 y.o. female with a PMH of hypertension, hyperlipidemia, COPD, GERD, CAD status post stent, and depression who was admitted 10/05/17 for evaluation of shortness of breath, abdominal pain and chest pain. She was placed on BiPAP on admission due to hypoxia associated with tachypnea. Chest x-ray unrevealing.  Assessment/Plan:   Principal Problem: Acute on chronic respiratory failure with hypoxia due to COPD exacerbation [facility]) Patient is a very poor historian, but she was noted to haveugh, wheezing and negative chest x-ray, consistent with COPD exacerbation. admitted to the stepdown unit and placed on BiPAP. Continue bronchodilators, Pulmicort, Solu-Medrol, oral azithromycin, Mucinex and incentive spirometry. Follow-up strep pneumonia antigen, blood cultures, sputum culture and respiratory virus panel.   Active problems: Hx of CAD and chest pain She reported intermittent mild chest pain in the substernal area, but cannot characterize it further. No signs of DVT. CXR negative. Mildly elevated cardiac markers with a maximum value of 0.08 noted. Follow-up 2-D echo. Continue morphine and nitroglycerin when necessary as well as aspirin and Imdur. Follow-up FLP and 2-D echocardiogram. Suspect troponin elevation due to demand ischemia in the setting of hypoxia.   Abdominal pain Etiology is not clear. Lipase is 36.CT of the abdomen/pelvis negative for acute findings.   HTN: Continue home medications: Amlodipine and IV hydralazine prn. Systolic blood pressures have been elevated.   Depression Stable, no suicidal or homicidal ideations. Continue home medications: Nortriptyline.    Obesity  Body mass index is 33.87 kg/m.   Family  Communication/Anticipated D/C date and plan/Code Status   DVT prophylaxis: Lovenox ordered. Code Status: Full Code.  Family Communication: No family at the bedside. Attempted to call daughter, no answer. Disposition Plan: Home when respiratory status improved.   Medical Consultants:    None.   Anti-Infectives:    Azithromycin 10/05/17--->  Subjective:   Patient continues to complain of shortness breath and lower abdominal pain associated with swelling. No nausea or vomiting.  Objective:    Vitals:   10/07/17 0627 10/07/17 0637 10/07/17 0807 10/07/17 0818  BP: (!) 176/85 (!) 164/68 (!) 171/91   Pulse: 92 93 (!) 110 (!) 103  Resp:   18 18  Temp:   (!) 97.5 F (36.4 C)   TempSrc:   Oral   SpO2:   91% 93%  Weight:      Height:        Intake/Output Summary (Last 24 hours) at 10/07/2017 0826 Last data filed at 10/07/2017 0809 Gross per 24 hour  Intake 240 ml  Output 1150 ml  Net -910 ml   Filed Weights   10/06/17 0733 10/06/17 1800 10/07/17 0603  Weight: 88.5 kg (195 lb) 89.6 kg (197 lb 9.6 oz) 89.5 kg (197 lb 4.8 oz)    Exam: General: No acute distress. Cardiovascular: Heart sounds show a regular rate, and rhythm. No gallops or rubs. No murmurs. No JVD. Lungs: Wheezing bilaterally with mildly increased work of breathing. Abdomen: Soft/obese, tender lower abdomen, nondistended with normal active bowel sounds. No masses. No hepatosplenomegaly. Skin: Warm and dry. No rashes or lesions. Extremities: No clubbing or cyanosis. 1+ edema. Pedal pulses 2+. Psychiatric: Mood and affect are anxious. Insight and judgment are poor.   Data Reviewed:  I have personally reviewed following labs and imaging studies:  Labs: Labs show the following:   Basic Metabolic Panel: Recent Labs  Lab 10/05/17 1600 10/07/17 0535  NA 139 137  K 3.6 4.5  CL 102 102  CO2 28 29  GLUCOSE 96 151*  BUN 13 25*  CREATININE 0.96 1.07*  CALCIUM 9.6 9.1   GFR Estimated Creatinine  Clearance: 45.4 mL/min (A) (by C-G formula based on SCr of 1.07 mg/dL (H)). Liver Function Tests: Recent Labs  Lab 10/05/17 1600 10/07/17 0535  AST 19 17  ALT 11* 13*  ALKPHOS 62 50  BILITOT 0.5 0.1*  PROT 6.9 5.6*  ALBUMIN 3.9 3.2*   Recent Labs  Lab 10/05/17 1600  LIPASE 36   CBC: Recent Labs  Lab 10/05/17 1600 10/07/17 0535  WBC 9.1 15.1*  NEUTROABS 6.0  --   HGB 14.2 12.6  HCT 44.2 40.0  MCV 96.7 97.3  PLT 214 215   Cardiac Enzymes: Recent Labs  Lab 10/05/17 2329 10/06/17 0447 10/06/17 1200  TROPONINI <0.03 0.05* 0.08*   Hgb A1c: Recent Labs    10/06/17 0447  HGBA1C 6.5*   Lipid Profile: Recent Labs    10/06/17 0448  CHOL 194  HDL 64  LDLCALC 122*  TRIG 38  CHOLHDL 3.0    Microbiology No results found for this or any previous visit (from the past 240 hour(s)).  Procedures and diagnostic studies:  Dg Chest 2 View  Result Date: 10/05/2017 CLINICAL DATA:  Generalized body aches, particularly in the pelvis. Last bowel movement 2 days ago. Chest pain. History of coronary artery disease, COPD, hypertension. EXAM: CHEST  2 VIEW COMPARISON:  Chest x-rays dated 08/16/2017 and 10/15/2016. FINDINGS: Mild cardiomegaly is stable. Atherosclerotic changes again noted at the aortic arch. Lungs are clear. No pleural effusion or pneumothorax seen. Mild degenerative spurring within the thoracic spine. No acute or suspicious osseous finding. IMPRESSION: No active cardiopulmonary disease. No evidence of pneumonia or pulmonary edema. Stable cardiomegaly. Aortic atherosclerosis. Electronically Signed   By: Franki Cabot M.D.   On: 10/05/2017 16:18   Ct Abdomen Pelvis W Contrast  Result Date: 10/06/2017 CLINICAL DATA:  Generalized abdominal pain.  06/25/2017. EXAM: CT ABDOMEN AND PELVIS WITH CONTRAST TECHNIQUE: Multidetector CT imaging of the abdomen and pelvis was performed using the standard protocol following bolus administration of intravenous contrast. CONTRAST:   136mL ISOVUE-300 IOPAMIDOL (ISOVUE-300) INJECTION 61% COMPARISON:  06/25/2017 FINDINGS: Lower chest: Bibasilar atelectasis noted with atelectasis again evident in the right middle lobe. Hepatobiliary: No focal abnormality within the liver parenchyma. Probable non mineralized stones in the gallbladder. No intrahepatic or extrahepatic biliary dilation. Pancreas: No focal mass lesion. No dilatation of the main duct. No intraparenchymal cyst. No peripancreatic edema. Spleen: No splenomegaly. No focal mass lesion. Adrenals/Urinary Tract: Stable 16 mm left adrenal nodule comparing to CT of 01/31/2013. Right adrenal gland unremarkable. Bilateral low-density renal lesions are similar to prior and likely cysts. There is cortical scarring in the kidneys, left greater than right. No evidence for hydroureter. The urinary bladder appears normal for the degree of distention. Stomach/Bowel: Stomach is nondistended. No gastric wall thickening. No evidence of outlet obstruction. Duodenum is normally positioned as is the ligament of Treitz. No small bowel wall thickening. No small bowel dilatation. The terminal ileum is normal. The appendix is not visualized, but there is no edema or inflammation in the region of the cecum. Diverticular changes are noted in the left colon without evidence of diverticulitis. Vascular/Lymphatic: There is abdominal  aortic atherosclerosis without aneurysm. There is no gastrohepatic or hepatoduodenal ligament lymphadenopathy. No intraperitoneal or retroperitoneal lymphadenopathy. No pelvic sidewall lymphadenopathy. Reproductive: Uterus surgically absent.  There is no adnexal mass. Other: No intraperitoneal free fluid. Musculoskeletal: Bone windows reveal no worrisome lytic or sclerotic osseous lesions. Patient is status post extensive lumbar fusion. IMPRESSION: 1. No acute findings in the abdomen or pelvis. 2. Bibasilar atelectasis. 3. Left colonic diverticulosis without diverticulitis. 4.  Aortic  Atherosclerois (ICD10-170.0) 5. Cholelithiasis. 6. Bilateral renal cysts with cortical scarring in the kidneys bilaterally. 7. Stable left adrenal nodule compatible with adenoma. Electronically Signed   By: Misty Stanley M.D.   On: 10/06/2017 07:31   Dg Hips Bilat W Or Wo Pelvis 2 Views  Result Date: 10/06/2017 CLINICAL DATA:  Right hip pain EXAM: DG HIP (WITH OR WITHOUT PELVIS) 2V BILAT COMPARISON:  None FINDINGS: There is no evidence of hip fracture or dislocation. There is no evidence of arthropathy or other focal bone abnormality. Lumbar fusion hardware noted. IMPRESSION: Negative. Electronically Signed   By: Franchot Gallo M.D.   On: 10/06/2017 06:31    Medications:   . amLODipine  2.5 mg Oral Daily  . aspirin  325 mg Oral Daily  . azithromycin  250 mg Oral Daily  . budesonide  0.25 mg Nebulization BID  . cholecalciferol  1,000 Units Oral Daily  . docusate sodium  100 mg Oral Q12H  . enoxaparin (LOVENOX) injection  40 mg Subcutaneous Q24H  . famotidine  40 mg Oral QHS  . guaiFENesin  600 mg Oral BID  . ipratropium-albuterol  3 mL Nebulization QID  . isosorbide mononitrate  30 mg Oral Daily  . methylPREDNISolone (SOLU-MEDROL) injection  60 mg Intravenous Q8H  . polyethylene glycol  17 g Oral Daily  . psyllium  1 packet Oral BID   Continuous Infusions:   LOS: 2 days   Jacquelynn Cree  [facility] Pager [phone]. If unable to reach me by pager, please call my cell phone at [phone].  *Please refer to [email or web address], password [email or web address] to get updated schedule on who will round on this patient, as hospitalists switch teams weekly. If 7PM-7AM, please contact night-coverage at [email or web address], password [email or web address] for any overnight needs.  10/07/2017, 8:26 AM

## 2017-10-07 NOTE — Evaluation (Signed)
Physical Therapy Evaluation Patient Details Name: [name] MRN: [id number] DOB: 01-24-1935 Today's Date: 10/07/2017   History of Present Illness  82 y.o. female admitted on 10/05/17 for chest pain and SOB.  Mildly elevated troponin thought to be demand ischemia.  Pt with significant PMH of HTN, COPD (on 2 L O2 [place] at home), CAD, coronary angioplasty with stent, and lumbar spine surgery.  Clinical Impression  Pt with significant activity intolerance dropping O2 sats to 77% on 2 L O2 [place] during gait with HR up to 124 bpm.  Turned O2 up to 3 L O2 and pt did better, but still dropped to 85% on 3 L O2 [place] with gait.  3-5 min rest to recover sats both times.  Physically, pt mobilizes well, but she has very poor activity tolerance.  Asked RN to put in referral for mobility tech and PT will continue to follow acutely for activity progression.  Pt reports she has had home therapy in the past and she did not feel she got any benefit.  Maybe she would be appropriate for OP Pulmonary rehab program?   PT to follow acutely for deficits listed below.       Follow Up Recommendations Supervision - Intermittent;Other (comment)(pulmonary rehab? she did not feel HH PT was helpful)    Equipment Recommendations  Other (comment)(shower chair)    Recommendations for Other Services Other (comment)(mobility tech)     Precautions / Restrictions Precautions Precautions: Other (comment) Precaution Comments: monitor O2 sats and HR      Mobility  Bed Mobility Overal bed mobility: Modified Independent             General bed mobility comments: HOB elevated and pt using rail, but came to EOB easily without struggle.   Transfers Overall transfer level: Needs assistance Equipment used: None Transfers: Sit to/from Stand Sit to Stand: Supervision         General transfer comment: supervision for safety due to reliance on hands for transitions, and O2 line dangling near her feet.    Ambulation/Gait Ambulation/Gait assistance: Min guard   Assistive device: Rolling walker (2 wheeled) Gait Pattern/deviations: Step-through pattern;Trunk flexed     General Gait Details: You can tell pt is not used to using a RW for gait (she normally furniture walks at home) as she kept getting her feet outside of the support of the RW. She is very fast to move and has poor respiratory tolerance to activity.  DOE 2-3/4 and O2 sats with gait on 2 L O2 [place] were 77%.  We took a standing rest break for ~5 mins and preformed pursed lip breathing and PT turned O2 up to 3 L and she slowly got back up to 90%.  We walked back to her room on 3 L O2 [place] and O2 sats dropped to 85% with gait and rebounded over 3 mins back to 90% with seated rest break in her room.  I left her on 2 L O2 [place] at rest at the end of our session and relayed vitals to RN.  HR ranged from 100-124 during gait.          Balance Overall balance assessment: Needs assistance Sitting-balance support: Feet supported;No upper extremity supported Sitting balance-Leahy Scale: Good     Standing balance support: No upper extremity supported;Bilateral upper extremity supported;Single extremity supported Standing balance-Leahy Scale: Good Standing balance comment: Pt only really needs some light hand touches on furniture for balance during gait, but she gets "shaky" after  her breathing treatments, so we used a RW this walk.                              Pertinent Vitals/Pain Pain Assessment: No/denies pain    Home Living Family/patient expects to be discharged to:: Private residence Living Arrangements: Other relatives;Other (Comment)(sister who is on disability) Available Help at Discharge: Family;Available 24 hours/day(supervision) Type of Home: Mobile home Home Access: Stairs to enter Entrance Stairs-Rails: Right;Left;Can reach both Entrance Stairs-Number of Steps: 5 Home Layout: One level Home Equipment: Walker - 4  wheels;Bedside commode;Other (comment)(home O2 and finger pulse ox)      Prior Function Level of Independence: Needs assistance   Gait / Transfers Assistance Needed: Per pt report she is homebound mostly and her sister drives her car when needed.  She cooks, but doesn't really clean and uses 2 L O2 [place] at home.  She reports her lung doctor put her on breathing treatments that made her sick and instead of calling the MD and letting him know, she just stopped the treatements.               Extremity/Trunk Assessment   Upper Extremity Assessment Upper Extremity Assessment: Generalized weakness    Lower Extremity Assessment Lower Extremity Assessment: Generalized weakness    Cervical / Trunk Assessment Cervical / Trunk Assessment: Other exceptions Cervical / Trunk Exceptions: h/o lumbar spine surgery  Communication   Communication: HOH  Cognition Arousal/Alertness: Awake/alert Behavior During Therapy: WFL for tasks assessed/performed Overall Cognitive Status: Within Functional Limits for tasks assessed                                 General Comments: Appears WNL, needs repeitition at times due to hearing.              Assessment/Plan    PT Assessment Patient needs continued PT services  PT Problem List Decreased strength;Decreased activity tolerance;Decreased balance;Decreased mobility;Decreased knowledge of use of DME;Decreased knowledge of precautions;Cardiopulmonary status limiting activity       PT Treatment Interventions DME instruction;Gait training;Stair training;Therapeutic activities;Functional mobility training;Therapeutic exercise;Balance training;Patient/family education    PT Goals (Current goals can be found in the Care Plan section)  Acute Rehab PT Goals Patient Stated Goal: to get her breathing better PT Goal Formulation: With patient Time For Goal Achievement: 10/21/17 Potential to Achieve Goals: Good    Frequency Min 3X/week            AM-PAC PT "6 Clicks" Daily Activity  Outcome Measure Difficulty turning over in bed (including adjusting bedclothes, sheets and blankets)?: None Difficulty moving from lying on back to sitting on the side of the bed? : None Difficulty sitting down on and standing up from a chair with arms (e.g., wheelchair, bedside commode, etc,.)?: None Help needed moving to and from a bed to chair (including a wheelchair)?: A Little Help needed walking in hospital room?: A Little Help needed climbing 3-5 steps with a railing? : A Little 6 Click Score: 21    End of Session Equipment Utilized During Treatment: Gait belt;Oxygen Activity Tolerance: Other (comment)(limited by DOE and decreased O2 sats) Patient left: in chair;with call bell/phone within reach Nurse Communication: Mobility status;Other (comment)(decreased sats, recommend mobility tech) PT Visit Diagnosis: Muscle weakness (generalized) (M62.81);Other (comment)(decreased activity tolerance)    Time: 4008-6761 PT Time Calculation (min) (ACUTE ONLY): 42 min   Charges:  Barbarann Ehlers Desi Rowe, PT, DPT [phone]   PT Evaluation $PT Eval Moderate Complexity: 1 Mod PT Treatments $Gait Training: 8-22 mins $Therapeutic Activity: 8-22 mins   10/07/2017, 4:41 PM

## 2017-10-07 NOTE — Care Management Note (Signed)
Case Management Note  Patient Details  Name: [name] MRN: [id number] Date of Birth: 01-12-1935  Subjective/Objective:   Acute on chronic hypoxic resp failure, COPD                 Action/Plan: Discharge Planning: NCM spoke to pt and offered choice for HH/list provided. Pt agreeable to [facility] for [facility]. Contacted [facility] with new referral. Will need [place] RN orders with F2F.   PCP Leonard Downing MD  Expected Discharge Date:                  Expected Discharge Plan:  [place]  In-House Referral:  NA  Discharge planning Services  CM Consult  Post Acute Care Choice:  Home Health Choice offered to:     DME Arranged:    DME Agency:     HH Arranged:    [place]:  [place]  Status of Service:  In process, will continue to follow  If discussed at Long Length of Stay Meetings, dates discussed:    Additional Comments:  Erenest Rasher, RN 10/07/2017, 11:20 AM

## 2017-10-07 NOTE — Plan of Care (Signed)
  Activity: Risk for activity intolerance will decrease 10/07/2017 0120 - Progressing by Antonin Meininger A, RN   Nutrition: Adequate nutrition will be maintained 10/07/2017 0120 - Progressing by Myah Guynes A, RN   Nutrition: Adequate nutrition will be maintained 10/07/2017 0120 - Progressing by Yvanna Vidas A, RN   Activity: Risk for activity intolerance will decrease 10/07/2017 0120 - Progressing by Tykee Heideman A, RN   Elimination: Will not experience complications related to bowel motility 10/07/2017 0120 - Progressing by Matisse Roskelley, Roma Kayser, RN

## 2017-10-07 NOTE — Plan of Care (Signed)
Patient stable during 7 a to 7 p shift, maintains oxygen saturation in the 90's on 2 liters sitting, see PT note for decrease saturation with ambulation.  Patient c/o feeling "sick" at times but is non-specific other than hurts all other, controlled with Tylenol.  Family at bedside for part of shift.  Patient sat up in chair for several hours.

## 2017-10-08 ENCOUNTER — Encounter (HOSPITAL_COMMUNITY): Payer: Self-pay

## 2017-10-08 DIAGNOSIS — I25118 Atherosclerotic heart disease of native coronary artery with other forms of angina pectoris: Secondary | ICD-10-CM

## 2017-10-08 DIAGNOSIS — I2510 Atherosclerotic heart disease of native coronary artery without angina pectoris: Secondary | ICD-10-CM

## 2017-10-08 LAB — TROPONIN I
TROPONIN I: 0.19 ng/mL — AB (ref <0.03)
TROPONIN I: 0.14 ng/mL — AB (ref <0.03)
TROPONIN I: 0.14 ng/mL — AB (ref <0.03)

## 2017-10-08 LAB — BASIC METABOLIC PANEL
Anion gap: 8 (ref 5–15)
BUN: 32 mg/dL — ABNORMAL HIGH (ref 6–20)
CALCIUM: 9.5 mg/dL (ref 8.9–10.3)
CO2: 29 mmol/L (ref 22–32)
Chloride: 100 mmol/L — ABNORMAL LOW (ref 101–111)
Creatinine, Ser: 1.07 mg/dL — ABNORMAL HIGH (ref 0.44–1.00)
GFR, EST AFRICAN AMERICAN: 55 mL/min — AB (ref >60)
GFR, EST NON AFRICAN AMERICAN: 48 mL/min — AB (ref >60)
Glucose, Bld: 167 mg/dL — ABNORMAL HIGH (ref 65–99)
POTASSIUM: 5.1 mmol/L (ref 3.5–5.1)
Sodium: 137 mmol/L (ref 135–145)

## 2017-10-08 MED ORDER — PANTOPRAZOLE SODIUM 40 MG PO TBEC
40.0000 mg | DELAYED_RELEASE_TABLET | Freq: Every day | ORAL | Status: DC
  Administered 2017-10-08 – 2017-10-12 (×5): 40 mg via ORAL
  Filled 2017-10-08 (×5): qty 1

## 2017-10-08 MED ORDER — AMLODIPINE BESYLATE 5 MG PO TABS
5.0000 mg | ORAL_TABLET | Freq: Every day | ORAL | Status: DC
  Administered 2017-10-09: 5 mg via ORAL
  Filled 2017-10-08: qty 1

## 2017-10-08 MED ORDER — LEVALBUTEROL HCL 0.63 MG/3ML IN NEBU
0.6300 mg | INHALATION_SOLUTION | Freq: Four times a day (QID) | RESPIRATORY_TRACT | Status: DC
  Administered 2017-10-08 – 2017-10-09 (×4): 0.63 mg via RESPIRATORY_TRACT
  Filled 2017-10-08 (×5): qty 3

## 2017-10-08 MED ORDER — NITROGLYCERIN 2 % TD OINT
0.5000 [in_us] | TOPICAL_OINTMENT | Freq: Three times a day (TID) | TRANSDERMAL | Status: DC
  Administered 2017-10-08 – 2017-10-09 (×3): 0.5 [in_us] via TOPICAL
  Filled 2017-10-08: qty 30

## 2017-10-08 MED ORDER — IPRATROPIUM-ALBUTEROL 0.5-2.5 (3) MG/3ML IN SOLN: 3.0000 mL | Freq: Three times a day (TID) | RESPIRATORY_TRACT | Status: DC

## 2017-10-08 MED ORDER — ALPRAZOLAM 0.25 MG PO TABS
0.2500 mg | ORAL_TABLET | Freq: Four times a day (QID) | ORAL | Status: DC
  Administered 2017-10-08 – 2017-10-09 (×8): 0.25 mg via ORAL
  Filled 2017-10-08 (×9): qty 1

## 2017-10-08 MED ORDER — GI COCKTAIL ~~LOC~~
30.0000 mL | Freq: Once | ORAL | Status: AC
  Administered 2017-10-08: 30 mL via ORAL
  Filled 2017-10-08: qty 30

## 2017-10-08 NOTE — Care Management Important Message (Signed)
Important Message  Patient Details  Name: [name] MRN: [id number] Date of Birth: 04-15-1935   Medicare Important Message Given:  Yes    Demetrios Byron [address] 10/08/2017, 12:58 PM

## 2017-10-08 NOTE — Consult Note (Signed)
Cardiology Consult    Patient ID: [name] MRN: [id number], DOB/AGE: Nov 17, 1935   Admit date: 10/05/2017 Date of Consult: 10/08/2017  Primary Physician: Leonard Downing, MD Primary Cardiologist: Dr. Percival Spanish Requesting Provider: Dr. Rockne Menghini  Reason for Consult: elevated troponin and chest pain  Patient Profile   [name] is a 82 y.o. female with PMH of COPD on chronic O2, GERD s/p esophageal dilation, CAD s/p multiple stents, HTN, HLD, h/o SCC of nose s/p radiation in 2015 who presented to [facility] with multiple complaints of chest pain, abd pain, shortness of breath and found to be having a COPD exacerbation. Her troponins have trended up and echo obtained on 11/20 show no regional wall motion abnormalities, and normal EF.   Past Medical History   Past Medical History:  Diagnosis Date  . Arthritis    "aching bones sometimes"  . Blurred vision   . CAD (coronary artery disease)    Multiple percutaneous revascularization. Catheterization May 2013 left main normal, patent LAD stent, 90% circumflex stenosis, patent right coronary artery stents. Patient had a DES to the circumflex. The EF was well-preserved.  Marland Kitchen COPD (chronic obstructive pulmonary disease) ([place])   . Fatigue   . GERD (gastroesophageal reflux disease)   . Hard of hearing   . HTN (hypertension)   . Hx of radiation therapy    right nose 4500 cGy in 10 sessions over 5 weeks (2 treatments a week)  . Hyperlipemia   . Squamous cell carcinoma of nose    right    Past Surgical History:  Procedure Laterality Date  . ABDOMINAL HYSTERECTOMY    . CORONARY ANGIOPLASTY WITH STENT PLACEMENT  2002-2009   x 5  . CORONARY ANGIOPLASTY WITH STENT PLACEMENT  05/06/12   "1; this makes 5"  . ESOPHAGEAL DILATION  ~ 2012   Dr. Deatra Ina  . LUMBAR SPINE SURGERY  04/2011   Rods, screws and cages  . PERCUTANEOUS CORONARY STENT INTERVENTION (PCI-S) N/A 05/06/2012   Procedure: PERCUTANEOUS CORONARY STENT INTERVENTION (PCI-S);   Surgeon: Burnell Blanks, MD;  Location: [facility] CATH LAB;  Service: Cardiovascular;  Laterality: N/A;  . SKIN BIOPSY  10/15/13   right nose-microinvasive squamous cell carcinoma    Allergies  Allergies  Allergen Reactions  . Iohexol Other (See Comments)    Code:  HIVES, Desc:  PER ROBIN @ GI, PT IS ALLERGIC TO IVP DYE 08/28/10 RM  . Lipitor [Atorvastatin Calcium] Hives   History of Present Illness    [name] is an 82yo female with significant CAD disease s/p multiple stents, GERD with h/o gastric ulcers and esophageal strictures, COPD on chronic O2, HTN, HLD presenting with COPD exacerbation and intermittent chest pain.   Patient states she has had intermittent chest pain for about 2 years - when asked to point she points to epigastrium. It is sometimes worse with eating and she endorses occasional dysphagia to pills. This morning, pain was relieved with GI cocktail. She reports occasional nausea not related to the pain. She reports chronic dyspnea on exertion. Her previous anginal presentation was left arm pain which she is not having currently. She is a former 30 pack year smoker, denies family history of cardiac disease.   Of note, patient endorses not taking COPD meds regularly at all due to not knowing how and which ones to take when.   Inpatient Medications    . ALPRAZolam  0.25 mg Oral QID  . amLODipine  2.5 mg Oral Daily  .  aspirin  325 mg Oral Daily  . azithromycin  250 mg Oral Daily  . budesonide  0.25 mg Nebulization BID  . cholecalciferol  1,000 Units Oral Daily  . docusate sodium  100 mg Oral Q12H  . enoxaparin (LOVENOX) injection  40 mg Subcutaneous Q24H  . guaiFENesin  600 mg Oral BID  . isosorbide mononitrate  30 mg Oral Daily  . levalbuterol  0.63 mg Nebulization QID  . methylPREDNISolone (SOLU-MEDROL) injection  60 mg Intravenous Q8H  . nitroGLYCERIN  0.5 inch Topical Q8H  . pantoprazole  40 mg Oral Q0600  . polyethylene glycol  17 g Oral Daily  . psyllium   1 packet Oral BID   Outpatient Medications    Prior to Admission medications   Medication Sig Start Date End Date Taking? Authorizing Provider  albuterol (PROVENTIL HFA;VENTOLIN HFA) 108 (90 BASE) MCG/ACT inhaler Inhale 2 puffs into the lungs every 4 (four) hours as needed for wheezing. 12/01/14  Yes Delfina Redwood, MD  amLODipine (NORVASC) 2.5 MG tablet Take 2.5 tablets daily by mouth. 08/21/17  Yes [provider]  cholecalciferol (VITAMIN D) 1000 UNITS tablet Take 1,000 Units by mouth daily.    Yes [provider]  dicyclomine (BENTYL) 20 MG tablet Take 1 tablet (20 mg total) by mouth 3 (three) times daily as needed for spasms. Cramping abdominal pain. 08/16/17  Yes Charlesetta Shanks, MD  docusate sodium (COLACE) 100 MG capsule Take 1 capsule (100 mg total) by mouth every 12 (twelve) hours. 08/16/17  Yes Charlesetta Shanks, MD  guaiFENesin (MUCINEX) 600 MG 12 hr tablet Take 1 tablet (600 mg total) by mouth 2 (two) times daily. 02/03/13  Yes Kathie Dike, MD  isosorbide mononitrate (IMDUR) 30 MG 24 hr tablet Take 1 tablet (30 mg total) by mouth daily. 12/30/16  Yes Minus Breeding, MD  psyllium (METAMUCIL SMOOTH TEXTURE) 28 % packet Take 1 packet by mouth 2 (two) times daily.   Yes [provider]  ranitidine (ZANTAC) 300 MG tablet Take 300 mg by mouth at bedtime.    Yes [provider]  arformoterol (BROVANA) 15 MCG/2ML NEBU Take 2 mLs (15 mcg total) by nebulization 2 (two) times daily. Patient not taking: Reported on 10/05/2017 02/03/17   Mannam, Hart Robinsons, MD  budesonide (PULMICORT) 0.25 MG/2ML nebulizer solution Take 2 mLs (0.25 mg total) by nebulization 2 (two) times daily. 02/03/17 02/03/18  Mannam, Hart Robinsons, MD  nitroGLYCERIN (NITROSTAT) 0.4 MG SL tablet Place 0.4 mg under the tongue every 5 (five) minutes as needed for chest pain.    [provider]  nortriptyline (PAMELOR) 25 MG capsule Take 25 mg by mouth 2 (two) times daily.    [provider]  polyethylene glycol (MIRALAX / GLYCOLAX) packet Take 17 g by mouth daily. Patient not taking: Reported on 10/06/2017 08/16/17   Charlesetta Shanks, MD  polyethylene glycol [facility]) packet Take 17 g by mouth daily. Patient not taking: Reported on 10/05/2017 01/29/17   Davonna Belling, MD  traMADol (ULTRAM) 50 MG tablet Take 1 tablet (50 mg total) by mouth every 6 (six) hours as needed. Patient not taking: Reported on 10/05/2017 12/26/16   Tanna Furry, MD     Family History   Family History  Problem Relation Age of Onset  . Glaucoma Father   . Heart attack Father   . Bone cancer Mother    Social History    Social History   Socioeconomic History  . Marital status: Widowed    Spouse name: Not  on file  . Number of children: 4  . Years of education: Not on file  . Highest education level: Not on file  Social Needs  . Financial resource strain: Not on file  . Food insecurity - worry: Not on file  . Food insecurity - inability: Not on file  . Transportation needs - medical: Not on file  . Transportation needs - non-medical: Not on file  Occupational History  . Occupation: retired    Fish farm manager: RETIRED  Tobacco Use  . Smoking status: Former Smoker    Packs/day: 0.50    Years: 55.00    Pack years: 27.50    Types: Cigarettes  . Smokeless tobacco: Former Systems developer    Types: Snuff  . Tobacco comment: Quit earlier this year.   Substance and Sexual Activity  . Alcohol use: No    Alcohol/week: 0.0 oz  . Drug use: No  . Sexual activity: No    Comment: 1 cig to 1/4 of a pack a day  Other Topics Concern  . Not on file  Social History Narrative  . Not on file     Review of Systems    All other systems reviewed and are otherwise negative except as noted above.  Physical Exam    Blood pressure (!) 169/77, pulse 87, temperature 98.1 F (36.7 C), temperature source Oral, resp. rate 20, height 5\' 4"  (1.626 m), weight 198 lb 13.7 oz (90.2 kg), SpO2 92 %.  General:  Pleasant, NAD, lying in bed comfortably Psych: Normal affect. Neuro: Alert and oriented X 3. Moves all extremities spontaneously. HEENT: Normal, moist mucous membraines  Neck: No JVD. Lungs:  Decreased breath sounds throughout with diffuse wheezing, mild bibasilar crackles. Heart: RRR no s3, s4, or murmurs. Abdomen: Soft, distended, ++TTP of epigastric region, decreased bowel sounds.  Extremities: Minimal LE edema. Radials 2+/DP 1+ and equal bilaterally.  Labs    Troponin [facility] Test) Recent Labs    10/05/17 1603  TROPIPOC 0.02   Recent Labs    10/05/17 2329 10/06/17 0447 10/06/17 1200 10/08/17 0823  TROPONINI <0.03 0.05* 0.08* 0.19*   Lab Results  Component Value Date   WBC 15.1 (H) 10/07/2017   HGB 12.6 10/07/2017   HCT 40.0 10/07/2017   MCV 97.3 10/07/2017   PLT 215 10/07/2017    Recent Labs  Lab 10/07/17 0535 10/08/17 0823  NA 137 137  K 4.5 5.1  CL 102 100*  CO2 29 29  BUN 25* 32*  CREATININE 1.07* 1.07*  CALCIUM 9.1 9.5  PROT 5.6*  --   BILITOT 0.1*  --   ALKPHOS 50  --   ALT 13*  --   AST 17  --   GLUCOSE 151* 167*   Lab Results  Component Value Date   CHOL 194 10/06/2017   HDL 64 10/06/2017   LDLCALC 122 (H) 10/06/2017   TRIG 38 10/06/2017   No results found for: [facility]   Radiology Studies    Dg Chest 2 View  Result Date: 10/05/2017 CLINICAL DATA:  Generalized body aches, particularly in the pelvis. Last bowel movement 2 days ago. Chest pain. History of coronary artery disease, COPD, hypertension. EXAM: CHEST  2 VIEW COMPARISON:  Chest x-rays dated 08/16/2017 and 10/15/2016. FINDINGS: Mild cardiomegaly is stable. Atherosclerotic changes again noted at the aortic arch. Lungs are clear. No pleural effusion or pneumothorax seen. Mild degenerative spurring within the thoracic spine. No acute or suspicious osseous finding. IMPRESSION: No active cardiopulmonary disease. No evidence  of pneumonia or pulmonary edema. Stable cardiomegaly. Aortic  atherosclerosis. Electronically Signed   By: Franki Cabot M.D.   On: 10/05/2017 16:18   Ct Abdomen Pelvis W Contrast  Result Date: 10/06/2017 CLINICAL DATA:  Generalized abdominal pain.  06/25/2017. EXAM: CT ABDOMEN AND PELVIS WITH CONTRAST TECHNIQUE: Multidetector CT imaging of the abdomen and pelvis was performed using the standard protocol following bolus administration of intravenous contrast. CONTRAST:  151mL ISOVUE-300 IOPAMIDOL (ISOVUE-300) INJECTION 61% COMPARISON:  06/25/2017 FINDINGS: Lower chest: Bibasilar atelectasis noted with atelectasis again evident in the right middle lobe. Hepatobiliary: No focal abnormality within the liver parenchyma. Probable non mineralized stones in the gallbladder. No intrahepatic or extrahepatic biliary dilation. Pancreas: No focal mass lesion. No dilatation of the main duct. No intraparenchymal cyst. No peripancreatic edema. Spleen: No splenomegaly. No focal mass lesion. Adrenals/Urinary Tract: Stable 16 mm left adrenal nodule comparing to CT of 01/31/2013. Right adrenal gland unremarkable. Bilateral low-density renal lesions are similar to prior and likely cysts. There is cortical scarring in the kidneys, left greater than right. No evidence for hydroureter. The urinary bladder appears normal for the degree of distention. Stomach/Bowel: Stomach is nondistended. No gastric wall thickening. No evidence of outlet obstruction. Duodenum is normally positioned as is the ligament of Treitz. No small bowel wall thickening. No small bowel dilatation. The terminal ileum is normal. The appendix is not visualized, but there is no edema or inflammation in the region of the cecum. Diverticular changes are noted in the left colon without evidence of diverticulitis. Vascular/Lymphatic: There is abdominal aortic atherosclerosis without aneurysm. There is no gastrohepatic or hepatoduodenal ligament lymphadenopathy. No intraperitoneal or retroperitoneal lymphadenopathy. No pelvic  sidewall lymphadenopathy. Reproductive: Uterus surgically absent.  There is no adnexal mass. Other: No intraperitoneal free fluid. Musculoskeletal: Bone windows reveal no worrisome lytic or sclerotic osseous lesions. Patient is status post extensive lumbar fusion. IMPRESSION: 1. No acute findings in the abdomen or pelvis. 2. Bibasilar atelectasis. 3. Left colonic diverticulosis without diverticulitis. 4.  Aortic Atherosclerois (ICD10-170.0) 5. Cholelithiasis. 6. Bilateral renal cysts with cortical scarring in the kidneys bilaterally. 7. Stable left adrenal nodule compatible with adenoma. Electronically Signed   By: Misty Stanley M.D.   On: 10/06/2017 07:31   Dg Hips Bilat W Or Wo Pelvis 2 Views  Result Date: 10/06/2017 CLINICAL DATA:  Right hip pain EXAM: DG HIP (WITH OR WITHOUT PELVIS) 2V BILAT COMPARISON:  None FINDINGS: There is no evidence of hip fracture or dislocation. There is no evidence of arthropathy or other focal bone abnormality. Lumbar fusion hardware noted. IMPRESSION: Negative. Electronically Signed   By: Franchot Gallo M.D.   On: 10/06/2017 06:31   ECG & Cardiac Imaging    LHC 05/01/12 and 05/06/12: 1. Triple vessel CAD with patent stents in the LAD, diagonal, RCA. 2. Severe stenosis in the proximal Circumflex > Successful PTCA/DES x 1 proximal Circumflex artery 3. Preserved LV systolic function  Echo 41/93/7902: - Left ventricle: The cavity size was normal. There was moderate   concentric hypertrophy. Systolic function was normal. The   estimated ejection fraction was in the range of 60% to 65%. Wall   motion was normal; there were no regional wall motion   abnormalities. Doppler parameters are consistent with elevated   mean left atrial filling pressure. - Left atrium: The atrium was mildly dilated  Myoview 07/16/2013 and Jan 2016: No evidence of ischemia or infarct  EKG 10/07/17, personally reviewed - SR, RBB, TWI in III (new)  Assessment & Plan  Chest  pain>epigastric pain, troponemia: Previous anginal symptoms includes left arm pain. Likely demand ischemia in setting of COPD exacerbation and uncontrolled HTN. She endorses chest/epigastric pain relief with GI cocktail and had significant TTP of epigastric region. --no cardiology workup warranted at this time --would address HTN and GERD --increasing amlodipine to 5mg  daily --cardiology will follow  CAD s/p stent to LAD, diagonal, RCA and prox Cx: On imdur 30mg  daily and asa. She has an allergy to statins.  Acute on chronic hypoxic, hypercarbic respiratory failure COPD exacerbation: On home 2L [place] O2 at baseline. On azithro, steroids and nebs inpatient. Of note, patient would benefit from inhaler/neb teaching.  HTN: On amlodipine 2.5mg  daily (previously on 10mg  at last cardiology visit). Currently uncontrolled. --increase amlodipine to 5mg  daily  HLD: Not on therapy due to allergy to statins.  Aortic atherosclerosis: Noted on CT abd/pel 10/06/2017.  CKD stage 3: Stable.   Signed, Alphonzo Grieve, MD 10/08/2017, 3:48 PM   I have personally seen and examined this patient with Dr. Jari Favre. I agree with the assessment and plan as outlined above. JenniferHa has known obstructive CAD with previous coronary stent placement, last in 2013. She has had chest pain or left arm pain at home over the last year. Her anginal equivalent is left arm pain and chest pain. She has chronic abdominal/epigastric pain. Now admitted with respiratory distress with chronic COPD and felt to have COPD exacerbation. She has been hypertensive. Troponin slightly elevated at 0.19.  EKG shows subtle T wave change. No chest pain.  My exam shows an obese female in NAD. CV:RRR with no loud murmurs Pulm: coarse BS bilaterally Ext: no edema Labs reviewed by me EKG reviewed by me and shows sinus rhythm with TWI lead 3 Plan: She likely has demand ischemia in the setting of COPD exacerbation and elevated BP with underlying  known CAD. No angina at home. Her abdominal/epigastric pain seems chronic. I would continue with treatment of her COPD and BP at this time. No plans for invasive cardiac workup.   Lauree Chandler 10/08/2017 5:23 PM

## 2017-10-08 NOTE — Progress Notes (Addendum)
Progress Note    [name]  ZOX:[id number] DOB: 1935/05/17  DOA: 10/05/2017 PCP: Leonard Downing, MD    Brief Narrative:   Chief complaint: Follow-up COPD exacerbation  Medical records reviewed and are as summarized below:  [name] is an 82 y.o. female with a PMH of hypertension, hyperlipidemia, COPD, GERD, CAD status post stent, and depression who was admitted 10/05/17 for evaluation of shortness of breath, abdominal pain and chest pain. She was placed on BiPAP on admission due to hypoxia associated with tachypnea. Chest x-ray unrevealing.  Assessment/Plan:   Principal Problem: Acute on chronic respiratory failure with hypoxia due to COPD exacerbation [facility]) Being treated for COPD exacerbation. Initially required BiPAP, currently on nasal cannula. Continue bronchodilators (changed to Xopenex given severe anxiety/tremulousness), Pulmicort, Solu-Medrol, oral azithromycin, Mucinex and incentive spirometry. Follow-up strep pneumonia antigen, blood cultures, sputum culture and respiratory virus panel.   Active problems: Hx of CAD and chest pain/probable demand ischemia No signs of DVT. CXR negative. Mildly elevated cardiac markers with a maximum value of 0.08 noted. 2-D echo showed EF 60-65 percent with no regional wall motion abnormalities and Doppler parameters consistent with elevated left atrial filling pressures. Cholesterol 194, LDL 122. Continue morphine and nitroglycerin when necessary as well as aspirin and Imdur. Has allergy to Lipitor. Suspect troponin elevation due to demand ischemia in the setting of hypoxia. Repeated troponin this morning to ensure trend is downgoing-->tropoinin up.  Will ask cardiology to evaluate. Cycle cardiac markers 3 more sets. Worried that her abdominal pain is anginal equivalent. Will start Nitropaste.  Abdominal pain Etiology is not clear. Lipase is 36.CT of the abdomen/pelvis negative for acute findings. Worried that this might  be an anginal equivalent stop  HTN: Continue home medications: Amlodipine and IV hydralazine prn. Systolic blood pressures have been elevated. Starting Nitropaste.  Depression Stable, no suicidal or homicidal ideations. Continue home medications: Nortriptyline.   Anxiety/tremulousness Likely exacerbated by Solu-Medrol and albuterol. Will place on low dose Xanax QID while on Solu-Medrol. Change albuterol to Xopenex.   Obesity  Body mass index is 34.13 kg/m.   Family Communication/Anticipated D/C date and plan/Code Status   DVT prophylaxis: Lovenox ordered. Code Status: Full Code.  Family Communication: No family at the bedside. Attempted to call daughter, no answer. Disposition Plan: Home when respiratory status improved.   Medical Consultants:    Cardiology   Anti-Infectives:    Azithromycin 10/05/17--->  Subjective:   Patient complains of extreme tremulousness, anxiety, and ongoing shortness of breath/abdominal pain.  Objective:    Vitals:   10/07/17 2032 10/08/17 0040 10/08/17 0100 10/08/17 1211  BP:  (!) 147/69 (!) 159/76 (!) 169/77  Pulse: 94 90 90 87  Resp: 18 18 18 20   Temp:  98.3 F (36.8 C) 98.2 F (36.8 C) 98.1 F (36.7 C)  TempSrc:  Oral Oral Oral  SpO2: 96% 97% 95% 92%  Weight:   90.2 kg (198 lb 13.7 oz)   Height:        Intake/Output Summary (Last 24 hours) at 10/08/2017 1540 Last data filed at 10/08/2017 1448 Gross per 24 hour  Intake 720 ml  Output 2200 ml  Net -1480 ml   Filed Weights   10/06/17 1800 10/07/17 0603 10/08/17 0100  Weight: 89.6 kg (197 lb 9.6 oz) 89.5 kg (197 lb 4.8 oz) 90.2 kg (198 lb 13.7 oz)    Exam: General: Frail elderly female. Extremely anxious and tremulous. Cardiovascular: Heart sounds show a regular rate, and  rhythm. No gallops or rubs. No murmurs. No JVD. Lungs: Expiratory wheezes with fair air movement. Abdomen: Soft, generalized tenderness, nondistended with normal active bowel sounds. No masses. No  hepatosplenomegaly. Skin: Warm and dry. No rashes or lesions. Extremities: No clubbing or cyanosis. 2+ edema. Pedal pulses 2+. Psychiatric: Mood and affect are anxious. Insight and judgment are poor.  Data Reviewed:   I have personally reviewed following labs and imaging studies:  Labs: Labs show the following:   Basic Metabolic Panel: Recent Labs  Lab 10/05/17 1600 10/07/17 0535 10/08/17 0823  NA 139 137 137  K 3.6 4.5 5.1  CL 102 102 100*  CO2 28 29 29   GLUCOSE 96 151* 167*  BUN 13 25* 32*  CREATININE 0.96 1.07* 1.07*  CALCIUM 9.6 9.1 9.5   GFR Estimated Creatinine Clearance: 45.6 mL/min (A) (by C-G formula based on SCr of 1.07 mg/dL (H)). Liver Function Tests: Recent Labs  Lab 10/05/17 1600 10/07/17 0535  AST 19 17  ALT 11* 13*  ALKPHOS 62 50  BILITOT 0.5 0.1*  PROT 6.9 5.6*  ALBUMIN 3.9 3.2*   Recent Labs  Lab 10/05/17 1600  LIPASE 36   CBC: Recent Labs  Lab 10/05/17 1600 10/07/17 0535  WBC 9.1 15.1*  NEUTROABS 6.0  --   HGB 14.2 12.6  HCT 44.2 40.0  MCV 96.7 97.3  PLT 214 215   Cardiac Enzymes: Recent Labs  Lab 10/05/17 2329 10/06/17 0447 10/06/17 1200 10/08/17 0823  TROPONINI <0.03 0.05* 0.08* 0.19*   Hgb A1c: Recent Labs    10/06/17 0447  HGBA1C 6.5*   Lipid Profile: Recent Labs    10/06/17 0448  CHOL 194  HDL 64  LDLCALC 122*  TRIG 38  CHOLHDL 3.0    Microbiology Recent Results (from the past 240 hour(s))  Culture, blood (routine x 2) Call MD if unable to obtain prior to antibiotics being given     Status: None (Preliminary result)   Collection Time: 10/05/17 11:35 PM  Result Value Ref Range Status   Specimen Description BLOOD RIGHT HAND  Final   Special Requests   Final    BOTTLES DRAWN AEROBIC ONLY Blood Culture adequate volume   Culture NO GROWTH 2 DAYS  Final   Report Status PENDING  Incomplete  Culture, blood (routine x 2) Call MD if unable to obtain prior to antibiotics being given     Status: None  (Preliminary result)   Collection Time: 10/05/17 11:45 PM  Result Value Ref Range Status   Specimen Description BLOOD LEFT HAND  Final   Special Requests   Final    BOTTLES DRAWN AEROBIC AND ANAEROBIC Blood Culture adequate volume   Culture NO GROWTH 2 DAYS  Final   Report Status PENDING  Incomplete    Procedures and diagnostic studies:  No results found.  Medications:   . ALPRAZolam  0.25 mg Oral QID  . amLODipine  2.5 mg Oral Daily  . aspirin  325 mg Oral Daily  . azithromycin  250 mg Oral Daily  . budesonide  0.25 mg Nebulization BID  . cholecalciferol  1,000 Units Oral Daily  . docusate sodium  100 mg Oral Q12H  . enoxaparin (LOVENOX) injection  40 mg Subcutaneous Q24H  . guaiFENesin  600 mg Oral BID  . isosorbide mononitrate  30 mg Oral Daily  . levalbuterol  0.63 mg Nebulization QID  . methylPREDNISolone (SOLU-MEDROL) injection  60 mg Intravenous Q8H  . pantoprazole  40 mg Oral Q0600  .  polyethylene glycol  17 g Oral Daily  . psyllium  1 packet Oral BID   Continuous Infusions:   LOS: 3 days   Jacquelynn Cree  [facility] Pager [phone]. If unable to reach me by pager, please call my cell phone at [phone].  *Please refer to [email or web address], password [email or web address] to get updated schedule on who will round on this patient, as hospitalists switch teams weekly. If 7PM-7AM, please contact night-coverage at [email or web address], password [email or web address] for any overnight needs.  10/08/2017, 3:40 PM

## 2017-10-09 LAB — TROPONIN I: TROPONIN I: 0.15 ng/mL — AB (ref <0.03)

## 2017-10-09 MED ORDER — ORAL CARE MOUTH RINSE
15.0000 mL | Freq: Two times a day (BID) | OROMUCOSAL | Status: DC
  Administered 2017-10-09 – 2017-10-11 (×4): 15 mL via OROMUCOSAL

## 2017-10-09 MED ORDER — NORTRIPTYLINE HCL 25 MG PO CAPS
25.0000 mg | ORAL_CAPSULE | Freq: Two times a day (BID) | ORAL | Status: DC
  Filled 2017-10-09: qty 1

## 2017-10-09 MED ORDER — QUETIAPINE FUMARATE 25 MG PO TABS
12.5000 mg | ORAL_TABLET | Freq: Every day | ORAL | Status: DC
  Administered 2017-10-09: 12.5 mg via ORAL
  Filled 2017-10-09: qty 1

## 2017-10-09 MED ORDER — ISOSORBIDE MONONITRATE ER 60 MG PO TB24
60.0000 mg | ORAL_TABLET | Freq: Every day | ORAL | Status: DC
  Administered 2017-10-10 – 2017-10-12 (×3): 60 mg via ORAL
  Filled 2017-10-09 (×3): qty 1

## 2017-10-09 MED ORDER — TRAMADOL HCL 50 MG PO TABS
50.0000 mg | ORAL_TABLET | Freq: Four times a day (QID) | ORAL | Status: DC | PRN
  Administered 2017-10-11: 50 mg via ORAL
  Filled 2017-10-09: qty 1

## 2017-10-09 MED ORDER — AMLODIPINE BESYLATE 10 MG PO TABS
10.0000 mg | ORAL_TABLET | Freq: Every day | ORAL | Status: DC
  Administered 2017-10-10 – 2017-10-12 (×3): 10 mg via ORAL
  Filled 2017-10-09 (×3): qty 1

## 2017-10-09 MED ORDER — METHYLPREDNISOLONE SODIUM SUCC 125 MG IJ SOLR
60.0000 mg | Freq: Three times a day (TID) | INTRAMUSCULAR | Status: AC
  Administered 2017-10-09: 60 mg via INTRAVENOUS
  Filled 2017-10-09: qty 2

## 2017-10-09 MED ORDER — HALOPERIDOL LACTATE 5 MG/ML IJ SOLN
2.0000 mg | Freq: Four times a day (QID) | INTRAMUSCULAR | Status: DC | PRN
  Administered 2017-10-09: 2 mg via INTRAVENOUS
  Filled 2017-10-09: qty 1

## 2017-10-09 MED ORDER — PREDNISONE 20 MG PO TABS
40.0000 mg | ORAL_TABLET | Freq: Two times a day (BID) | ORAL | Status: DC
  Administered 2017-10-10 (×2): 40 mg via ORAL
  Filled 2017-10-09 (×2): qty 2

## 2017-10-09 MED ORDER — ARFORMOTEROL TARTRATE 15 MCG/2ML IN NEBU: 15.0000 ug | INHALATION_SOLUTION | Freq: Two times a day (BID) | RESPIRATORY_TRACT | Status: DC

## 2017-10-09 MED ORDER — LEVALBUTEROL HCL 0.63 MG/3ML IN NEBU
0.6300 mg | INHALATION_SOLUTION | Freq: Three times a day (TID) | RESPIRATORY_TRACT | Status: DC
  Administered 2017-10-09 – 2017-10-10 (×3): 0.63 mg via RESPIRATORY_TRACT
  Filled 2017-10-09 (×4): qty 3

## 2017-10-09 NOTE — Progress Notes (Signed)
Progress Note  Patient Name: [name] Date of Encounter: 10/09/2017  Primary Cardiologist: Hochrein  Subjective   No chest pain. Pt was confused overnight and required haldol to be given.   Inpatient Medications    Scheduled Meds: . ALPRAZolam  0.25 mg Oral QID  . amLODipine  5 mg Oral Daily  . aspirin  325 mg Oral Daily  . azithromycin  250 mg Oral Daily  . budesonide  0.25 mg Nebulization BID  . cholecalciferol  1,000 Units Oral Daily  . docusate sodium  100 mg Oral Q12H  . enoxaparin (LOVENOX) injection  40 mg Subcutaneous Q24H  . guaiFENesin  600 mg Oral BID  . isosorbide mononitrate  30 mg Oral Daily  . levalbuterol  0.63 mg Nebulization QID  . mouth rinse  15 mL Mouth Rinse BID  . methylPREDNISolone (SOLU-MEDROL) injection  60 mg Intravenous Q8H  . nitroGLYCERIN  0.5 inch Topical Q8H  . pantoprazole  40 mg Oral Q0600  . polyethylene glycol  17 g Oral Daily  . psyllium  1 packet Oral BID   Continuous Infusions:  PRN Meds: acetaminophen, albuterol, dicyclomine, haloperidol lactate, hydrALAZINE, morphine injection, nitroGLYCERIN, ondansetron (ZOFRAN) IV, zolpidem   Vital Signs    Vitals:   10/08/17 2047 10/09/17 0006 10/09/17 0051 10/09/17 0500  BP:  (!) 171/79 (!) 161/84 (!) 166/72  Pulse: 94 80 93 80  Resp: 18  20 18   Temp:    98 F (36.7 C)  TempSrc:    Oral  SpO2: 90% 94% 93% 92%  Weight:    201 lb 3.2 oz (91.3 kg)  Height:        Intake/Output Summary (Last 24 hours) at 10/09/2017 0656 Last data filed at 10/09/2017 0600 Gross per 24 hour  Intake 1080 ml  Output 2150 ml  Net -1070 ml   Filed Weights   10/07/17 0603 10/08/17 0100 10/09/17 0500  Weight: 197 lb 4.8 oz (89.5 kg) 198 lb 13.7 oz (90.2 kg) 201 lb 3.2 oz (91.3 kg)    Telemetry    Sinus - Personally Reviewed  ECG    No AM EKG- Personally Reviewed  Physical Exam    General: Well developed, well nourished, NAD, somnolent HEENT: OP clear, mucus membranes moist  SKIN:  warm, dry. No rashes. Neuro: No focal deficits  Musculoskeletal: Muscle strength 5/5 all ext  Psychiatric: somnolent Neck: No JVD, no carotid bruits, no thyromegaly, no lymphadenopathy.  Lungs: Anterior lung fields with poor air movement. No crackles noted.  Cardiovascular: Regular rate and rhythm. No murmurs, gallops or rubs. Abdomen:Soft. Bowel sounds present. Non-tender.  Extremities: No lower extremity edema.     Labs    Chemistry Recent Labs  Lab 10/05/17 1600 10/07/17 0535 10/08/17 0823  NA 139 137 137  K 3.6 4.5 5.1  CL 102 102 100*  CO2 28 29 29   GLUCOSE 96 151* 167*  BUN 13 25* 32*  CREATININE 0.96 1.07* 1.07*  CALCIUM 9.6 9.1 9.5  PROT 6.9 5.6*  --   ALBUMIN 3.9 3.2*  --   AST 19 17  --   ALT 11* 13*  --   ALKPHOS 62 50  --   BILITOT 0.5 0.1*  --   GFRNONAA 54* 48* 48*  GFRAA >60 55* 55*  ANIONGAP 9 6 8      Hematology Recent Labs  Lab 10/05/17 1600 10/07/17 0535  WBC 9.1 15.1*  RBC 4.57 4.11  HGB 14.2 12.6  HCT 44.2 40.0  MCV 96.7 97.3  MCH 31.1 30.7  MCHC 32.1 31.5  RDW 12.7 13.3  PLT 214 215    Cardiac Enzymes Recent Labs  Lab 10/08/17 0823 10/08/17 1553 10/08/17 2151 10/09/17 0325  TROPONINI 0.19* 0.14* 0.14* 0.15*    Recent Labs  Lab 10/05/17 1603  TROPIPOC 0.02     BNPNo results for input(s): BNP, PROBNP in the last 168 hours.   DDimer No results for input(s): DDIMER in the last 168 hours.   Radiology    No results found.  Cardiac Studies   Echo 10/07/17: - Left ventricle: The cavity size was normal. There was moderate   concentric hypertrophy. Systolic function was normal. The   estimated ejection fraction was in the range of 60% to 65%. Wall   motion was normal; there were no regional wall motion   abnormalities. Doppler parameters are consistent with elevated   mean left atrial filling pressure. - Left atrium: The atrium was mildly dilated.  Patient Profile     82 y.o. female with history of CAD with prior  stenting, severe COPD and HTN admitted with COPD exacerbation and uncontrolled HTN. Slight bump in troponin with flat trend.   Assessment & Plan    1. Elevated troponin: Likely due to demand ischemia in setting of COPD exacerbation and elevated BP. Pt is known to have underlying CAD. Troponin trend is flat with peak of 0.19. I do not think an ischemic workup is indicated at this time. BP is elevated.  -Continue to titrate anti-hypertensive medications for better BP control. I increased her Norvasc to 5 mg yesterday.     For questions or updates, please contact [place] Please consult [email or web address] for contact info under Cardiology/STEMI.      Signed, Lauree Chandler, MD  10/09/2017, 6:56 AM

## 2017-10-09 NOTE — Progress Notes (Signed)
RT note-Patient complaining of abdominal discomfort and mid chest pain. RN called EKG performed, Nebulizer treatment given, BBS equal and diminished, RN at bedside.

## 2017-10-09 NOTE — Progress Notes (Signed)
Patient became confused and was trying to leave. Paged the doctor and was given an order for Haldol IV. Tried to call her family but no one was answering.  Drue Flirt, RN

## 2017-10-09 NOTE — Progress Notes (Signed)
Patient states that she is nauseous at this time. RN offer zofran but pt stated that she just wanted something to sip on. Sprite was given. Will update pt's nurse.

## 2017-10-09 NOTE — Progress Notes (Signed)
Patient finally agreed to lay back down after talking with staff and daughter. Daughter stated that she is unable to come and stay for the night with the patient, stated she will come in the morning.  Shahla Betsill, RN

## 2017-10-09 NOTE — Progress Notes (Signed)
Troponin 0.15 this morning. Patient denies chest pain. Informed APP on call. Awaiting on possible orders.  Kennard Fildes, RN

## 2017-10-09 NOTE — Progress Notes (Signed)
[place] TEAM 1 - Stepdown/ICU TEAM  [name]  CVE:[id number] DOB: [birth year] DOA: 10/05/2017 PCP: Leonard Downing, MD     Brief Narrative:  81 y.o. female with a history of HTN, HLD, COPD, GERD, hearing loss, CAD w/ stent placement, and depression who presented with shortness breath for several days, cough, abdominal pain, and chest pain.  ED workup was mostly unremarkable w/ exception to an oxygen sat of 79%.  CXR was w/o acute findings.   Significant Events: 11/18 admit  11/20 TTE - EF 60-65% - no WMA   Subjective: The patient tells me she "feels bad all over."  When specifically asked she denies shortness of breath.  She reports mild diffuse abdominal pain but does not seem clinically bothered by it.  Despite telling me how bad she feels she then asked me if she can go home.  She denies chest pain headache nausea or vomiting.  Assessment & Plan:  Acute on chronic hypoxic resp failure - acute COPD exacerbation  Flu negative - no focal infiltrate on CXR - wheezing resolved - taper steroids - transition toward home inhaled med tx regimen   CAD w/ multiple stents - chest pain - mildly elevated troponin Pain not typical angina - had a negative stress perfusion study in Jan 2016 - no acute changes on admit EKG - TTE notes EF 60-65% w/ no WMA - suspect demand ischemia in setting of hypoxia - Cards has seen and agrees that further ischemic w/u not presently warranted   Acute delirium Likely sundowning - give scheduled low dose seroquel tonight - improved as day went on today   Abdom pain - acute on chronic  Lipase normal - CT abd/pelvis w/o acute findings - exam not revealing - appears to be at her baseline   HTN Has required titration of meds for better control - remains quite elevated - adjust tx again today and follow   HLD  Cont home medical tx   Obesity - Body mass index is 34.54 kg/m.   DVT prophylaxis: lovenox Code Status: FULL CODE Family Communication: no  family present at time of exam  Disposition Plan: d/c home when BP controlled - perhaps 11/23  Consultants:  none  Antimicrobials:  Azithro 11/18 > 11/22  Objective: Blood pressure (!) 194/84, pulse 88, temperature (!) 97.5 F (36.4 C), temperature source Oral, resp. rate 20, height 5\' 4"  (1.626 m), weight 91.3 kg (201 lb 3.2 oz), SpO2 95 %.  Intake/Output Summary (Last 24 hours) at 10/09/2017 1435 Last data filed at 10/09/2017 1402 Gross per 24 hour  Intake 1320 ml  Output 2250 ml  Net -930 ml   Filed Weights   10/07/17 0603 10/08/17 0100 10/09/17 0500  Weight: 89.5 kg (197 lb 4.8 oz) 90.2 kg (198 lb 13.7 oz) 91.3 kg (201 lb 3.2 oz)    Examination: General: No acute respiratory distress Lungs: no wheezing - poor air movement B bases in bed - no focal crackles  Cardiovascular: Regular rate and rhythm without murmur  Abdomen: diffusely mildly tender w/o change, nondistended, soft, bowel sounds positive, no rebound Extremities: no signif edema B LE   CBC: Recent Labs  Lab 10/05/17 1600 10/07/17 0535  WBC 9.1 15.1*  NEUTROABS 6.0  --   HGB 14.2 12.6  HCT 44.2 40.0  MCV 96.7 97.3  PLT 214 025   Basic Metabolic Panel: Recent Labs  Lab 10/05/17 1600 10/07/17 0535 10/08/17 0823  NA 139 137 137  K 3.6 4.5  5.1  CL 102 102 100*  CO2 28 29 29   GLUCOSE 96 151* 167*  BUN 13 25* 32*  CREATININE 0.96 1.07* 1.07*  CALCIUM 9.6 9.1 9.5   GFR: Estimated Creatinine Clearance: 45.9 mL/min (A) (by C-G formula based on SCr of 1.07 mg/dL (H)).  Liver Function Tests: Recent Labs  Lab 10/05/17 1600 10/07/17 0535  AST 19 17  ALT 11* 13*  ALKPHOS 62 50  BILITOT 0.5 0.1*  PROT 6.9 5.6*  ALBUMIN 3.9 3.2*   Recent Labs  Lab 10/05/17 1600  LIPASE 36    Cardiac Enzymes: Recent Labs  Lab 10/06/17 1200 10/08/17 0823 10/08/17 1553 10/08/17 2151 10/09/17 0325  TROPONINI 0.08* 0.19* 0.14* 0.14* 0.15*    HbA1C: Hgb A1c MFr Bld  Date/Time Value Ref Range Status    10/06/2017 04:47 AM 6.5 (H) 4.8 - 5.6 % Final    Comment:    (NOTE) Pre diabetes:          5.7%-6.4% Diabetes:              >6.4% Glycemic control for   <7.0% adults with diabetes   11/29/2014 04:32 AM 6.2 (H) <5.7 % Final    Comment:    (NOTE)                                                                       According to the ADA Clinical Practice Recommendations for 2011, when HbA1c is used as a screening test:  >=6.5%   Diagnostic of Diabetes Mellitus           (if abnormal result is confirmed) 5.7-6.4%   Increased risk of developing Diabetes Mellitus References:Diagnosis and Classification of Diabetes Mellitus,Diabetes TJQZ,0092,33(AQTMA 1):S62-S69 and Standards of Medical Care in         Diabetes - 2011,Diabetes Care,2011,34 (Suppl 1):S11-S61.      Scheduled Meds: . ALPRAZolam  0.25 mg Oral QID  . amLODipine  5 mg Oral Daily  . aspirin  325 mg Oral Daily  . budesonide  0.25 mg Nebulization BID  . cholecalciferol  1,000 Units Oral Daily  . docusate sodium  100 mg Oral Q12H  . enoxaparin (LOVENOX) injection  40 mg Subcutaneous Q24H  . guaiFENesin  600 mg Oral BID  . isosorbide mononitrate  30 mg Oral Daily  . levalbuterol  0.63 mg Nebulization TID  . mouth rinse  15 mL Mouth Rinse BID  . methylPREDNISolone (SOLU-MEDROL) injection  60 mg Intravenous Q8H  . nitroGLYCERIN  0.5 inch Topical Q8H  . pantoprazole  40 mg Oral Q0600  . polyethylene glycol  17 g Oral Daily  . psyllium  1 packet Oral BID     LOS: 4 days   Cherene Altes, MD [facility] Office  [phone] Pager - Text Page per Amion as per below:  On-Call/Text Page:      [email or web address]      password [email or web address]  If 7PM-7AM, please contact night-coverage [email or web address] Password [facility] 10/09/2017, 2:35 PM

## 2017-10-09 NOTE — Progress Notes (Signed)
Patient calm this morning. Without confusion or agitation. Back to her baseline.  States that she does not remember what happened last night. She stated" it must be just a bad dream". Continues to complain of abdominal pain and nausea. Zofran and pain medication given. Will coniine to monitor.  Kaylee Trivett, RN

## 2017-10-09 NOTE — Progress Notes (Signed)
So far IV medication not effective. Patient still agitated and upset, trying to leave and saying that she will call 911. Reach out to daughter [name] and explained situation to her. Currently daughter on the phone with patient.  Will continue to monitor.  Audriana Aldama, RN

## 2017-10-09 NOTE — Progress Notes (Deleted)
Patient become confused and tried to leave. Paged doctor and was given an order for haldol IV. Tried to contact her family but no one was answering.   Drue Flirt, RN

## 2017-10-10 DIAGNOSIS — R748 Abnormal levels of other serum enzymes: Secondary | ICD-10-CM

## 2017-10-10 LAB — BASIC METABOLIC PANEL
ANION GAP: 5 (ref 5–15)
BUN: 34 mg/dL — ABNORMAL HIGH (ref 6–20)
CALCIUM: 8.6 mg/dL — AB (ref 8.9–10.3)
CO2: 33 mmol/L — ABNORMAL HIGH (ref 22–32)
Chloride: 96 mmol/L — ABNORMAL LOW (ref 101–111)
Creatinine, Ser: 1.16 mg/dL — ABNORMAL HIGH (ref 0.44–1.00)
GFR, EST AFRICAN AMERICAN: 50 mL/min — AB (ref >60)
GFR, EST NON AFRICAN AMERICAN: 43 mL/min — AB (ref >60)
GLUCOSE: 237 mg/dL — AB (ref 65–99)
POTASSIUM: 5.1 mmol/L (ref 3.5–5.1)
Sodium: 134 mmol/L — ABNORMAL LOW (ref 135–145)

## 2017-10-10 MED ORDER — ARFORMOTEROL TARTRATE 15 MCG/2ML IN NEBU
15.0000 ug | INHALATION_SOLUTION | Freq: Two times a day (BID) | RESPIRATORY_TRACT | Status: DC
  Administered 2017-10-11 – 2017-10-12 (×3): 15 ug via RESPIRATORY_TRACT
  Filled 2017-10-10 (×3): qty 2

## 2017-10-10 NOTE — Progress Notes (Addendum)
[place] TEAM 1 - Stepdown/ICU TEAM  [name]  KYH:[id number] DOB: Dec 26, 1934 DOA: 10/05/2017 PCP: Leonard Downing, MD     Brief Narrative:  82 y.o. female with a history of HTN, HLD, COPD, GERD, hearing loss, CAD w/ stent placement, and depression who presented with shortness breath for several days, cough, abdominal pain, and chest pain.  ED workup was mostly unremarkable w/ exception to an oxygen sat of 79%.  CXR was w/o acute findings.   Significant Events: 11/18 admit  11/20 TTE - EF 60-65% - no WMA   Subjective: Up in a bedside chair asleep.  Wakes up briefly but tells me she is having trouble staying awake.  Denies cp, n/v, or abdom pain.    Assessment & Plan:  Acute on chronic hypoxic resp failure - acute COPD exacerbation  Flu negative - no focal infiltrate on CXR - wheezing resolved - cont to taper steroids - transitioned to home inhaled med tx regimen   CAD w/ multiple stents - chest pain - mildly elevated troponin Pain not typical angina - had a negative stress perfusion study in Jan 2016 - no acute changes on admit EKG - TTE notes EF 60-65% w/ no WMA - suspect demand ischemia in setting of hypoxia - Cards has seen and agrees that further ischemic w/u not presently warranted   Acute delirium - lethargy  Sundowning as well as possible delirium related to steroid dosing - now lethargic due to one low dose of Seroquel given last night + scheduled xanax added 11/21 - stop seroquel and xanax (not on long term) - follow  Abdom pain - acute on chronic  Lipase normal - CT abd/pelvis w/o acute findings - exam not revealing - appears to be at her baseline   HTN BP still not at goal - adust tx further and follow   HLD  Cont home medical tx   Obesity - Body mass index is 34.88 kg/m.   DVT prophylaxis: lovenox Code Status: FULL CODE Family Communication: no family present at time of exam  Disposition Plan: ambulate - follow mental status - home when alert and  moving about freely   Consultants:  none  Antimicrobials:  Azithro 11/18 > 11/22  Objective: Blood pressure (!) 162/90, pulse 77, temperature (!) 97.5 F (36.4 C), temperature source Oral, resp. rate 18, height 5\' 4"  (1.626 m), weight 92.2 kg (203 lb 3.2 oz), SpO2 94 %.  Intake/Output Summary (Last 24 hours) at 10/10/2017 1152 Last data filed at 10/10/2017 0900 Gross per 24 hour  Intake 720 ml  Output 2100 ml  Net -1380 ml   Filed Weights   10/08/17 0100 10/09/17 0500 10/10/17 0552  Weight: 90.2 kg (198 lb 13.7 oz) 91.3 kg (201 lb 3.2 oz) 92.2 kg (203 lb 3.2 oz)    Examination: General: No acute respiratory distress - somnolent  Lungs: no wheezing - poor air movement B bases  Cardiovascular: RRR Abdomen: obese, nondistended, soft, bowel sounds positive, no rebound Extremities: no signif edema B LE   CBC: Recent Labs  Lab 10/05/17 1600 10/07/17 0535  WBC 9.1 15.1*  NEUTROABS 6.0  --   HGB 14.2 12.6  HCT 44.2 40.0  MCV 96.7 97.3  PLT 214 151   Basic Metabolic Panel: Recent Labs  Lab 10/05/17 1600 10/07/17 0535 10/08/17 0823 10/10/17 0436  NA 139 137 137 134*  K 3.6 4.5 5.1 5.1  CL 102 102 100* 96*  CO2 28 29 29  33*  GLUCOSE  96 151* 167* 237*  BUN 13 25* 32* 34*  CREATININE 0.96 1.07* 1.07* 1.16*  CALCIUM 9.6 9.1 9.5 8.6*   GFR: Estimated Creatinine Clearance: 42.6 mL/min (A) (by C-G formula based on SCr of 1.16 mg/dL (H)).  Liver Function Tests: Recent Labs  Lab 10/05/17 1600 10/07/17 0535  AST 19 17  ALT 11* 13*  ALKPHOS 62 50  BILITOT 0.5 0.1*  PROT 6.9 5.6*  ALBUMIN 3.9 3.2*   Recent Labs  Lab 10/05/17 1600  LIPASE 36    Cardiac Enzymes: Recent Labs  Lab 10/06/17 1200 10/08/17 0823 10/08/17 1553 10/08/17 2151 10/09/17 0325  TROPONINI 0.08* 0.19* 0.14* 0.14* 0.15*    HbA1C: Hgb A1c MFr Bld  Date/Time Value Ref Range Status  10/06/2017 04:47 AM 6.5 (H) 4.8 - 5.6 % Final    Comment:    (NOTE) Pre diabetes:           5.7%-6.4% Diabetes:              >6.4% Glycemic control for   <7.0% adults with diabetes   11/29/2014 04:32 AM 6.2 (H) <5.7 % Final    Comment:    (NOTE)                                                                       According to the ADA Clinical Practice Recommendations for 2011, when HbA1c is used as a screening test:  >=6.5%   Diagnostic of Diabetes Mellitus           (if abnormal result is confirmed) 5.7-6.4%   Increased risk of developing Diabetes Mellitus References:Diagnosis and Classification of Diabetes Mellitus,Diabetes JYNW,2956,21(HYQMV 1):S62-S69 and Standards of Medical Care in         Diabetes - 2011,Diabetes Care,2011,34 (Suppl 1):S11-S61.      Scheduled Meds: . ALPRAZolam  0.25 mg Oral QID  . amLODipine  10 mg Oral Daily  . aspirin  325 mg Oral Daily  . budesonide  0.25 mg Nebulization BID  . cholecalciferol  1,000 Units Oral Daily  . docusate sodium  100 mg Oral Q12H  . enoxaparin (LOVENOX) injection  40 mg Subcutaneous Q24H  . guaiFENesin  600 mg Oral BID  . isosorbide mononitrate  60 mg Oral Daily  . levalbuterol  0.63 mg Nebulization TID  . mouth rinse  15 mL Mouth Rinse BID  . pantoprazole  40 mg Oral Q0600  . polyethylene glycol  17 g Oral Daily  . predniSONE  40 mg Oral BID WC  . psyllium  1 packet Oral BID  . QUEtiapine  12.5 mg Oral QHS     LOS: 5 days   Cherene Altes, MD [facility] Office  [phone] Pager - Text Page per Amion as per below:  On-Call/Text Page:      [email or web address]      password [email or web address]  If 7PM-7AM, please contact night-coverage [email or web address] Password [facility] 10/10/2017, 11:52 AM

## 2017-10-10 NOTE — Progress Notes (Signed)
Progress Note  Patient Name: [name] Date of Encounter: 10/10/2017  Primary Cardiologist: Dr. Percival Spanish  Subjective   Difficult to arouse, eventually did wake up, mildly confused but able to recall the year and location. Had a little cough after being fed orange juice by tech. She did mention some tightness in the throat area, unable to tell me how long it has been going on or if it is worse with swallowing.   Inpatient Medications    Scheduled Meds: . ALPRAZolam  0.25 mg Oral QID  . amLODipine  10 mg Oral Daily  . aspirin  325 mg Oral Daily  . budesonide  0.25 mg Nebulization BID  . cholecalciferol  1,000 Units Oral Daily  . docusate sodium  100 mg Oral Q12H  . enoxaparin (LOVENOX) injection  40 mg Subcutaneous Q24H  . guaiFENesin  600 mg Oral BID  . isosorbide mononitrate  60 mg Oral Daily  . levalbuterol  0.63 mg Nebulization TID  . mouth rinse  15 mL Mouth Rinse BID  . pantoprazole  40 mg Oral Q0600  . polyethylene glycol  17 g Oral Daily  . predniSONE  40 mg Oral BID WC  . psyllium  1 packet Oral BID  . QUEtiapine  12.5 mg Oral QHS   Continuous Infusions:  PRN Meds: acetaminophen, albuterol, dicyclomine, haloperidol lactate, hydrALAZINE, morphine injection, nitroGLYCERIN, ondansetron (ZOFRAN) IV, traMADol, zolpidem   Vital Signs    Vitals:   10/09/17 1953 10/09/17 2011 10/10/17 0552 10/10/17 0749  BP: (!) 169/80  (!) 162/90   Pulse: 80 78 77   Resp: 20 18 18    Temp: 97.6 F (36.4 C)  (!) 97.5 F (36.4 C)   TempSrc: Oral  Oral   SpO2: 98% 94% 96% 94%  Weight:   203 lb 3.2 oz (92.2 kg)   Height:        Intake/Output Summary (Last 24 hours) at 10/10/2017 0827 Last data filed at 10/10/2017 0240 Gross per 24 hour  Intake 720 ml  Output 1900 ml  Net -1180 ml   Filed Weights   10/08/17 0100 10/09/17 0500 10/10/17 0552  Weight: 198 lb 13.7 oz (90.2 kg) 201 lb 3.2 oz (91.3 kg) 203 lb 3.2 oz (92.2 kg)    Telemetry    NSR without significant  ventricular ectopy - Personally Reviewed  ECG    NSR with incomplete RBBB - Personally Reviewed  Physical Exam   GEN: No acute distress.   Neck: No JVD Cardiac: RRR, no murmurs, rubs, or gallops.  Respiratory: Clear to auscultation bilaterally, mild wheezing. GI: Soft, nontender, non-distended  MS: No edema; No deformity. Neuro:  Nonfocal  Psych: Normal affect   Labs    Chemistry Recent Labs  Lab 10/05/17 1600 10/07/17 0535 10/08/17 0823 10/10/17 0436  NA 139 137 137 134*  K 3.6 4.5 5.1 5.1  CL 102 102 100* 96*  CO2 28 29 29  33*  GLUCOSE 96 151* 167* 237*  BUN 13 25* 32* 34*  CREATININE 0.96 1.07* 1.07* 1.16*  CALCIUM 9.6 9.1 9.5 8.6*  PROT 6.9 5.6*  --   --   ALBUMIN 3.9 3.2*  --   --   AST 19 17  --   --   ALT 11* 13*  --   --   ALKPHOS 62 50  --   --   BILITOT 0.5 0.1*  --   --   GFRNONAA 54* 48* 48* 43*  GFRAA >60 55* 55* 50*  ANIONGAP 9 6 8 5      Hematology Recent Labs  Lab 10/05/17 1600 10/07/17 0535  WBC 9.1 15.1*  RBC 4.57 4.11  HGB 14.2 12.6  HCT 44.2 40.0  MCV 96.7 97.3  MCH 31.1 30.7  MCHC 32.1 31.5  RDW 12.7 13.3  PLT 214 215    Cardiac Enzymes Recent Labs  Lab 10/08/17 0823 10/08/17 1553 10/08/17 2151 10/09/17 0325  TROPONINI 0.19* 0.14* 0.14* 0.15*    Recent Labs  Lab 10/05/17 1603  TROPIPOC 0.02     BNPNo results for input(s): BNP, PROBNP in the last 168 hours.   DDimer No results for input(s): DDIMER in the last 168 hours.   Radiology    No results found.  Cardiac Studies   Echo 10/07/2017 LV EF: 60% -   65%  Study Conclusions  - Left ventricle: The cavity size was normal. There was moderate   concentric hypertrophy. Systolic function was normal. The   estimated ejection fraction was in the range of 60% to 65%. Wall   motion was normal; there were no regional wall motion   abnormalities. Doppler parameters are consistent with elevated   mean left atrial filling pressure. - Left atrium: The atrium was  mildly dilated.  Patient Profile     82 y.o. female with PMH of CAD, GERD s/p esophageal dilation, severe COPD on chronic O2, and HTN admitted for COPD exacerbation and uncontrolled HTN. Mildly elevated trop. Echo obtained on 10/07/2017 showed normal EF, no RWMA.   Assessment & Plan    1. Mildly elevated trop: likely demand ischemia in the setting of COPD exacerbation  - no plan for ischemic workup as this time. No further cardiac recommendation. Given her overall mental status and condition, would not be a good candidate for outpatient stress test either. No further cardiac recommendation.   2. HTN: amlodipine increased to 10mg  and imdur increased to 60mg  daily by Internal Medicine this morning.   3. COPD exacerbation on 2 L home O2  4. HLD: intolerant to statins  5. CKD stage III: stable.   6. AMS: hard to arouse this morning, eventually she did wake up and able to tell where she is and the year. She is at risk for aspiration. Significantly deconditioned.   For questions or updates, please contact [place] Please consult [email or web address] for contact info under Cardiology/STEMI.      Hilbert Corrigan, PA  10/10/2017, 8:27 AM    I have personally seen and examined this patient. I agree with the assessment and plan as outlined above. Mild troponin elevation is likely due to demand ischemia. No plans for ischemic evaluation. Continue to titrate BP meds for optimal BP. Cardiology will sign off. Please call with questions.   Lauree Chandler 10/10/2017 11:54 AM

## 2017-10-11 LAB — CULTURE, BLOOD (ROUTINE X 2)
CULTURE: NO GROWTH
Culture: NO GROWTH
SPECIAL REQUESTS: ADEQUATE
Special Requests: ADEQUATE

## 2017-10-11 MED ORDER — FLUTICASONE PROPIONATE 50 MCG/ACT NA SUSP
2.0000 | Freq: Every day | NASAL | Status: DC
  Administered 2017-10-11 – 2017-10-12 (×2): 2 via NASAL
  Filled 2017-10-11: qty 16

## 2017-10-11 MED ORDER — PREDNISONE 20 MG PO TABS
40.0000 mg | ORAL_TABLET | Freq: Two times a day (BID) | ORAL | Status: AC
  Administered 2017-10-11 (×2): 40 mg via ORAL
  Filled 2017-10-11 (×2): qty 2

## 2017-10-11 MED ORDER — PREDNISONE 20 MG PO TABS
20.0000 mg | ORAL_TABLET | Freq: Two times a day (BID) | ORAL | Status: DC
  Administered 2017-10-12: 20 mg via ORAL
  Filled 2017-10-11: qty 1

## 2017-10-11 MED ORDER — MORPHINE SULFATE (PF) 4 MG/ML IV SOLN: 1.0000 mg | INTRAVENOUS | Status: DC | PRN

## 2017-10-11 MED ORDER — CARVEDILOL 3.125 MG PO TABS
3.1250 mg | ORAL_TABLET | Freq: Two times a day (BID) | ORAL | Status: DC
  Administered 2017-10-11 – 2017-10-12 (×2): 3.125 mg via ORAL
  Filled 2017-10-11 (×2): qty 1

## 2017-10-11 NOTE — Progress Notes (Signed)
Physical Therapy Treatment Patient Details Name: [name] MRN: [id number] DOB: 12-Feb-1935 Today's Date: 10/11/2017    History of Present Illness (P) 82 y.o. female admitted on 10/05/17 for chest pain and SOB.  Mildly elevated troponin thought to be demand ischemia.  Pt with significant PMH of HTN, COPD (on 2 L O2 [place] at home), CAD, coronary angioplasty with stent, and lumbar spine surgery.    PT Comments    Mobility limited to in room this session. Pt reports she "just doesn't feel right." Pt positioned in recliner at end of session on 3 L O2.    Follow Up Recommendations  Supervision - Intermittent;Other (comment)(pulmonary rehab)     Equipment Recommendations  Other (comment)(shower chair)    Recommendations for Other Services Other (comment)(mobility tech)     Precautions / Restrictions Precautions Precautions: (P) Fall Precaution Comments: (P) monitor O2 sats and HR Restrictions Weight Bearing Restrictions: (P) No    Mobility  Bed Mobility Overal bed mobility: Modified Independent             General bed mobility comments: +rail, HOB elevated  Transfers Overall transfer level: Needs assistance Equipment used: None Transfers: Stand Pivot Transfers Sit to Stand: Supervision Stand pivot transfers: Supervision       General transfer comment: supervision for safety  Ambulation/Gait Ambulation/Gait assistance: Min guard Ambulation Distance (Feet): 40 Feet Assistive device: None Gait Pattern/deviations: Step-through pattern;Drifts right/left Gait velocity: decreased   General Gait Details: Pt declining hallway ambulation, stating "I just don't feel right." SpO2 90% at rest on 3 L O2. Pt mobilized on 3 L with desat to 85%. 1-minute recovery time to 91% in recliner after ambulation.  Max HR 104 during mobility.    Stairs            Wheelchair Mobility    Modified Rankin (Stroke Patients Only)       Balance   Sitting-balance support: Feet  supported;No upper extremity supported Sitting balance-Leahy Scale: Good     Standing balance support: No upper extremity supported;During functional activity Standing balance-Leahy Scale: Good                              Cognition Arousal/Alertness: (P) Awake/alert Behavior During Therapy: (P) WFL for tasks assessed/performed Overall Cognitive Status: (P) Within Functional Limits for tasks assessed                                 General Comments: HOH      Exercises      General Comments        Pertinent Vitals/Pain Pain Assessment: (P) No/denies pain    Home Living Family/patient expects to be discharged to:: (P) Private residence Living Arrangements: (P) Other relatives(sister) Available Help at Discharge: (P) Family;Available 24 hours/day Type of Home: (P) Mobile home Home Access: (P) Stairs to enter Entrance Stairs-Rails: (P) Right;Left;Can reach both Home Layout: (P) One level Home Equipment: (P) Walker - 4 wheels;Bedside commode;Shower seat(02, pulse ox)      Prior Function Level of Independence: (P) Needs assistance  Gait / Transfers Assistance Needed: (P) per pt she walks household distances only, helps with cooking, sister assists with ADL at times, particularly with LB ADL       PT Goals (current goals can now be found in the care plan section) Acute Rehab PT Goals Patient Stated Goal: to get her breathing  better PT Goal Formulation: With patient Time For Goal Achievement: 10/21/17 Potential to Achieve Goals: Good Progress towards PT goals: Progressing toward goals    Frequency    Min 3X/week      PT Plan Current plan remains appropriate    Co-evaluation              AM-PAC PT "6 Clicks" Daily Activity  Outcome Measure  Difficulty turning over in bed (including adjusting bedclothes, sheets and blankets)?: None Difficulty moving from lying on back to sitting on the side of the bed? : None Difficulty sitting  down on and standing up from a chair with arms (e.g., wheelchair, bedside commode, etc,.)?: None Help needed moving to and from a bed to chair (including a wheelchair)?: A Little Help needed walking in hospital room?: A Little Help needed climbing 3-5 steps with a railing? : A Little 6 Click Score: 21    End of Session Equipment Utilized During Treatment: Gait belt;Oxygen Activity Tolerance: Treatment limited secondary to medical complications (Comment)(desat) Patient left: in chair;with call bell/phone within reach Nurse Communication: Mobility status;Other (comment)(unable to set chair alarm) PT Visit Diagnosis: Muscle weakness (generalized) (M62.81);Other (comment)(decreased activity tolerance)     Time: 5809-9833 PT Time Calculation (min) (ACUTE ONLY): 24 min  Charges:  $Gait Training: 8-22 mins $Therapeutic Activity: 8-22 mins                    G Codes:       [name], PT  Office # [phone] Pager [phone]    [name] 10/11/2017, 11:21 AM

## 2017-10-11 NOTE — Evaluation (Signed)
Occupational Therapy Evaluation and Discharge Patient Details Name: [name] MRN: [id number] DOB: October 17, 1935 Today's Date: 10/11/2017    History of Present Illness 82 y.o. female admitted on 10/05/17 for chest pain and SOB.  Mildly elevated troponin thought to be demand ischemia.  Pt with significant PMH of HTN, COPD (on 2 L O2 [place] at home), CAD, coronary angioplasty with stent, and lumbar spine surgery.   Clinical Impression   Pt is typically sedentary. She furniture walks at home and uses 02. She is assisted for LB bathing and dressing intermittently as she has a bad back. Pt presents with decreased activity tolerance and requires supervision for safety with ADL. Educated in energy conservation strategies and use of AE. Pt verbalized and returned understanding of education. She is declining further therapy at home.    Follow Up Recommendations  No OT follow up    Equipment Recommendations  None recommended by OT    Recommendations for Other Services       Precautions / Restrictions Precautions Precautions: Fall Precaution Comments: monitor O2 sats and HR Restrictions Weight Bearing Restrictions: No      Mobility Bed Mobility Overal bed mobility: Modified Independent             General bed mobility comments: +rail, HOB elevated  Transfers Overall transfer level: Needs assistance Equipment used: None Transfers: Sit to/from Stand Sit to Stand: Supervision        General transfer comment: supervision for safety    Balance   Sitting-balance support: Feet supported;No upper extremity supported Sitting balance-Leahy Scale: Good     Standing balance support: No upper extremity supported;During functional activity Standing balance-Leahy Scale: Good                             ADL either performed or assessed with clinical judgement   ADL Overall ADL's : Needs assistance/impaired Eating/Feeding: Independent;Sitting   Grooming: Wash/dry  hands;Standing;Supervision/safety   Upper Body Bathing: Set up;Sitting   Lower Body Bathing: Supervison/ safety;Sit to/from stand;With adaptive equipment   Upper Body Dressing : Set up;Sitting   Lower Body Dressing: Supervision/safety;Sit to/from stand;With adaptive equipment   Toilet Transfer: Supervision/safety;Ambulation   Toileting- Clothing Manipulation and Hygiene: Supervision/safety;Sit to/from stand       Functional mobility during ADLs: Supervision/safety General ADL Comments: educated in energy conservation strategies and use of AE     Vision Patient Visual Report: No change from baseline       Perception     Praxis      Pertinent Vitals/Pain Pain Assessment: No/denies pain     Hand Dominance Right   Extremity/Trunk Assessment Upper Extremity Assessment Upper Extremity Assessment: Overall WFL for tasks assessed   Lower Extremity Assessment Lower Extremity Assessment: Defer to PT evaluation   Cervical / Trunk Assessment Cervical / Trunk Assessment: Other exceptions Cervical / Trunk Exceptions: h/o lumbar spine surgery   Communication Communication Communication: HOH   Cognition Arousal/Alertness: Awake/alert Behavior During Therapy: WFL for tasks assessed/performed Overall Cognitive Status: Within Functional Limits for tasks assessed                                 General Comments: HOH   General Comments       Exercises     Shoulder Instructions      Home Living Family/patient expects to be discharged to:: Private residence Living Arrangements: Other relatives(sister)  Available Help at Discharge: Family;Available 24 hours/day Type of Home: Mobile home Home Access: Stairs to enter Entrance Stairs-Number of Steps: 5 Entrance Stairs-Rails: Right;Left;Can reach both Home Layout: One level     Bathroom Shower/Tub: Teacher, early years/pre: Standard     Home Equipment: Environmental consultant - 4 wheels;Bedside commode;Shower  seat(02, pulse ox)          Prior Functioning/Environment Level of Independence: Needs assistance  Gait / Transfers Assistance Needed: per pt she walks household distances only, helps with cooking, sister assists with ADL at times, particularly with LB ADL              OT Problem List: Decreased activity tolerance      OT Treatment/Interventions:      OT Goals(Current goals can be found in the care plan section) Acute Rehab OT Goals Patient Stated Goal: to get her breathing better  OT Frequency:     Barriers to D/C:            Co-evaluation              AM-PAC PT "6 Clicks" Daily Activity     Outcome Measure Help from another person eating meals?: None Help from another person taking care of personal grooming?: A Little Help from another person toileting, which includes using toliet, bedpan, or urinal?: A Little Help from another person bathing (including washing, rinsing, drying)?: A Little Help from another person to put on and taking off regular upper body clothing?: None Help from another person to put on and taking off regular lower body clothing?: A Little 6 Click Score: 20   End of Session Equipment Utilized During Treatment: Gait belt;Oxygen  Activity Tolerance: Patient tolerated treatment well Patient left: in chair;with call bell/phone within reach  OT Visit Diagnosis: Other (comment)(decreaed activity tolerance)                Time: 1497-0263 OT Time Calculation (min): 31 min Charges:  OT General Charges $OT Visit: 1 Visit OT Evaluation $OT Eval Moderate Complexity: 1 Mod OT Treatments $Self Care/Home Management : 8-22 mins G-Codes:     2017/10/21 Nestor Lewandowsky, OTR/L Pager: [phone]  Werner Lean, Haze [place] October 21, 2017, 11:22 AM

## 2017-10-11 NOTE — Progress Notes (Signed)
[facility] TEAM 1 - Stepdown/ICU TEAM  [name]  PRF:[id number] DOB: [birth year] DOA: 10/05/2017 PCP: Leonard Downing, MD     Brief Narrative:  82 y.o. female with a history of HTN, HLD, COPD, GERD, hearing loss, CAD w/ stent placement, and depression who presented with shortness breath for several days, cough, abdominal pain, and chest pain.  ED workup was mostly unremarkable w/ exception to an oxygen sat of 79%.  CXR was w/o acute findings.   Significant Events: 11/18 admit  11/20 TTE - EF 60-65% - no WMA   Subjective: Feeling somewhat better, but c/o signif nasal congestion and difficulty breathing as a result.  She denies cp, n/v, or abdom pain.    Assessment & Plan:  Acute on chronic hypoxic resp failure - acute COPD exacerbation  Flu negative - no focal infiltrate on CXR - wheezing resolved - cont to taper steroids - transitioned to home inhaled med tx regimen - add flonase for nasal congestion   CAD w/ multiple stents - chest pain - mildly elevated troponin Pain not typical angina - had a negative stress perfusion study in Jan 2016 - no acute changes on admit EKG - TTE notes EF 60-65% w/ no WMA - suspect demand ischemia in setting of hypoxia - Cards has seen and agrees that further ischemic w/u not presently warranted   Acute delirium - lethargy  Sundowning as well as possible delirium related to steroid dosing - MS has improved since we stopped seroquel and xanax - at her apparent baseline presently   Abdom pain - acute on chronic  Lipase normal - CT abd/pelvis w/o acute findings - exam not revealing - appears to be at her baseline   HTN Remains elevated - adjust meds again today and follow   HLD  Cont home medical tx   Obesity - Body mass index is 34.74 kg/m.   DVT prophylaxis: lovenox Code Status: FULL CODE Family Communication: no family present at time of exam  Disposition Plan: ambulate - follow mental status - plan for d/c home 11/25    Consultants:  none  Antimicrobials:  Azithro 11/18 > 11/22  Objective: Blood pressure (!) 168/46, pulse 85, temperature 98.2 F (36.8 C), temperature source Oral, resp. rate 20, height 5\' 4"  (1.626 m), weight 91.8 kg (202 lb 6.4 oz), SpO2 91 %.  Intake/Output Summary (Last 24 hours) at 10/11/2017 1330 Last data filed at 10/11/2017 1311 Gross per 24 hour  Intake 1367 ml  Output 3600 ml  Net -2233 ml   Filed Weights   10/09/17 0500 10/10/17 0552 10/11/17 0344  Weight: 91.3 kg (201 lb 3.2 oz) 92.2 kg (203 lb 3.2 oz) 91.8 kg (202 lb 6.4 oz)    Examination: General: No acute respiratory distress - alert and conversant Lungs: no wheezing - CTA th/o - no crackles  Cardiovascular: RRR w/o M  Abdomen: obese, nondistended, soft, bowel sounds positive, no rebound Extremities: no edema B LE   CBC: Recent Labs  Lab 10/05/17 1600 10/07/17 0535  WBC 9.1 15.1*  NEUTROABS 6.0  --   HGB 14.2 12.6  HCT 44.2 40.0  MCV 96.7 97.3  PLT 214 935   Basic Metabolic Panel: Recent Labs  Lab 10/05/17 1600 10/07/17 0535 10/08/17 0823 10/10/17 0436  NA 139 137 137 134*  K 3.6 4.5 5.1 5.1  CL 102 102 100* 96*  CO2 28 29 29  33*  GLUCOSE 96 151* 167* 237*  BUN 13 25* 32* 34*  CREATININE  0.96 1.07* 1.07* 1.16*  CALCIUM 9.6 9.1 9.5 8.6*   GFR: Estimated Creatinine Clearance: 42.4 mL/min (A) (by C-G formula based on SCr of 1.16 mg/dL (H)).  Liver Function Tests: Recent Labs  Lab 10/05/17 1600 10/07/17 0535  AST 19 17  ALT 11* 13*  ALKPHOS 62 50  BILITOT 0.5 0.1*  PROT 6.9 5.6*  ALBUMIN 3.9 3.2*   Recent Labs  Lab 10/05/17 1600  LIPASE 36    Cardiac Enzymes: Recent Labs  Lab 10/06/17 1200 10/08/17 0823 10/08/17 1553 10/08/17 2151 10/09/17 0325  TROPONINI 0.08* 0.19* 0.14* 0.14* 0.15*    HbA1C: Hgb A1c MFr Bld  Date/Time Value Ref Range Status  10/06/2017 04:47 AM 6.5 (H) 4.8 - 5.6 % Final    Comment:    (NOTE) Pre diabetes:          5.7%-6.4% Diabetes:               >6.4% Glycemic control for   <7.0% adults with diabetes   11/29/2014 04:32 AM 6.2 (H) <5.7 % Final    Comment:    (NOTE)                                                                       According to the ADA Clinical Practice Recommendations for 2011, when HbA1c is used as a screening test:  >=6.5%   Diagnostic of Diabetes Mellitus           (if abnormal result is confirmed) 5.7-6.4%   Increased risk of developing Diabetes Mellitus References:Diagnosis and Classification of Diabetes Mellitus,Diabetes [facility],9470,96(GEZMO 1):S62-S69 and Standards of Medical Care in         Diabetes - 2011,Diabetes Care,2011,34 (Suppl 1):S11-S61.      Scheduled Meds: . amLODipine  10 mg Oral Daily  . arformoterol  15 mcg Nebulization BID  . aspirin  325 mg Oral Daily  . budesonide  0.25 mg Nebulization BID  . cholecalciferol  1,000 Units Oral Daily  . docusate sodium  100 mg Oral Q12H  . enoxaparin (LOVENOX) injection  40 mg Subcutaneous Q24H  . fluticasone  2 spray Each Nare Daily  . guaiFENesin  600 mg Oral BID  . isosorbide mononitrate  60 mg Oral Daily  . mouth rinse  15 mL Mouth Rinse BID  . pantoprazole  40 mg Oral Q0600  . polyethylene glycol  17 g Oral Daily  . predniSONE  40 mg Oral BID WC  . psyllium  1 packet Oral BID     LOS: 6 days   Cherene Altes, MD [facility] Office  [phone] Pager - Text Page per Amion as per below:  On-Call/Text Page:      [email or web address]      password [email or web address]  If 7PM-7AM, please contact night-coverage [email or web address] Password [email or web address] 10/11/2017, 1:30 PM

## 2017-10-12 LAB — BASIC METABOLIC PANEL
ANION GAP: 10 (ref 5–15)
BUN: 32 mg/dL — ABNORMAL HIGH (ref 6–20)
CHLORIDE: 93 mmol/L — AB (ref 101–111)
CO2: 31 mmol/L (ref 22–32)
Calcium: 9 mg/dL (ref 8.9–10.3)
Creatinine, Ser: 1 mg/dL (ref 0.44–1.00)
GFR calc Af Amer: >60 mL/min (ref >60)
GFR calc non Af Amer: 52 mL/min — ABNORMAL LOW (ref >60)
GLUCOSE: 168 mg/dL — AB (ref 65–99)
POTASSIUM: 4.3 mmol/L (ref 3.5–5.1)
Sodium: 134 mmol/L — ABNORMAL LOW (ref 135–145)

## 2017-10-12 MED ORDER — PREDNISONE 10 MG PO TABS: ORAL_TABLET | ORAL | 0 refills | Status: DC

## 2017-10-12 MED ORDER — AMLODIPINE BESYLATE 10 MG PO TABS: 10.0000 mg | ORAL_TABLET | Freq: Every day | ORAL | 0 refills | Status: DC

## 2017-10-12 MED ORDER — ASPIRIN 325 MG PO TABS: 325.0000 mg | ORAL_TABLET | Freq: Every day | ORAL | 0 refills | Status: DC

## 2017-10-12 MED ORDER — FLUTICASONE PROPIONATE 50 MCG/ACT NA SUSP: 2.0000 | Freq: Every day | NASAL | 0 refills | Status: DC | PRN

## 2017-10-12 MED ORDER — ARFORMOTEROL TARTRATE 15 MCG/2ML IN NEBU: 15.0000 ug | INHALATION_SOLUTION | Freq: Two times a day (BID) | RESPIRATORY_TRACT | 0 refills | Status: DC

## 2017-10-12 MED ORDER — CARVEDILOL 3.125 MG PO TABS: 3.1250 mg | ORAL_TABLET | Freq: Two times a day (BID) | ORAL | 0 refills | Status: DC

## 2017-10-12 MED ORDER — FLUTICASONE PROPIONATE 50 MCG/ACT NA SUSP: 2.0000 | Freq: Every day | NASAL | 0 refills | Status: AC | PRN

## 2017-10-12 MED ORDER — ASPIRIN 81 MG PO TABS: 81.0000 mg | ORAL_TABLET | Freq: Every day | ORAL | Status: DC

## 2017-10-12 NOTE — Care Management Note (Deleted)
Case Management Note  Patient Details  Name: KYNA BLAHNIK MRN: [id number] Date of Birth: 09/26/1935  Subjective/Objective:                    Action/Plan:   Expected Discharge Date:  10/12/17               Expected Discharge Plan:  [place]  In-House Referral:  NA  Discharge planning Services  CM Consult  Post Acute Care Choice:  Home Health Choice offered to:  Patient  DME Arranged:    DME Agency:  [facility]  HH Arranged:  RN [facility] Agency:  [place]  Status of Service:  Completed, signed off  If discussed at [place] of Stay Meetings, dates discussed:    Additional Comments:  Delrae Sawyers, RN 10/12/2017, 12:00 PM

## 2017-10-12 NOTE — Progress Notes (Signed)
Discharge instructions given to patient and family and all questions answered.   

## 2017-10-12 NOTE — Discharge Instructions (Signed)

## 2017-10-12 NOTE — Care Management Note (Addendum)
Case Management Note  Patient Details  Name: [name] MRN: [id number] Date of Birth: Feb 08, 1935  Subjective/Objective: 82 y/o F to be discharged home with instructions to f/u with PCP for B/P assessment.   RN called back to ask if pt could have RN for med assist as her asister assists with medications and "does not read" I will alert [facility] . Edwina accepted referral.                 Action/Plan: CM will sign off for now but will be available should additional discharge needs arise or disposition change.    Expected Discharge Date:  10/12/17               Expected Discharge Plan:  [place]  In-House Referral:  NA  Discharge planning Services  CM Consult  Post Acute Care Choice:  Home Health Choice offered to:     DME Arranged:    DME Agency:     HH Arranged:    [place]:  [employer]  Status of Service:  Completed, signed off  If discussed at [place] of Stay Meetings, dates discussed:    Additional Comments:  Delrae Sawyers, RN 10/12/2017, 10:45 AM

## 2017-10-12 NOTE — Plan of Care (Signed)
  Progressing Pain Managment: General experience of comfort will improve 10/12/2017 0203 - Progressing by Ruben Im, RN   Completed/Met Elimination: Will not experience complications related to bowel motility 10/12/2017 0203 - Completed/Met by Ruben Im, RN Will not experience complications related to urinary retention 10/12/2017 0203 - Completed/Met by Ruben Im, RN

## 2017-10-12 NOTE — Discharge Summary (Signed)
DISCHARGE SUMMARY  [name]  MR#: [id number]  DOB:Nov 06, 1935  Date of Admission: 10/05/2017 Date of Discharge: 10/12/2017  Attending Physician:Ernesha Ramone T  Patient's FXT:[id number], [name] Jews, MD  Consults:  Disposition: D/C home  Follow-up Appts: Follow-up Information    Leonard Downing, MD. Schedule an appointment as soon as possible for a visit in 5 day(s).   Specialty:  Family Medicine Contact information: [place] [address] [phone]           Tests Needing Follow-up: -assessment of BP is indicated  Discharge Diagnoses: Acute on chronic hypoxic resp failure - acute COPD exacerbation  CAD w/ multiple stents - chest pain - mildly elevated troponin Acute delirium - lethargy  Abdom pain - acute on chronic  HTN HLD  Obesity - Body mass index is 34.74 kg/m.  Initial presentation: 82 y.o.femalewith a history ofHTN, HLD, COPD, GERD, hearing loss, CAD w/ stent placement, and depression who presented with shortness breath for several days, cough, abdominal pain, and chest pain.  ED workup was mostly unremarkable w/ exception to an oxygen sat of 79%.  CXR was w/o acute findings.   Hospital Course:  Acute on chronic hypoxic resp failure - acute COPD exacerbation  Flu negative - no focal infiltrate on CXR - wheezing resolved - cont to taper steroids as outpt- transitioned to home inhaled med tx regimen - added flonase for nasal congestion - stable on exam at time of d/c w/ no wheezing or resp distress  CAD w/ multiple stents - chest pain - mildly elevated troponin Pain not typical angina - had a negative stress perfusion studyin Jan 2016 - no acute changes on admit EKG - TTE notes EF 60-65% w/ no WMA - suspect demand ischemia in setting of hypoxia - Cards evaluated and agreed that further ischemic w/u not presently warranted   Acute delirium - lethargy  Sundowning as well as possible delirium related to steroid  dosing - MS improved after we stopped seroquel and xanax - at her apparent baseline at time of d/c   Abdom pain - acute on chronic  Lipase normal - CT abd/pelvis w/o acute findings - exam not revealing - appears to be at her baseline - tolerating a diet w/o difficulty   HTN Remains elevated - meds have been adjusted during this admit - suspect an element of "white coat HTN" at play - explained to pt that visit to her PCP this week will be indicated   HLD  Cont home medical tx   Obesity - Body mass index is 34.74 kg/m.    Allergies as of 10/12/2017      Reactions   Iohexol Other (See Comments)   Code:  HIVES, Desc:  PER ROBIN @ [facility], PT IS ALLERGIC TO IVP DYE 08/28/10 RM   Lipitor [atorvastatin Calcium] Hives      Medication List    STOP taking these medications   nortriptyline 25 MG capsule Commonly known as:  PAMELOR   polyethylene glycol packet Commonly known as:  MIRALAX / GLYCOLAX   traMADol 50 MG tablet Commonly known as:  ULTRAM     TAKE these medications   albuterol 108 (90 Base) MCG/ACT inhaler Commonly known as:  PROVENTIL HFA;VENTOLIN HFA Inhale 2 puffs into the lungs every 4 (four) hours as needed for wheezing.   amLODipine 10 MG tablet Commonly known as:  NORVASC Take 1 tablet (10 mg total) by mouth daily. Start taking on:  10/13/2017 What changed:  medication strength  how much to take   arformoterol 15 MCG/2ML Nebu Commonly known as:  BROVANA Take 2 mLs (15 mcg total) by nebulization 2 (two) times daily.   aspirin 81 MG tablet Take 1 tablet (81 mg total) by mouth daily. Start taking on:  10/13/2017   budesonide 0.25 MG/2ML nebulizer solution Commonly known as:  PULMICORT Take 2 mLs (0.25 mg total) by nebulization 2 (two) times daily.   carvedilol 3.125 MG tablet Commonly known as:  COREG Take 1 tablet (3.125 mg total) by mouth 2 (two) times daily with a meal.   cholecalciferol 1000 units tablet Commonly known as:  VITAMIN D Take  1,000 Units by mouth daily.   dicyclomine 20 MG tablet Commonly known as:  BENTYL Take 1 tablet (20 mg total) by mouth 3 (three) times daily as needed for spasms. Cramping abdominal pain.   docusate sodium 100 MG capsule Commonly known as:  COLACE Take 1 capsule (100 mg total) by mouth every 12 (twelve) hours.   fluticasone 50 MCG/ACT nasal spray Commonly known as:  FLONASE Place 2 sprays into both nostrils daily as needed for allergies or rhinitis.   guaiFENesin 600 MG 12 hr tablet Commonly known as:  MUCINEX Take 1 tablet (600 mg total) by mouth 2 (two) times daily.   isosorbide mononitrate 30 MG 24 hr tablet Commonly known as:  IMDUR Take 1 tablet (30 mg total) by mouth daily.   nitroGLYCERIN 0.4 MG SL tablet Commonly known as:  NITROSTAT Place 0.4 mg under the tongue every 5 (five) minutes as needed for chest pain.   predniSONE 10 MG tablet Commonly known as:  DELTASONE Take 2 tablets 2x a day on 11/25 and 11/26, then take 2 tablets a day on 11/27 and 11/28, then take 1 tablet a day on 11/29 and 11/30, then stop taking   psyllium 28 % packet Commonly known as:  METAMUCIL SMOOTH TEXTURE Take 1 packet by mouth 2 (two) times daily.   ranitidine 300 MG tablet Commonly known as:  ZANTAC Take 300 mg by mouth at bedtime.       Day of Discharge BP (!) 160/74 (BP Location: Right Arm)   Pulse 76   Temp 97.8 F (36.6 C) (Oral)   Resp 18   Ht 5\' 4"  (1.626 m)   Wt 89.7 kg (197 lb 12.8 oz)   SpO2 91%   BMI 33.95 kg/m   Physical Exam: General: No acute respiratory distress - alert and oriented but HOD Lungs: Clear to auscultation bilaterally without wheezes or crackles Cardiovascular: Regular rate and rhythm without murmur gallop or rub normal S1 and S2 Abdomen: Nontender, nondistended, soft, bowel sounds positive, no rebound, no ascites, no appreciable mass Extremities: No significant cyanosis, clubbing, or edema bilateral lower extremities  Basic Metabolic  Panel: Recent Labs  Lab 10/05/17 1600 10/07/17 0535 10/08/17 0823 10/10/17 0436 10/12/17 0500  NA 139 137 137 134* 134*  K 3.6 4.5 5.1 5.1 4.3  CL 102 102 100* 96* 93*  CO2 28 29 29  33* 31  GLUCOSE 96 151* 167* 237* 168*  BUN 13 25* 32* 34* 32*  CREATININE 0.96 1.07* 1.07* 1.16* 1.00  CALCIUM 9.6 9.1 9.5 8.6* 9.0    Liver Function Tests: Recent Labs  Lab 10/05/17 1600 10/07/17 0535  AST 19 17  ALT 11* 13*  ALKPHOS 62 50  BILITOT 0.5 0.1*  PROT 6.9 5.6*  ALBUMIN 3.9 3.2*   Recent Labs  Lab 10/05/17 1600  LIPASE 36  CBC: Recent Labs  Lab 10/05/17 1600 10/07/17 0535  WBC 9.1 15.1*  NEUTROABS 6.0  --   HGB 14.2 12.6  HCT 44.2 40.0  MCV 96.7 97.3  PLT 214 215    Cardiac Enzymes: Recent Labs  Lab 10/06/17 1200 10/08/17 0823 10/08/17 1553 10/08/17 2151 10/09/17 0325  TROPONINI 0.08* 0.19* 0.14* 0.14* 0.15*    Recent Results (from the past 240 hour(s))  Culture, blood (routine x 2) Call MD if unable to obtain prior to antibiotics being given     Status: None   Collection Time: 10/05/17 11:35 PM  Result Value Ref Range Status   Specimen Description BLOOD RIGHT HAND  Final   Special Requests   Final    BOTTLES DRAWN AEROBIC ONLY Blood Culture adequate volume   Culture NO GROWTH 5 DAYS  Final   Report Status 10/11/2017 FINAL  Final  Culture, blood (routine x 2) Call MD if unable to obtain prior to antibiotics being given     Status: None   Collection Time: 10/05/17 11:45 PM  Result Value Ref Range Status   Specimen Description BLOOD LEFT HAND  Final   Special Requests   Final    BOTTLES DRAWN AEROBIC AND ANAEROBIC Blood Culture adequate volume   Culture NO GROWTH 5 DAYS  Final   Report Status 10/11/2017 FINAL  Final     Time spent in discharge (includes decision making & examination of pt): 35 minutes  10/12/2017, 10:28 AM   Cherene Altes, MD [facility] Office  [phone] Pager [phone]  On-Call/Text Page:       [email or web address]      password [facility]

## 2017-10-14 ENCOUNTER — Telehealth: Payer: Self-pay | Admitting: Cardiology

## 2017-10-14 NOTE — Telephone Encounter (Signed)
OK 

## 2017-10-14 NOTE — Telephone Encounter (Signed)
New message     Pt was discharged with home health order , pt was seen by physical therapy today , needs order to be seen 2x for 3 weeks and then 1x a week for 2 weeks    Fax [phone]

## 2017-10-14 NOTE — Telephone Encounter (Signed)
Returned call to [name] home health s/w [facility] he states that Dr Arelia Sneddon will not sign home health or physical therapy orders. Will you sign these orders for pt, needs order to be seen 2x for 3 weeks and then 1x a week for 2 weeks?

## 2017-10-15 NOTE — Telephone Encounter (Signed)
Communicated order for home PT as outlined in previous call. Also gave verbal OK for 1x visit next week exercise, energy conservation, ADL planning, meal prep. Don voiced thanks for call back, will fax orders for signature to [facility] fax#.

## 2017-10-15 NOTE — Telephone Encounter (Signed)
New Message  Don from [place] call to f/u on orders for pt. Please call back to discuss

## 2017-11-05 ENCOUNTER — Ambulatory Visit: Payer: Medicare Other | Admitting: Gastroenterology

## 2017-11-07 ENCOUNTER — Emergency Department (HOSPITAL_COMMUNITY)
Admission: EM | Admit: 2017-11-07 | Discharge: 2017-11-07 | Disposition: A | Discharge status: Home / Self Care | Payer: Medicare Other | Attending: Emergency Medicine | Admitting: Emergency Medicine

## 2017-11-07 ENCOUNTER — Encounter (HOSPITAL_COMMUNITY): Payer: Self-pay | Admitting: Emergency Medicine

## 2017-11-07 ENCOUNTER — Emergency Department (HOSPITAL_COMMUNITY): Payer: Medicare Other

## 2017-11-07 ENCOUNTER — Other Ambulatory Visit: Payer: Self-pay

## 2017-11-07 DIAGNOSIS — Z7982 Long term (current) use of aspirin: Secondary | ICD-10-CM | POA: Insufficient documentation

## 2017-11-07 DIAGNOSIS — Z87891 Personal history of nicotine dependence: Secondary | ICD-10-CM | POA: Diagnosis not present

## 2017-11-07 DIAGNOSIS — K5900 Constipation, unspecified: Secondary | ICD-10-CM | POA: Diagnosis not present

## 2017-11-07 DIAGNOSIS — M545 Low back pain: Secondary | ICD-10-CM | POA: Diagnosis present

## 2017-11-07 DIAGNOSIS — I2510 Atherosclerotic heart disease of native coronary artery without angina pectoris: Secondary | ICD-10-CM | POA: Insufficient documentation

## 2017-11-07 DIAGNOSIS — Y939 Activity, unspecified: Secondary | ICD-10-CM | POA: Diagnosis not present

## 2017-11-07 DIAGNOSIS — R109 Unspecified abdominal pain: Secondary | ICD-10-CM | POA: Insufficient documentation

## 2017-11-07 DIAGNOSIS — I10 Essential (primary) hypertension: Secondary | ICD-10-CM | POA: Insufficient documentation

## 2017-11-07 DIAGNOSIS — Z79899 Other long term (current) drug therapy: Secondary | ICD-10-CM | POA: Insufficient documentation

## 2017-11-07 DIAGNOSIS — J449 Chronic obstructive pulmonary disease, unspecified: Secondary | ICD-10-CM | POA: Diagnosis not present

## 2017-11-07 DIAGNOSIS — M7061 Trochanteric bursitis, right hip: Secondary | ICD-10-CM | POA: Diagnosis not present

## 2017-11-07 DIAGNOSIS — Z955 Presence of coronary angioplasty implant and graft: Secondary | ICD-10-CM | POA: Diagnosis not present

## 2017-11-07 LAB — COMPREHENSIVE METABOLIC PANEL
ALK PHOS: 56 U/L (ref 38–126)
ALT: 15 U/L (ref 14–54)
ANION GAP: 8 (ref 5–15)
AST: 18 U/L (ref 15–41)
Albumin: 3.8 g/dL (ref 3.5–5.0)
BILIRUBIN TOTAL: 0.7 mg/dL (ref 0.3–1.2)
BUN: 14 mg/dL (ref 6–20)
CALCIUM: 9.5 mg/dL (ref 8.9–10.3)
CO2: 29 mmol/L (ref 22–32)
CREATININE: 0.93 mg/dL (ref 0.44–1.00)
Chloride: 102 mmol/L (ref 101–111)
GFR calc non Af Amer: 57 mL/min — ABNORMAL LOW (ref >60)
GLUCOSE: 114 mg/dL — AB (ref 65–99)
Potassium: 4.4 mmol/L (ref 3.5–5.1)
Sodium: 139 mmol/L (ref 135–145)
TOTAL PROTEIN: 6.4 g/dL — AB (ref 6.5–8.1)

## 2017-11-07 LAB — CBC
HEMATOCRIT: 41.9 % (ref 36.0–46.0)
HEMOGLOBIN: 13.7 g/dL (ref 12.0–15.0)
MCH: 30.9 pg (ref 26.0–34.0)
MCHC: 32.7 g/dL (ref 30.0–36.0)
MCV: 94.4 fL (ref 78.0–100.0)
Platelets: 266 10*3/uL (ref 150–400)
RBC: 4.44 MIL/uL (ref 3.87–5.11)
RDW: 12.4 % (ref 11.5–15.5)
WBC: 7.8 10*3/uL (ref 4.0–10.5)

## 2017-11-07 LAB — URINALYSIS, ROUTINE W REFLEX MICROSCOPIC
Bilirubin Urine: NEGATIVE
GLUCOSE, UA: NEGATIVE mg/dL
Hgb urine dipstick: NEGATIVE
Ketones, ur: NEGATIVE mg/dL
LEUKOCYTES UA: NEGATIVE
Nitrite: NEGATIVE
PH: 5 (ref 5.0–8.0)
Protein, ur: NEGATIVE mg/dL
SPECIFIC GRAVITY, URINE: 1.011 (ref 1.005–1.030)

## 2017-11-07 LAB — I-STAT TROPONIN, ED: Troponin i, poc: 0 ng/mL (ref 0.00–0.08)

## 2017-11-07 MED ORDER — ONDANSETRON HCL 4 MG/2ML IJ SOLN
4.0000 mg | Freq: Once | INTRAMUSCULAR | Status: AC
  Administered 2017-11-07: 4 mg via INTRAVENOUS
  Filled 2017-11-07: qty 2

## 2017-11-07 MED ORDER — KETOROLAC TROMETHAMINE 60 MG/2ML IM SOLN
30.0000 mg | Freq: Once | INTRAMUSCULAR | Status: AC
  Administered 2017-11-07: 30 mg via INTRAMUSCULAR
  Filled 2017-11-07: qty 2

## 2017-11-07 MED ORDER — POLYETHYLENE GLYCOL 3350 17 GM/SCOOP PO POWD: 1.0000 | Freq: Once | ORAL | 0 refills | Status: AC

## 2017-11-07 NOTE — ED Provider Notes (Signed)
[facility] EMERGENCY DEPARTMENT Provider Note   CSN: [id number] Arrival date & time: 11/07/17  1117     History   Chief Complaint Chief Complaint  Patient presents with  . Back Pain    HPI [name] is a 82 y.o. female.  HPI   She is an 82 year old female with a h/o HTN, HLD, CAD, and COPD (on 2L home O2) who presents to the ED today via [facility] with a chief complaint of back pain. Pain is in her lower back and goes down her right leg.  She denies any recent falls or trauma.  States she hurts all over and feels like she is going to die.   During history taking patient seems more concerned with nausea and generalized abdominal pain. States that she is constipated and her last BM was 2 days ago.  States that she had a BM that looked white and "like fatty meat " recently. Also reports chest pain, and points to her epigastrium to show where the pain is. She denies SOB. Pt states she lives at home with her sister and is independent with ADLs.  Pt denies taking and laxatives or stool softeners at home.  Patient is on 2 L O2 at home. She denies any recent falls.  Past Medical History:  Diagnosis Date  . Arthritis    "aching bones sometimes"  . Blurred vision   . CAD (coronary artery disease)    Multiple percutaneous revascularization. Catheterization May 2013 left main normal, patent LAD stent, 90% circumflex stenosis, patent right coronary artery stents. Patient had a DES to the circumflex. The EF was well-preserved.  Marland Kitchen COPD (chronic obstructive pulmonary disease) ([place])   . Fatigue   . GERD (gastroesophageal reflux disease)   . Hard of hearing   . HTN (hypertension)   . Hx of radiation therapy    right nose 4500 cGy in 10 sessions over 5 weeks (2 treatments a week)  . Hyperlipemia   . Squamous cell carcinoma of nose    right    Patient Active Problem List   Diagnosis Date Noted  . COPD (chronic obstructive pulmonary disease) ([place]) 10/06/2017  .  Depression 10/05/2017  . Rectal pain 10/05/2017  . AP (abdominal pain)   . Atypical chest pain 11/28/2014  . Tobacco abuse 11/28/2014  . Skin cancer of nose   . Squamous cell carcinoma of skin of other and unspecified parts of face 10/15/2013  . Abdominal pain 01/30/2013  . Constipation 01/30/2013  . Hypokalemia 01/30/2013  . Leukocytosis 05/14/2012  . HTN (hypertension) 05/14/2012  . Progressive angina ([place]) 05/07/2012  . Hyperlipemia   . Dysphagia, unspecified(787.20) 02/04/2012  . Esophageal reflux 02/04/2012  . Coronary atherosclerosis 08/28/2010  . ANAL OR RECTAL PAIN 08/28/2010  . NAUSEA ALONE 08/28/2010    Past Surgical History:  Procedure Laterality Date  . ABDOMINAL HYSTERECTOMY    . CORONARY ANGIOPLASTY WITH STENT PLACEMENT  2002-2009   x 5  . CORONARY ANGIOPLASTY WITH STENT PLACEMENT  05/06/12   "1; this makes 5"  . ESOPHAGEAL DILATION  ~ 2012   Dr. Deatra Ina  . LUMBAR SPINE SURGERY  04/2011   Rods, screws and cages  . PERCUTANEOUS CORONARY STENT INTERVENTION (PCI-S) N/A 05/06/2012   Procedure: PERCUTANEOUS CORONARY STENT INTERVENTION (PCI-S);  Surgeon: Burnell Blanks, MD;  Location: [facility] CATH LAB;  Service: Cardiovascular;  Laterality: N/A;  . SKIN BIOPSY  10/15/13   right nose-microinvasive squamous cell carcinoma    OB History  No data available       Home Medications    Prior to Admission medications   Medication Sig Start Date End Date Taking? Authorizing Provider  albuterol (PROVENTIL HFA;VENTOLIN HFA) 108 (90 BASE) MCG/ACT inhaler Inhale 2 puffs into the lungs every 4 (four) hours as needed for wheezing. 12/01/14   Delfina Redwood, MD  amLODipine (NORVASC) 10 MG tablet Take 1 tablet (10 mg total) by mouth daily. 10/13/17   Cherene Altes, MD  arformoterol (BROVANA) 15 MCG/2ML NEBU Take 2 mLs (15 mcg total) by nebulization 2 (two) times daily. 10/12/17   Cherene Altes, MD  aspirin 81 MG tablet Take 1 tablet (81 mg total) by mouth  daily. 10/13/17   Cherene Altes, MD  budesonide (PULMICORT) 0.25 MG/2ML nebulizer solution Take 2 mLs (0.25 mg total) by nebulization 2 (two) times daily. 02/03/17 02/03/18  Marshell Garfinkel, MD  carvedilol (COREG) 3.125 MG tablet Take 1 tablet (3.125 mg total) by mouth 2 (two) times daily with a meal. 10/12/17   Cherene Altes, MD  cholecalciferol (VITAMIN D) 1000 UNITS tablet Take 1,000 Units by mouth daily.     [provider]  dicyclomine (BENTYL) 20 MG tablet Take 1 tablet (20 mg total) by mouth 3 (three) times daily as needed for spasms. Cramping abdominal pain. 08/16/17   Charlesetta Shanks, MD  docusate sodium (COLACE) 100 MG capsule Take 1 capsule (100 mg total) by mouth every 12 (twelve) hours. 08/16/17   Charlesetta Shanks, MD  fluticasone (FLONASE) 50 MCG/ACT nasal spray Place 2 sprays into both nostrils daily as needed for allergies or rhinitis. 10/12/17   Cherene Altes, MD  guaiFENesin (MUCINEX) 600 MG 12 hr tablet Take 1 tablet (600 mg total) by mouth 2 (two) times daily. 02/03/13   Kathie Dike, MD  isosorbide mononitrate (IMDUR) 30 MG 24 hr tablet Take 1 tablet (30 mg total) by mouth daily. 12/30/16   Minus Breeding, MD  nitroGLYCERIN (NITROSTAT) 0.4 MG SL tablet Place 0.4 mg under the tongue every 5 (five) minutes as needed for chest pain.    [provider]  polyethylene glycol powder (GLYCOLAX/MIRALAX) powder Take 255 g by mouth once for 1 dose. 11/07/17 11/07/17  Delman Goshorn S, PA-C  predniSONE (DELTASONE) 10 MG tablet Take 2 tablets 2x a day on 11/25 and 11/26, then take 2 tablets a day on 11/27 and 11/28, then take 1 tablet a day on 11/29 and 11/30, then stop taking 10/12/17   Cherene Altes, MD  psyllium (METAMUCIL SMOOTH TEXTURE) 28 % packet Take 1 packet by mouth 2 (two) times daily.    [provider]  ranitidine (ZANTAC) 300 MG tablet Take 300 mg by mouth at bedtime.     [provider]    Family History Family History    Problem Relation Age of Onset  . Glaucoma Father   . Heart attack Father   . Bone cancer Mother     Social History Social History   Tobacco Use  . Smoking status: Former Smoker    Packs/day: 0.50    Years: 55.00    Pack years: 27.50    Types: Cigarettes  . Smokeless tobacco: Former Systems developer    Types: Snuff  . Tobacco comment: Quit earlier this year.   Substance Use Topics  . Alcohol use: No    Alcohol/week: 0.0 oz  . Drug use: No     Allergies   Iohexol and Lipitor [atorvastatin calcium]   Review of  Systems Review of Systems  Constitutional: Negative for fever.  HENT: Negative for ear pain and sore throat.   Eyes: Negative for pain and visual disturbance.  Respiratory: Negative for cough and shortness of breath.   Cardiovascular: Negative for chest pain and palpitations.  Gastrointestinal: Positive for abdominal pain, constipation and nausea. Negative for diarrhea and vomiting.  Genitourinary: Negative for dysuria and hematuria.  Musculoskeletal: Positive for back pain. Negative for arthralgias.  Skin: Negative for color change and rash.  Neurological: Negative for seizures and syncope.  All other systems reviewed and are negative.    Physical Exam Updated Vital Signs BP 132/62   Pulse 61   Temp 98.5 F (36.9 C) (Oral)   Resp 18   SpO2 98%   Physical Exam  Constitutional: She appears well-developed and well-nourished. She appears distressed.  HENT:  Head: Normocephalic and atraumatic.  Eyes: Conjunctivae and EOM are normal. Pupils are equal, round, and reactive to light.  Neck: Neck supple.  Cardiovascular: Normal rate, regular rhythm and intact distal pulses.  Distant heart sounds  Pulmonary/Chest: Effort normal and breath sounds normal. No stridor. No respiratory distress. She has no wheezes.  Abdominal:  Decreased bowel sounds. Pt has TTP to the entire abdomen. Her abd is firm.  Genitourinary:  Genitourinary Comments: Rectal exam performed.  No  hemorrhoids noticed to the external anal area.  Patient had normal rectal tone and sensation.  There was a small about 1 cm pellet of stool in the rectal vault, but no fecal impaction noted.  Light brown stool noted.  No blood noted.  Musculoskeletal: She exhibits no edema.  Neurological: She is alert. She exhibits normal muscle tone.  Oriented to person, place, and situation, but not time (year).   Skin: Skin is warm and dry.  Psychiatric: She has a normal mood and affect.  Nursing note and vitals reviewed.    ED Treatments / Results  Labs (all labs ordered are listed, but only abnormal results are displayed) Labs Reviewed  COMPREHENSIVE METABOLIC PANEL - Abnormal; Notable for the following components:      Result Value   Glucose, Bld 114 (*)    Total Protein 6.4 (*)    GFR calc non Af Amer 57 (*)    All other components within normal limits  URINALYSIS, ROUTINE W REFLEX MICROSCOPIC  CBC  I-STAT TROPONIN, ED    EKG  EKG Interpretation None       Radiology Ct Abdomen Pelvis Wo Contrast  Result Date: 11/07/2017 CLINICAL DATA:  Pt unable to provide hx. Per ED notes: Pt is confused brought to er by [facility] for back pain and no BM x 2 days , evidently was seen by GI dr 2 days ago , pt has No idea why, EXAM: CT ABDOMEN AND PELVIS WITHOUT CONTRAST TECHNIQUE: Multidetector CT imaging of the abdomen and pelvis was performed following the standard protocol without IV contrast. COMPARISON:  Current abdominal radiographs.  Prior CT, 10/06/2017. FINDINGS: Lower chest: Heart mildly enlarged. Mild basilar atelectasis. No acute findings. Hepatobiliary: Liver normal in size and attenuation. Single calcification in the right lobe adjacent to the gallbladder. No liver masses. Increased attenuation material noted in the dependent gallbladder, likely sludge. This was present previously. No gallbladder wall thickening or adjacent inflammation. No bile duct dilation. Pancreas: Unremarkable. No pancreatic  ductal dilatation or surrounding inflammatory changes. Spleen: Normal in size without focal abnormality. Adrenals/Urinary Tract: Nodular thickening of the left adrenal gland, stable from the prior exam. The small adenoma suggested  the measuring 15 mm. Normal right adrenal gland. Small nonobstructing stones in the upper pole the right kidney. Single small nonobstructing stone in the midpole the left kidney. There are low-attenuation renal masses bilaterally consistent with cysts stable from the prior study. No hydronephrosis. Ureters normal course and in caliber. Bladder is unremarkable. Stomach/Bowel: Stomach and small bowel unremarkable. There are numerous colonic diverticula mostly along the sigmoid. No diverticulitis. Colon otherwise unremarkable. Appendix not visualized. Vascular/Lymphatic: Aortic atherosclerosis. No enlarged abdominal or pelvic lymph nodes. Reproductive: Status post hysterectomy. No adnexal masses. Other: No significant hernia.  No ascites. Musculoskeletal: Status post posterior lumbar spine fusion, L1 through L5. Orthopedic hardware appears well-seated and is unchanged. There are degenerative changes throughout the visualized spine. No fracture or acute finding. No osteoblastic or osteolytic lesions. IMPRESSION: 1. No acute abnormalities within the abdomen or pelvis. Stable appearance since the prior CT. 2. Small nonobstructing intrarenal stones.  Bilateral renal cysts. 3. Multiple colonic diverticular without evidence of diverticulitis. 4. Aortic atherosclerosis. 5. Status post lumbar spine fusion, L1 through L5, with well-positioned, well-seated orthopedic hardware. Electronically Signed   By: Lajean Manes M.D.   On: 11/07/2017 16:15   Dg Abd Acute W/chest  Result Date: 11/07/2017 CLINICAL DATA:  Right lower quadrant pain today with nausea. EXAM: DG ABDOMEN ACUTE W/ 1V CHEST COMPARISON:  08/16/2017 and chest x-ray 10/05/2017 as well as CT 10/06/2017 FINDINGS: Lungs are adequately  inflated and demonstrate mild prominence of the perihilar markings suggesting mild vascular congestion. No definite focal consolidation or effusion. Stable cardiomegaly. There is calcified plaque over the aortic arch. There are degenerative changes of the spine. Abdominopelvic images demonstrate a nonobstructive bowel gas pattern with mild fecal retention throughout the colon. No free peritoneal air. Stable fusion hardware over the lumbar spine. IMPRESSION: Nonobstructive bowel gas pattern with mild fecal retention throughout the colon. Findings suggesting mild vascular congestion. Electronically Signed   By: Marin Olp M.D.   On: 11/07/2017 13:25    Procedures Procedures (including critical care time)  Medications Ordered in ED Medications  ketorolac (TORADOL) injection 30 mg (not administered)  ondansetron (ZOFRAN) injection 4 mg (4 mg Intravenous Given 11/07/17 1238)     Initial Impression / Assessment and Plan / ED Course  I have reviewed the triage vital signs and the nursing notes.  Pertinent labs & imaging results that were available during my care of the patient were reviewed by me and considered in my medical decision making (see chart for details).    Rechecked patient.  She states that her nausea has improved since Zofran.  She still is having abdominal pain, but is no longer in obvious distress.  Radiography showed stool burden.  Will perform rectal to evaluate possible impaction.  Rechecked patient.  She is using the restroom.  Will recheck later.  Rechecked patient.  She has exhibited no episodes of vomiting in the ED.  Discussed the results of the lab work and UA showing significant abnormalities.  Discussed the results of the KUB and CT abdomen.  Discussed that she has likely experiencing symptoms because of constipation and that she should begin taking MiraLAX as she has in the past.  I advised her to follow-up with her PCP to develop a long-term regimen control her symptoms  since she has had multiple episodes of constipation in the past.  Also discussed that she has trochanteric bursitis.  We will give IM Toradol while in the ED.  Advised anti-inflammatories, rice protocol for symptoms.  Gave strict return  precautions for persistent constipation, persistent abdominal pain, fever, or any numbness, weakness to the bilateral lower extremities, bowel or bladder incontinence, or any new or worsening symptoms.  Patient understands.  All questions answered.   Final Clinical Impressions(s) / ED Diagnoses   Final diagnoses:  Abdominal pain, unspecified abdominal location  Constipation, unspecified constipation type  Greater trochanteric bursitis of right hip   Patient presented to the ED complaining of back pain and abdominal pain.  On review of her notes it appears that she is frequently seen in the ED for similar complaints of chronic body pain, abdominal pain, and constipation.  The back pain does not appear to be acute and does not require immediate intervention at this time.  She has a history of lumbar spine fusion and has degenerative changes throughout the spine.  She has no red flag symptoms and neuro exam negative. She does have TTP to the right trochanteric bursae. Will give shot of Toradol in ED. Advised antiinflammatories and RICE at home.   With respect to her abdominal pain, she had generalized TTP to the abdomen.  Labs and urinalysis were negative.  Her KUB showed a nonobstructive bowel gas pattern with mild fecal retention throughout the colon.  Her abdominal CT was stable since prior CT in August 2018.  No acute abnormalities within the abdomen or pelvis.  Her symptoms are likely related to constipation.  SHe was not impacted on exam.  She states enemas have not been helpful for her in the past.  She used to be on MiraLAX, will restart her on this.  Advised strict report return precautions.    ED Discharge Orders        Ordered    polyethylene glycol powder  (GLYCOLAX/MIRALAX) powder   Once     11/07/17 [address] PA-C 11/07/17 1735  Jola Schmidt, MD 11/07/17 [phone]

## 2017-11-07 NOTE — ED Notes (Signed)
ED Provider at bedside. 

## 2017-11-07 NOTE — Discharge Instructions (Signed)
Please take MiraLAX daily for the next 7 days for your constipation.  Rotate Tylenol and ibuprofen for the pain that you are having in your hip. You may also use warm and cold compresses to reduce the inflammation.  Please follow-up with your primary care doctor within the next 3-5 days for your reevaluation of your symptoms. Please return to the ER If you have any persistent constipation, worsening symptoms of abdominal pain, fevers, numbness, weakness to your bilateral lower extremities, any urinary or bowel incontinence, or any new or worsening symptoms.

## 2017-11-07 NOTE — ED Triage Notes (Signed)
Pt is confused brought to er by  [facility] for back pain and no BM x 2 days , evidently was seen by GI dr 2 days ago , pt has  No idea why, pt is on home o2

## 2017-11-28 ENCOUNTER — Encounter: Payer: Self-pay | Admitting: Pulmonary Disease

## 2017-11-28 ENCOUNTER — Ambulatory Visit (INDEPENDENT_AMBULATORY_CARE_PROVIDER_SITE_OTHER): Payer: Medicare Other | Admitting: Pulmonary Disease

## 2017-11-28 VITALS — BP 132/70 | HR 78 | Ht 64.0 in | Wt 193.2 lb

## 2017-11-28 DIAGNOSIS — J449 Chronic obstructive pulmonary disease, unspecified: Secondary | ICD-10-CM | POA: Diagnosis not present

## 2017-11-28 DIAGNOSIS — J441 Chronic obstructive pulmonary disease with (acute) exacerbation: Secondary | ICD-10-CM

## 2017-11-28 MED ORDER — BUDESONIDE 0.25 MG/2ML IN SUSP: 0.2500 mg | Freq: Two times a day (BID) | RESPIRATORY_TRACT | 11 refills | Status: DC

## 2017-11-28 MED ORDER — ARFORMOTEROL TARTRATE 15 MCG/2ML IN NEBU: 15.0000 ug | INHALATION_SOLUTION | Freq: Two times a day (BID) | RESPIRATORY_TRACT | 0 refills | Status: DC

## 2017-11-28 NOTE — Progress Notes (Addendum)
[name]    [id number]    1935/08/08  Primary Care Physician:Elkins, [name], MD  Referring Physician: Leonard Downing, MD [address] [place], [place] [address]  Chief complaint:  Follow up for COPD GOLD B (CAT score 32, 1 exacerbations over past year)  HPI:  [name] is a 83 year old with past history as below. She has history of coronary artery disease with multiple stents. She had a stress test in January 2016 which was negative. She continues to complain of dyspnea and has been referred here by her cardiologist for further evaluation. She was started on supplemental oxygen at 2 L/m 2 weeks ago for desaturations. Her chief complaints are dyspnea on exertion. She denies any dyspnea at rest, cough, sputum production, wheezing. She has about 30-pack-year smoking history and quit in 2016.  Interim history At last visit her regimen was changed to [facility] and Pulmicort.  She is stable on these inhalers.  She had a hospital admission in November 2018 for dyspnea, abdominal pain.  Chest x-ray at that time does not show any acute process.  Treated for COPD exacerbation with BiPAP, Solu-Medrol.  Evaluated for abdominal pain of unclear etiology with negative CT of the abdomen.  In office today her dyspnea is at baseline.  Denies any cough, wheezing, sputum production, fevers, chills.  She continues to have diffuse nonspecific abdominal pain and is scheduled to see Dr. Silverio Decamp, GI next month.  Outpatient Encounter Medications as of 11/28/2017  Medication Sig  . albuterol (PROVENTIL HFA;VENTOLIN HFA) 108 (90 BASE) MCG/ACT inhaler Inhale 2 puffs into the lungs every 4 (four) hours as needed for wheezing.  Marland Kitchen amLODipine (NORVASC) 10 MG tablet Take 1 tablet (10 mg total) by mouth daily.  Marland Kitchen arformoterol (BROVANA) 15 MCG/2ML NEBU Take 2 mLs (15 mcg total) by nebulization 2 (two) times daily.  Marland Kitchen aspirin 81 MG tablet Take 1 tablet (81 mg total) by mouth daily.  .  budesonide (PULMICORT) 0.25 MG/2ML nebulizer solution Take 2 mLs (0.25 mg total) by nebulization 2 (two) times daily.  . carvedilol (COREG) 3.125 MG tablet Take 1 tablet (3.125 mg total) by mouth 2 (two) times daily with a meal.  . cholecalciferol (VITAMIN D) 1000 UNITS tablet Take 1,000 Units by mouth daily.   Marland Kitchen dicyclomine (BENTYL) 20 MG tablet Take 1 tablet (20 mg total) by mouth 3 (three) times daily as needed for spasms. Cramping abdominal pain.  Marland Kitchen docusate sodium (COLACE) 100 MG capsule Take 1 capsule (100 mg total) by mouth every 12 (twelve) hours.  . fluticasone (FLONASE) 50 MCG/ACT nasal spray Place 2 sprays into both nostrils daily as needed for allergies or rhinitis.  Marland Kitchen guaiFENesin (MUCINEX) 600 MG 12 hr tablet Take 1 tablet (600 mg total) by mouth 2 (two) times daily.  . isosorbide mononitrate (IMDUR) 30 MG 24 hr tablet Take 1 tablet (30 mg total) by mouth daily.  . nitroGLYCERIN (NITROSTAT) 0.4 MG SL tablet Place 0.4 mg under the tongue every 5 (five) minutes as needed for chest pain.  Marland Kitchen psyllium (METAMUCIL SMOOTH TEXTURE) 28 % packet Take 1 packet by mouth 2 (two) times daily.  . ranitidine (ZANTAC) 300 MG tablet Take 300 mg by mouth at bedtime.   . traMADol (ULTRAM) 50 MG tablet Take 50 mg by mouth every 4 (four) hours as needed for moderate pain.  . [DISCONTINUED] predniSONE (DELTASONE) 10 MG tablet Take 2 tablets 2x a day on 11/25 and 11/26, then  take 2 tablets a day on 11/27 and 11/28, then take 1 tablet a day on 11/29 and 11/30, then stop taking   No facility-administered encounter medications on file as of 11/28/2017.     Allergies as of 11/28/2017 - Review Complete 11/28/2017  Allergen Reaction Noted  . Iohexol Other (See Comments) 08/28/2010  . Lipitor [atorvastatin calcium] Hives 02/05/2012    Past Medical History:  Diagnosis Date  . Arthritis    "aching bones sometimes"  . Blurred vision   . CAD (coronary artery disease)    Multiple percutaneous revascularization.  Catheterization May 2013 left main normal, patent LAD stent, 90% circumflex stenosis, patent right coronary artery stents. Patient had a DES to the circumflex. The EF was well-preserved.  Marland Kitchen COPD (chronic obstructive pulmonary disease) ([place])   . Fatigue   . GERD (gastroesophageal reflux disease)   . Hard of hearing   . HTN (hypertension)   . Hx of radiation therapy    right nose 4500 cGy in 10 sessions over 5 weeks (2 treatments a week)  . Hyperlipemia   . Squamous cell carcinoma of nose    right    Past Surgical History:  Procedure Laterality Date  . ABDOMINAL HYSTERECTOMY    . CORONARY ANGIOPLASTY WITH STENT PLACEMENT  2002-2009   x 5  . CORONARY ANGIOPLASTY WITH STENT PLACEMENT  05/06/12   "1; this makes 5"  . ESOPHAGEAL DILATION  ~ 2012   Dr. Deatra Ina  . LUMBAR SPINE SURGERY  04/2011   Rods, screws and cages  . PERCUTANEOUS CORONARY STENT INTERVENTION (PCI-S) N/A 05/06/2012   Procedure: PERCUTANEOUS CORONARY STENT INTERVENTION (PCI-S);  Surgeon: Burnell Blanks, MD;  Location: [facility] CATH LAB;  Service: Cardiovascular;  Laterality: N/A;  . SKIN BIOPSY  10/15/13   right nose-microinvasive squamous cell carcinoma    Family History  Problem Relation Age of Onset  . Glaucoma Father   . Heart attack Father   . Bone cancer Mother     Social History   Socioeconomic History  . Marital status: Widowed    Spouse name: Not on file  . Number of children: 4  . Years of education: Not on file  . Highest education level: Not on file  Social Needs  . Financial resource strain: Not on file  . Food insecurity - worry: Not on file  . Food insecurity - inability: Not on file  . Transportation needs - medical: Not on file  . Transportation needs - non-medical: Not on file  Occupational History  . Occupation: retired    Fish farm manager: RETIRED  Tobacco Use  . Smoking status: Former Smoker    Packs/day: 0.50    Years: 55.00    Pack years: 27.50    Types: Cigarettes    Last attempt to  quit: 11/28/2012    Years since quitting: 5.0  . Smokeless tobacco: Never Used  . Tobacco comment: Quit earlier this year.   Substance and Sexual Activity  . Alcohol use: No    Alcohol/week: 0.0 oz  . Drug use: No  . Sexual activity: No    Comment: 1 cig to 1/4 of a pack a day  Other Topics Concern  . Not on file  Social History Narrative  . Not on file    Review of systems: Review of Systems  Constitutional: Negative for fever and chills.  HENT: Negative.   Eyes: Negative for blurred vision.  Respiratory: as per HPI  Cardiovascular: Negative for chest pain and palpitations.  Gastrointestinal: Negative for vomiting, diarrhea, blood per rectum. Genitourinary: Negative for dysuria, urgency, frequency and hematuria.  Musculoskeletal: Negative for myalgias, back pain and joint pain.  Skin: Negative for itching and rash.  Neurological: Negative for dizziness, tremors, focal weakness, seizures and loss of consciousness.  Endo/Heme/Allergies: Negative for environmental allergies.  Psychiatric/Behavioral: Negative for depression, suicidal ideas and hallucinations.  All other systems reviewed and are negative.  Physical Exam: Blood pressure 132/70, pulse 78, height 5\' 4"  (1.626 m), weight 193 lb 3.2 oz (87.6 kg), SpO2 94 %. Gen:      No acute distress HEENT:  EOMI, sclera anicteric Neck:     No masses; no thyromegaly Lungs:    Clear to auscultation bilaterally; normal respiratory effort CV:         Regular rate and rhythm; no murmurs Abd:      + bowel sounds; soft, non-tender; no palpable masses, no distension Ext:    No edema; adequate peripheral perfusion Skin:      Warm and dry; no rash Neuro: alert and oriented x 3 Psych: normal mood and affect  Data Reviewed: Chest x-ray 12/05/16-no active cardiac pulmonary disease. Abdominal CT 12/26/16-right diaphragm hernia, and basilar atelectasis or mild scarring Abdominal CT 10/06/17-bibasilar atelectasis Abdominal CT 11/07/17-stable  bibasilar atelectasis I reviewed all images personally.  PFTs 10/27/16 FVC 1.82 (66%),FEV1 0.92 (44%), F/F 50, TLC 82%, DLCO 19% Severe obstructive airway disease with broncho dilator response Severe diffusion defect  Assessment:  COPD GOLD B Severe COPD with a bronchodilator response.  Appears to have recovered from her recent COPD exacerbation.  Continue on Brovana and Pulmicort with albuterol as needed Imaging from this year reviewed with stable basilar atelectasis Requalified for oxygen in office today.  Plan/Recommendations: - Continue Brovana, pulmicort - Albuterol nebs PRN  Return in 1-2 months.   Marshell Garfinkel MD [facility] Pager [phone] 11/28/2017, 9:08 AM  CC: Leonard Downing, *

## 2017-11-28 NOTE — Patient Instructions (Addendum)
Continue using her nebulizers as prescribed.  Will call in a refill for them We will reorder your oxygen based on your walk test today Follow-up in 1 year

## 2017-12-23 ENCOUNTER — Encounter: Payer: Self-pay | Admitting: Gastroenterology

## 2017-12-23 ENCOUNTER — Ambulatory Visit (INDEPENDENT_AMBULATORY_CARE_PROVIDER_SITE_OTHER): Payer: Medicare Other | Admitting: Gastroenterology

## 2017-12-23 VITALS — BP 114/68 | HR 72 | Ht 64.0 in | Wt 191.5 lb

## 2017-12-23 DIAGNOSIS — K5909 Other constipation: Secondary | ICD-10-CM

## 2017-12-23 DIAGNOSIS — R1084 Generalized abdominal pain: Secondary | ICD-10-CM | POA: Diagnosis not present

## 2017-12-23 DIAGNOSIS — R195 Other fecal abnormalities: Secondary | ICD-10-CM

## 2017-12-23 DIAGNOSIS — K219 Gastro-esophageal reflux disease without esophagitis: Secondary | ICD-10-CM

## 2017-12-23 MED ORDER — LUBIPROSTONE 24 MCG PO CAPS: 24.0000 ug | ORAL_CAPSULE | Freq: Two times a day (BID) | ORAL | 3 refills | Status: DC

## 2017-12-23 NOTE — Patient Instructions (Signed)
We have sent Amitiza to your pharmacy   Constipation, Adult Constipation is when a person:  Poops (has a bowel movement) fewer times in a week than normal.  Has a hard time pooping.  Has poop that is dry, hard, or bigger than normal.  Follow these instructions at home: Eating and drinking   Eat foods that have a lot of fiber, such as: ? Fresh fruits and vegetables. ? Whole grains. ? Beans.  Eat less of foods that are high in fat, low in fiber, or overly processed, such as: ? [place] fries. ? Hamburgers. ? Cookies. ? Candy. ? Soda.  Drink enough fluid to keep your pee (urine) clear or pale yellow. General instructions  Exercise regularly or as told by your doctor.  Go to the restroom when you feel like you need to poop. Do not hold it in.  Take over-the-counter and prescription medicines only as told by your doctor. These include any fiber supplements.  Do pelvic floor retraining exercises, such as: ? Doing deep breathing while relaxing your lower belly (abdomen). ? Relaxing your pelvic floor while pooping.  Watch your condition for any changes.  Keep all follow-up visits as told by your doctor. This is important. Contact a doctor if:  You have pain that gets worse.  You have a fever.  You have not pooped for 4 days.  You throw up (vomit).  You are not hungry.  You lose weight.  You are bleeding from the anus.  You have thin, pencil-like poop (stool). Get help right away if:  You have a fever, and your symptoms suddenly get worse.  You leak poop or have blood in your poop.  Your belly feels hard or bigger than normal (is bloated).  You have very bad belly pain.  You feel dizzy or you faint. This information is not intended to replace advice given to you by your health care provider. Make sure you discuss any questions you have with your health care provider. Document Released: 04/22/2008 Document Revised: 05/24/2016 Document Reviewed:  04/24/2016 Elsevier Interactive Patient Education  2018 [employer].

## 2017-12-23 NOTE — Progress Notes (Signed)
[name]    [id number]    Sep 15, 1935  Primary Care Physician:Elkins, [name], MD  Referring Physician: Leonard Downing, MD [address] [place], [place] [address]  Chief complaint: Generalized abdominal pain  HPI: 83 year old female with multiple comorbidities was referred here for evaluation of Heme positive stool.  She denies having any rectal bleeding or dark stool.  She complains of generalized abdominal pain.  She was seen in the ER last month for abdominal pain and also chest pain.  She had 3 CT abdomen and pelvis with or without contrast within the past 6 months, negative for any specific acute GI pathology.  Last colonoscopy in October 2011 showed mild segmental colitis associated with severe diverticulosis in the left side of the colon otherwise was a normal exam and she had 2 EGDs in October 2011 and March 2013 multiple duodenal erosions and esophageal stricture status post Maloney dilation to 18 mm.  Patient denies any dysphagia, odynophagia, heartburn, nausea or vomiting.  She has worsening chronic constipation with bowel movement every 2-3 days.  Taking over-the-counter stool softeners with no improvement.    Outpatient Encounter Medications as of 12/23/2017  Medication Sig  . albuterol (PROVENTIL HFA;VENTOLIN HFA) 108 (90 BASE) MCG/ACT inhaler Inhale 2 puffs into the lungs every 4 (four) hours as needed for wheezing.  Marland Kitchen amLODipine (NORVASC) 10 MG tablet Take 1 tablet (10 mg total) by mouth daily.  Marland Kitchen arformoterol (BROVANA) 15 MCG/2ML NEBU Take 2 mLs (15 mcg total) by nebulization 2 (two) times daily.  Marland Kitchen aspirin 81 MG tablet Take 1 tablet (81 mg total) by mouth daily.  . budesonide (PULMICORT) 0.25 MG/2ML nebulizer solution Take 2 mLs (0.25 mg total) by nebulization 2 (two) times daily.  . carvedilol (COREG) 3.125 MG tablet Take 1 tablet (3.125 mg total) by mouth 2 (two) times daily with a meal.  . cholecalciferol (VITAMIN D) 1000 UNITS tablet  Take 1,000 Units by mouth daily.   Marland Kitchen dicyclomine (BENTYL) 20 MG tablet Take 1 tablet (20 mg total) by mouth 3 (three) times daily as needed for spasms. Cramping abdominal pain.  Marland Kitchen docusate sodium (COLACE) 100 MG capsule Take 1 capsule (100 mg total) by mouth every 12 (twelve) hours.  . fluticasone (FLONASE) 50 MCG/ACT nasal spray Place 2 sprays into both nostrils daily as needed for allergies or rhinitis.  Marland Kitchen guaiFENesin (MUCINEX) 600 MG 12 hr tablet Take 1 tablet (600 mg total) by mouth 2 (two) times daily.  . isosorbide mononitrate (IMDUR) 30 MG 24 hr tablet Take 1 tablet (30 mg total) by mouth daily.  . nitroGLYCERIN (NITROSTAT) 0.4 MG SL tablet Place 0.4 mg under the tongue every 5 (five) minutes as needed for chest pain.  Marland Kitchen psyllium (METAMUCIL SMOOTH TEXTURE) 28 % packet Take 1 packet by mouth 2 (two) times daily.  . ranitidine (ZANTAC) 300 MG tablet Take 300 mg by mouth at bedtime.   . traMADol (ULTRAM) 50 MG tablet Take 50 mg by mouth every 4 (four) hours as needed for moderate pain.  . [DISCONTINUED] calcium carbonate (OS-CAL) 600 MG TABS Take 600 mg by mouth daily.    . [DISCONTINUED] pravastatin (PRAVACHOL) 40 MG tablet Take 40 mg by mouth daily.     No facility-administered encounter medications on file as of 12/23/2017.     Allergies as of 12/23/2017 - Review Complete 12/23/2017  Allergen Reaction Noted  . Iohexol Other (See Comments) 08/28/2010  . Lipitor [employer]  calcium] Hives 02/05/2012    Past Medical History:  Diagnosis Date  . Arthritis    "aching bones sometimes"  . Blurred vision   . CAD (coronary artery disease)    Multiple percutaneous revascularization. Catheterization May 2013 left main normal, patent LAD stent, 90% circumflex stenosis, patent right coronary artery stents. Patient had a DES to the circumflex. The EF was well-preserved.  Marland Kitchen COPD (chronic obstructive pulmonary disease) ([place])   . Fatigue   . GERD (gastroesophageal reflux disease)   . Hard of  hearing   . HTN (hypertension)   . Hx of radiation therapy    right nose 4500 cGy in 10 sessions over 5 weeks (2 treatments a week)  . Hyperlipemia   . Squamous cell carcinoma of nose    right    Past Surgical History:  Procedure Laterality Date  . ABDOMINAL HYSTERECTOMY    . CORONARY ANGIOPLASTY WITH STENT PLACEMENT  2002-2009   x 5  . CORONARY ANGIOPLASTY WITH STENT PLACEMENT  05/06/12   "1; this makes 5"  . ESOPHAGEAL DILATION  ~ 2012   Dr. Deatra Ina  . LUMBAR SPINE SURGERY  04/2011   Rods, screws and cages  . PERCUTANEOUS CORONARY STENT INTERVENTION (PCI-S) N/A 05/06/2012   Procedure: PERCUTANEOUS CORONARY STENT INTERVENTION (PCI-S);  Surgeon: Burnell Blanks, MD;  Location: [facility] CATH LAB;  Service: Cardiovascular;  Laterality: N/A;  . SKIN BIOPSY  10/15/13   right nose-microinvasive squamous cell carcinoma    Family History  Problem Relation Age of Onset  . Glaucoma Father   . Heart attack Father   . Bone cancer Mother     Social History   Socioeconomic History  . Marital status: Widowed    Spouse name: Not on file  . Number of children: 4  . Years of education: Not on file  . Highest education level: Not on file  Social Needs  . Financial resource strain: Not on file  . Food insecurity - worry: Not on file  . Food insecurity - inability: Not on file  . Transportation needs - medical: Not on file  . Transportation needs - non-medical: Not on file  Occupational History  . Occupation: retired    Fish farm manager: RETIRED  Tobacco Use  . Smoking status: Former Smoker    Packs/day: 0.50    Years: 55.00    Pack years: 27.50    Types: Cigarettes    Last attempt to quit: 11/28/2012    Years since quitting: 5.0  . Smokeless tobacco: Never Used  . Tobacco comment: Quit earlier this year.   Substance and Sexual Activity  . Alcohol use: No    Alcohol/week: 0.0 oz  . Drug use: No  . Sexual activity: No    Comment: 1 cig to 1/4 of a pack a day  Other Topics Concern  .  Not on file  Social History Narrative  . Not on file      Review of systems: Review of Systems  Constitutional: Negative for fever and chills.  HENT: Positive for hearing problem Eyes: Negative for blurred vision.  Respiratory: Negative for cough, shortness of breath and wheezing.   Cardiovascular: Negative for chest pain and palpitations.  Gastrointestinal: as per HPI Genitourinary: Negative for dysuria, urgency, frequency and hematuria.  Musculoskeletal: Positive for myalgias, back pain and joint pain.  Skin: Negative for itching and rash.  Neurological: Negative for dizziness, tremors, focal weakness, seizures and loss of consciousness.  Endo/Heme/Allergies: Positive for seasonal allergies.  Psychiatric/Behavioral: Negative  for depression, suicidal ideas and hallucinations.  All other systems reviewed and are negative.   Physical Exam: Vitals:   12/23/17 1315  BP: 114/68  Pulse: 72   Body mass index is 32.87 kg/m. Gen:      No acute distress, wheelchair-bound HEENT:  EOMI, sclera anicteric Neck:     No masses; no thyromegaly Lungs:    Clear to auscultation bilaterally; normal respiratory effort CV:         Regular rate and rhythm; no murmurs Abd:      + bowel sounds; soft, non-tender; no palpable masses, no distension Ext:    No edema; adequate peripheral perfusion Skin:      Warm and dry; no rash Neuro: alert and oriented x 3 Psych: normal mood and affect  Data Reviewed:  Reviewed labs, radiology imaging, old records and pertinent past GI work up   Assessment and Plan/Recommendations:  83 year old female with CAD, severe COPD on continuous oxygen, wheelchair bound, obese, chronic GERD referred for evaluation of heme positive stool Patient had screening colonoscopy in 2011 with no polyps Multiple CT abdomen and pelvis with no mass lesions or colonic thickening Patient does have history of duodenal erosions, gastritis and also segmental colitis associated with  severe diverticulosis in the left side, any of those could be a likely etiology for heme positive stool.  Do not recommend doing fecal Hemoccult Hemoglobin is within normal limits with no evidence of anemia. Patient unlikely to tolerate endoscopic evaluation and potential risks outweigh the benefits  Chronic constipation: Start Amitiza 24 mcg twice daily Increase dietary fiber and fluid intake  Generalized abdominal pain could be secondary to chronic constipation, multiple CT abdomen and pelvis unrevealing for any specific acute GI pathology   K. Denzil Magnuson , MD [phone] Mon-Fri 8a-5p [phone] after 5p, weekends, holidays  CC: Leonard Downing, *

## 2017-12-29 NOTE — Progress Notes (Deleted)
HP The patient presents for follow up of CAD. She's had a history of coronary disease with multiple stents.   Since I last saw her in Feb of last year she was in the ED 5 times.  She was admitted in Nov.  I reviewed these records for this visit.   This was for dyspnea with an acute exacerbation of his COPD.   He did have mildly elevated troponin.   Ischemia work up was not indicated.   ***   last week.  I reviewed these records.  She had multiple complaints including back pain and worsening SOB.  She was treated with IV steroids and Ultram for back pain.  She has lots of somatic complaints today as well. However, she's not having left arm pain which was her previous angina. She is on chronic oxygen now and actually has had pulmonary function tests and has follow-up in a couple of weeks with pulmonary. She's not describing chest pain but mostly describing upper epigastric and abdominal discomfort. She's not having any PND or orthopnea. She's not describing palpitations, presyncope or syncope. She has lots of muscle aches and pains.   Allergies  Allergen Reactions  . Iohexol Other (See Comments)    Code:  HIVES, Desc:  PER ROBIN @ GI, PT IS ALLERGIC TO IVP DYE 08/28/10 RM  . Lipitor [Atorvastatin Calcium] Hives    Current Outpatient Medications  Medication Sig Dispense Refill  . albuterol (PROVENTIL HFA;VENTOLIN HFA) 108 (90 BASE) MCG/ACT inhaler Inhale 2 puffs into the lungs every 4 (four) hours as needed for wheezing. 1 Inhaler 0  . amLODipine (NORVASC) 10 MG tablet Take 1 tablet (10 mg total) by mouth daily. 30 tablet 0  . arformoterol (BROVANA) 15 MCG/2ML NEBU Take 2 mLs (15 mcg total) by nebulization 2 (two) times daily. 120 mL 0  . aspirin 81 MG tablet Take 1 tablet (81 mg total) by mouth daily.    . budesonide (PULMICORT) 0.25 MG/2ML nebulizer solution Take 2 mLs (0.25 mg total) by nebulization 2 (two) times daily. 120 mL 11  . carvedilol (COREG) 3.125 MG tablet Take 1 tablet (3.125 mg  total) by mouth 2 (two) times daily with a meal. 60 tablet 0  . cholecalciferol (VITAMIN D) 1000 UNITS tablet Take 1,000 Units by mouth daily.     Marland Kitchen dicyclomine (BENTYL) 20 MG tablet Take 1 tablet (20 mg total) by mouth 3 (three) times daily as needed for spasms. Cramping abdominal pain. 30 tablet 0  . docusate sodium (COLACE) 100 MG capsule Take 1 capsule (100 mg total) by mouth every 12 (twelve) hours. 60 capsule 2  . fluticasone (FLONASE) 50 MCG/ACT nasal spray Place 2 sprays into both nostrils daily as needed for allergies or rhinitis. 16 g 0  . guaiFENesin (MUCINEX) 600 MG 12 hr tablet Take 1 tablet (600 mg total) by mouth 2 (two) times daily. 14 tablet 0  . isosorbide mononitrate (IMDUR) 30 MG 24 hr tablet Take 1 tablet (30 mg total) by mouth daily. 90 tablet 3  . lubiprostone (AMITIZA) 24 MCG capsule Take 1 capsule (24 mcg total) by mouth 2 (two) times daily with a meal. 60 capsule 3  . nitroGLYCERIN (NITROSTAT) 0.4 MG SL tablet Place 0.4 mg under the tongue every 5 (five) minutes as needed for chest pain.    Marland Kitchen psyllium (METAMUCIL SMOOTH TEXTURE) 28 % packet Take 1 packet by mouth 2 (two) times daily.    . ranitidine (ZANTAC) 300 MG tablet Take 300 mg  by mouth at bedtime.     . traMADol (ULTRAM) 50 MG tablet Take 50 mg by mouth every 4 (four) hours as needed for moderate pain.     No current facility-administered medications for this visit.     Past Medical History:  Diagnosis Date  . Arthritis    "aching bones sometimes"  . Blurred vision   . CAD (coronary artery disease)    Multiple percutaneous revascularization. Catheterization May 2013 left main normal, patent LAD stent, 90% circumflex stenosis, patent right coronary artery stents. Patient had a DES to the circumflex. The EF was well-preserved.  Marland Kitchen COPD (chronic obstructive pulmonary disease) ([place])   . Fatigue   . GERD (gastroesophageal reflux disease)   . Hard of hearing   . HTN (hypertension)   . Hx of radiation therapy     right nose 4500 cGy in 10 sessions over 5 weeks (2 treatments a week)  . Hyperlipemia   . Squamous cell carcinoma of nose    right    Past Surgical History:  Procedure Laterality Date  . ABDOMINAL HYSTERECTOMY    . CORONARY ANGIOPLASTY WITH STENT PLACEMENT  2002-2009   x 5  . CORONARY ANGIOPLASTY WITH STENT PLACEMENT  05/06/12   "1; this makes 5"  . ESOPHAGEAL DILATION  ~ 2012   Dr. Deatra Ina  . LUMBAR SPINE SURGERY  04/2011   Rods, screws and cages  . PERCUTANEOUS CORONARY STENT INTERVENTION (PCI-S) N/A 05/06/2012   Procedure: PERCUTANEOUS CORONARY STENT INTERVENTION (PCI-S);  Surgeon: Burnell Blanks, MD;  Location: [facility] CATH LAB;  Service: Cardiovascular;  Laterality: N/A;  . SKIN BIOPSY  10/15/13   right nose-microinvasive squamous cell carcinoma    ROS:     ***   PHYSICAL EXAM There were no vitals taken for this visit.  GENERAL:  Well appearing NECK:  No jugular venous distention, waveform within normal limits, carotid upstroke brisk and symmetric, no bruits, no thyromegaly LUNGS:  Clear to auscultation bilaterally CHEST:  Unremarkable HEART:  PMI not displaced or sustained,S1 and S2 within normal limits, no S3, no S4, no clicks, no rubs, *** murmurs ABD:  Flat, positive bowel sounds normal in frequency in pitch, no bruits, no rebound, no guarding, no midline pulsatile mass, no hepatomegaly, no splenomegaly EXT:  2 plus pulses throughout, no edema, no cyanosis no clubbing    GENERAL:  In no acute distress.  HEENT:  Pupils equal round and reactive, fundi not visualized, oral mucosa unremarkable, dentures NECK:  No jugular venous distention, waveform within normal limits, carotid upstroke brisk and symmetric, no bruits, no thyromegaly LUNGS:  Clear to auscultation bilaterally BACK:  No CVA tenderness HEART:  PMI not displaced or sustained,S1 and S2 within normal limits, no S3, no S4, no clicks, no rubs, no murmurs ABD:  Flat, positive bowel sounds normal in frequency in  pitch, no bruits, no rebound, no guarding, no midline pulsatile mass, no hepatomegaly, no splenomegaly EXT:  2 plus pulses throughout, no edema    EKG:  ***   Lab Results  Component Value Date   CHOL 194 10/06/2017   TRIG 38 10/06/2017   HDL 64 10/06/2017   LDLCALC 122 (H) 10/06/2017    ASSESSMENT AND PLAN  DYSPNEA - ***   I think this is multifactorial.  I do not see a clear cardiac etiology as previously discussed. She has follow-up with pulmonary.  CAD -  The patient had a negative stress perfusion study in Jan 2016.  ***  She's not  having any of her previous anginal symptoms. She ran out of Imdur and I will renew this.  Hyperlipemia -  LDL was not at target.  ***   She has not had her lipids checked since 2016.  I will repeat a lipid profile.  HTN (hypertension) -  The blood pressure is *** at target. No change in medications is indicated. We will continue with therapeutic lifestyle changes (TLC).  TOBACCO ABUSE -  ***   She told me at the last visit that she quit smoking.   Her daughter verifies that she is still not smoking.    Recent ED records and hospital admission reviewed for this visit.

## 2017-12-30 ENCOUNTER — Ambulatory Visit: Payer: Medicare Other | Admitting: Cardiology

## 2018-01-08 ENCOUNTER — Other Ambulatory Visit: Payer: Self-pay | Admitting: Cardiology

## 2018-01-08 NOTE — Telephone Encounter (Signed)
REFILL 

## 2018-01-11 ENCOUNTER — Other Ambulatory Visit: Payer: Self-pay | Admitting: Cardiology

## 2018-02-17 ENCOUNTER — Inpatient Hospital Stay (HOSPITAL_COMMUNITY)
Admission: EM | Admit: 2018-02-17 | Discharge: 2018-02-21 | DRG: 190 | Disposition: A | Discharge status: Skilled Nursing Facility | Payer: Medicare Other | Attending: Student in an Organized Health Care Education/Training Program | Admitting: Student in an Organized Health Care Education/Training Program

## 2018-02-17 ENCOUNTER — Encounter (HOSPITAL_COMMUNITY): Payer: Self-pay

## 2018-02-17 ENCOUNTER — Observation Stay (HOSPITAL_COMMUNITY): Payer: Medicare Other

## 2018-02-17 ENCOUNTER — Other Ambulatory Visit: Payer: Self-pay

## 2018-02-17 ENCOUNTER — Emergency Department (HOSPITAL_COMMUNITY): Payer: Medicare Other

## 2018-02-17 DIAGNOSIS — I2510 Atherosclerotic heart disease of native coronary artery without angina pectoris: Secondary | ICD-10-CM | POA: Diagnosis present

## 2018-02-17 DIAGNOSIS — Z91128 Patient's intentional underdosing of medication regimen for other reason: Secondary | ICD-10-CM

## 2018-02-17 DIAGNOSIS — Z85828 Personal history of other malignant neoplasm of skin: Secondary | ICD-10-CM

## 2018-02-17 DIAGNOSIS — H9110 Presbycusis, unspecified ear: Secondary | ICD-10-CM | POA: Diagnosis present

## 2018-02-17 DIAGNOSIS — L821 Other seborrheic keratosis: Secondary | ICD-10-CM | POA: Diagnosis present

## 2018-02-17 DIAGNOSIS — J9621 Acute and chronic respiratory failure with hypoxia: Secondary | ICD-10-CM | POA: Diagnosis not present

## 2018-02-17 DIAGNOSIS — Z7982 Long term (current) use of aspirin: Secondary | ICD-10-CM

## 2018-02-17 DIAGNOSIS — Z91041 Radiographic dye allergy status: Secondary | ICD-10-CM

## 2018-02-17 DIAGNOSIS — J189 Pneumonia, unspecified organism: Secondary | ICD-10-CM

## 2018-02-17 DIAGNOSIS — K219 Gastro-esophageal reflux disease without esophagitis: Secondary | ICD-10-CM | POA: Diagnosis present

## 2018-02-17 DIAGNOSIS — R109 Unspecified abdominal pain: Secondary | ICD-10-CM | POA: Diagnosis present

## 2018-02-17 DIAGNOSIS — K5730 Diverticulosis of large intestine without perforation or abscess without bleeding: Secondary | ICD-10-CM | POA: Diagnosis present

## 2018-02-17 DIAGNOSIS — T486X6A Underdosing of antiasthmatics, initial encounter: Secondary | ICD-10-CM | POA: Diagnosis present

## 2018-02-17 DIAGNOSIS — K5900 Constipation, unspecified: Secondary | ICD-10-CM | POA: Diagnosis present

## 2018-02-17 DIAGNOSIS — J441 Chronic obstructive pulmonary disease with (acute) exacerbation: Secondary | ICD-10-CM

## 2018-02-17 DIAGNOSIS — Z87891 Personal history of nicotine dependence: Secondary | ICD-10-CM

## 2018-02-17 DIAGNOSIS — E669 Obesity, unspecified: Secondary | ICD-10-CM | POA: Diagnosis present

## 2018-02-17 DIAGNOSIS — F419 Anxiety disorder, unspecified: Secondary | ICD-10-CM | POA: Diagnosis present

## 2018-02-17 DIAGNOSIS — Z6831 Body mass index (BMI) 31.0-31.9, adult: Secondary | ICD-10-CM

## 2018-02-17 DIAGNOSIS — K5909 Other constipation: Secondary | ICD-10-CM | POA: Diagnosis present

## 2018-02-17 DIAGNOSIS — M1990 Unspecified osteoarthritis, unspecified site: Secondary | ICD-10-CM | POA: Diagnosis present

## 2018-02-17 DIAGNOSIS — R0602 Shortness of breath: Secondary | ICD-10-CM | POA: Diagnosis not present

## 2018-02-17 DIAGNOSIS — R5381 Other malaise: Secondary | ICD-10-CM | POA: Diagnosis present

## 2018-02-17 DIAGNOSIS — G479 Sleep disorder, unspecified: Secondary | ICD-10-CM | POA: Diagnosis present

## 2018-02-17 DIAGNOSIS — Z888 Allergy status to other drugs, medicaments and biological substances status: Secondary | ICD-10-CM

## 2018-02-17 DIAGNOSIS — F32A Depression, unspecified: Secondary | ICD-10-CM | POA: Diagnosis present

## 2018-02-17 DIAGNOSIS — Z923 Personal history of irradiation: Secondary | ICD-10-CM

## 2018-02-17 DIAGNOSIS — I10 Essential (primary) hypertension: Secondary | ICD-10-CM | POA: Diagnosis present

## 2018-02-17 DIAGNOSIS — Z9981 Dependence on supplemental oxygen: Secondary | ICD-10-CM

## 2018-02-17 DIAGNOSIS — Z955 Presence of coronary angioplasty implant and graft: Secondary | ICD-10-CM

## 2018-02-17 DIAGNOSIS — G8929 Other chronic pain: Secondary | ICD-10-CM | POA: Diagnosis present

## 2018-02-17 DIAGNOSIS — Z79899 Other long term (current) drug therapy: Secondary | ICD-10-CM

## 2018-02-17 DIAGNOSIS — E785 Hyperlipidemia, unspecified: Secondary | ICD-10-CM | POA: Diagnosis present

## 2018-02-17 DIAGNOSIS — F329 Major depressive disorder, single episode, unspecified: Secondary | ICD-10-CM | POA: Diagnosis present

## 2018-02-17 LAB — CBC WITH DIFFERENTIAL/PLATELET
Basophils Absolute: 0 10*3/uL (ref 0.0–0.1)
Basophils Relative: 0 %
Eosinophils Absolute: 0.3 10*3/uL (ref 0.0–0.7)
Eosinophils Relative: 3 %
HCT: 39.5 % (ref 36.0–46.0)
Hemoglobin: 12.6 g/dL (ref 12.0–15.0)
Lymphocytes Relative: 16 %
Lymphs Abs: 1.3 10*3/uL (ref 0.7–4.0)
MCH: 30.7 pg (ref 26.0–34.0)
MCHC: 31.9 g/dL (ref 30.0–36.0)
MCV: 96.1 fL (ref 78.0–100.0)
Monocytes Absolute: 0.6 10*3/uL (ref 0.1–1.0)
Monocytes Relative: 8 %
Neutro Abs: 6 10*3/uL (ref 1.7–7.7)
Neutrophils Relative %: 73 %
Platelets: 203 10*3/uL (ref 150–400)
RBC: 4.11 MIL/uL (ref 3.87–5.11)
RDW: 12.5 % (ref 11.5–15.5)
WBC: 8.2 10*3/uL (ref 4.0–10.5)

## 2018-02-17 LAB — BASIC METABOLIC PANEL
Anion gap: 10 (ref 5–15)
BUN: 6 mg/dL (ref 6–20)
CO2: 25 mmol/L (ref 22–32)
Calcium: 8.6 mg/dL — ABNORMAL LOW (ref 8.9–10.3)
Chloride: 103 mmol/L (ref 101–111)
Creatinine, Ser: 0.93 mg/dL (ref 0.44–1.00)
GFR calc Af Amer: >60 mL/min (ref >60)
GFR calc non Af Amer: 56 mL/min — ABNORMAL LOW (ref >60)
Glucose, Bld: 123 mg/dL — ABNORMAL HIGH (ref 65–99)
Potassium: 3.8 mmol/L (ref 3.5–5.1)
Sodium: 138 mmol/L (ref 135–145)

## 2018-02-17 LAB — PROCALCITONIN: Procalcitonin: <0.1 ng/mL

## 2018-02-17 MED ORDER — ACETAMINOPHEN 325 MG PO TABS
650.0000 mg | ORAL_TABLET | Freq: Four times a day (QID) | ORAL | Status: DC | PRN
  Administered 2018-02-18 – 2018-02-21 (×8): 650 mg via ORAL
  Filled 2018-02-17 (×9): qty 2

## 2018-02-17 MED ORDER — SODIUM CHLORIDE 0.9 % IV SOLN
1.0000 g | INTRAVENOUS | Status: DC
Start: 2018-02-18 — End: 2018-02-17

## 2018-02-17 MED ORDER — AZITHROMYCIN 250 MG PO TABS
250.0000 mg | ORAL_TABLET | Freq: Every day | ORAL | Status: AC
  Administered 2018-02-18 – 2018-02-21 (×4): 250 mg via ORAL
  Filled 2018-02-17 (×4): qty 1

## 2018-02-17 MED ORDER — DOXYCYCLINE HYCLATE 100 MG PO TABS
100.0000 mg | ORAL_TABLET | Freq: Once | ORAL | Status: AC
  Administered 2018-02-17: 100 mg via ORAL
  Filled 2018-02-17: qty 1

## 2018-02-17 MED ORDER — ALBUTEROL SULFATE (2.5 MG/3ML) 0.083% IN NEBU
5.0000 mg | INHALATION_SOLUTION | Freq: Once | RESPIRATORY_TRACT | Status: AC
  Administered 2018-02-17: 5 mg via RESPIRATORY_TRACT
  Filled 2018-02-17: qty 6

## 2018-02-17 MED ORDER — FAMOTIDINE 20 MG PO TABS: 20.0000 mg | ORAL_TABLET | Freq: Every day | ORAL | Status: DC | PRN

## 2018-02-17 MED ORDER — PREDNISONE 20 MG PO TABS
40.0000 mg | ORAL_TABLET | Freq: Every day | ORAL | Status: DC
  Administered 2018-02-17 – 2018-02-21 (×4): 40 mg via ORAL
  Filled 2018-02-17 (×5): qty 2

## 2018-02-17 MED ORDER — ISOSORBIDE MONONITRATE ER 30 MG PO TB24
30.0000 mg | ORAL_TABLET | Freq: Every day | ORAL | Status: DC
  Administered 2018-02-17 – 2018-02-21 (×5): 30 mg via ORAL
  Filled 2018-02-17 (×5): qty 1

## 2018-02-17 MED ORDER — AZITHROMYCIN 250 MG PO TABS
500.0000 mg | ORAL_TABLET | Freq: Once | ORAL | Status: AC
  Administered 2018-02-17: 500 mg via ORAL
  Filled 2018-02-17: qty 2

## 2018-02-17 MED ORDER — LUBIPROSTONE 24 MCG PO CAPS
24.0000 ug | ORAL_CAPSULE | Freq: Two times a day (BID) | ORAL | Status: DC
  Administered 2018-02-18 – 2018-02-21 (×6): 24 ug via ORAL
  Filled 2018-02-17 (×9): qty 1

## 2018-02-17 MED ORDER — SODIUM CHLORIDE 0.9 % IV SOLN
1.0000 g | Freq: Once | INTRAVENOUS | Status: AC
  Administered 2018-02-17: 1 g via INTRAVENOUS
  Filled 2018-02-17: qty 10

## 2018-02-17 MED ORDER — ACETAMINOPHEN 650 MG RE SUPP: 650.0000 mg | Freq: Four times a day (QID) | RECTAL | Status: DC | PRN

## 2018-02-17 MED ORDER — DICYCLOMINE HCL 20 MG PO TABS
20.0000 mg | ORAL_TABLET | Freq: Three times a day (TID) | ORAL | Status: DC | PRN
  Administered 2018-02-18 – 2018-02-21 (×2): 20 mg via ORAL
  Filled 2018-02-17 (×3): qty 1

## 2018-02-17 MED ORDER — VITAMIN D 1000 UNITS PO TABS
1000.0000 [IU] | ORAL_TABLET | Freq: Every day | ORAL | Status: DC
  Administered 2018-02-17 – 2018-02-21 (×5): 1000 [IU] via ORAL
  Filled 2018-02-17 (×5): qty 1

## 2018-02-17 MED ORDER — PSYLLIUM 95 % PO PACK
1.0000 | PACK | Freq: Two times a day (BID) | ORAL | Status: DC | PRN
  Administered 2018-02-21: 1 via ORAL
  Filled 2018-02-17 (×3): qty 1

## 2018-02-17 MED ORDER — AMLODIPINE BESYLATE 10 MG PO TABS
10.0000 mg | ORAL_TABLET | Freq: Every day | ORAL | Status: DC
  Administered 2018-02-17 – 2018-02-21 (×5): 10 mg via ORAL
  Filled 2018-02-17: qty 1
  Filled 2018-02-17: qty 2
  Filled 2018-02-17 (×3): qty 1

## 2018-02-17 MED ORDER — IPRATROPIUM-ALBUTEROL 0.5-2.5 (3) MG/3ML IN SOLN
3.0000 mL | Freq: Four times a day (QID) | RESPIRATORY_TRACT | Status: DC
  Administered 2018-02-17 (×2): 3 mL via RESPIRATORY_TRACT
  Filled 2018-02-17 (×2): qty 3

## 2018-02-17 MED ORDER — ENOXAPARIN SODIUM 40 MG/0.4ML ~~LOC~~ SOLN
40.0000 mg | SUBCUTANEOUS | Status: DC
  Administered 2018-02-17 – 2018-02-20 (×4): 40 mg via SUBCUTANEOUS
  Filled 2018-02-17 (×5): qty 0.4

## 2018-02-17 MED ORDER — ASPIRIN EC 81 MG PO TBEC
81.0000 mg | DELAYED_RELEASE_TABLET | Freq: Every day | ORAL | Status: DC
  Administered 2018-02-17 – 2018-02-21 (×5): 81 mg via ORAL
  Filled 2018-02-17 (×5): qty 1

## 2018-02-17 MED ORDER — CARVEDILOL 3.125 MG PO TABS
3.1250 mg | ORAL_TABLET | Freq: Two times a day (BID) | ORAL | Status: DC
  Administered 2018-02-17 – 2018-02-21 (×8): 3.125 mg via ORAL
  Filled 2018-02-17 (×9): qty 1

## 2018-02-17 MED ORDER — ASPIRIN 81 MG PO TABS: 81.0000 mg | ORAL_TABLET | Freq: Every day | ORAL | Status: DC

## 2018-02-17 MED ORDER — HYDROCODONE-ACETAMINOPHEN 5-325 MG PO TABS
1.0000 | ORAL_TABLET | Freq: Once | ORAL | Status: AC
  Administered 2018-02-17: 1 via ORAL
  Filled 2018-02-17: qty 1

## 2018-02-17 MED ORDER — SODIUM CHLORIDE 0.9 % IV SOLN: 1.0000 g | INTRAVENOUS | Status: DC

## 2018-02-17 NOTE — ED Triage Notes (Signed)
Pt presents for evaluation of generalized malaise, chronic pain and shortness of breath with exertion. Pt on duoneb with EMS.

## 2018-02-17 NOTE — H&P (Addendum)
Date: 02/17/2018               Patient Name:  [name] MRN: [id number]  DOB: [birth year] Age / Sex: 83 y.o., female   PCP: Leonard Downing, MD         Medical Service: Internal Medicine Teaching Service         Attending Physician: Dr. Evette Doffing, Mallie Mussel, *    First Contact: Miachel Roux, MS4 Pager: [phone]  Second Contact: Dr. Hetty Ely Pager: [phone]       After Hours (After 5p/  First Contact Pager: [phone]  weekends / holidays): Second Contact Pager: [phone]   Chief Complaint: abdominal pain with shortness of breath  History of Present Illness: [name] is an 83 yo woman with PMH COPD (on 2L home O2), CAD with multiple stents (4 total, most recently circumflex in 2013), HTN, HLD, constipation and GERD presenting with a 3-4 day history of diffuse abdominal pain and shortness of breath. She was last seen in the ED on 11/07/17 for similar complaints and subsequently followed up with GI and pulmonology appointments.   Abdominal pain: Pt describes diffuse bloating and pain from lower chest to hips. She was seen by GI specialist in 12/2017 following positive hemoccult. GI specialist, Dr. Silverio Decamp, suspected heme positive stool likely secondary to history of diverticulosis, gastritis and duodenal erosions. She prescribed Amitiza (lubiprostone) 24 mcg bid for chronic constipation. Pt took Amitiza for 2-3 days, then experienced 3-4 days of diarrhea, so she discontinued medication. She then resumed medications for 4-5 days more. Her last BM was Sunday and was reportedly dark and loose, requiring her to strain. She denies frank blood in stool. She continues to pass flatus but states she feels she needs to pass more. She endorses nausea but denies vomiting or change in appetite. With regard to GI complaints, she has had several abdominal CTs in recent months with no identified pathology. Last colonoscopy October 2011 showed mild segmental colitis associated with severe  diverticulosis in left colon, otherwise normal.   Shortness of breath: Pt endorses worsening SOB on exertion over the past several days. She endorses aching chest pain that worsens with palpation but denies chest pain on exertion and has not needed home nitroglycerin. She denies productive cough, increased wheezing, congestion or fever. She has continued at home on 2L O2. Her COPD has been managed with Brovana and Pulmicort with albuterol as needed, but she has recently run out of albuterol. She has been adherent to other medications. Identified as GOLD B during last appointment with pulmonology. Last PFTs performed 09/2016, showing the following: FVC 1.82 (66%) FEV1 0.92 (44%) F/F 50 TLC 82% DLCO 19% Severe obstructive airway disease with broncho dilator response Severe diffusion defect  ED course: albuterol nebulizer administered. CXR findings consistent with possible basilar pneumonia, received dose of doxycycline 100 mg. CBC w/diff and BMP unremarkable.   Meds:  Current Meds  Medication Sig  . albuterol (PROVENTIL HFA;VENTOLIN HFA) 108 (90 BASE) MCG/ACT inhaler Inhale 2 puffs into the lungs every 4 (four) hours as needed for wheezing.  Marland Kitchen amLODipine (NORVASC) 10 MG tablet Take 1 tablet (10 mg total) by mouth daily.  Marland Kitchen aspirin 81 MG tablet Take 1 tablet (81 mg total) by mouth daily.  . carvedilol (COREG) 3.125 MG tablet Take 1 tablet (3.125 mg total) by mouth 2 (two) times daily with a meal.  . cholecalciferol (VITAMIN D) 1000 UNITS tablet Take 1,000 Units by mouth daily.   Marland Kitchen  dicyclomine (BENTYL) 20 MG tablet Take 1 tablet (20 mg total) by mouth 3 (three) times daily as needed for spasms. Cramping abdominal pain.  Marland Kitchen guaiFENesin (MUCINEX) 600 MG 12 hr tablet Take 1 tablet (600 mg total) by mouth 2 (two) times daily. (Patient taking differently: Take 600 mg by mouth 2 (two) times daily as needed for cough. )  . ibuprofen (ADVIL,MOTRIN) 200 MG tablet Take 400 mg by mouth every 6 (six) hours as  needed for moderate pain.  . isosorbide mononitrate (IMDUR) 30 MG 24 hr tablet Take 1 tablet (30 mg total) by mouth daily. NEED OV.  . lubiprostone (AMITIZA) 24 MCG capsule Take 1 capsule (24 mcg total) by mouth 2 (two) times daily with a meal.  . nitroGLYCERIN (NITROSTAT) 0.4 MG SL tablet Place 0.4 mg under the tongue every 5 (five) minutes as needed for chest pain.  Marland Kitchen psyllium (METAMUCIL SMOOTH TEXTURE) 28 % packet Take 1 packet by mouth 2 (two) times daily as needed (constipation).   . ranitidine (ZANTAC) 300 MG tablet Take 300 mg by mouth daily as needed for heartburn.   . trolamine salicylate (ASPERCREME) 10 % cream Apply 1 application topically daily as needed for muscle pain.     Allergies: Allergies as of 02/17/2018 - Review Complete 02/17/2018  Allergen Reaction Noted  . Iohexol Other (See Comments) 08/28/2010  . Lipitor [atorvastatin calcium] Hives 02/05/2012   Past Medical History:  Diagnosis Date  . Arthritis    "aching bones sometimes"  . Blurred vision   . CAD (coronary artery disease)    Multiple percutaneous revascularization. Catheterization May 2013 left main normal, patent LAD stent, 90% circumflex stenosis, patent right coronary artery stents. Patient had a DES to the circumflex. The EF was well-preserved.  Marland Kitchen COPD (chronic obstructive pulmonary disease) ([place])   . Fatigue   . GERD (gastroesophageal reflux disease)   . Hard of hearing   . HTN (hypertension)   . Hx of radiation therapy    right nose 4500 cGy in 10 sessions over 5 weeks (2 treatments a week)  . Hyperlipemia   . Squamous cell carcinoma of nose    right    Family History:  Family History  Problem Relation Age of Onset  . Glaucoma Father   . Heart attack Father   . Bone cancer Mother     Social History: Pt is sedentary and lives at home with her sister, whom she does not trust to be emergency contact. She prefers we speak to her daughter, [name] [phone]) in case of emergency.  Former 27.5 pack year history. Denies alcohol or illicit drugs.   Review of Systems: Review of Systems  Constitutional: Positive for malaise/fatigue. Negative for chills, fever and weight loss.  HENT: Positive for sore throat. Negative for congestion.   Eyes: Negative for blurred vision, pain and discharge.  Respiratory: Positive for shortness of breath. Negative for cough, hemoptysis, sputum production and wheezing.   Cardiovascular: Positive for chest pain. Negative for palpitations and orthopnea.  Gastrointestinal: Positive for abdominal pain, constipation, diarrhea and nausea. Negative for vomiting.  Genitourinary: Negative for dysuria, frequency and urgency.  Musculoskeletal: Positive for joint pain and myalgias.  Neurological: Positive for headaches. Negative for dizziness and loss of consciousness.    Blood pressure (!) 132/52, pulse (!) 58, temperature 98.8 F (37.1 C), temperature source Rectal, resp. rate 11, SpO2 97 %.   Physical Exam: Constitutional: Well-developed, anxious elderly woman laying in bed in mild distress; hard of hearing HEENT:  Sclerae anicteric and conjunctivae pink and moist. EOMs intact, PERRLA. Lips, teeth, and gums showed normal mucosa. The oral mucosa, hard and soft palate, tongue and posterior pharynx were normal. Cardiovascular: Bradycardic, regular rhythm, 2/6 holosystolic crescendo-decrescendo murmur best heard over RUSB, clear s1/s2 Pulmonary: Increased WOB, expiratory wheezing in upper lung fields Abdomen: Soft, mildly distended, diffusely tender to palpation with rebound tenderness, no guarding, active bowel sounds Extremities: 2+ peripheral pulses, no LE edema Neurological: CN II-XII grossly intact, strength 5/5 in all extremities Skin: Warm, dry with no rashes  EKG: personally reviewed my interpretation is sinus bradycardia with RBBB  CXR: personally reviewed my interpretation is mild patchy density indicating possible basilar  pneumonia  Assessment & Plan by Problem: Active Problems:   CAP (community acquired pneumonia)  Palmira Stickle is an 83 yo woman with PMH COPD (on 2L home O2), CAD with multiple stents, HTN, HLD, constipation and GERD presenting with a 3-4 day history of diffuse abdominal pain and shortness of breath. Her current presentation is likely multifactorial and consistent with worsening respiratory function in the setting of inadequately treated COPD (lack of albuterol inhaler), possible CAP, and mild abdominal distension.   COPD GOLD B with Exacerbation: Shortness of breath and wheezing on lung auscultation suggest COPD exacerbation. Possible inciting factors include running out of albuterol inhaler and pneumonia (see below). Differential includes MI; however, absence of chest pain with exertion and EKG unchanged from prior reduce the likelihood of an acute cardiac event. Nebulizer regimen to improve ease of breathing while investigating causative factors is warranted. - Duonebs q6h - O2 via [place], wean to home 2L as tolerated - AM BMP - Prednisone 40 mg daily   Suspected Pneumonia: Although pt has CXR findings suspicious for pneumonia, she has been afebrile, denies productive cough and has a normal WBC count. Further evaluation with procalcitonin is warranted to assess likelihood of bacterial pneumonia. Given she is at increased risk due to COPD, we will manage conservatively with CAP-directed antibiotic regimen.  - CTX 1g IV qd for a minimum of 5d - azithromycin 500 mg po x1 followed by 250 mg po qd for a minimum of 5d  - baseline calcitonin, follow q2d to guide antibiotic therapy  Abdominal Pain: Radiographic evidence of diverticular disease and history of intermittent constipation and loose stool suggest chronically increased colonic luminal pressure. Abdominal pain and distension heighten suspicion for persistent stool burden with overflow diarrhea possibly secondary to laxative use. KUB demonstrates  normal bowel gas pattern and no radiologic abnormality, reducing likelihood of partial SBO. Colonic malignancy unlikely given no evidence of mass on recent CT abdomen and no constitutional symptoms. Biliary or hepatic source of pain less likely given pain is not isolated to RUQ. Nonobstructing nephrolithiasis also noted in prior CT; however, pt describes anterior distribution of abdominal pain and no sign of renal impairment on BMP. - continue home lubiprostone and psyllium for constipation, dicyclomine for cramping - acetaminophen po or suppository prn - LFTs to assess hepatic and biliary etiologies - Urinalysis  CAD with stents: Last echo (09/2017) showed EF 60-65%. - continue home ASA, carvedilol, Imdur  GERD:  - continue home famotidine  FEN/GI: F: saline lock IV E: replete electrolytes as indicated N: HH GI: see above for constipation regimen  VTE Prophylaxis: Lovenox 40 mg SQ qd  Dispo: Admit patient to Observation with expected length of stay less than 2 midnights.  - PT evaluation   Signed: Sarina Ser, Medical Student 02/17/2018, 2:45 PM  Pager: [phone]  Attestation for Student Documentation:  I personally was present and performed or re-performed the history, physical exam and medical decision-making activities of this service and have verified that the service and findings are accurately documented in the student's note.  Shela Leff, MD 02/17/2018, 7:43 PM

## 2018-02-17 NOTE — Progress Notes (Signed)
Ceftriaxone for CAP per pharmacy ordered.  Ceftriaxone does not need renal adjustment.  [employer] policy allows pharmacy to change the ordered dose based on indication without contacting the provider.   Plan: -ceftriaxone 1g IV q24h -pharmacy to sign off as no adjustment needed.  Olamide Carattini D. Amanda Pote, PharmD, BCPS Clinical Pharmacist 02/17/2018 3:41 PM

## 2018-02-18 DIAGNOSIS — R0602 Shortness of breath: Secondary | ICD-10-CM | POA: Diagnosis present

## 2018-02-18 DIAGNOSIS — Z87891 Personal history of nicotine dependence: Secondary | ICD-10-CM | POA: Diagnosis not present

## 2018-02-18 DIAGNOSIS — Z7982 Long term (current) use of aspirin: Secondary | ICD-10-CM

## 2018-02-18 DIAGNOSIS — Z955 Presence of coronary angioplasty implant and graft: Secondary | ICD-10-CM

## 2018-02-18 DIAGNOSIS — E785 Hyperlipidemia, unspecified: Secondary | ICD-10-CM

## 2018-02-18 DIAGNOSIS — F419 Anxiety disorder, unspecified: Secondary | ICD-10-CM | POA: Diagnosis present

## 2018-02-18 DIAGNOSIS — I10 Essential (primary) hypertension: Secondary | ICD-10-CM | POA: Diagnosis present

## 2018-02-18 DIAGNOSIS — I2510 Atherosclerotic heart disease of native coronary artery without angina pectoris: Secondary | ICD-10-CM | POA: Diagnosis present

## 2018-02-18 DIAGNOSIS — R51 Headache: Secondary | ICD-10-CM

## 2018-02-18 DIAGNOSIS — H9190 Unspecified hearing loss, unspecified ear: Secondary | ICD-10-CM | POA: Diagnosis not present

## 2018-02-18 DIAGNOSIS — Z9981 Dependence on supplemental oxygen: Secondary | ICD-10-CM | POA: Diagnosis not present

## 2018-02-18 DIAGNOSIS — K219 Gastro-esophageal reflux disease without esophagitis: Secondary | ICD-10-CM | POA: Diagnosis present

## 2018-02-18 DIAGNOSIS — K210 Gastro-esophageal reflux disease with esophagitis: Secondary | ICD-10-CM | POA: Diagnosis not present

## 2018-02-18 DIAGNOSIS — G479 Sleep disorder, unspecified: Secondary | ICD-10-CM | POA: Diagnosis present

## 2018-02-18 DIAGNOSIS — Z6831 Body mass index (BMI) 31.0-31.9, adult: Secondary | ICD-10-CM | POA: Diagnosis not present

## 2018-02-18 DIAGNOSIS — T486X6A Underdosing of antiasthmatics, initial encounter: Secondary | ICD-10-CM | POA: Diagnosis present

## 2018-02-18 DIAGNOSIS — G8929 Other chronic pain: Secondary | ICD-10-CM | POA: Diagnosis present

## 2018-02-18 DIAGNOSIS — K5909 Other constipation: Secondary | ICD-10-CM | POA: Diagnosis present

## 2018-02-18 DIAGNOSIS — Z91128 Patient's intentional underdosing of medication regimen for other reason: Secondary | ICD-10-CM | POA: Diagnosis not present

## 2018-02-18 DIAGNOSIS — Z923 Personal history of irradiation: Secondary | ICD-10-CM | POA: Diagnosis not present

## 2018-02-18 DIAGNOSIS — M1990 Unspecified osteoarthritis, unspecified site: Secondary | ICD-10-CM | POA: Diagnosis present

## 2018-02-18 DIAGNOSIS — R109 Unspecified abdominal pain: Secondary | ICD-10-CM

## 2018-02-18 DIAGNOSIS — Z79899 Other long term (current) drug therapy: Secondary | ICD-10-CM

## 2018-02-18 DIAGNOSIS — H9110 Presbycusis, unspecified ear: Secondary | ICD-10-CM | POA: Diagnosis present

## 2018-02-18 DIAGNOSIS — J441 Chronic obstructive pulmonary disease with (acute) exacerbation: Secondary | ICD-10-CM | POA: Diagnosis present

## 2018-02-18 DIAGNOSIS — F329 Major depressive disorder, single episode, unspecified: Secondary | ICD-10-CM | POA: Diagnosis present

## 2018-02-18 DIAGNOSIS — J029 Acute pharyngitis, unspecified: Secondary | ICD-10-CM | POA: Diagnosis not present

## 2018-02-18 DIAGNOSIS — M549 Dorsalgia, unspecified: Secondary | ICD-10-CM | POA: Diagnosis not present

## 2018-02-18 DIAGNOSIS — R5381 Other malaise: Secondary | ICD-10-CM

## 2018-02-18 DIAGNOSIS — Z85828 Personal history of other malignant neoplasm of skin: Secondary | ICD-10-CM | POA: Diagnosis not present

## 2018-02-18 DIAGNOSIS — J9621 Acute and chronic respiratory failure with hypoxia: Secondary | ICD-10-CM | POA: Diagnosis not present

## 2018-02-18 DIAGNOSIS — L821 Other seborrheic keratosis: Secondary | ICD-10-CM | POA: Diagnosis present

## 2018-02-18 DIAGNOSIS — E669 Obesity, unspecified: Secondary | ICD-10-CM | POA: Diagnosis present

## 2018-02-18 DIAGNOSIS — K5730 Diverticulosis of large intestine without perforation or abscess without bleeding: Secondary | ICD-10-CM | POA: Diagnosis present

## 2018-02-18 LAB — COMPREHENSIVE METABOLIC PANEL
ALT: 11 U/L — AB (ref 14–54)
ANION GAP: 12 (ref 5–15)
AST: 16 U/L (ref 15–41)
Albumin: 3.4 g/dL — ABNORMAL LOW (ref 3.5–5.0)
Alkaline Phosphatase: 60 U/L (ref 38–126)
BUN: 9 mg/dL (ref 6–20)
CHLORIDE: 97 mmol/L — AB (ref 101–111)
CO2: 27 mmol/L (ref 22–32)
CREATININE: 0.94 mg/dL (ref 0.44–1.00)
Calcium: 9.1 mg/dL (ref 8.9–10.3)
GFR calc non Af Amer: 55 mL/min — ABNORMAL LOW (ref >60)
Glucose, Bld: 185 mg/dL — ABNORMAL HIGH (ref 65–99)
Potassium: 4.2 mmol/L (ref 3.5–5.1)
SODIUM: 136 mmol/L (ref 135–145)
Total Bilirubin: 0.5 mg/dL (ref 0.3–1.2)
Total Protein: 6.2 g/dL — ABNORMAL LOW (ref 6.5–8.1)

## 2018-02-18 MED ORDER — IPRATROPIUM-ALBUTEROL 0.5-2.5 (3) MG/3ML IN SOLN: 3.0000 mL | RESPIRATORY_TRACT | Status: DC

## 2018-02-18 MED ORDER — SALINE SPRAY 0.65 % NA SOLN
1.0000 | NASAL | Status: DC | PRN
  Filled 2018-02-18: qty 44

## 2018-02-18 MED ORDER — FAMOTIDINE 20 MG PO TABS
20.0000 mg | ORAL_TABLET | Freq: Every day | ORAL | Status: DC
  Administered 2018-02-18 – 2018-02-20 (×3): 20 mg via ORAL
  Filled 2018-02-18 (×3): qty 1

## 2018-02-18 MED ORDER — IPRATROPIUM-ALBUTEROL 0.5-2.5 (3) MG/3ML IN SOLN
3.0000 mL | Freq: Three times a day (TID) | RESPIRATORY_TRACT | Status: DC
  Administered 2018-02-18 (×2): 3 mL via RESPIRATORY_TRACT
  Filled 2018-02-18 (×2): qty 3

## 2018-02-18 MED ORDER — IPRATROPIUM-ALBUTEROL 0.5-2.5 (3) MG/3ML IN SOLN
3.0000 mL | RESPIRATORY_TRACT | Status: DC
  Administered 2018-02-18: 3 mL via RESPIRATORY_TRACT
  Filled 2018-02-18: qty 3

## 2018-02-18 NOTE — Progress Notes (Signed)
   Subjective: Patient feeling better today, breathing is improved.  Still with a nonproductive cough.  Lying in bed very still, has not gotten out of bed.  Oxygen turned down to 4 L/min via nasal cannula which she says is comfortable.  Usually at home she is using 2 L/min.  Objective:   Blood pressure (!) 131/59, pulse 71, temperature 97.8 F (36.6 C), temperature source Oral, resp. rate 18, height 5\' 6"  (1.676 m), weight 195 lb 15.8 oz (88.9 kg), SpO2 96 %. Gen: Chronically ill-appearing woman lying in bed, no distress. Lungs: Unlabored, mild inspiratory wheezing on the left, mild fine crackles at the bilateral bases Extremities: Warm well perfused with no lower cavity edema  A/P:   1.  Acute on chronic hypoxic respiratory failure due to COPD exacerbation: Responding well to treatment so far.  We will continue with prednisone 80 mg daily for at least 5 days.  Continue with a azithromycin for 5 days as well.  We have stopped ceftriaxone, low risk for bacterial pneumonia.  Continue with inhaled bronchodilators every 4 hours while awake.  2.  Abdominal pain: Seems to be a chronic issue and is likely near its baseline.  She been struggling with chronic constipation which I think is the most likely cause of this complaint.  Otherwise dysfunctional, she is eating and drinking well.  I agree with offering home bowel regimen which in her case includes lubiprostone.  Axel Filler, MD 02/18/2018, 4:05 PM

## 2018-02-18 NOTE — Progress Notes (Signed)
   Subjective: [name] continued to complain of malaise, abdominal pain and headache this morning. She also endorsed sore throat. Increased O2 requirement overnight,was increased from 3L to 8L O2 via [place], though nursing staff is uncertain of any precipitating event. Sats have remained >95% this afternoon with O2 weaned to 3L [place].   Objective:  Vital signs in last 24 hours: Vitals:   02/17/18 2049 02/18/18 0420 02/18/18 0904 02/18/18 1420  BP:  (!) 116/49 (!) 131/59   Pulse: 80 74 71   Resp: 18 (!) 22 18   Temp:  97.7 F (36.5 C) 97.8 F (36.6 C)   TempSrc:  Oral Oral   SpO2: 93% 99% 99% 96%  Weight:      Height:       Physical Exam General Appearance: Well developed, comfortably sitting in bed eating breakfast. Skin: Inspection of the skin reveals extensive sun damage on face and scattered seborrheic keratoses. HEENT: Sclerae anicteric and conjunctivae pink and moist. EOMs intact, PERRLA. The oral mucosa, hard and soft palate, tongue and posterior pharynx were normal. Lungs: Mildly increased WOB with faint expiratory wheezing bilaterally. Cardiovascular: Normal rate, regular rhythm, no m/r/g. Peripheral pulses 2+ and symmetric. Abdomen: Soft and diffusely tender with normal bowel sounds. No palpable HSM.  Musculoskeletal: No joint tenderness or effusions noted. Muscle strength and tone normal. Extremities: No cyanosis, clubbing or edema. Neurologic: Alert and oriented x 4. CN II-XII grossly intact.   Pertinent Labs CMP and CBC with no significant abnormality  Assessment/Plan:  Active Problems:   COPD exacerbation (HCC)   Abdominal pain   Constipation   CAP (community acquired pneumonia)  [name] is an 83 yo woman with PMH COPD (on 2L home O2), CAD with multiple stents, HTN, HLD, constipation and GERD presenting with a 3-4 day history of diffuse abdominal pain and shortness of breath. Her current presentation is likely multifactorial and consistent with worsening  respiratory function in the setting of inadequately treated COPD (lack of albuterol inhaler), abdominal pain and GERD.  COPD GOLD B with Exacerbation: Recent onset shortness of breath with increased O2 demand and wheezing on lung auscultation suggest COPD exacerbation. Bicarb WNL suggesting no significant acid-base derangement. Pneumonia unlikely given afebrile, normal WBC, and no productive cough. - Prednisone 40 mg qd x 5d - azithromycin 250 mg po qd - Duonebs q4 hours while awake  - O2 via [place], wean to home 2L, goal is to keep SpO2 at 88%-92% as noted   Abdominal pain: Likely acute on chronic constipation with no evidence of acute pathology on CT abdomen/pelvis 10/2017. 4/2 KUB showed normal bowel gas pattern and patient continues to eat and pass flatus without difficulty, making obstructive pathology unlikely. No lab results indicative of hepatic, biliary or renal pathology. - continue home lubiprostone, prn dicyclomine and psyllium - prn acetaminophen  CAD with stents: Last echo (09/2017) showed EF 60-65%. EKG consistent with prior. No signs of fluid overload on exam.  - continue home ASA, carvedilol, Imdur  GERD:  - continue home famotidine  FEN/GI: F: saline lock IV E: replete electrolytes as indicated N: HH GI: see above for constipation regimen  VTE Prophylaxis: Lovenox 40 mg SQ qd  Dispo: Anticipated discharge in approximately 1-2 day(s).  - PT consulted  Sarina Ser, Medical Student 02/18/2018, 2:42 PM Pager: [phone]

## 2018-02-18 NOTE — Evaluation (Signed)
Physical Therapy Evaluation Patient Details Name: [name] MRN: [id number] DOB: 08-09-1935 Today's Date: 02/18/2018   History of Present Illness  83 y.o. female admitted for diffuse abdominal pain and SOB. Pt found to have COPD exacerbation with CXR findings consistent with PNA. PMH significant for COPD(on 2L home O2), CAD, HTN, HLD, constipation, and GERD.  Clinical Impression  Pt presents with overall decrease in functional mobility secondary to above including impairments listed below (see PT Problem List). Bed mobility min guard and transfers and ambulation min assist with RW. Pt fatigued quickly during ambulation and had to take a standing rest break midway through 12 ft walk. SpO2 95% on 4L [place] throughout session (she was on 2L at home). Prior to admission, pt was living with her sister and required some help for ADLs and IADLs and was furniture surfing around her home with occasional use of an AD. Pt to benefit from SNF level PT to maximize safety, functional mobility, and independence. Will continue to follow acutely and progress as tolerated.      Follow Up Recommendations SNF    Equipment Recommendations  None recommended by PT    Recommendations for Other Services       Precautions / Restrictions Precautions Precautions: Fall Restrictions Weight Bearing Restrictions: No      Mobility  Bed Mobility Overal bed mobility: Needs Assistance Bed Mobility: Rolling;Sidelying to Sit Rolling: Min guard Sidelying to sit: Min guard       General bed mobility comments: pt able to roll using bed rail, elevate trunk, and bring LEs off EOB without physical assist. VCs for sequencing. HOB elevated to 30 deg.  Transfers Overall transfer level: Needs assistance Equipment used: Rolling walker (2 wheeled) Transfers: Sit to/from Stand Sit to Stand: Min assist         General transfer comment: min assist for steadying of pt and RW. increased effort and time to rise. VCs for  hand placement. sit<>stand x6 from [facility] with rest breaks in between for pericare.  Ambulation/Gait Ambulation/Gait assistance: Min assist Ambulation Distance (Feet): 12 Feet Assistive device: Rolling walker (2 wheeled) Gait Pattern/deviations: Step-through pattern;Decreased stride length;Trunk flexed;Shuffle Gait velocity: decreased Gait velocity interpretation: Below normal speed for age/gender General Gait Details: pt ambulated in room but fatigued quickly. min assist to steady pt and for management of RW. pt with slow, shuffling gait.  Stairs            Wheelchair Mobility    Modified Rankin (Stroke Patients Only)       Balance Overall balance assessment: Needs assistance Sitting-balance support: Feet supported Sitting balance-Leahy Scale: Fair     Standing balance support: Bilateral upper extremity supported;During functional activity Standing balance-Leahy Scale: Poor Standing balance comment: requires bil UE support and external support for balance                             Pertinent Vitals/Pain Pain Assessment: Faces Faces Pain Scale: Hurts a little bit Pain Location: breathing, generalized Pain Descriptors / Indicators: Aching Pain Intervention(s): Monitored during session;Repositioned;Limited activity within patient's tolerance    Home Living Family/patient expects to be discharged to:: Private residence Living Arrangements: Other relatives(sister) Available Help at Discharge: Family;Available 24 hours/day Type of Home: Mobile home Home Access: Stairs to enter Entrance Stairs-Rails: Right;Left;Can reach both Entrance Stairs-Number of Steps: 5 Home Layout: One level Home Equipment: Walker - 2 wheels;Walker - 4 wheels;Bedside commode Additional Comments: pt lives with sister  Prior Function Level of Independence: Needs assistance   Gait / Transfers Assistance Needed: furniture surfs around household and occasionally uses RW or  SPC  ADL's / Homemaking Assistance Needed: help with cooking, cleaning, and occasional help with bathing and dressing.   Comments: per pt, she barely leaves the house     Hand Dominance   Dominant Hand: Right    Extremity/Trunk Assessment   Upper Extremity Assessment Upper Extremity Assessment: Generalized weakness(tremors (pt says she has had these for a while))    Lower Extremity Assessment Lower Extremity Assessment: Generalized weakness;RLE deficits/detail;LLE deficits/detail RLE Deficits / Details: decreased proprioception LLE Deficits / Details: decreased proprioception       Communication   Communication: HOH  Cognition Arousal/Alertness: Awake/alert Behavior During Therapy: Anxious Overall Cognitive Status: Within Functional Limits for tasks assessed                                 General Comments: pt A&Ox4, anxious about getting out of bed and having BM      General Comments General comments (skin integrity, edema, etc.): ot on 4L O2 throughout session with SpO2 94% or greater throuhgout session, pt had BM (RN aware)    Exercises     Assessment/Plan    PT Assessment Patient needs continued PT services  PT Problem List Decreased strength;Decreased activity tolerance;Decreased balance;Decreased mobility;Decreased knowledge of use of DME;Decreased safety awareness;Decreased knowledge of precautions;Cardiopulmonary status limiting activity       PT Treatment Interventions DME instruction;Gait training;Stair training;Functional mobility training;Therapeutic activities;Therapeutic exercise;Balance training;Neuromuscular re-education;Patient/family education    PT Goals (Current goals can be found in the Care Plan section)  Acute Rehab PT Goals Patient Stated Goal: to go home PT Goal Formulation: With patient Time For Goal Achievement: 03/04/18 Potential to Achieve Goals: Good    Frequency Min 3X/week   Barriers to discharge Decreased  caregiver support unsure if pt's sister able to physically assist pt at home    Co-evaluation               AM-PAC PT "6 Clicks" Daily Activity  Outcome Measure Difficulty turning over in bed (including adjusting bedclothes, sheets and blankets)?: A Little Difficulty moving from lying on back to sitting on the side of the bed? : A Little Difficulty sitting down on and standing up from a chair with arms (e.g., wheelchair, bedside commode, etc,.)?: Unable Help needed moving to and from a bed to chair (including a wheelchair)?: A Little Help needed walking in hospital room?: A Little Help needed climbing 3-5 steps with a railing? : A Little 6 Click Score: 16    End of Session Equipment Utilized During Treatment: Gait belt;Oxygen Activity Tolerance: Patient tolerated treatment well;Patient limited by fatigue Patient left: in chair;with call bell/phone within reach Nurse Communication: Mobility status PT Visit Diagnosis: Unsteadiness on feet (R26.81);Repeated falls (R29.6);Muscle weakness (generalized) (M62.81);Difficulty in walking, not elsewhere classified (R26.2)    Time: 2671-2458 PT Time Calculation (min) (ACUTE ONLY): 29 min   Charges:   PT Evaluation $PT Eval Moderate Complexity: 1 Mod PT Treatments $Therapeutic Activity: 8-22 mins   PT G Codes:        Vic Ripper, SPT  Vic Ripper 02/18/2018, 10:47 AM

## 2018-02-18 NOTE — Progress Notes (Signed)
CSW aware of PT recommendation for SNF.  Patient is Medicare observation- would require 3 night inpatient stay for insurance to approve SNF stay.  CSW will continue to follow and can explain to pt and family if will not qualify for SNF or can discuss SNF placement if pt becomes appropriate for inpatient and needs 3 night stay.  Jorge Ny, LCSW Clinical Social Worker [phone]

## 2018-02-18 NOTE — Discharge Summary (Addendum)
Name: [name] MRN: [id number] DOB: 1935-06-17 83 y.o. PCP: Leonard Downing, MD  Date of Admission: 02/17/2018  8:04 AM Date of Discharge: 02/21/2018 Attending Physician: Axel Filler, *  Discharge Diagnosis Principal Problem:   COPD exacerbation [facility]) Active Problems:   Abdominal pain   Constipation   Depression   Discharge Medications: Allergies as of 02/21/2018      Reactions   Iohexol Other (See Comments)   Code:  HIVES, Desc:  PER ROBIN @ [facility], PT IS ALLERGIC TO IVP DYE 08/28/10 RM   Lipitor [atorvastatin Calcium] Hives      Medication List    STOP taking these medications   ibuprofen 200 MG tablet Commonly known as:  ADVIL,MOTRIN     TAKE these medications   albuterol 108 (90 Base) MCG/ACT inhaler Commonly known as:  PROVENTIL HFA;VENTOLIN HFA Inhale 2 puffs into the lungs every 4 (four) hours as needed for wheezing.   amLODipine 10 MG tablet Commonly known as:  NORVASC Take 1 tablet (10 mg total) by mouth daily.   arformoterol 15 MCG/2ML Nebu Commonly known as:  BROVANA Take 2 mLs (15 mcg total) by nebulization 2 (two) times daily.   aspirin 81 MG tablet Take 1 tablet (81 mg total) by mouth daily.   budesonide 0.25 MG/2ML nebulizer solution Commonly known as:  PULMICORT Take 2 mLs (0.25 mg total) by nebulization 2 (two) times daily.   carvedilol 3.125 MG tablet Commonly known as:  COREG Take 1 tablet (3.125 mg total) by mouth 2 (two) times daily with a meal.   cholecalciferol 1000 units tablet Commonly known as:  VITAMIN D Take 1,000 Units by mouth daily.   dicyclomine 20 MG tablet Commonly known as:  BENTYL Take 1 tablet (20 mg total) by mouth 3 (three) times daily as needed for spasms. Cramping abdominal pain.   docusate sodium 100 MG capsule Commonly known as:  COLACE Take 1 capsule (100 mg total) by mouth every 12 (twelve) hours.   fluticasone 50 MCG/ACT nasal spray Commonly known as:  FLONASE Place 2 sprays into both  nostrils daily as needed for allergies or rhinitis.   guaiFENesin 600 MG 12 hr tablet Commonly known as:  MUCINEX Take 1 tablet (600 mg total) by mouth 2 (two) times daily. What changed:    when to take this  reasons to take this   isosorbide mononitrate 30 MG 24 hr tablet Commonly known as:  IMDUR Take 1 tablet (30 mg total) by mouth daily. NEED OV. What changed:  Another medication with the same name was removed. Continue taking this medication, and follow the directions you see here.   lubiprostone 24 MCG capsule Commonly known as:  AMITIZA Take 1 capsule (24 mcg total) by mouth 2 (two) times daily with a meal.   nitroGLYCERIN 0.4 MG SL tablet Commonly known as:  NITROSTAT Place 0.4 mg under the tongue every 5 (five) minutes as needed for chest pain.   psyllium 28 % packet Commonly known as:  METAMUCIL SMOOTH TEXTURE Take 1 packet by mouth 2 (two) times daily as needed (constipation).   ranitidine 300 MG tablet Commonly known as:  ZANTAC Take 0.5 tablets (150 mg total) by mouth 2 (two) times daily. What changed:    how much to take  when to take this  reasons to take this   trolamine salicylate 10 % cream Commonly known as:  ASPERCREME Apply 1 application topically daily as needed for muscle pain.  Disposition and follow-up:   JenniferJennifer [name] was discharged from [facility] in Stable condition.  At the hospital follow up visit please address:  COPD - ensure adherence to home budesonide, formoterol and albuterol - consider repeat PFTs given recurrent exacerbations  Depression/Anxiety: - recommend SSRI titration and outpatient psychotherapy  Hearing Loss: - recommend audiology evaluation and hearing aid prescription  2.  Labs / imaging needed at time of follow-up: none  3.  Pending labs/ test needing follow-up: none  Follow-up Appointments: Contact information for after-discharge care    Destination    [facility] .   Service:  Skilled Nursing Contact information: [place] [phone]            Recommend follow-up following discharge from SNF with Dr. Arelia Sneddon at [facility] Course by problem list: Principal Problem:   COPD exacerbation [facility]) Active Problems:   Abdominal pain   Constipation   Depression  Depression  [name] is an 83 yo woman with PMH COPD(on 2L home O2), CAD with multiple stents, HTN, HLD, constipation and GERD who presented on 02/17/2018 with shortness of breath and acute on chronic diffuse abdominal painconsistent with COPD exacerbation and constipation.   1. COPD GOLD B withExacerbation: The patient was admitted with increased oxygen requirement (2L O2 via [place] at baseline) and subjective shortness of breath concerning for COPD exacerbation likely worsened by GERD. She was managed with a 5 day course of prednisone 40 mg po qd and azithromycin 250 mg po qd. She also received regular duonebulizer treatments. At the time of discharge, her home albuterol, Brovana and Pulmicort were resumed.   2. Constipation: KUB demonstrated normal bowel air pattern and patient tolerated PO intake and had regular bowel movements throughout hospitalization. Past workup for constipation includes CT a/p w contrast 10/2017 showing diverticulosis and no acute findings. Patient was continued on home lubiprostone, psyllium and dicyclomine.   3. Depression/Anxiety: Upon interview, the patient was tearful and endorsed symptoms of anxiety and depression. She cited poor relationships with family members, chronic pain, immobility and hearing loss as sources of stress. Chart review revealed past diagnosis of depression and prior treatment with paroxetine, diazepam, alprazolam, amitriptyline and nortriptyline. Given side effect profile, recommend SSRI prescription and titration in outpatient setting.   4. Hearing Loss: Likely  presbycusis. The patient is significantly hard of hearing, warranting audiology evaluation and prescription of hearing aids.   5. GERD: The patient was continued on home famotidine for intermittent reflux symptoms.  Discharge Vitals:   BP (!) 144/77 (BP Location: Left Arm)   Pulse 70   Temp 97.6 F (36.4 C) (Oral)   Resp 18   Ht 5\' 6"  (1.676 m)   Wt 195 lb 15.8 oz (88.9 kg)   SpO2 96%   BMI 31.63 kg/m   Pertinent Labs, Studies, and Procedures:  CBC, CMP without significant derangements on admission  4/2 KUB impression: The bowel gas pattern is normal. No radio-opaque calculi or other significant radiographic abnormality are seen.  Discharge Instructions: Current Meds  Medication Sig  . albuterol (PROVENTIL HFA;VENTOLIN HFA) 108 (90 BASE) MCG/ACT inhaler Inhale 2 puffs into the lungs every 4 (four) hours as needed for wheezing.  Marland Kitchen amLODipine (NORVASC) 10 MG tablet Take 1 tablet (10 mg total) by mouth daily.  Marland Kitchen aspirin 81 MG tablet Take 1 tablet (81 mg total) by mouth daily.  . carvedilol (COREG) 3.125 MG tablet Take 1  tablet (3.125 mg total) by mouth 2 (two) times daily with a meal.  . cholecalciferol (VITAMIN D) 1000 UNITS tablet Take 1,000 Units by mouth daily.   Marland Kitchen dicyclomine (BENTYL) 20 MG tablet Take 1 tablet (20 mg total) by mouth 3 (three) times daily as needed for spasms. Cramping abdominal pain.  Marland Kitchen guaiFENesin (MUCINEX) 600 MG 12 hr tablet Take 1 tablet (600 mg total) by mouth 2 (two) times daily. (Patient taking differently: Take 600 mg by mouth 2 (two) times daily as needed for cough. )  . ibuprofen (ADVIL,MOTRIN) 200 MG tablet Take 400 mg by mouth every 6 (six) hours as needed for moderate pain.  . isosorbide mononitrate (IMDUR) 30 MG 24 hr tablet Take 1 tablet (30 mg total) by mouth daily. NEED OV.  . lubiprostone (AMITIZA) 24 MCG capsule Take 1 capsule (24 mcg total) by mouth 2 (two) times daily with a meal.  . nitroGLYCERIN (NITROSTAT) 0.4 MG SL tablet Place 0.4 mg  under the tongue every 5 (five) minutes as needed for chest pain.  Marland Kitchen psyllium (METAMUCIL SMOOTH TEXTURE) 28 % packet Take 1 packet by mouth 2 (two) times daily as needed (constipation).   . trolamine salicylate (ASPERCREME) 10 % cream Apply 1 application topically daily as needed for muscle pain.  . [DISCONTINUED] ranitidine (ZANTAC) 300 MG tablet Take 300 mg by mouth daily as needed for heartburn.     Discharge Instructions    Diet - low sodium heart healthy   Complete by:  As directed    Discharge instructions   Complete by:  As directed    [name], [name] were hospitalized for a worsening of your COPD. You completed all of your treatments and we are glad to know you are feeling better. Please continue to take your medications.    Please call our clinic if you have any questions or concerns, we may be able to help and keep you from a long and expensive emergency room wait. Our clinic and after hours phone number is [phone], there is always someone available.   Increase activity slowly   Complete by:  As directed      Signed: Miachel Roux  02/21/2018, 9:02 AM   Pager: [phone]  Attestation for Student Documentation:  I personally was present and performed or re-performed the history, physical exam and medical decision-making activities of this service and have verified that the service and findings are accurately documented in the student's note.  Ledell Noss, MD 02/21/2018, 9:03 AM

## 2018-02-19 DIAGNOSIS — R0902 Hypoxemia: Secondary | ICD-10-CM

## 2018-02-19 DIAGNOSIS — H9190 Unspecified hearing loss, unspecified ear: Secondary | ICD-10-CM

## 2018-02-19 DIAGNOSIS — G8929 Other chronic pain: Secondary | ICD-10-CM

## 2018-02-19 DIAGNOSIS — F329 Major depressive disorder, single episode, unspecified: Secondary | ICD-10-CM

## 2018-02-19 DIAGNOSIS — M549 Dorsalgia, unspecified: Secondary | ICD-10-CM

## 2018-02-19 DIAGNOSIS — E669 Obesity, unspecified: Secondary | ICD-10-CM

## 2018-02-19 MED ORDER — IPRATROPIUM-ALBUTEROL 0.5-2.5 (3) MG/3ML IN SOLN
3.0000 mL | Freq: Three times a day (TID) | RESPIRATORY_TRACT | Status: DC
  Administered 2018-02-19 – 2018-02-21 (×7): 3 mL via RESPIRATORY_TRACT
  Filled 2018-02-19 (×8): qty 3

## 2018-02-19 MED ORDER — GI COCKTAIL ~~LOC~~
30.0000 mL | Freq: Once | ORAL | Status: AC
  Administered 2018-02-21: 30 mL via ORAL
  Filled 2018-02-19 (×3): qty 30

## 2018-02-19 MED ORDER — BUDESONIDE 0.25 MG/2ML IN SUSP
0.2500 mg | Freq: Two times a day (BID) | RESPIRATORY_TRACT | Status: DC
  Administered 2018-02-19 – 2018-02-21 (×5): 0.25 mg via RESPIRATORY_TRACT
  Filled 2018-02-19 (×5): qty 2

## 2018-02-19 NOTE — ED Provider Notes (Signed)
[facility] Provider Note   CSN: [id number] Arrival date & time: 02/17/18  0755     History   Chief Complaint Chief Complaint  Patient presents with  . Shortness of Breath    HPI [name] is a 83 y.o. female.  HPI Patient presents to the emergency department with shortness of breath and cough over the last few days along with generalized weakness.  The patient states that nothing seems to make the condition better or worse.  The patient states that she normally does require home oxygen as well.  The patient denies chest pain,  headache,blurred vision, neck pain, fever, cough, weakness, numbness, dizziness, anorexia, edema, abdominal pain, nausea, vomiting, diarrhea, rash, back pain, dysuria, hematemesis, bloody stool, near syncope, or syncope. Past Medical History:  Diagnosis Date  . Arthritis    "aching bones sometimes"  . Blurred vision   . CAD (coronary artery disease)    Multiple percutaneous revascularization. Catheterization May 2013 left main normal, patent LAD stent, 90% circumflex stenosis, patent right coronary artery stents. Patient had a DES to the circumflex. The EF was well-preserved.  Marland Kitchen COPD (chronic obstructive pulmonary disease) ([place])   . Fatigue   . GERD (gastroesophageal reflux disease)   . Hard of hearing   . HTN (hypertension)   . Hx of radiation therapy    right nose 4500 cGy in 10 sessions over 5 weeks (2 treatments a week)  . Hyperlipemia   . Squamous cell carcinoma of nose    right    Patient Active Problem List   Diagnosis Date Noted  . COPD (chronic obstructive pulmonary disease) ([place]) 10/06/2017  . Depression 10/05/2017  . Rectal pain 10/05/2017  . AP (abdominal pain)   . Atypical chest pain 11/28/2014  . Tobacco abuse 11/28/2014  . Skin cancer of nose   . Squamous cell carcinoma of skin of other and unspecified parts of face 10/15/2013  . COPD exacerbation ([place]) 01/30/2013  . Abdominal pain 01/30/2013  .  Constipation 01/30/2013  . Hypokalemia 01/30/2013  . Leukocytosis 05/14/2012  . HTN (hypertension) 05/14/2012  . Progressive angina ([place]) 05/07/2012  . Hyperlipemia   . Dysphagia, unspecified(787.20) 02/04/2012  . Esophageal reflux 02/04/2012  . Coronary atherosclerosis 08/28/2010  . ANAL OR RECTAL PAIN 08/28/2010  . NAUSEA ALONE 08/28/2010    Past Surgical History:  Procedure Laterality Date  . ABDOMINAL HYSTERECTOMY    . CORONARY ANGIOPLASTY WITH STENT PLACEMENT  2002-2009   x 5  . CORONARY ANGIOPLASTY WITH STENT PLACEMENT  05/06/12   "1; this makes 5"  . ESOPHAGEAL DILATION  ~ 2012   Dr. Deatra Ina  . LUMBAR SPINE SURGERY  04/2011   Rods, screws and cages  . PERCUTANEOUS CORONARY STENT INTERVENTION (PCI-S) N/A 05/06/2012   Procedure: PERCUTANEOUS CORONARY STENT INTERVENTION (PCI-S);  Surgeon: Burnell Blanks, MD;  [facility] C[facility] LAB;  Service: Cardiovascular;  Laterality: N/A;  . SKIN BIOPSY  10/15/13   right nose-microinvasive squamous cell carcinoma     OB History   None      Home Medications    Prior to Admission medications   Medication Sig Start Date End Date Taking? Authorizing Provider  albuterol (PROVENTIL HFA;VENTOLIN HFA) 108 (90 BASE) MCG/ACT inhaler Inhale 2 puffs into the lungs every 4 (four) hours as needed for wheezing. 12/01/14  Yes Delfina Redwood, MD  amLODipine (NORVASC) 10 MG tablet Take 1 tablet (10 mg total) by mouth daily. 10/13/17  Yes Cherene Altes,  MD  aspirin 81 MG tablet Take 1 tablet (81 mg total) by mouth daily. 10/13/17  Yes Cherene Altes, MD  carvedilol (COREG) 3.125 MG tablet Take 1 tablet (3.125 mg total) by mouth 2 (two) times daily with a meal. 10/12/17  Yes Cherene Altes, MD  cholecalciferol (VITAMIN D) 1000 UNITS tablet Take 1,000 Units by mouth daily.    Yes [provider]  dicyclomine (BENTYL) 20 MG tablet Take 1 tablet (20 mg total) by mouth 3 (three) times daily as needed for spasms. Cramping  abdominal pain. 08/16/17  Yes Charlesetta Shanks, MD  guaiFENesin (MUCINEX) 600 MG 12 hr tablet Take 1 tablet (600 mg total) by mouth 2 (two) times daily. Patient taking differently: Take 600 mg by mouth 2 (two) times daily as needed for cough.  02/03/13  Yes Kathie Dike, MD  ibuprofen (ADVIL,MOTRIN) 200 MG tablet Take 400 mg by mouth every 6 (six) hours as needed for moderate pain.   Yes [provider]  isosorbide mononitrate (IMDUR) 30 MG 24 hr tablet Take 1 tablet (30 mg total) by mouth daily. NEED OV. 01/08/18  Yes Leonie Man, MD  lubiprostone (AMITIZA) 24 MCG capsule Take 1 capsule (24 mcg total) by mouth 2 (two) times daily with a meal. 12/23/17  Yes Nandigam, Venia Minks, MD  nitroGLYCERIN (NITROSTAT) 0.4 MG SL tablet Place 0.4 mg under the tongue every 5 (five) minutes as needed for chest pain.   Yes [provider]  psyllium (METAMUCIL SMOOTH TEXTURE) 28 % packet Take 1 packet by mouth 2 (two) times daily as needed (constipation).    Yes [provider]  ranitidine (ZANTAC) 300 MG tablet Take 300 mg by mouth daily as needed for heartburn.    Yes [provider]  trolamine salicylate (ASPERCREME) 10 % cream Apply 1 application topically daily as needed for muscle pain.   Yes [provider]  arformoterol (BROVANA) 15 MCG/2ML NEBU Take 2 mLs (15 mcg total) by nebulization 2 (two) times daily. Patient not taking: Reported on 02/17/2018 11/28/17   Mannam, Hart Robinsons, MD  budesonide (PULMICORT) 0.25 MG/2ML nebulizer solution Take 2 mLs (0.25 mg total) by nebulization 2 (two) times daily. Patient not taking: Reported on 02/17/2018 11/28/17 11/28/18  Marshell Garfinkel, MD  docusate sodium (COLACE) 100 MG capsule Take 1 capsule (100 mg total) by mouth every 12 (twelve) hours. Patient not taking: Reported on 02/17/2018 08/16/17   Charlesetta Shanks, MD  fluticasone [facility]) 50 MCG/ACT nasal spray Place 2 sprays into both nostrils daily as needed for allergies or  rhinitis. Patient not taking: Reported on 02/17/2018 10/12/17   Cherene Altes, MD  isosorbide mononitrate (IMDUR) 30 MG 24 hr tablet TAKE 1 TABLET BY MOUTH ONCE DAILY Patient not taking: Reported on 02/17/2018 01/12/18   Minus Breeding, MD  calcium carbonate (OS-CAL) 600 MG TABS Take 600 mg by mouth daily.    02/04/12  [provider]  pravastatin (PRAVACHOL) 40 MG tablet Take 40 mg by mouth daily.    02/04/12  [provider]    Family History Family History  Problem Relation Age of Onset  . Glaucoma Father   . Heart attack Father   . Bone cancer Mother     Social History Social History   Tobacco Use  . Smoking status: Former Smoker    Packs/day: 0.50    Years: 55.00    Pack years: 27.50    Types: Cigarettes    Last attempt to quit: 11/28/2012  Years since quitting: 5.2  . Smokeless tobacco: Never Used  . Tobacco comment: Quit earlier this year.   Substance Use Topics  . Alcohol use: No    Alcohol/week: 0.0 oz  . Drug use: No     Allergies   Iohexol and Lipitor [atorvastatin calcium]   Review of Systems Review of Systems All other systems negative except as documented in the HPI. All pertinent positives and negatives as reviewed in the HPI. Physical Exam Updated Vital Signs BP (!) 172/87 (BP Location: Left Arm)   Pulse 88   Temp 98.7 F (37.1 C) (Oral)   Resp 18   Ht 5\' 6"  (1.676 m)   Wt 88.9 kg (195 lb 15.8 oz)   SpO2 96%   BMI 31.63 kg/m   Physical Exam  Constitutional: She is oriented to person, place, and time. She appears well-developed and well-nourished. No distress.  HENT:  Head: Normocephalic and atraumatic.  Mouth/Throat: Oropharynx is clear and moist.  Eyes: Pupils are equal, round, and reactive to light.  Neck: Normal range of motion. Neck supple.  Cardiovascular: Normal rate, regular rhythm and normal heart sounds. Exam reveals no gallop and no friction rub.  No murmur heard. Pulmonary/Chest: Effort normal. No  respiratory distress. She has no decreased breath sounds. She has wheezes. She has rhonchi. She has no rales.  Abdominal: Soft. Bowel sounds are normal. She exhibits no distension. There is no tenderness.  Neurological: She is alert and oriented to person, place, and time. She exhibits normal muscle tone. Coordination normal.  Skin: Skin is warm and dry. Capillary refill takes less than 2 seconds. No rash noted. No erythema.  Psychiatric: She has a normal mood and affect. Her behavior is normal.  Nursing note and vitals reviewed.    ED Treatments / Results  Labs (all labs ordered are listed, but only abnormal results are displayed) Labs Reviewed  BASIC METABOLIC PANEL - Abnormal; Notable for the following components:      Result Value   Glucose, Bld 123 (*)    Calcium 8.6 (*)    GFR calc non Af Amer 56 (*)    All other components within normal limits  COMPREHENSIVE METABOLIC PANEL - Abnormal; Notable for the following components:   Chloride 97 (*)    Glucose, Bld 185 (*)    Total Protein 6.2 (*)    Albumin 3.4 (*)    ALT 11 (*)    GFR calc non Af Amer 55 (*)    All other components within normal limits  CBC WITH DIFFERENTIAL/PLATELET  PROCALCITONIN    EKG EKG Interpretation  Date/Time:  Tuesday February 17 2018 08:16:13 EDT Ventricular Rate:  59 PR Interval:  172 QRS Duration: 102 QT Interval:  440 QTC Calculation: 435 R Axis:   47 Text Interpretation:  Sinus bradycardia Low voltage QRS RSR' or QR pattern in V1 suggests right ventricular conduction delay Borderline ECG since last tracing no significant change Confirmed by Malvin Johns [phone]) on 02/17/2018 11:05:01 AM   Radiology No results found.  Procedures Procedures (including critical care time)  Medications Ordered in ED Medications  azithromycin (ZITHROMAX) tablet 250 mg (250 mg Oral Given 02/19/18 1046)  isosorbide mononitrate (IMDUR) 24 hr tablet 30 mg (30 mg Oral Given 02/19/18 1047)  amLODipine (NORVASC)  tablet 10 mg (10 mg Oral Given 02/19/18 1047)  carvedilol (COREG) tablet 3.125 mg (3.125 mg Oral Given 02/19/18 1046)  cholecalciferol (VITAMIN D) tablet 1,000 Units (1,000 Units Oral Given 02/19/18 1046)  dicyclomine (  BENTYL) tablet 20 mg (20 mg Oral Given 02/18/18 1446)  lubiprostone (AMITIZA) capsule 24 mcg (24 mcg Oral Given 02/19/18 1047)  psyllium (HYDROCIL/METAMUCIL) packet 1 packet (has no administration in time range)  acetaminophen (TYLENOL) tablet 650 mg (650 mg Oral Given 02/19/18 1401)    Or  acetaminophen (TYLENOL) suppository 650 mg ( Rectal See Alternative 02/19/18 1401)  enoxaparin (LOVENOX) injection 40 mg (40 mg Subcutaneous Given 02/18/18 1742)  aspirin EC tablet 81 mg (81 mg Oral Given 02/19/18 1047)  predniSONE (DELTASONE) tablet 40 mg (40 mg Oral Given 02/19/18 0634)  famotidine (PEPCID) tablet 20 mg (20 mg Oral Given 02/18/18 2121)  sodium chloride (OCEAN) 0.65 % nasal spray 1 spray (has no administration in time range)  ipratropium-albuterol (DUONEB) 0.5-2.5 (3) MG/3ML nebulizer solution 3 mL (3 mLs Nebulization Not Given 02/19/18 1346)  gi cocktail (Maalox,Lidocaine,Donnatal) (30 mLs Oral Not Given 02/19/18 1316)  budesonide (PULMICORT) nebulizer solution 0.25 mg (0.25 mg Nebulization Given 02/19/18 1149)  HYDROcodone-acetaminophen (NORCO/VICODIN) 5-325 MG per tablet 1 tablet (1 tablet Oral Given 02/17/18 1110)  cefTRIAXone (ROCEPHIN) 1 g in sodium chloride 0.9 % 100 mL IVPB (0 g Intravenous Stopped 02/17/18 1140)  doxycycline (VIBRA-TABS) tablet 100 mg (100 mg Oral Given 02/17/18 1110)  albuterol (PROVENTIL) (2.5 MG/3ML) 0.083% nebulizer solution 5 mg (5 mg Nebulization Given 02/17/18 1320)  azithromycin (ZITHROMAX) tablet 500 mg (500 mg Oral Given 02/17/18 1706)     Initial Impression / Assessment and Plan / ED Course  I have reviewed the triage vital signs and the nursing notes.  Pertinent labs & imaging results that were available during my care of the patient were reviewed by me and considered  in my medical decision making (see chart for details).     Will need admission to the hospital for further evaluation and care was COPD and pneumonia.  The patient is evaluated and advised of the plan.  I talked with the [facility] who will admit the patient. Final Clinical Impressions(s) / ED Diagnoses   Final diagnoses:  Community acquired pneumonia, unspecified laterality  Abdominal pain    ED Discharge Orders    None       Dalia Heading, PA-C 02/19/18 1648    Malvin Johns, MD 02/26/18 2002

## 2018-02-19 NOTE — Progress Notes (Signed)
Subjective: Today, [name] states she has felt nauseated since taking prednisone. Se denies SOB at rest but states she has longstanding SOB with exertion. She was comfortable overnight on 2L O2 via [place]. She states her abdominal pain is consistent with her chronic pain and alleviated with acetaminophen. She has voided and tolerated po intake without difficulty.  Objective:  Vital signs in last 24 hours: Vitals:   02/18/18 1625 02/18/18 2009 02/18/18 2307 02/19/18 0823  BP:   (!) 108/46 (!) 172/87  Pulse:  72 71 88  Resp:  18 18 18   Temp:   98.3 F (36.8 C) 98.7 F (37.1 C)  TempSrc:   Oral Oral  SpO2: 94% 94% 97% 98%  Weight:      Height:       Physical Exam General Appearance: Well developed, laying on side in bed in mild discomfort Skin: Inspection of the skin reveals extensive sun damage on face and scattered seborrheic keratoses. Lungs: Mildly increased WOB with expiratory wheezing bilaterally. Cardiovascular: Normal rate, regular rhythm, no m/r/g. Peripheral pulses 2+ and symmetric. Abdomen: Soft and diffusely tender with normal bowel sounds. No palpable HSM.  Musculoskeletal: No joint tenderness or effusions noted. Muscle strength and tone normal. Extremities: No cyanosis, clubbing or edema. Neurologic: Alert and oriented x 4. CN II-XII grossly intact.   Assessment/Plan:  Principal Problem:   COPD exacerbation ([place]) Active Problems:   Abdominal pain   Constipation   Depression  [name] is an 83 yo woman with PMH COPD (on 2L home O2), CAD with multiple stents, HTN, HLD, constipation and GERD presenting with a 3-4 day history of diffuse abdominal pain and shortness of breath. Her current presentation is likely multifactorial and consistent with worsening respiratory function in the setting of inadequately treated COPD (lack of albuterol inhaler), constipation and GERD. She likely also suffers from longstanding depression vs anxiety that contributes to her chronic  pain.  COPD GOLD B with Exacerbation, resolving: Recent onset SOB with increased O2 demand and wheezing on auscultation suggest mild COPD exacerbation. Possibly exacerbated by poor medication adherence (ran out of home albuterol). Underlying infection unlikely given afebrile with no productive cough or elevated WBC on presentation. Condition improving with duonebulizers, prednisone and azithromycin. - Prednisone 40 mg qd x 5d (4/3-4/7) - azithromycin 250 mg po qd (4/3-4/7) - Duonebs q4 hours while awake  - continue O2 2L via [place], goal SpO2 at 88%-92%  - upon discharge: resume home albuterol, budesonide and aformoterol  Abdominal pain: Likely contributors include chronic constipation and mood disorder (see below). Diverticulosis with no evidence of acute pathology on CT abdomen/pelvis 10/2017. Obstruction ruled out by regular stooling, tolerance of PO intake and normal bowel gas pattern on KUB.  - continue home lubiprostone  - prn acetaminophen, psyllium and dicyclomine   CAD with stents: Last echo (09/2017) showed EF 60-65%. EKG consistent with prior. No signs of fluid overload on exam.  - continue home ASA, carvedilol, Imdur  GERD: Longstanding GERD with likely mild pill esophagitis following ingestion of prednisone, leading to nausea endorsed this AM. - continue home famotidine - upright posture in bed following pill administration  Depression: Pt endorses anergia, amotivation, sleep disturbance, feelings of guilt and passive suicidality consistent with depression. Denies active SI. Past treatment includes TCA, SSRI and benzodiazepines, though patient does not recall if any were effective. We would advise against TCAs due to anticholinergic effect and benzodiazepines due to risk of dependence and deliriogenic effect. She would likely benefit from trial  of SSRI and psychotherapy. - Recommend outpatient SSRI prescription and titration in conjunction with psychotherapy  Deafness: Likely  presbycusis.  - Outpatient referral to audiology or ENT to assess for hearing aids  FEN/GI: F: saline lock IV E: replete electrolytes as indicated N: HH GI: see above for constipation regimen  VTE Prophylaxis: Lovenox 40 mg SQ qd  Dispo: Anticipated discharge in approximately 2-3 day(s).  - PT recommends SNF placement, CSW aware  Sarina Ser, Medical Student 02/19/2018, 10:48 AM Pager: [phone]  Attestation for Student Documentation:  I personally was present and performed or re-performed the history, physical exam and medical decision-making activities of this service and have verified that the service and findings are accurately documented in the student's note.  Ledell Noss, MD 02/19/2018, 11:38 AM

## 2018-02-19 NOTE — NC FL2 (Signed)
[place] LEVEL OF CARE SCREENING TOOL     IDENTIFICATION  Patient Name: [name] Birthdate: [birth year] Sex: female Admission Date (Current Location): 02/17/2018  [facility] and [place] Number:  Herbalist and Address:  [facility], [place] [address] [place], [place] [address]      Provider Number: [id number]  Attending Physician Name and Address:  Axel Filler, *  Relative Name and Phone Number:       Current Level of Care: Hospital Recommended Level of Care: [place] Prior Approval Number:    Date Approved/Denied:   PASRR Number: [id number]  Discharge Plan: SNF    Current Diagnoses: Patient Active Problem List   Diagnosis Date Noted  . COPD (chronic obstructive pulmonary disease) ([place]) 10/06/2017  . Depression 10/05/2017  . Rectal pain 10/05/2017  . AP (abdominal pain)   . Atypical chest pain 11/28/2014  . Tobacco abuse 11/28/2014  . Skin cancer of nose   . Squamous cell carcinoma of skin of other and unspecified parts of face 10/15/2013  . COPD exacerbation ([place]) 01/30/2013  . Abdominal pain 01/30/2013  . Constipation 01/30/2013  . Hypokalemia 01/30/2013  . Leukocytosis 05/14/2012  . HTN (hypertension) 05/14/2012  . Progressive angina ([place]) 05/07/2012  . Hyperlipemia   . Dysphagia, unspecified(787.20) 02/04/2012  . Esophageal reflux 02/04/2012  . Coronary atherosclerosis 08/28/2010  . ANAL OR RECTAL PAIN 08/28/2010  . NAUSEA ALONE 08/28/2010    Orientation RESPIRATION BLADDER Height & Weight     Self, Time, Situation, Place  O2(2L [place]) Continent Weight: 195 lb 15.8 oz (88.9 kg) Height:  5\' 6"  (167.6 cm)  BEHAVIORAL SYMPTOMS/MOOD NEUROLOGICAL BOWEL NUTRITION STATUS      Continent Diet(cardiac)  AMBULATORY STATUS COMMUNICATION OF NEEDS Skin   Limited Assist Verbally Normal                       Personal Care Assistance Level of Assistance  Bathing, Dressing Bathing  Assistance: Limited assistance   Dressing Assistance: Limited assistance     Functional Limitations Info             SPECIAL CARE FACTORS FREQUENCY  PT (By licensed PT), OT (By licensed OT)     PT Frequency: 5/wk OT Frequency: 5/wk            Contractures      Additional Factors Info  Code Status, Allergies Code Status Info: FULL Allergies Info: Iohexol, Lipitor Atorvastatin Calcium           Current Medications (02/19/2018):  This is the current hospital active medication list Current Facility-Administered Medications  Medication Dose Route Frequency Provider Last Rate Last Dose  . acetaminophen (TYLENOL) tablet 650 mg  650 mg Oral Q6H PRN Shela Leff, MD   650 mg at 02/19/18 2831   Or  . acetaminophen (TYLENOL) suppository 650 mg  650 mg Rectal Q6H PRN Shela Leff, MD      . amLODipine (NORVASC) tablet 10 mg  10 mg Oral Daily Shela Leff, MD   10 mg at 02/19/18 1047  . aspirin EC tablet 81 mg  81 mg Oral Daily Axel Filler, MD   81 mg at 02/19/18 1047  . azithromycin (ZITHROMAX) tablet 250 mg  250 mg Oral Daily Axel Filler, MD   250 mg at 02/19/18 1046  . budesonide (PULMICORT) nebulizer solution 0.25 mg  0.25 mg Nebulization BID Ledell Noss, MD   0.25 mg at  02/19/18 1149  . carvedilol (COREG) tablet 3.125 mg  3.125 mg Oral BID WC Shela Leff, MD   3.125 mg at 02/19/18 1046  . cholecalciferol (VITAMIN D) tablet 1,000 Units  1,000 Units Oral Daily Shela Leff, MD   1,000 Units at 02/19/18 1046  . dicyclomine (BENTYL) tablet 20 mg  20 mg Oral TID PRN Shela Leff, MD   20 mg at 02/18/18 1446  . enoxaparin (LOVENOX) injection 40 mg  40 mg Subcutaneous Q24H Shela Leff, MD   40 mg at 02/18/18 1742  . famotidine (PEPCID) tablet 20 mg  20 mg Oral QHS Ledell Noss, MD   20 mg at 02/18/18 2121  . gi cocktail (Maalox,Lidocaine,Donnatal)  30 mL Oral Once Ledell Noss, MD      . ipratropium-albuterol (DUONEB)  0.5-2.5 (3) MG/3ML nebulizer solution 3 mL  3 mL Nebulization TID Axel Filler, MD   3 mL at 02/19/18 1148  . isosorbide mononitrate (IMDUR) 24 hr tablet 30 mg  30 mg Oral Daily Shela Leff, MD   30 mg at 02/19/18 1047  . lubiprostone (AMITIZA) capsule 24 mcg  24 mcg Oral BID WC Shela Leff, MD   24 mcg at 02/19/18 1047  . predniSONE (DELTASONE) tablet 40 mg  40 mg Oral Q breakfast Shela Leff, MD   40 mg at 02/19/18 0634  . psyllium (HYDROCIL/METAMUCIL) packet 1 packet  1 packet Oral BID PRN Shela Leff, MD      . sodium chloride (OCEAN) 0.65 % nasal spray 1 spray  1 spray Each Nare PRN Axel Filler, MD         Discharge Medications: Please see discharge summary for a list of discharge medications.  Relevant Imaging Results:  Relevant Lab Results:   Additional Information SS#: [id number]  Jorge Ny, LCSW

## 2018-02-19 NOTE — Clinical Social Work Note (Signed)
Clinical Social Work Assessment  Patient Details  Name: [name] MRN: [id number] Date of Birth: February 07, 1935  Date of referral:  02/19/18               Reason for consult:  Facility Placement                Permission sought to share information with:  Facility Art therapist granted to share information::     Name::        Agency::  SNFs  Relationship::     Contact Information:     Housing/Transportation Living arrangements for the past 2 months:  Single Family Home Source of Information:  Patient Patient Interpreter Needed:  None Criminal Activity/Legal Involvement Pertinent to Current Situation/Hospitalization:  No - Comment as needed Significant Relationships:  Siblings, Adult Children Lives with:  Siblings Do you feel safe going back to the place where you live?  No Need for family participation in patient care:  No (Coment)  Care giving concerns:  Pt lives at home with sister.  Sister is elderly and has braces on legs- not able to assist with physical caregiving needs.   Social Worker assessment / plan:  CSW spoke with pt concerning PT recommendation for SNF.  Explained SNF and SNF referral process.  Employment status:  Retired Forensic scientist:  Medicare PT Recommendations:  [name] / Referral to community resources:  [name]  Patient/Family's Response to care:  Pt originally uninterested in SNF but after further discussion expresses understanding that she is not well enough to return home.  Patient/Family's Understanding of and Emotional Response to Diagnosis, Current Treatment, and Prognosis:  Pt very realistic about current care needs.  Feels terrible but still managed to laugh and make jokes during conversation- has a good outlook and is hopeful to recover.  Emotional Assessment Appearance:  Appears stated age Attitude/Demeanor/Rapport:    Affect (typically observed):  Appropriate,  Pleasant Orientation:  Oriented to Self, Oriented to Place, Oriented to  Time, Oriented to Situation Alcohol / Substance use:  Not Applicable Psych involvement (Current and /or in the community):  No (Comment)  Discharge Needs  Concerns to be addressed:  Care Coordination Readmission within the last 30 days:  No Current discharge risk:  Physical Impairment Barriers to Discharge:  Continued Medical Work up   [name] Ny, LCSW 02/19/2018, 3:17 PM

## 2018-02-19 NOTE — Progress Notes (Signed)
Physical Therapy Treatment Patient Details Name: [name] MRN: [id number] DOB: [birth year] Today's Date: 02/19/2018    History of Present Illness 83 y.o. female admitted for diffuse abdominal pain and SOB. Pt found to have COPD exacerbation with CXR findings consistent with PNA. PMH significant for COPD(on 2L home O2), CAD, HTN, HLD, constipation, and GERD.    PT Comments    Pt admitted with above diagnosis. Pt currently with functional limitations due to the deficits listed below (see PT Problem List). Pt was able to ambulate with rollator with min guard assist and cues.  Does desat without O2 therefore needs 4LO2 with ambulation to keep sats >90%.  Will continue PT. Pt will benefit from skilled PT to increase their independence and safety with mobility to allow discharge to the venue listed below.     Follow Up Recommendations  SNF     Equipment Recommendations  None recommended by PT    Recommendations for Other Services       Precautions / Restrictions Precautions Precautions: Fall Restrictions Weight Bearing Restrictions: No    Mobility  Bed Mobility Overal bed mobility: Needs Assistance Bed Mobility: Rolling;Sidelying to Sit Rolling: Min guard Sidelying to sit: Min guard       General bed mobility comments: pt able to roll using bed rail, elevate trunk, and bring LEs off EOB without physical assist. VCs for sequencing. HOB elevated to 30 deg.  Transfers Overall transfer level: Needs assistance Equipment used: Rolling walker (2 wheeled) Transfers: Sit to/from Stand Sit to Stand: Min assist         General transfer comment: min assist for steadying of pt and RW. increased effort and time to rise. VCs for hand placement  Ambulation/Gait Ambulation/Gait assistance: Min assist Ambulation Distance (Feet): 150 Feet(75 feet x 2) Assistive device: 4-wheeled walker Gait Pattern/deviations: Step-through pattern;Decreased stride length;Trunk flexed;Shuffle Gait  velocity: decreased Gait velocity interpretation: Below normal speed for age/gender General Gait Details: pt ambulated with rollator with 1 seated rest break on rollator and then walked back to room.  Min guard assist for steadying.  Cannot walk without rollator without losing balance.   Fatigues quickly. min assist to steady pt and for management of RW. pt with slow, shuffling gait.   Stairs            Wheelchair Mobility    Modified Rankin (Stroke Patients Only)       Balance Overall balance assessment: Needs assistance Sitting-balance support: Feet supported Sitting balance-Leahy Scale: Fair     Standing balance support: Bilateral upper extremity supported;During functional activity Standing balance-Leahy Scale: Poor Standing balance comment: requires bil UE support and external support for balance                            Cognition Arousal/Alertness: Awake/alert Behavior During Therapy: Anxious Overall Cognitive Status: Within Functional Limits for tasks assessed                                 General Comments: pt A&Ox4, anxious about getting out of bed and having BM      Exercises      General Comments General comments (skin integrity, edema, etc.): Pt 92% on RA but does desat with activity needing 4L O2 with activity to keep sats >90%      Pertinent Vitals/Pain Pain Assessment: Faces Faces Pain Scale: Hurts a little bit Pain  Location: breathing, generalized Pain Descriptors / Indicators: Aching Pain Intervention(s): Limited activity within patient's tolerance;Monitored during session;Repositioned    Home Living                      Prior Function            PT Goals (current goals can now be found in the care plan section) Acute Rehab PT Goals Patient Stated Goal: to go home Progress towards PT goals: Progressing toward goals    Frequency    Min 3X/week      PT Plan Current plan remains appropriate     Co-evaluation              AM-PAC PT "6 Clicks" Daily Activity  Outcome Measure  Difficulty turning over in bed (including adjusting bedclothes, sheets and blankets)?: A Little Difficulty moving from lying on back to sitting on the side of the bed? : A Little Difficulty sitting down on and standing up from a chair with arms (e.g., wheelchair, bedside commode, etc,.)?: A Little Help needed moving to and from a bed to chair (including a wheelchair)?: A Little Help needed walking in hospital room?: A Little Help needed climbing 3-5 steps with a railing? : A Little 6 Click Score: 18    End of Session Equipment Utilized During Treatment: Gait belt;Oxygen Activity Tolerance: Patient limited by fatigue Patient left: with call bell/phone within reach(on 3N1 - will call nursing) Nurse Communication: Mobility status PT Visit Diagnosis: Unsteadiness on feet (R26.81);Repeated falls (R29.6);Muscle weakness (generalized) (M62.81);Difficulty in walking, not elsewhere classified (R26.2)     Time: 3785-8850 PT Time Calculation (min) (ACUTE ONLY): 17 min  Charges:  $Gait Training: 8-22 mins                    G Codes:       Rasheem Figiel,PT Acute Rehabilitation [phone] (pager)    Denice Paradise 02/19/2018, 1:43 PM

## 2018-02-20 DIAGNOSIS — K210 Gastro-esophageal reflux disease with esophagitis: Secondary | ICD-10-CM

## 2018-02-20 NOTE — Progress Notes (Signed)
Subjective: Today, [name] stated she felt sick after taking AM prednisone dose. She reported one episode of shortness of breath when eating that resolved. She reported a normal bowel movement on 4/4.   Objective:  Vital signs in last 24 hours: Vitals:   02/19/18 1738 02/19/18 2016 02/20/18 0039 02/20/18 0700  BP: (!) 164/83  138/85 (!) 143/64  Pulse: 92 68 70 63  Resp: 18 18 16 17   Temp: 98.6 F (37 C)  97.7 F (36.5 C) (!) 97.4 F (36.3 C)  TempSrc: Oral  Oral Oral  SpO2: 97% 94% 99% 98%  Weight:      Height:       Physical Exam General Appearance: Well developed, laying in bed comfortably Skin: Inspection of the skin reveals extensive sun damage on face and scattered seborrheic keratoses. Lungs: Mildly increased WOB with expiratory wheezing bilaterally. Cardiovascular: Normal rate, regular rhythm, no m/r/g. Peripheral pulses 2+ and symmetric. Abdomen: Soft and diffusely tender with normal bowel sounds.   Assessment/Plan:  Principal Problem:   COPD exacerbation ([place]) Active Problems:   Abdominal pain   Constipation   Depression  [name] is an 83 yo woman with PMH COPD (on 2L home O2), CAD with multiple stents, HTN, HLD, constipation and GERD presenting with a 3-4 day history of diffuse abdominal pain and shortness of breath. Her current presentation is likely multifactorial and consistent with worsening respiratory function in the setting of inadequately treated COPD (lack of albuterol inhaler), constipation and GERD. She likely also suffers from longstanding depression vs anxiety that contributes to her chronic pain.  COPD GOLD B with Exacerbation, resolving: Improvement in SOB with prednisone, azithromycin and duonebulizers while on home 2L suggests resolving exacerbation. Initial presentation likely related to poor medication adherence (ran out of home albuterol) and deconditioning. Infection unlikely given afebrile with no productive cough or elevated WBC during  this hospitalization.  - Prednisone 40 mg qd x 5d (4/3-4/7) - azithromycin 250 mg po qd (4/3-4/7) - Duonebs q8 hours  - continue O2 2L via [place], goal SpO2 at 88%-92%  - continue home budesonide inhaler - upon discharge: resume home albuterol, aformoterol inhalers  Abdominal pain: Likely contributors include chronic constipation and mood disorder (see below). Diverticulosis with no evidence of acute pathology on CT abdomen/pelvis 10/2017. Obstruction ruled out by regular stooling, tolerance of PO intake and normal bowel gas pattern on KUB.  - continue home lubiprostone  - prn acetaminophen, psyllium and dicyclomine   CAD with stents: Last echo (09/2017) showed EF 60-65%. EKG consistent with prior. No signs of fluid overload on exam.  - continue home ASA, carvedilol, Imdur  GERD: Longstanding GERD with likely mild pill esophagitis following ingestion of prednisone. - continue home famotidine - upright posture in bed following pill administration and mealtime  Depression: History of depression treated with TCA, SSRI and benzodiazepines per chart review. Pt endorses persistent anergia, amotivation, feelings of guilt, sleep disturbance and intermittent passive suicidality. Given favorable side effect profile, recommend SSRI administration and titration in outpatient setting.  HTN: continue home amlo 10 po qd  Deafness: Likely presbycusis.  - Outpatient referral to audiology or ENT to assess for hearing aids  FEN/GI: F: saline lock IV E: replete electrolytes as indicated N: HH GI: see above for constipation regimen  VTE Prophylaxis: Lovenox 40 mg SQ qd  Dispo: Discharge pending SNF placement.   [name], Medical Student 02/20/2018, 12:21 PM Pager: [phone]  Attestation for Student Documentation:  I personally was present  and performed or re-performed the history, physical exam and medical decision-making activities of this service and have verified that the service  and findings are accurately documented in the student's note.  Ledell Noss, MD 02/20/2018, 12:47 PM

## 2018-02-20 NOTE — Clinical Social Work Note (Signed)
CSW spoke with pt's daughter, [name]. Pt's daughter agreeable for pt to transfer to [facility] tomorrow. Pt will need PTAR.   [place], [place]

## 2018-02-21 LAB — BASIC METABOLIC PANEL
Anion gap: 10 (ref 5–15)
BUN: 22 mg/dL — ABNORMAL HIGH (ref 6–20)
CHLORIDE: 99 mmol/L — AB (ref 101–111)
CO2: 27 mmol/L (ref 22–32)
CREATININE: 1.16 mg/dL — AB (ref 0.44–1.00)
Calcium: 9.2 mg/dL (ref 8.9–10.3)
GFR calc non Af Amer: 43 mL/min — ABNORMAL LOW (ref >60)
GFR, EST AFRICAN AMERICAN: 50 mL/min — AB (ref >60)
GLUCOSE: 126 mg/dL — AB (ref 65–99)
Potassium: 4.1 mmol/L (ref 3.5–5.1)
Sodium: 136 mmol/L (ref 135–145)

## 2018-02-21 MED ORDER — RANITIDINE HCL 300 MG PO TABS: 150.0000 mg | ORAL_TABLET | Freq: Two times a day (BID) | ORAL | 0 refills | Status: DC

## 2018-02-21 NOTE — Progress Notes (Signed)
Subjective: [name] reported continued nausea after taking morning prednisone. At the time of interview, her nausea had resolved and she was comfortably eating breakfast. She reported mild abdominal pain responsive to acetaminophen and head "fuzziness". She endorsed shortness of breath with ambulation but denied wheezing or shortness of breath at rest.   Objective:  Vital signs in last 24 hours: Vitals:   02/20/18 0700 02/20/18 1700 02/20/18 1955 02/20/18 2306  BP: (!) 143/64 138/68  (!) 144/77  Pulse: 63 78 81 70  Resp: 17 19 18 18   Temp: (!) 97.4 F (36.3 C) 97.9 F (36.6 C)  97.6 F (36.4 C)  TempSrc: Oral Oral  Oral  SpO2: 98% 98% 96% 96%  Weight:      Height:       Physical Exam General Appearance: Well developed, pleasant, sitting on side of bed eating breakfast Skin: Inspection of the skin reveals extensive sun damage on face and scattered seborrheic keratoses. Lungs: No increased WOB; no wheezes or crackles Cardiovascular: Normal rate, regular rhythm, no m/r/g. Peripheral pulses 2+ and symmetric. Abdomen: Soft and diffusely tender with normal bowel sounds.   Assessment/Plan:  Principal Problem:   COPD exacerbation ([place]) Active Problems:   Abdominal pain   Constipation   Depression  [name] is an 83 yo woman with PMH COPD (on 2L home O2), CAD with multiple stents, HTN, HLD, constipation and GERD who presented 02/17/2018 with a 3-4 day history of diffuse abdominal pain and shortness of breath. The etiology of her symptoms is likely multifactorial and consistent with worsening respiratory function in the setting of inadequately treated COPD (lack of albuterol inhaler), constipation and GERD. She likely also suffers from longstanding depression vs anxiety that contributes to her chronic pain.  COPD GOLD B with Exacerbation, resolving: Improvement in SOB with prednisone, azithromycin and duonebulizers while on home 2L suggests resolving exacerbation. Initial  presentation likely related to poor medication adherence (ran out of home albuterol) and deconditioning. Infection unlikely given afebrile with no productive cough or elevated WBC during this hospitalization.  - Prednisone 40 mg qd x 5d (4/2-4/6) - azithromycin 250 mg po qd (4/2-4/6) - Duonebs q8 hours  - continue O2 2L via [place], goal SpO2 at 88%-92%  - continue home budesonide inhaler - upon discharge: resume home albuterol, aformoterol inhalers  Abdominal pain: Likely contributors include chronic constipation and mood disorder (see below). Diverticulosis with no evidence of acute pathology on CT abdomen/pelvis 10/2017. Obstruction ruled out by regular stooling, tolerance of PO intake and normal bowel gas pattern on KUB.  - continue home lubiprostone  - prn acetaminophen, psyllium and dicyclomine   CAD with stents: Last echo (09/2017) showed EF 60-65%. EKG consistent with prior. No signs of fluid overload on exam.  - continue home ASA, carvedilol, Imdur  GERD: Longstanding GERD with likely mild pill esophagitis following ingestion of prednisone. - continue home famotidine - upright posture in bed following pill administration and mealtime  Depression: History of depression treated with TCA, SSRI and benzodiazepines per chart review. Pt endorses persistent anergia, amotivation, feelings of guilt, sleep disturbance and intermittent passive suicidality. Given favorable side effect profile, recommend SSRI administration and titration in outpatient setting.  HTN: continue home amlo 10 po qd  Deafness: Likely presbycusis.  - Outpatient referral to audiology or ENT to assess for hearing aids  FEN/GI: F: saline lock IV E: replete electrolytes as indicated N: HH GI: see above for constipation regimen  VTE Prophylaxis: Lovenox 40 mg SQ qd  Dispo: Awaiting discharge to [place] today  Sarina Ser, Medical Student 02/21/2018, 8:09 AM Pager: [phone]  Attestation for Student  Documentation:  I personally was present and performed or re-performed the history, physical exam and medical decision-making activities of this service and have verified that the service and findings are accurately documented in the student's note.  Ledell Noss, MD 02/21/2018, 10:20 AM

## 2018-02-21 NOTE — Clinical Social Work Note (Signed)
Clinical Social Worker facilitated patient discharge including contacting patient family and facility to confirm patient discharge plans.  Clinical information faxed to facility and family agreeable with plan.  CSW arranged ambulance transport via PT[facility] to [facility].  RN to call report prior to discharge [phone] Room 105).  Clinical Social Worker will sign off for now as social work intervention is no longer needed. Please consult [place] again if new need arises.  Barbette Or, [place]

## 2018-02-21 NOTE — Progress Notes (Addendum)
Report called to [facility] RN at [facility]. Transportation scheduled between 2pm and 3pm  Update: 02/21/18 1515 EMS transport present for patient pick up. Report and AVS given. All patient questions answered.

## 2018-03-02 ENCOUNTER — Telehealth: Payer: Self-pay | Admitting: Pulmonary Disease

## 2018-03-02 NOTE — Telephone Encounter (Signed)
Spoke with [name] with [facility]. Pt is being released home from [facility] today. They are recommending that the pt continue with PT and OT. [facility] [place] is wanting to know if Dr. Vaughan Browner would be willing to sign for these orders.  Dr. Vaughan Browner - please advise. Thanks.

## 2018-03-04 NOTE — Telephone Encounter (Signed)
Yes I can sign

## 2018-03-16 ENCOUNTER — Other Ambulatory Visit: Payer: Self-pay

## 2018-03-16 ENCOUNTER — Emergency Department (HOSPITAL_COMMUNITY): Payer: Medicare Other

## 2018-03-16 ENCOUNTER — Emergency Department (HOSPITAL_COMMUNITY)
Admission: EM | Admit: 2018-03-16 | Discharge: 2018-03-16 | Disposition: A | Discharge status: Home / Self Care | Payer: Medicare Other | Attending: Emergency Medicine | Admitting: Emergency Medicine

## 2018-03-16 ENCOUNTER — Encounter (HOSPITAL_COMMUNITY): Payer: Self-pay

## 2018-03-16 DIAGNOSIS — Z955 Presence of coronary angioplasty implant and graft: Secondary | ICD-10-CM | POA: Insufficient documentation

## 2018-03-16 DIAGNOSIS — Z87891 Personal history of nicotine dependence: Secondary | ICD-10-CM | POA: Diagnosis not present

## 2018-03-16 DIAGNOSIS — M5416 Radiculopathy, lumbar region: Secondary | ICD-10-CM

## 2018-03-16 DIAGNOSIS — Z79899 Other long term (current) drug therapy: Secondary | ICD-10-CM | POA: Diagnosis not present

## 2018-03-16 DIAGNOSIS — K5900 Constipation, unspecified: Secondary | ICD-10-CM | POA: Insufficient documentation

## 2018-03-16 DIAGNOSIS — I2510 Atherosclerotic heart disease of native coronary artery without angina pectoris: Secondary | ICD-10-CM | POA: Insufficient documentation

## 2018-03-16 DIAGNOSIS — Z7982 Long term (current) use of aspirin: Secondary | ICD-10-CM | POA: Insufficient documentation

## 2018-03-16 DIAGNOSIS — I10 Essential (primary) hypertension: Secondary | ICD-10-CM | POA: Insufficient documentation

## 2018-03-16 DIAGNOSIS — M545 Low back pain: Secondary | ICD-10-CM | POA: Diagnosis present

## 2018-03-16 DIAGNOSIS — J449 Chronic obstructive pulmonary disease, unspecified: Secondary | ICD-10-CM | POA: Diagnosis not present

## 2018-03-16 LAB — URINALYSIS, ROUTINE W REFLEX MICROSCOPIC
BILIRUBIN URINE: NEGATIVE
Glucose, UA: NEGATIVE mg/dL
Hgb urine dipstick: NEGATIVE
Ketones, ur: NEGATIVE mg/dL
Leukocytes, UA: NEGATIVE
NITRITE: NEGATIVE
Protein, ur: NEGATIVE mg/dL
SPECIFIC GRAVITY, URINE: 1.011 (ref 1.005–1.030)
pH: 5 (ref 5.0–8.0)

## 2018-03-16 LAB — CBC WITH DIFFERENTIAL/PLATELET
BASOS ABS: 0 10*3/uL (ref 0.0–0.1)
Basophils Relative: 0 %
EOS ABS: 0.1 10*3/uL (ref 0.0–0.7)
EOS PCT: 1 %
HCT: 46.6 % — ABNORMAL HIGH (ref 36.0–46.0)
Hemoglobin: 15.3 g/dL — ABNORMAL HIGH (ref 12.0–15.0)
Lymphocytes Relative: 21 %
Lymphs Abs: 1.9 10*3/uL (ref 0.7–4.0)
MCH: 31.6 pg (ref 26.0–34.0)
MCHC: 32.8 g/dL (ref 30.0–36.0)
MCV: 96.3 fL (ref 78.0–100.0)
Monocytes Absolute: 0.7 10*3/uL (ref 0.1–1.0)
Monocytes Relative: 7 %
Neutro Abs: 6.7 10*3/uL (ref 1.7–7.7)
Neutrophils Relative %: 71 %
PLATELETS: 295 10*3/uL (ref 150–400)
RBC: 4.84 MIL/uL (ref 3.87–5.11)
RDW: 12.5 % (ref 11.5–15.5)
WBC: 9.4 10*3/uL (ref 4.0–10.5)

## 2018-03-16 LAB — I-STAT CHEM 8, ED
BUN: 16 mg/dL (ref 6–20)
CALCIUM ION: 1.25 mmol/L (ref 1.15–1.40)
CHLORIDE: 99 mmol/L — AB (ref 101–111)
Creatinine, Ser: 1.1 mg/dL — ABNORMAL HIGH (ref 0.44–1.00)
Glucose, Bld: 114 mg/dL — ABNORMAL HIGH (ref 65–99)
HCT: 49 % — ABNORMAL HIGH (ref 36.0–46.0)
Hemoglobin: 16.7 g/dL — ABNORMAL HIGH (ref 12.0–15.0)
POTASSIUM: 3.9 mmol/L (ref 3.5–5.1)
SODIUM: 139 mmol/L (ref 135–145)
TCO2: 32 mmol/L (ref 22–32)

## 2018-03-16 MED ORDER — ACETAMINOPHEN ER 650 MG PO TBCR: 650.0000 mg | EXTENDED_RELEASE_TABLET | Freq: Three times a day (TID) | ORAL | 0 refills | Status: AC | PRN

## 2018-03-16 MED ORDER — NAPROXEN 375 MG PO TABS
375.0000 mg | ORAL_TABLET | Freq: Once | ORAL | Status: AC
  Administered 2018-03-16: 375 mg via ORAL
  Filled 2018-03-16: qty 1

## 2018-03-16 MED ORDER — IBUPROFEN 400 MG PO TABS: 400.0000 mg | ORAL_TABLET | Freq: Four times a day (QID) | ORAL | 0 refills | Status: DC | PRN

## 2018-03-16 MED ORDER — ACETAMINOPHEN 325 MG PO TABS
650.0000 mg | ORAL_TABLET | Freq: Once | ORAL | Status: AC
  Administered 2018-03-16: 650 mg via ORAL
  Filled 2018-03-16: qty 2

## 2018-03-16 MED ORDER — POLYETHYLENE GLYCOL 3350 17 G PO PACK: 17.0000 g | PACK | Freq: Every day | ORAL | 0 refills | Status: DC

## 2018-03-16 NOTE — ED Notes (Addendum)
Pt has rang out multiple times for pain medicine. Pt has been informed she has nothing ordered for pain. Pt reports she is having right leg pain and wants pain medication.  Pt stated "That doctor does not know what he is talking about. He just talked to me and said it might be arthritis." RN informed pt that narcotic pain medication can cause constipation, which is one of her complaints that brought her to be seen, and also informed the pt she cannot give medicine that is not ordered as that is out of RN scope of practice.  Pt frustrated with RN at this time.  MD aware of pt request for pain medication

## 2018-03-16 NOTE — ED Notes (Signed)
ED Provider at bedside. 

## 2018-03-16 NOTE — ED Triage Notes (Addendum)
Per EMS, Pt, from home, c/o low back pain radiating down R leg x 1 week, lower abdominal pain starting today, and constipation x unknown amount of time.  Pain score 10/10.  Pt reports taking Tylenol w/ some relief.  Pt reports Hx of constipation and started taking stool softener x 2 days ago.  EMS reports Pt was ambulatory w/ walker.  NAD noted.  Sts "we couldn't get her to stand still."

## 2018-03-16 NOTE — ED Notes (Signed)
Pt discharge delayed due to patient need for oxygen at all times. Pt's ride is coming with an oxygen tank and will discharge when appropriate.

## 2018-03-16 NOTE — ED Notes (Signed)
Attempted to start an IV and get blood but patient needed to use restroom. Urine sample obtained and sent to lab

## 2018-03-16 NOTE — Discharge Instructions (Addendum)
All the results in the ER are normal.  We are not sure what is causing your symptoms. Looks like arthritis of the spine The workup in the ER is not complete, and is limited to screening for life threatening and emergent conditions only, so please see a primary care doctor for further evaluation.

## 2018-03-16 NOTE — ED Notes (Signed)
Bed: WA03 Expected date:  Expected time:  Means of arrival:  Comments: EMS- 83yo F, leg pain

## 2018-03-16 NOTE — ED Notes (Signed)
Patient transported to CT 

## 2018-03-16 NOTE — ED Notes (Signed)
Pt requesting to talk to the doctor. Phone placed at bedside to call family member

## 2018-03-17 NOTE — ED Provider Notes (Signed)
[facility] DEPT Provider Note   CSN: [id number] Arrival date & time: 03/16/18  1013     History   Chief Complaint Chief Complaint  Patient presents with  . Leg Pain  . Back Pain  . Constipation    HPI [name] is a 83 y.o. female.  HPI Patient comes in with chief complaint of neck pain and back pain.  She also reports constipation. She has hx of COPD on O2, HTN, Squamous cell CA. She reports abd pain and constipated. Pt also has new back pain on the R side of hip, with numbness to the R leg. Those symptoms are new. Pt has no associated numbness, weakness, urinary incontinence, urinary retention, bowel incontinence, pins and needle sensation in the perineal area.   Past Medical History:  Diagnosis Date  . Arthritis    "aching bones sometimes"  . Blurred vision   . CAD (coronary artery disease)    Multiple percutaneous revascularization. Catheterization May 2013 left main normal, patent LAD stent, 90% circumflex stenosis, patent right coronary artery stents. Patient had a DES to the circumflex. The EF was well-preserved.  Marland Kitchen COPD (chronic obstructive pulmonary disease) ([place])   . Fatigue   . GERD (gastroesophageal reflux disease)   . Hard of hearing   . HTN (hypertension)   . Hx of radiation therapy    right nose 4500 cGy in 10 sessions over 5 weeks (2 treatments a week)  . Hyperlipemia   . Squamous cell carcinoma of nose    right    Patient Active Problem List   Diagnosis Date Noted  . COPD (chronic obstructive pulmonary disease) ([place]) 10/06/2017  . Depression 10/05/2017  . Rectal pain 10/05/2017  . AP (abdominal pain)   . Atypical chest pain 11/28/2014  . Tobacco abuse 11/28/2014  . Skin cancer of nose   . Squamous cell carcinoma of skin of other and unspecified parts of face 10/15/2013  . COPD exacerbation ([place]) 01/30/2013  . Abdominal pain 01/30/2013  . Constipation 01/30/2013  . Hypokalemia 01/30/2013  . Leukocytosis  05/14/2012  . HTN (hypertension) 05/14/2012  . Progressive angina ([place]) 05/07/2012  . Hyperlipemia   . Dysphagia, unspecified(787.20) 02/04/2012  . Esophageal reflux 02/04/2012  . Coronary atherosclerosis 08/28/2010  . ANAL OR RECTAL PAIN 08/28/2010  . NAUSEA ALONE 08/28/2010    Past Surgical History:  Procedure Laterality Date  . ABDOMINAL HYSTERECTOMY    . CORONARY ANGIOPLASTY WITH STENT PLACEMENT  2002-2009   x 5  . CORONARY ANGIOPLASTY WITH STENT PLACEMENT  05/06/12   "1; this makes 5"  . ESOPHAGEAL DILATION  ~ 2012   Dr. Deatra Ina  . LUMBAR SPINE SURGERY  04/2011   Rods, screws and cages  . PERCUTANEOUS CORONARY STENT INTERVENTION (PCI-S) N/A 05/06/2012   Procedure: PERCUTANEOUS CORONARY STENT INTERVENTION (PCI-S);  Surgeon: Burnell Blanks, MD;  Location: [facility] CATH LAB;  Service: Cardiovascular;  Laterality: N/A;  . SKIN BIOPSY  10/15/13   right nose-microinvasive squamous cell carcinoma     OB History   None      Home Medications    Prior to Admission medications   Medication Sig Start Date End Date Taking? Authorizing Provider  albuterol (PROVENTIL HFA;VENTOLIN HFA) 108 (90 BASE) MCG/ACT inhaler Inhale 2 puffs into the lungs every 4 (four) hours as needed for wheezing. 12/01/14  Yes Delfina Redwood, MD  amLODipine (NORVASC) 10 MG tablet Take 1 tablet (10 mg total) by mouth daily. 10/13/17  Yes Joette Catching  T, MD  arformoterol (BROVANA) 15 MCG/2ML NEBU Take 2 mLs (15 mcg total) by nebulization 2 (two) times daily. 11/28/17  Yes Mannam, Praveen, MD  aspirin 81 MG tablet Take 1 tablet (81 mg total) by mouth daily. 10/13/17  Yes Cherene Altes, MD  budesonide (PULMICORT) 0.25 MG/2ML nebulizer solution Take 2 mLs (0.25 mg total) by nebulization 2 (two) times daily. 11/28/17 11/28/18 Yes Mannam, Praveen, MD  carvedilol (COREG) 3.125 MG tablet Take 1 tablet (3.125 mg total) by mouth 2 (two) times daily with a meal. 10/12/17  Yes Cherene Altes, MD    cholecalciferol (VITAMIN D) 1000 UNITS tablet Take 1,000 Units by mouth daily.    Yes [provider]  dicyclomine (BENTYL) 20 MG tablet Take 1 tablet (20 mg total) by mouth 3 (three) times daily as needed for spasms. Cramping abdominal pain. 08/16/17  Yes Charlesetta Shanks, MD  fluticasone (FLONASE) 50 MCG/ACT nasal spray Place 2 sprays into both nostrils daily as needed for allergies or rhinitis. 10/12/17  Yes Cherene Altes, MD  guaiFENesin (MUCINEX) 600 MG 12 hr tablet Take 1 tablet (600 mg total) by mouth 2 (two) times daily. Patient taking differently: Take 600 mg by mouth 2 (two) times daily as needed for cough.  02/03/13  Yes Kathie Dike, MD  isosorbide mononitrate (IMDUR) 30 MG 24 hr tablet Take 1 tablet (30 mg total) by mouth daily. NEED OV. 01/08/18  Yes Leonie Man, MD  lubiprostone (AMITIZA) 24 MCG capsule Take 1 capsule (24 mcg total) by mouth 2 (two) times daily with a meal. 12/23/17  Yes Nandigam, Venia Minks, MD  nitroGLYCERIN (NITROSTAT) 0.4 MG SL tablet Place 0.4 mg under the tongue every 5 (five) minutes as needed for chest pain.   Yes [provider]  psyllium (METAMUCIL SMOOTH TEXTURE) 28 % packet Take 1 packet by mouth 2 (two) times daily as needed (constipation).    Yes [provider]  ranitidine (ZANTAC) 300 MG tablet Take 0.5 tablets (150 mg total) by mouth 2 (two) times daily. 02/21/18  Yes Ledell Noss, MD  trolamine salicylate (ASPERCREME) 10 % cream Apply 1 application topically daily as needed for muscle pain.   Yes [provider]  acetaminophen (TYLENOL 8 HOUR) 650 MG CR tablet Take 1 tablet (650 mg total) by mouth every 8 (eight) hours as needed. 03/16/18   Varney Biles, MD  docusate sodium (COLACE) 100 MG capsule Take 1 capsule (100 mg total) by mouth every 12 (twelve) hours. Patient not taking: Reported on 03/16/2018 08/16/17   Charlesetta Shanks, MD  ibuprofen (ADVIL,MOTRIN) 400 MG tablet Take 1 tablet (400 mg total) by mouth  every 6 (six) hours as needed. 03/16/18   Varney Biles, MD  polyethylene glycol (MIRALAX) packet Take 17 g by mouth daily. 03/16/18   Varney Biles, MD  calcium carbonate (OS-CAL) 600 MG TABS Take 600 mg by mouth daily.    02/04/12  [provider]  pravastatin (PRAVACHOL) 40 MG tablet Take 40 mg by mouth daily.    02/04/12  [provider]    Family History Family History  Problem Relation Age of Onset  . Glaucoma Father   . Heart attack Father   . Bone cancer Mother     Social History Social History   Tobacco Use  . Smoking status: Former Smoker    Packs/day: 0.50    Years: 55.00    Pack years: 27.50    Types: Cigarettes    Last attempt to quit:  11/28/2012    Years since quitting: 5.3  . Smokeless tobacco: Never Used  . Tobacco comment: Quit earlier this year.   Substance Use Topics  . Alcohol use: No    Alcohol/week: 0.0 oz  . Drug use: No     Allergies   Iohexol and Lipitor [atorvastatin calcium]   Review of Systems Review of Systems  All other systems reviewed and are negative.    Physical Exam Updated Vital Signs BP (!) 137/56   Pulse (!) 59   Temp 97.9 F (36.6 C) (Oral)   Resp 18   Ht 5\' 4"  (1.626 m)   Wt 88.5 kg (195 lb)   SpO2 99%   BMI 33.47 kg/m   Physical Exam  Constitutional: She is oriented to person, place, and time. She appears well-developed.  HENT:  Head: Normocephalic and atraumatic.  Eyes: EOM are normal.  Neck: Normal range of motion. Neck supple.  Cardiovascular: Normal rate.  Pulmonary/Chest: Effort normal.  Abdominal: Soft. Bowel sounds are normal. She exhibits no mass. There is tenderness. There is no rebound and no guarding.  Musculoskeletal:  Pt has tenderness over the lumbar region No step offs, no erythema. Pt has 1-  patellar reflex bilaterally. Able to discriminate between sharp and dull. Has subjective numbness to the R side leg + passive leg raise bilaterally   Neurological: She is alert and  oriented to person, place, and time.  Skin: Skin is warm and dry.  Nursing note and vitals reviewed.    ED Treatments / Results  Labs (all labs ordered are listed, but only abnormal results are displayed) Labs Reviewed  CBC WITH DIFFERENTIAL/PLATELET - Abnormal; Notable for the following components:      Result Value   Hemoglobin 15.3 (*)    HCT 46.6 (*)    All other components within normal limits  URINALYSIS, ROUTINE W REFLEX MICROSCOPIC - Abnormal; Notable for the following components:   APPearance HAZY (*)    All other components within normal limits  I-STAT CHEM 8, ED - Abnormal; Notable for the following components:   Chloride 99 (*)    Creatinine, Ser 1.10 (*)    Glucose, Bld 114 (*)    Hemoglobin 16.7 (*)    HCT 49.0 (*)    All other components within normal limits    EKG None  Radiology Ct Abdomen Pelvis Wo Contrast  Result Date: 03/16/2018 CLINICAL DATA:  83 year old female with low back pain extending down right leg for the past week. Lower abdominal pain starting today. Constipation. Post hysterectomy. Initial encounter. EXAM: CT ABDOMEN AND PELVIS WITHOUT CONTRAST TECHNIQUE: Multidetector CT imaging of the abdomen and pelvis was performed following the standard protocol without IV contrast. COMPARISON:  Several prior exams, most recent 11/07/2017 CT. FINDINGS: Lower chest: Atelectasis/scarring and consolidation lung bases greater on right, similar to prior exam. Mild cardiomegaly. Coronary artery calcifications post stenting. Aortic valve calcification. Hepatobiliary: Taking into account limitation by non contrast imaging, no worrisome hepatic lesion. Noncalcified gallstones/gallbladder sludge. Pancreas: Taking into account limitation by non contrast imaging, no worrisome pancreatic mass or inflammation. Spleen: Taking into account limitation by non contrast imaging, no splenic mass or enlargement. Adrenals/Urinary Tract: Either nonobstructing renal calculi are vascular  calcifications. No hydronephrosis. Renal cysts appear similar to prior exam. Stable 1.6 cm left adrenal nodule. Right adrenal gland unremarkable. Noncontrast filled views the urinary bladder unremarkable. Stomach/Bowel: Diverticulosis most notable sigmoid colon. No extraluminal bowel inflammatory process. Appendix not visualized possibly removed at time of hysterectomy. No  gastric or small bowel inflammation noted. Vascular/Lymphatic: Atherosclerotic changes aorta, iliac arteries and femoral arteries as well as aortic branches. Slight ectasia of the abdominal aorta but without aneurysm. Maximal transverse dimension of the abdominal aorta 2.5 cm. No adenopathy. Reproductive: Post hysterectomy.  No adnexal mass noted. Other: No free air or bowel containing hernia. Musculoskeletal: Fusion lumbar spine. Please see lumbar spine CT report dictated separately. IMPRESSION: Diverticulosis most notable sigmoid colon without evidence of extraluminal bowel inflammatory process. Appendix not visualized possibly removed at time of hysterectomy. Gallstones. No CT evidence of gallbladder inflammation. If this were of concern ultrasound could be performed. Nonobstructing renal calculi versus vascular calcifications. No hydronephrosis. Renal cysts incompletely assessed but without significant change. Post hysterectomy. Prior fusion lumbar spine. Prominent coronary artery calcifications post stenting. Cardiomegaly. Aortic Atherosclerosis (ICD10-I70.0). Slightly ectatic abdominal aorta with maximal transverse dimension 2.5 cm. Ectatic abdominal aorta at risk for aneurysm development. Recommend followup by ultrasound in 5 years. This recommendation follows ACR consensus guidelines: White Paper of the ACR Incidental Findings Committee II on Vascular Findings. J Am Coll Radiol 2013; 10:789-794. Basilar atelectasis/scarring similar to prior exam most notable right lung base. Electronically Signed   By: Genia Del M.D.   On: 03/16/2018  15:14   Ct Lumbar Spine Wo Contrast  Result Date: 03/16/2018 CLINICAL DATA:  83 year old female with low back pain extending down right leg for the past week. Lower abdominal pain starting today. Constipation. Post hysterectomy. Initial encounter. EXAM: CT LUMBAR SPINE WITHOUT CONTRAST TECHNIQUE: Multidetector CT imaging of the lumbar spine was performed without intravenous contrast administration. Multiplanar CT image reconstructions were also generated. COMPARISON:  None. FINDINGS: Segmentation: Last fully open disc space labeled L5-S1. Current exam includes from upper T11-S2 level. Alignment: Minimal curvature. Vertebrae: No compression fracture. Paraspinal and other soft tissues: Atherosclerotic changes aorta. Disc levels: T11-12: Prominent disc degeneration and endplate reactive changes/subchondral cysts. Bulge and spur. Facet degenerative changes. Mild spinal stenosis. T12-L1: Prominent disc degeneration with endplate reactive changes/subchondral cysts. Bulge and spur. Facet degenerative changes. Mild spinal stenosis. L1-2: Prior fusion. Bilateral pedicle screws L1 level and on the left at the L2 level. No significant spinal stenosis or foraminal narrowing. L2-3: Prior fusion. Left-sided L2 pedicle screw and bilateral L3 pedicle screws. No significant spinal stenosis or foraminal narrowing. L3-4: Prior fusion. Bilateral pedicle screws. Mild spur. Mild spinal stenosis. L4-5: Prior fusion with bilateral pedicle screws. Partial fusion across the disc space. Spur. Facet bony overgrowth. Mild right-sided and moderate left-sided foraminal narrowing. L5-S1: Facet degenerative changes with bony overgrowth greater on the left. Minimal anterior slip L5. Mild uncovering of the disc. Mild to moderate right-sided and mild left-sided foraminal. Moderate bilateral lateral recess narrowing. IMPRESSION: Prior fusion L1-L5. No acute compression fracture. Mild spinal stenosis T11-12, T12-L1 and L3-4 level. L4-5 mild  right-sided and moderate left-sided foraminal narrowing. L5-S1 mild to moderate right-sided and mild left-sided foraminal. Moderate bilateral lateral recess narrowing. Aortic Atherosclerosis (ICD10-I70.0). Electronically Signed   By: Genia Del M.D.   On: 03/16/2018 15:27    Procedures Procedures (including critical care time)  Medications Ordered in ED Medications  acetaminophen (TYLENOL) tablet 650 mg (650 mg Oral Given 03/16/18 1550)  naproxen (NAPROSYN) tablet 375 mg (375 mg Oral Given 03/16/18 1550)     Initial Impression / Assessment and Plan / ED Course  I have reviewed the triage vital signs and the nursing notes.  Pertinent labs & imaging results that were available during my care of the patient were reviewed by me and  considered in my medical decision making (see chart for details).     83 year old female comes in with chief complaint of back pain, abdominal pain, right lower extremity numbness.  Patient is also having constipation.  She has a history of cancer.  Numbness in the lower extremities new.  No red flags on her history to suggest cord compression.  Patient has history of squamous cell carcinoma therefore cord compression or metastatic lesion to the spinal cord considered.  Patient is also complaining of constipation and generalized abdominal pain.  Her abdominal exam does not reveal any peritoneal findings.  CT abdomen and pelvis along with CT lumbar spine ordered.   Based on the work-up in the ED, it seems like patient is likely having sciatica and constipation as 2 separate process.  She has been advised to treat the sciatica/lumbar radiculopathy conservatively and see her PCP.  Strict ER return precautions have been discussed as well with the patient.  Final Clinical Impressions(s) / ED Diagnoses   Final diagnoses:  Constipation, unspecified constipation type  Lumbar radiculopathy, acute    ED Discharge Orders        Ordered    ibuprofen (ADVIL,MOTRIN)  400 MG tablet  Every 6 hours PRN     03/16/18 1543    acetaminophen (TYLENOL 8 HOUR) 650 MG CR tablet  Every 8 hours PRN     03/16/18 1543    polyethylene glycol (MIRALAX) packet  Daily     03/16/18 [place], Corina Stacy, MD 03/17/18 [phone]

## 2018-03-26 NOTE — Progress Notes (Signed)
HP The patient presents for follow up of CAD. She's had a history of coronary disease with multiple stents.   Since I last saw her she was in the hospital with COPD.  This was in April.  I did review the hospital records for this.  I also reviewed an ER report from late April when she had constipation and a CT of her abdomen.  She again has many somatic complaints.  She hurts all over.  He does complain of chest discomfort but then points to an upper epigastric area she is not sure whether this is similar to previous angina.  Her blood pressure is elevated.  2 L of home oxygen.  Has significant dyspnea with exertion which is baseline.   Allergies  Allergen Reactions  . Iohexol Other (See Comments)    Code:  HIVES, Desc:  PER ROBIN @ [facility], PT IS ALLERGIC TO IVP DYE 08/28/10 RM  . Lipitor [Atorvastatin Calcium] Hives    Current Outpatient Medications  Medication Sig Dispense Refill  . acetaminophen (TYLENOL 8 HOUR) 650 MG CR tablet Take 1 tablet (650 mg total) by mouth every 8 (eight) hours as needed. 30 tablet 0  . albuterol (PROVENTIL HFA;VENTOLIN HFA) 108 (90 BASE) MCG/ACT inhaler Inhale 2 puffs into the lungs every 4 (four) hours as needed for wheezing. 1 Inhaler 0  . amLODipine (NORVASC) 10 MG tablet Take 1 tablet (10 mg total) by mouth daily. 30 tablet 0  . arformoterol (BROVANA) 15 MCG/2ML NEBU Take 2 mLs (15 mcg total) by nebulization 2 (two) times daily. 120 mL 0  . aspirin 81 MG tablet Take 1 tablet (81 mg total) by mouth daily.    . budesonide (PULMICORT) 0.25 MG/2ML nebulizer solution Take 2 mLs (0.25 mg total) by nebulization 2 (two) times daily. 120 mL 11  . carvedilol (COREG) 3.125 MG tablet Take 1 tablet (3.125 mg total) by mouth 2 (two) times daily with a meal. 60 tablet 0  . cholecalciferol (VITAMIN D) 1000 UNITS tablet Take 1,000 Units by mouth daily.     Marland Kitchen dicyclomine (BENTYL) 20 MG tablet Take 1 tablet (20 mg total) by mouth 3 (three) times daily as needed for spasms.  Cramping abdominal pain. 30 tablet 0  . docusate sodium (COLACE) 100 MG capsule Take 1 capsule (100 mg total) by mouth every 12 (twelve) hours. 60 capsule 2  . fluticasone (FLONASE) 50 MCG/ACT nasal spray Place 2 sprays into both nostrils daily as needed for allergies or rhinitis. 16 g 0  . guaiFENesin (MUCINEX) 600 MG 12 hr tablet Take 1 tablet (600 mg total) by mouth 2 (two) times daily. (Patient taking differently: Take 600 mg by mouth 2 (two) times daily as needed for cough. ) 14 tablet 0  . ibuprofen (ADVIL,MOTRIN) 400 MG tablet Take 1 tablet (400 mg total) by mouth every 6 (six) hours as needed. 30 tablet 0  . isosorbide mononitrate (IMDUR) 120 MG 24 hr tablet Take 1 tablet (120 mg total) by mouth daily. 30 tablet 11  . lubiprostone (AMITIZA) 24 MCG capsule Take 1 capsule (24 mcg total) by mouth 2 (two) times daily with a meal. 60 capsule 3  . nitroGLYCERIN (NITROSTAT) 0.4 MG SL tablet Place 0.4 mg under the tongue every 5 (five) minutes as needed for chest pain.    . polyethylene glycol (MIRALAX) packet Take 17 g by mouth daily. 14 each 0  . psyllium (METAMUCIL SMOOTH TEXTURE) 28 % packet Take 1 packet by mouth 2 (two)  times daily as needed (constipation).     . ranitidine (ZANTAC) 300 MG tablet Take 0.5 tablets (150 mg total) by mouth 2 (two) times daily. 60 tablet 0  . trolamine salicylate (ASPERCREME) 10 % cream Apply 1 application topically daily as needed for muscle pain.     No current facility-administered medications for this visit.     Past Medical History:  Diagnosis Date  . Arthritis    "aching bones sometimes"  . Blurred vision   . CAD (coronary artery disease)    Multiple percutaneous revascularization. Catheterization May 2013 left main normal, patent LAD stent, 90% circumflex stenosis, patent right coronary artery stents. Patient had a DES to the circumflex. The EF was well-preserved.  Marland Kitchen COPD (chronic obstructive pulmonary disease) ([place])   . Fatigue   . GERD  (gastroesophageal reflux disease)   . Hard of hearing   . HTN (hypertension)   . Hx of radiation therapy    right nose 4500 cGy in 10 sessions over 5 weeks (2 treatments a week)  . Hyperlipemia   . Squamous cell carcinoma of nose    right    Past Surgical History:  Procedure Laterality Date  . ABDOMINAL HYSTERECTOMY    . CORONARY ANGIOPLASTY WITH STENT PLACEMENT  2002-2009   x 5  . CORONARY ANGIOPLASTY WITH STENT PLACEMENT  05/06/12   "1; this makes 5"  . ESOPHAGEAL DILATION  ~ 2012   Dr. Deatra Ina  . LUMBAR SPINE SURGERY  04/2011   Rods, screws and cages  . PERCUTANEOUS CORONARY STENT INTERVENTION (PCI-S) N/A 05/06/2012   Procedure: PERCUTANEOUS CORONARY STENT INTERVENTION (PCI-S);  Surgeon: Burnell Blanks, MD;  Location: [facility] CATH LAB;  Service: Cardiovascular;  Laterality: N/A;  . SKIN BIOPSY  10/15/13   right nose-microinvasive squamous cell carcinoma    ROS:  Positive for constipation and HOH.   Otherwise as stated in the HPI and negative for all other systems.   PHYSICAL EXAM BP (!) 178/85   Pulse 77   Ht 5\' 4"  (1.626 m)   Wt 196 lb (88.9 kg)   SpO2 95%   BMI 33.64 kg/m   GENERAL:  Well appearing NECK:  No jugular venous distention, waveform within normal limits, carotid upstroke brisk and symmetric, no bruits, no thyromegaly LUNGS:  Decreased breath sounds bilaterally.   CHEST:  Unremarkable HEART:  PMI not displaced or sustained,S1 and S2 within normal limits, no S3, no S4, no clicks, no rubs, NO murmurs ABD:  Flat, positive bowel sounds normal in frequency in pitch, no bruits, no rebound, no guarding, no midline pulsatile mass, no hepatomegaly, no splenomegaly EXT:  2 plus pulses throughout, no edema, no cyanosis no clubbing    EKG:  NA    ASSESSMENT AND PLAN   DYSPNEA - I think this is multifactorial.  There is a small possibility that this could be anginal equivalent.  We will increase her Imdur to 120 mg daily.  Check a BNP.  CAD -  The patient  had a negative stress perfusion study in Jan 2016.  I will manage this medically as above.  Hyperlipemia -  Her last LDL was 94 with and HDL of 47.  She will continue meds as listed.   HTN (hypertension) -  The blood pressure is elevated.  I will increase the Imdur  TOBACCO ABUSE -  She has not been smoking.

## 2018-03-27 ENCOUNTER — Ambulatory Visit (INDEPENDENT_AMBULATORY_CARE_PROVIDER_SITE_OTHER): Payer: Medicare Other | Admitting: Cardiology

## 2018-03-27 ENCOUNTER — Encounter: Payer: Self-pay | Admitting: Cardiology

## 2018-03-27 VITALS — BP 178/85 | HR 77 | Ht 64.0 in | Wt 196.0 lb

## 2018-03-27 DIAGNOSIS — E785 Hyperlipidemia, unspecified: Secondary | ICD-10-CM

## 2018-03-27 DIAGNOSIS — I10 Essential (primary) hypertension: Secondary | ICD-10-CM

## 2018-03-27 DIAGNOSIS — R0602 Shortness of breath: Secondary | ICD-10-CM

## 2018-03-27 DIAGNOSIS — Z79899 Other long term (current) drug therapy: Secondary | ICD-10-CM | POA: Diagnosis not present

## 2018-03-27 DIAGNOSIS — I25119 Atherosclerotic heart disease of native coronary artery with unspecified angina pectoris: Secondary | ICD-10-CM | POA: Diagnosis not present

## 2018-03-27 MED ORDER — ISOSORBIDE MONONITRATE ER 120 MG PO TB24: 120.0000 mg | ORAL_TABLET | Freq: Every day | ORAL | 11 refills | Status: DC

## 2018-03-27 NOTE — Patient Instructions (Signed)
Medication Instructions:  INCREASE- Isosorbide 120 mg daily  If you need a refill on your cardiac medications before your next appointment, please call your pharmacy.  Labwork: BMP Today  Testing/Procedures: None Ordered   Follow-Up: Your physician wants you to follow-up in: 1 Year You should receive a reminder letter in the mail two months in advance. If you do not receive a letter, please call our office [phone].      Thank you for choosing [facility]!!

## 2018-03-28 LAB — BASIC METABOLIC PANEL
BUN/Creatinine Ratio: 15 (ref 12–28)
BUN: 15 mg/dL (ref 8–27)
CALCIUM: 10.4 mg/dL — AB (ref 8.7–10.3)
CHLORIDE: 97 mmol/L (ref 96–106)
CO2: 28 mmol/L (ref 20–29)
Creatinine, Ser: 0.99 mg/dL (ref 0.57–1.00)
GFR calc Af Amer: 62 mL/min/{1.73_m2} (ref >59)
GFR calc non Af Amer: 54 mL/min/{1.73_m2} — ABNORMAL LOW (ref >59)
GLUCOSE: 94 mg/dL (ref 65–99)
POTASSIUM: 4.7 mmol/L (ref 3.5–5.2)
Sodium: 142 mmol/L (ref 134–144)

## 2018-04-14 ENCOUNTER — Inpatient Hospital Stay (HOSPITAL_COMMUNITY)
Admission: EM | Admit: 2018-04-14 | Discharge: 2018-04-19 | DRG: 190 | Disposition: A | Discharge status: Home / Self Care | Payer: Medicare Other | Attending: Internal Medicine | Admitting: Internal Medicine

## 2018-04-14 ENCOUNTER — Encounter (HOSPITAL_COMMUNITY): Payer: Self-pay | Admitting: Emergency Medicine

## 2018-04-14 ENCOUNTER — Emergency Department (HOSPITAL_COMMUNITY): Payer: Medicare Other

## 2018-04-14 DIAGNOSIS — R10A1 Flank pain, right side: Secondary | ICD-10-CM | POA: Diagnosis present

## 2018-04-14 DIAGNOSIS — N200 Calculus of kidney: Secondary | ICD-10-CM | POA: Diagnosis present

## 2018-04-14 DIAGNOSIS — Z83511 Family history of glaucoma: Secondary | ICD-10-CM | POA: Diagnosis not present

## 2018-04-14 DIAGNOSIS — Z7982 Long term (current) use of aspirin: Secondary | ICD-10-CM | POA: Diagnosis not present

## 2018-04-14 DIAGNOSIS — Z90710 Acquired absence of both cervix and uterus: Secondary | ICD-10-CM | POA: Diagnosis not present

## 2018-04-14 DIAGNOSIS — F329 Major depressive disorder, single episode, unspecified: Secondary | ICD-10-CM | POA: Diagnosis present

## 2018-04-14 DIAGNOSIS — E785 Hyperlipidemia, unspecified: Secondary | ICD-10-CM | POA: Diagnosis present

## 2018-04-14 DIAGNOSIS — M545 Low back pain: Secondary | ICD-10-CM

## 2018-04-14 DIAGNOSIS — Z888 Allergy status to other drugs, medicaments and biological substances status: Secondary | ICD-10-CM

## 2018-04-14 DIAGNOSIS — Z923 Personal history of irradiation: Secondary | ICD-10-CM | POA: Diagnosis not present

## 2018-04-14 DIAGNOSIS — I739 Peripheral vascular disease, unspecified: Secondary | ICD-10-CM | POA: Diagnosis present

## 2018-04-14 DIAGNOSIS — Z85828 Personal history of other malignant neoplasm of skin: Secondary | ICD-10-CM

## 2018-04-14 DIAGNOSIS — Z955 Presence of coronary angioplasty implant and graft: Secondary | ICD-10-CM

## 2018-04-14 DIAGNOSIS — H9190 Unspecified hearing loss, unspecified ear: Secondary | ICD-10-CM | POA: Diagnosis present

## 2018-04-14 DIAGNOSIS — I10 Essential (primary) hypertension: Secondary | ICD-10-CM | POA: Diagnosis not present

## 2018-04-14 DIAGNOSIS — K5730 Diverticulosis of large intestine without perforation or abscess without bleeding: Secondary | ICD-10-CM | POA: Diagnosis present

## 2018-04-14 DIAGNOSIS — R109 Unspecified abdominal pain: Secondary | ICD-10-CM | POA: Diagnosis not present

## 2018-04-14 DIAGNOSIS — J9621 Acute and chronic respiratory failure with hypoxia: Secondary | ICD-10-CM | POA: Diagnosis present

## 2018-04-14 DIAGNOSIS — M1990 Unspecified osteoarthritis, unspecified site: Secondary | ICD-10-CM | POA: Diagnosis present

## 2018-04-14 DIAGNOSIS — K429 Umbilical hernia without obstruction or gangrene: Secondary | ICD-10-CM | POA: Diagnosis present

## 2018-04-14 DIAGNOSIS — Z8249 Family history of ischemic heart disease and other diseases of the circulatory system: Secondary | ICD-10-CM

## 2018-04-14 DIAGNOSIS — M549 Dorsalgia, unspecified: Secondary | ICD-10-CM | POA: Diagnosis present

## 2018-04-14 DIAGNOSIS — K5900 Constipation, unspecified: Secondary | ICD-10-CM | POA: Diagnosis present

## 2018-04-14 DIAGNOSIS — R200 Anesthesia of skin: Secondary | ICD-10-CM | POA: Diagnosis present

## 2018-04-14 DIAGNOSIS — J441 Chronic obstructive pulmonary disease with (acute) exacerbation: Secondary | ICD-10-CM | POA: Diagnosis not present

## 2018-04-14 DIAGNOSIS — K219 Gastro-esophageal reflux disease without esophagitis: Secondary | ICD-10-CM | POA: Diagnosis not present

## 2018-04-14 DIAGNOSIS — I2510 Atherosclerotic heart disease of native coronary artery without angina pectoris: Secondary | ICD-10-CM | POA: Diagnosis present

## 2018-04-14 DIAGNOSIS — Z91041 Radiographic dye allergy status: Secondary | ICD-10-CM | POA: Diagnosis not present

## 2018-04-14 DIAGNOSIS — Z87891 Personal history of nicotine dependence: Secondary | ICD-10-CM

## 2018-04-14 DIAGNOSIS — F32A Depression, unspecified: Secondary | ICD-10-CM | POA: Diagnosis present

## 2018-04-14 HISTORY — DX: Low back pain: M54.5

## 2018-04-14 HISTORY — DX: Low back pain, unspecified: M54.50

## 2018-04-14 HISTORY — DX: Dependence on supplemental oxygen: Z99.81

## 2018-04-14 HISTORY — DX: Other chronic pain: G89.29

## 2018-04-14 HISTORY — DX: Pneumonia, unspecified organism: J18.9

## 2018-04-14 LAB — URINALYSIS, ROUTINE W REFLEX MICROSCOPIC
Bilirubin Urine: NEGATIVE
GLUCOSE, UA: NEGATIVE mg/dL
HGB URINE DIPSTICK: NEGATIVE
Ketones, ur: NEGATIVE mg/dL
Leukocytes, UA: NEGATIVE
Nitrite: NEGATIVE
PH: 6 (ref 5.0–8.0)
PROTEIN: NEGATIVE mg/dL
Specific Gravity, Urine: 1.011 (ref 1.005–1.030)

## 2018-04-14 LAB — LIPASE, BLOOD: Lipase: 34 U/L (ref 11–51)

## 2018-04-14 LAB — COMPREHENSIVE METABOLIC PANEL WITH GFR
ALT: 14 U/L (ref 14–54)
AST: 18 U/L (ref 15–41)
Albumin: 3.8 g/dL (ref 3.5–5.0)
Alkaline Phosphatase: 54 U/L (ref 38–126)
Anion gap: 8 (ref 5–15)
BUN: 12 mg/dL (ref 6–20)
CO2: 28 mmol/L (ref 22–32)
Calcium: 9.4 mg/dL (ref 8.9–10.3)
Chloride: 103 mmol/L (ref 101–111)
Creatinine, Ser: 1.06 mg/dL — ABNORMAL HIGH (ref 0.44–1.00)
GFR calc Af Amer: 56 mL/min — ABNORMAL LOW (ref >60)
GFR calc non Af Amer: 48 mL/min — ABNORMAL LOW (ref >60)
Glucose, Bld: 119 mg/dL — ABNORMAL HIGH (ref 65–99)
Potassium: 3.9 mmol/L (ref 3.5–5.1)
Sodium: 139 mmol/L (ref 135–145)
Total Bilirubin: 0.5 mg/dL (ref 0.3–1.2)
Total Protein: 6.6 g/dL (ref 6.5–8.1)

## 2018-04-14 LAB — TROPONIN I
Troponin I: <0.03 ng/mL (ref <0.03)
Troponin I: <0.03 ng/mL (ref <0.03)

## 2018-04-14 LAB — CBC
HCT: 39.3 % (ref 36.0–46.0)
Hemoglobin: 12.7 g/dL (ref 12.0–15.0)
MCH: 30.1 pg (ref 26.0–34.0)
MCHC: 32.3 g/dL (ref 30.0–36.0)
MCV: 93.1 fL (ref 78.0–100.0)
Platelets: 257 K/uL (ref 150–400)
RBC: 4.22 MIL/uL (ref 3.87–5.11)
RDW: 11.9 % (ref 11.5–15.5)
WBC: 8.4 K/uL (ref 4.0–10.5)

## 2018-04-14 MED ORDER — ENOXAPARIN SODIUM 40 MG/0.4ML ~~LOC~~ SOLN
40.0000 mg | SUBCUTANEOUS | Status: DC
  Administered 2018-04-15 – 2018-04-18 (×4): 40 mg via SUBCUTANEOUS
  Filled 2018-04-14 (×4): qty 0.4

## 2018-04-14 MED ORDER — METHYLPREDNISOLONE SODIUM SUCC 125 MG IJ SOLR
60.0000 mg | Freq: Three times a day (TID) | INTRAMUSCULAR | Status: DC
  Administered 2018-04-15 – 2018-04-16 (×5): 60 mg via INTRAVENOUS
  Filled 2018-04-14 (×5): qty 2

## 2018-04-14 MED ORDER — IPRATROPIUM-ALBUTEROL 0.5-2.5 (3) MG/3ML IN SOLN
3.0000 mL | RESPIRATORY_TRACT | Status: DC
  Administered 2018-04-15: 3 mL via RESPIRATORY_TRACT
  Filled 2018-04-14 (×2): qty 3

## 2018-04-14 MED ORDER — PSYLLIUM 95 % PO PACK
1.0000 | PACK | Freq: Two times a day (BID) | ORAL | Status: DC | PRN
  Filled 2018-04-14: qty 1

## 2018-04-14 MED ORDER — TROLAMINE SALICYLATE 10 % EX CREA: 1.0000 "application " | TOPICAL_CREAM | Freq: Every day | CUTANEOUS | Status: DC | PRN

## 2018-04-14 MED ORDER — OXYCODONE-ACETAMINOPHEN 5-325 MG PO TABS
1.0000 | ORAL_TABLET | ORAL | Status: DC | PRN
  Administered 2018-04-14 – 2018-04-17 (×5): 1 via ORAL
  Filled 2018-04-14 (×5): qty 1

## 2018-04-14 MED ORDER — ALBUTEROL SULFATE (2.5 MG/3ML) 0.083% IN NEBU
5.0000 mg | INHALATION_SOLUTION | Freq: Once | RESPIRATORY_TRACT | Status: AC
  Administered 2018-04-14: 5 mg via RESPIRATORY_TRACT
  Filled 2018-04-14: qty 6

## 2018-04-14 MED ORDER — MORPHINE SULFATE (PF) 4 MG/ML IV SOLN
2.0000 mg | Freq: Once | INTRAVENOUS | Status: AC
  Administered 2018-04-14: 2 mg via INTRAVENOUS
  Filled 2018-04-14: qty 1

## 2018-04-14 MED ORDER — VITAMIN D 1000 UNITS PO TABS
1000.0000 [IU] | ORAL_TABLET | Freq: Every day | ORAL | Status: DC
  Administered 2018-04-15 – 2018-04-19 (×5): 1000 [IU] via ORAL
  Filled 2018-04-14 (×5): qty 1

## 2018-04-14 MED ORDER — GUAIFENESIN ER 600 MG PO TB12: 600.0000 mg | ORAL_TABLET | Freq: Two times a day (BID) | ORAL | Status: DC | PRN

## 2018-04-14 MED ORDER — FAMOTIDINE 20 MG PO TABS
40.0000 mg | ORAL_TABLET | Freq: Every day | ORAL | Status: DC
  Administered 2018-04-15 – 2018-04-18 (×5): 40 mg via ORAL
  Filled 2018-04-14 (×5): qty 2

## 2018-04-14 MED ORDER — FLUTICASONE PROPIONATE 50 MCG/ACT NA SUSP
2.0000 | Freq: Every day | NASAL | Status: DC | PRN
Start: 2018-04-14 — End: 2018-04-19

## 2018-04-14 MED ORDER — GABAPENTIN 100 MG PO CAPS
100.0000 mg | ORAL_CAPSULE | Freq: Three times a day (TID) | ORAL | Status: DC | PRN
  Administered 2018-04-14: 200 mg via ORAL
  Filled 2018-04-14: qty 2

## 2018-04-14 MED ORDER — LUBIPROSTONE 24 MCG PO CAPS
24.0000 ug | ORAL_CAPSULE | Freq: Two times a day (BID) | ORAL | Status: DC
  Administered 2018-04-15 – 2018-04-19 (×9): 24 ug via ORAL
  Filled 2018-04-14 (×11): qty 1

## 2018-04-14 MED ORDER — CARVEDILOL 3.125 MG PO TABS
3.1250 mg | ORAL_TABLET | Freq: Two times a day (BID) | ORAL | Status: DC
  Administered 2018-04-15 – 2018-04-19 (×9): 3.125 mg via ORAL
  Filled 2018-04-14 (×10): qty 1

## 2018-04-14 MED ORDER — AMITRIPTYLINE HCL 10 MG PO TABS
10.0000 mg | ORAL_TABLET | Freq: Every evening | ORAL | Status: DC | PRN
  Filled 2018-04-14: qty 2

## 2018-04-14 MED ORDER — ALBUTEROL SULFATE (2.5 MG/3ML) 0.083% IN NEBU: 5.0000 mg | INHALATION_SOLUTION | RESPIRATORY_TRACT | Status: DC | PRN

## 2018-04-14 MED ORDER — ARFORMOTEROL TARTRATE 15 MCG/2ML IN NEBU
15.0000 ug | INHALATION_SOLUTION | Freq: Two times a day (BID) | RESPIRATORY_TRACT | Status: DC
  Administered 2018-04-15 – 2018-04-19 (×9): 15 ug via RESPIRATORY_TRACT
  Filled 2018-04-14 (×10): qty 2

## 2018-04-14 MED ORDER — IPRATROPIUM-ALBUTEROL 0.5-2.5 (3) MG/3ML IN SOLN
3.0000 mL | Freq: Once | RESPIRATORY_TRACT | Status: AC
  Administered 2018-04-14: 3 mL via RESPIRATORY_TRACT
  Filled 2018-04-14: qty 3

## 2018-04-14 MED ORDER — ISOSORBIDE MONONITRATE ER 60 MG PO TB24
120.0000 mg | ORAL_TABLET | Freq: Every day | ORAL | Status: DC
  Administered 2018-04-15 – 2018-04-19 (×5): 120 mg via ORAL
  Filled 2018-04-14 (×4): qty 2
  Filled 2018-04-14: qty 4
  Filled 2018-04-14: qty 2
  Filled 2018-04-14: qty 4

## 2018-04-14 MED ORDER — NITROGLYCERIN 0.4 MG SL SUBL: 0.4000 mg | SUBLINGUAL_TABLET | SUBLINGUAL | Status: DC | PRN

## 2018-04-14 MED ORDER — METHYLPREDNISOLONE SODIUM SUCC 125 MG IJ SOLR
125.0000 mg | Freq: Once | INTRAMUSCULAR | Status: AC
  Administered 2018-04-14: 125 mg via INTRAVENOUS
  Filled 2018-04-14: qty 2

## 2018-04-14 NOTE — ED Notes (Signed)
Pt returned from CT °

## 2018-04-14 NOTE — ED Notes (Signed)
Pt placed on bedpan

## 2018-04-14 NOTE — ED Notes (Signed)
Pt states unable to provide urine sample at this time.

## 2018-04-14 NOTE — ED Notes (Signed)
Pt ambulated approx 5 feet with oxygen saturations lowering to 92% while on her normal 2L [place].

## 2018-04-14 NOTE — ED Triage Notes (Signed)
Patient arrives from home with [facility] with complaint of intermittent dizziness, abdominal pain, and diarrhea x2 days. Patient alert and oriented and in no apparent distress at this time. Wears 2L O2 [place] at baseline.

## 2018-04-14 NOTE — ED Notes (Signed)
Patient transported to X-ray 

## 2018-04-14 NOTE — ED Provider Notes (Signed)
[facility] EMERGENCY DEPARTMENT Provider Note   CSN: [id number] Arrival date & time: 04/14/18  1025     History   Chief Complaint Chief Complaint  Patient presents with  . Abdominal Pain    HPI NOLA BOTKINS is a 83 y.o. female.  The history is provided by the patient. No language interpreter was used.    CARLIYAH COTTERMAN is a 83 y.o. female who presents to the Emergency Department complaining of abdominal pain, sob. She presents for two days of shortness of breath that is worse in the mornings. She also complains of two weeks of progressive abdominal pain. Abdominal pain is generalized and intermittent in nature. No clear alleviating or worsening factors. She does endorse some abdominal bloating. She has diarrhea today, it is unclear how long she is experienced diarrhea. She is taking medicine for constipation and she states that this caused her diarrhea. She denies any fevers but does feel hot at times. No cough, chest pain, leg swelling or pain. She does endorse his stuffy nose. She is very vague and poor historian.  Past Medical History:  Diagnosis Date  . Arthritis    "aching bones sometimes"  . Blurred vision   . CAD (coronary artery disease)    Multiple percutaneous revascularization. Catheterization May 2013 left main normal, patent LAD stent, 90% circumflex stenosis, patent right coronary artery stents. Patient had a DES to the circumflex. The EF was well-preserved.  Marland Kitchen COPD (chronic obstructive pulmonary disease) ([place])   . Fatigue   . GERD (gastroesophageal reflux disease)   . Hard of hearing   . HTN (hypertension)   . Hx of radiation therapy    right nose 4500 cGy in 10 sessions over 5 weeks (2 treatments a week)  . Hyperlipemia   . Squamous cell carcinoma of nose    right    Patient Active Problem List   Diagnosis Date Noted  . COPD (chronic obstructive pulmonary disease) ([place]) 10/06/2017  . Depression 10/05/2017  . Rectal pain 10/05/2017  .  AP (abdominal pain)   . Atypical chest pain 11/28/2014  . Tobacco abuse 11/28/2014  . Skin cancer of nose   . Squamous cell carcinoma of skin of other and unspecified parts of face 10/15/2013  . COPD exacerbation ([place]) 01/30/2013  . Abdominal pain 01/30/2013  . Constipation 01/30/2013  . Hypokalemia 01/30/2013  . Leukocytosis 05/14/2012  . HTN (hypertension) 05/14/2012  . Progressive angina ([place]) 05/07/2012  . Hyperlipemia   . Dysphagia, unspecified(787.20) 02/04/2012  . Esophageal reflux 02/04/2012  . Coronary atherosclerosis 08/28/2010  . ANAL OR RECTAL PAIN 08/28/2010  . NAUSEA ALONE 08/28/2010    Past Surgical History:  Procedure Laterality Date  . ABDOMINAL HYSTERECTOMY    . CORONARY ANGIOPLASTY WITH STENT PLACEMENT  2002-2009   x 5  . CORONARY ANGIOPLASTY WITH STENT PLACEMENT  05/06/12   "1; this makes 5"  . ESOPHAGEAL DILATION  ~ 2012   Dr. Deatra Ina  . LUMBAR SPINE SURGERY  04/2011   Rods, screws and cages  . PERCUTANEOUS CORONARY STENT INTERVENTION (PCI-S) N/A 05/06/2012   Procedure: PERCUTANEOUS CORONARY STENT INTERVENTION (PCI-S);  Surgeon: Burnell Blanks, MD;  Location: [facility] C[facility] LAB;  Service: Cardiovascular;  Laterality: N/A;  . SKIN BIOPSY  10/15/13   right nose-microinvasive squamous cell carcinoma     OB History   None      Home Medications    Prior to Admission medications   Medication Sig Start Date End Date Taking?  Authorizing Provider  acetaminophen (TYLENOL 8 HOUR) 650 MG CR tablet Take 1 tablet (650 mg total) by mouth every 8 (eight) hours as needed. 03/16/18  Yes Varney Biles, MD  albuterol (PROVENTIL HFA;VENTOLIN HFA) 108 (90 BASE) MCG/ACT inhaler Inhale 2 puffs into the lungs every 4 (four) hours as needed for wheezing. 12/01/14  Yes Delfina Redwood, MD  amitriptyline (ELAVIL) 10 MG tablet Take 10-20 mg by mouth at bedtime as needed for sleep (pain).   Yes [provider]  arformoterol (BROVANA) 15 MCG/2ML NEBU Take 2 mLs (15  mcg total) by nebulization 2 (two) times daily. 11/28/17  Yes Mannam, Praveen, MD  aspirin 81 MG tablet Take 1 tablet (81 mg total) by mouth daily. 10/13/17  Yes Cherene Altes, MD  budesonide (PULMICORT) 0.25 MG/2ML nebulizer solution Take 2 mLs (0.25 mg total) by nebulization 2 (two) times daily. Patient taking differently: Take 0.25 mg by nebulization 4 (four) times daily.  11/28/17 11/28/18 Yes Mannam, Praveen, MD  carvedilol (COREG) 3.125 MG tablet Take 1 tablet (3.125 mg total) by mouth 2 (two) times daily with a meal. 10/12/17  Yes Cherene Altes, MD  cholecalciferol (VITAMIN D) 1000 UNITS tablet Take 1,000 Units by mouth daily.    Yes [provider]  dicyclomine (BENTYL) 20 MG tablet Take 1 tablet (20 mg total) by mouth 3 (three) times daily as needed for spasms. Cramping abdominal pain. 08/16/17  Yes Charlesetta Shanks, MD  fluticasone (FLONASE) 50 MCG/ACT nasal spray Place 2 sprays into both nostrils daily as needed for allergies or rhinitis. 10/12/17  Yes Cherene Altes, MD  gabapentin (NEURONTIN) 100 MG capsule Take 100-200 mg by mouth every 8 (eight) hours as needed (pain).  04/02/18  Yes [provider]  guaiFENesin (MUCINEX) 600 MG 12 hr tablet Take 1 tablet (600 mg total) by mouth 2 (two) times daily. Patient taking differently: Take 600 mg by mouth 2 (two) times daily as needed for to loosen phlegm.  02/03/13  Yes Kathie Dike, MD  ibuprofen (ADVIL,MOTRIN) 400 MG tablet Take 1 tablet (400 mg total) by mouth every 6 (six) hours as needed. 03/16/18  Yes Varney Biles, MD  isosorbide mononitrate (IMDUR) 120 MG 24 hr tablet Take 1 tablet (120 mg total) by mouth daily. 03/27/18  Yes Minus Breeding, MD  lubiprostone (AMITIZA) 24 MCG capsule Take 1 capsule (24 mcg total) by mouth 2 (two) times daily with a meal. 12/23/17  Yes Nandigam, Venia Minks, MD  nitroGLYCERIN (NITROSTAT) 0.4 MG SL tablet Place 0.4 mg under the tongue every 5 (five) minutes as needed for chest  pain.   Yes [provider]  psyllium (METAMUCIL SMOOTH TEXTURE) 28 % packet Take 1 packet by mouth 2 (two) times daily as needed (constipation).    Yes [provider]  ranitidine (ZANTAC) 300 MG tablet Take 0.5 tablets (150 mg total) by mouth 2 (two) times daily. Patient taking differently: Take 300 mg by mouth at bedtime.  02/21/18  Yes Ledell Noss, MD  trolamine salicylate (ASPERCREME) 10 % cream Apply 1 application topically daily as needed for muscle pain.   Yes [provider]  amLODipine (NORVASC) 10 MG tablet Take 1 tablet (10 mg total) by mouth daily. Patient not taking: Reported on 04/14/2018 10/13/17   Cherene Altes, MD  docusate sodium (COLACE) 100 MG capsule Take 1 capsule (100 mg total) by mouth every 12 (twelve) hours. Patient not taking: Reported on 04/14/2018 08/16/17   Charlesetta Shanks, MD  polyethylene glycol (  MIRALAX) packet Take 17 g by mouth daily. Patient not taking: Reported on 04/14/2018 03/16/18   Varney Biles, MD  calcium carbonate (OS-CAL) 600 MG TABS Take 600 mg by mouth daily.    02/04/12  [provider]  pravastatin (PRAVACHOL) 40 MG tablet Take 40 mg by mouth daily.    02/04/12  [provider]    Family History Family History  Problem Relation Age of Onset  . Glaucoma Father   . Heart attack Father   . Bone cancer Mother     Social History Social History   Tobacco Use  . Smoking status: Former Smoker    Packs/day: 0.50    Years: 55.00    Pack years: 27.50    Types: Cigarettes    Last attempt to quit: 11/28/2012    Years since quitting: 5.3  . Smokeless tobacco: Never Used  . Tobacco comment: Quit earlier this year.   Substance Use Topics  . Alcohol use: No    Alcohol/week: 0.0 oz  . Drug use: No     Allergies   Iohexol and Lipitor [atorvastatin calcium]   Review of Systems Review of Systems  All other systems reviewed and are negative.    Physical Exam Updated Vital Signs BP (!) 160/61    Pulse (!) 58   Temp 97.8 F (36.6 C)   Resp (!) 25   SpO2 97%   Physical Exam  Constitutional: She is oriented to person, place, and time. She appears well-developed and well-nourished.  HENT:  Head: Normocephalic and atraumatic.  Cardiovascular: Normal rate and regular rhythm.  No murmur heard. Pulmonary/Chest: Effort normal and breath sounds normal. No respiratory distress.  Abdominal: Soft. There is no rebound and no guarding.  Mild generalized abdominal tenderness  Musculoskeletal: She exhibits no edema or tenderness.  2+ DP pulses. There is mild tenderness over the right lateral hip and pain in the hip with raising the right lower extremity.  Neurological: She is alert and oriented to person, place, and time.  Five out of five strength in bilateral lower extremities with sensation to light touch intact in bilateral lower extremities  Skin: Skin is warm and dry.  Psychiatric: She has a normal mood and affect. Her behavior is normal.  Nursing note and vitals reviewed.    ED Treatments / Results  Labs (all labs ordered are listed, but only abnormal results are displayed) Labs Reviewed  COMPREHENSIVE METABOLIC PANEL - Abnormal; Notable for the following components:      Result Value   Glucose, Bld 119 (*)    Creatinine, Ser 1.06 (*)    GFR calc non Af Amer 48 (*)    GFR calc Af Amer 56 (*)    All other components within normal limits  URINE CULTURE  LIPASE, BLOOD  CBC  TROPONIN I  TROPONIN I  URINALYSIS, ROUTINE W REFLEX MICROSCOPIC    EKG EKG Interpretation  Date/Time:  Tuesday Apr 14 2018 10:45:29 EDT Ventricular Rate:  50 PR Interval:    QRS Duration: 121 QT Interval:  452 QTC Calculation: 413 R Axis:   85 Text Interpretation:  Sinus rhythm IVCD, consider atypical RBBB Baseline wander in lead(s) V6 Confirmed by Quintella Reichert [id number]) on 04/14/2018 10:50:09 AM   Radiology Dg Abd Acute W/chest  Result Date: 04/14/2018 CLINICAL DATA:  83 year old female  with abdominal pain and shortness of breath. EXAM: DG ABDOMEN ACUTE W/ 1V CHEST COMPARISON:  Chest and abdominal series 11/07/2017 and earlier. FINDINGS: Seated upright  AP view of the chest. Lower lung volumes. There is cardiomegaly. Other mediastinal contours are within normal limits. visualized tracheal air column is within normal limits. No pneumothorax or pneumoperitoneum. No confluent pulmonary opacity. Stable pulmonary vascularity. Increased small bowel gas in the left abdomen compared to 2018, but overall nonobstructed bowel gas pattern. Chronic postoperative changes to the lumbar spine with stable hardware. No acute osseous abnormality identified. Right side femoral artery calcified atherosclerosis. IMPRESSION: 1.  No acute cardiopulmonary abnormality. 2. Nonobstructed bowel gas pattern, no free air. Electronically Signed   By: Genevie Ann M.D.   On: 04/14/2018 12:49    Procedures Procedures (including critical care time)  Medications Ordered in ED Medications  albuterol (PROVENTIL) (2.5 MG/3ML) 0.083% nebulizer solution 5 mg (5 mg Nebulization Given 04/14/18 1155)  albuterol (PROVENTIL) (2.5 MG/3ML) 0.083% nebulizer solution 5 mg (5 mg Nebulization Given 04/14/18 1639)  methylPREDNISolone sodium succinate (SOLU-MEDROL) 125 mg/2 mL injection 125 mg (125 mg Intravenous Given 04/14/18 1638)  morphine 4 MG/ML injection 2 mg (2 mg Intravenous Given 04/14/18 1639)     Initial Impression / Assessment and Plan / ED Course  I have reviewed the triage vital signs and the nursing notes.  Pertinent labs & imaging results that were available during my care of the patient were reviewed by me and considered in my medical decision making (see chart for details).     Patient here for evaluation of abdominal pain, shortness of breath. Initial evaluation she endorsed significant abdominal pain, diarrhea as well as shortness of breath. On repeat assessment she endorses increased shortness of breath with  epigastric pain as well as right hip pain that radiates down the right leg. The right leg pain has associated paresthesias. Patient states the leg pain and hip pain are all new over the last few days but on record review she is had these complaints in the past. On repeat assessment she is more tachypnea can she does have an expiratory wheezes and appears uncomfortable. Will treat with Solu-Medrol, albuterol, pain meds for possible COPD exacerbation. UA is pending at this time, will add on CT to evaluate for renal stone. Presentation is not consistent with cauda equina, epidural abscess. Patient care transferred pending reassessment following breathing treatment, UA, CT.  Final Clinical Impressions(s) / ED Diagnoses   Final diagnoses:  None    ED Discharge Orders    None       Quintella Reichert, MD 04/14/18 1717

## 2018-04-14 NOTE — ED Provider Notes (Addendum)
Care assumed from Dr. Ralene Bathe while awaiting CT scan and reassessment after breathing treatments.  Patient presented primarily for abdominal pain and flank pain and was getting urinalysis and CT scan.  Patient also was found to have severe shortness of breath and wheezing consistent with COPD exacerbation.  Patient received 2 DuoNeb treatments and steroids.  8:56 PM Patient's urinalysis is reassuring.  No evidence of pyelonephritis or UTI.  Other laboratory testing was overall reassuring  CT scan showed concern for a posterior hernia in the area of the right flank where the patient is having her pain.  There was evidence of some haziness which could be inflammation that fits with the pain.  I looked through old CT scans and appear to see similar appearance, do not feel patient needs emergent surgery consultation however thi fat-containing hernia may be contributing to her discomfort.  More concerning was the patient's continued wheezing and shortness of breath on my reassessment.  Patient is on her home 2 L but feels that if she tried to ambulates she would not feel comfortable going home.  Patient is still wheezing and short of breath despite the steroids and breathing treatments.  Plan of care at signout was to reassess and consider admission if her breathing had not improved.  Patient still is having wheezing and does not feel safe going home.  Patient will also be able to continue her pain management for the hernia pain on her back.    Hospitalist team called for admission.   Clinical Impression: 1. COPD exacerbation ([place])     Disposition: Admit  This note was prepared with assistance of Dragon voice recognition software. Occasional wrong-word or sound-a-like substitutions may have occurred due to the inherent limitations of voice recognition software.      Tegeler, Gwenyth Allegra, MD 04/15/18 1610    Tegeler, Gwenyth Allegra, MD 04/15/18 [phone]

## 2018-04-14 NOTE — ED Notes (Signed)
Pt dinner tray at bedside.  

## 2018-04-14 NOTE — ED Notes (Signed)
Pt requesting to be discharged

## 2018-04-15 ENCOUNTER — Encounter (HOSPITAL_COMMUNITY): Payer: Self-pay | Admitting: General Practice

## 2018-04-15 ENCOUNTER — Other Ambulatory Visit: Payer: Self-pay

## 2018-04-15 DIAGNOSIS — I2510 Atherosclerotic heart disease of native coronary artery without angina pectoris: Secondary | ICD-10-CM

## 2018-04-15 DIAGNOSIS — F329 Major depressive disorder, single episode, unspecified: Secondary | ICD-10-CM

## 2018-04-15 LAB — RESPIRATORY PANEL BY PCR
Adenovirus: NOT DETECTED
Bordetella pertussis: NOT DETECTED
CHLAMYDOPHILA PNEUMONIAE-RVPPCR: NOT DETECTED
CORONAVIRUS OC43-RVPPCR: NOT DETECTED
Coronavirus 229E: NOT DETECTED
Coronavirus HKU1: NOT DETECTED
Coronavirus NL63: NOT DETECTED
INFLUENZA A-RVPPCR: NOT DETECTED
INFLUENZA B-RVPPCR: NOT DETECTED
MYCOPLASMA PNEUMONIAE-RVPPCR: NOT DETECTED
Metapneumovirus: NOT DETECTED
PARAINFLUENZA VIRUS 3-RVPPCR: NOT DETECTED
PARAINFLUENZA VIRUS 4-RVPPCR: NOT DETECTED
Parainfluenza Virus 1: NOT DETECTED
Parainfluenza Virus 2: NOT DETECTED
RESPIRATORY SYNCYTIAL VIRUS-RVPPCR: NOT DETECTED
RHINOVIRUS / ENTEROVIRUS - RVPPCR: NOT DETECTED

## 2018-04-15 LAB — STREP PNEUMONIAE URINARY ANTIGEN: Strep Pneumo Urinary Antigen: NEGATIVE

## 2018-04-15 MED ORDER — ZOLPIDEM TARTRATE 5 MG PO TABS: 5.0000 mg | ORAL_TABLET | Freq: Every evening | ORAL | Status: DC | PRN

## 2018-04-15 MED ORDER — ONDANSETRON HCL 4 MG/2ML IJ SOLN
4.0000 mg | Freq: Three times a day (TID) | INTRAMUSCULAR | Status: DC | PRN
  Administered 2018-04-16: 4 mg via INTRAVENOUS
  Filled 2018-04-15: qty 2

## 2018-04-15 MED ORDER — IPRATROPIUM-ALBUTEROL 0.5-2.5 (3) MG/3ML IN SOLN
3.0000 mL | Freq: Three times a day (TID) | RESPIRATORY_TRACT | Status: DC
  Administered 2018-04-15 (×3): 3 mL via RESPIRATORY_TRACT
  Filled 2018-04-15 (×3): qty 3

## 2018-04-15 MED ORDER — ACETAMINOPHEN 325 MG PO TABS: 650.0000 mg | ORAL_TABLET | Freq: Four times a day (QID) | ORAL | Status: DC | PRN

## 2018-04-15 MED ORDER — MORPHINE SULFATE (PF) 4 MG/ML IV SOLN
0.5000 mg | INTRAVENOUS | Status: DC | PRN
Start: 2018-04-15 — End: 2018-04-17
  Administered 2018-04-16: 0.52 mg via INTRAVENOUS
  Filled 2018-04-15: qty 1

## 2018-04-15 MED ORDER — HYDRALAZINE HCL 20 MG/ML IJ SOLN: 5.0000 mg | INTRAMUSCULAR | Status: DC | PRN

## 2018-04-15 NOTE — ED Notes (Signed)
Pt assisted to sitting position, eating breakfast.

## 2018-04-15 NOTE — ED Notes (Signed)
Pt. Given sprite  and graham crackers.

## 2018-04-15 NOTE — Progress Notes (Signed)
RT instructed pt on the incentive spirometer.  Pt able to demonstrate back good technique and able to reach 1375 mL.

## 2018-04-15 NOTE — Progress Notes (Signed)
PROGRESS NOTE  [name]  EXB:[id number] DOB: 06/10/1935 DOA: 04/14/2018 PCP: Leonard Downing, MD   Brief Narrative: [name] is an 83 y.o. female with a history of CAD s/p stent, HTN, HLD, COPD, GERD, and depression who presented to the ED with multiple complaints including abdominal and back pain and shortness of breath. She reported chronic constipation and recently having multiple loose stools per day after treating this with medications at home. has appeared to have a vague and inconsistent history, but felt she was growing more short of breath, tachypneic and wheezing in the ED. Work up for abdominal pain included a CT which demonstrated a posterior hernia with fat stranding thought to be a possible cause of pain, though she was admitted for COPD exacerbation.   Assessment & Plan: Principal Problem:   COPD exacerbation ([place]) Active Problems:   Coronary atherosclerosis   HTN (hypertension)   Abdominal pain   Depression   GERD (gastroesophageal reflux disease)   Back pain   Right flank pain  COPD exacerbation: RVP negative, unclear precipitant - IV steroids today, taper to po if improving in AM - Continue nebs scheduled and prn  Abdominal pain: Possibly due to bloating with over treatment/correction of constipation (though still w/stool burden on CT). No obstructing stone on CT renal stone study. Diverticulosis without inflammation, and stone/sludge in GB without inflammation also noted. Has PVD but symptoms not consistent with ischemic colitis. Lipase normal.  - Monitor.  - Treat symptomatically  Fat containing right posterior hernia with associated mild hazy fat stranding. Findings could potentially result in acute flank pain. - Will d/w general surgery further, but no indication for urgent surgical evaluation  Back pain/flank pain: Possibly due to posterior hernia, though with multiple tender points and "pain all over" it is possible that she has fibromyalgia.    - Recommend PCP follow up and evaluation for fibromyalgia in outpatient setting.   CAD s/p PCI: No anginal complaints.  - Continue home ASA, coreg, imdur - NTG prn  HTN:  - Continue imdur, coreg  Depression: Stable.  - Continue amitriptyline   DVT prophylaxis: Lovenox Code Status: Full Family Communication: None at bedside Disposition Plan: Continue inpatient management of COPD exacerbation, DC home when improved. PT/OT consults.   Consultants:   None  Procedures:   None  Antimicrobials:  None   Subjective: Breathing is no better than admission as of this morning. Pain improved with medications, but generally encompassing many areas on her body. Denies chest pain.   Objective: Vitals:   04/15/18 1600 04/15/18 1644 04/15/18 1650 04/15/18 1706  BP: 135/71 133/71    Pulse: 75 78    Resp: 18 (!) 22    Temp:  97.6 F (36.4 C)    TempSrc:  Oral    SpO2: 93% (!) 86% 93%   Weight:    89.5 kg (197 lb 4.8 oz)  Height:    5\' 4"  (1.626 m)    Intake/Output Summary (Last 24 hours) at 04/15/2018 1903 Last data filed at 04/15/2018 1027 Gross per 24 hour  Intake -  Output 450 ml  Net -450 ml   Filed Weights   04/15/18 1706  Weight: 89.5 kg (197 lb 4.8 oz)    Gen: Exasperated elderly female in no distress Pulm: Tachypnea with some labored expiration especially when trying to speak >1 sentence. Bilaterally diminished with scant end expiratory wheezing..  CV: Regular rate and rhythm. No murmur, rub, or gallop. No JVD, no pedal edema.  GI: Abdomen soft, non-tender, non-distended, with normoactive bowel sounds. No organomegaly or masses felt. MSK: No palpable abnormality in midline back/paraspinal musculature. Possibly palpable density in adipose of right back/flank though this is not focally tender (diffusely all tender). Forehead, forearms, shins, midchest, and diffusely on the back area all tender to palpation. Skin: No rashes, lesions no ulcers Neuro: Alert and oriented.  No focal neurological deficits. Psych: Judgement and insight appear normal. Mood & affect appropriate.   Data Reviewed: I have personally reviewed following labs and imaging studies  CBC: Recent Labs  Lab 04/14/18 1043  WBC 8.4  HGB 12.7  HCT 39.3  MCV 93.1  PLT 063   Basic Metabolic Panel: Recent Labs  Lab 04/14/18 1043  NA 139  K 3.9  CL 103  CO2 28  GLUCOSE 119*  BUN 12  CREATININE 1.06*  CALCIUM 9.4   GFR: Estimated Creatinine Clearance: 45.1 mL/min (A) (by C-G formula based on SCr of 1.06 mg/dL (H)). Liver Function Tests: Recent Labs  Lab 04/14/18 1043  AST 18  ALT 14  ALKPHOS 54  BILITOT 0.5  PROT 6.6  ALBUMIN 3.8   Recent Labs  Lab 04/14/18 1043  LIPASE 34   No results for input(s): AMMONIA in the last 168 hours. Coagulation Profile: No results for input(s): INR, PROTIME in the last 168 hours. Cardiac Enzymes: Recent Labs  Lab 04/14/18 1050 04/14/18 1420  TROPONINI <0.03 <0.03   BNP (last 3 results) No results for input(s): PROBNP in the last 8760 hours. HbA1C: No results for input(s): HGBA1C in the last 72 hours. CBG: No results for input(s): GLUCAP in the last 168 hours. Lipid Profile: No results for input(s): CHOL, HDL, LDLCALC, TRIG, CHOLHDL, LDLDIRECT in the last 72 hours. Thyroid Function Tests: No results for input(s): TSH, T4TOTAL, FREET4, T3FREE, THYROIDAB in the last 72 hours. Anemia Panel: No results for input(s): VITAMINB12, FOLATE, FERRITIN, TIBC, IRON, RETICCTPCT in the last 72 hours. Urine analysis:    Component Value Date/Time   COLORURINE YELLOW 04/14/2018 1750   APPEARANCEUR CLEAR 04/14/2018 1750   LABSPEC 1.011 04/14/2018 1750   PHURINE 6.0 04/14/2018 1750   GLUCOSEU NEGATIVE 04/14/2018 1750   HGBUR NEGATIVE 04/14/2018 1750   BILIRUBINUR NEGATIVE 04/14/2018 1750   KETONESUR NEGATIVE 04/14/2018 1750   PROTEINUR NEGATIVE 04/14/2018 1750   UROBILINOGEN 0.2 11/28/2014 1557   NITRITE NEGATIVE 04/14/2018 1750    LEUKOCYTESUR NEGATIVE 04/14/2018 1750   Recent Results (from the past 240 hour(s))  Respiratory Panel by PCR     Status: None   Collection Time: 04/14/18 11:25 PM  Result Value Ref Range Status   Adenovirus NOT DETECTED NOT DETECTED Final   Coronavirus 229E NOT DETECTED NOT DETECTED Final   Coronavirus HKU1 NOT DETECTED NOT DETECTED Final   Coronavirus NL63 NOT DETECTED NOT DETECTED Final   Coronavirus OC43 NOT DETECTED NOT DETECTED Final   Metapneumovirus NOT DETECTED NOT DETECTED Final   Rhinovirus / Enterovirus NOT DETECTED NOT DETECTED Final   Influenza A NOT DETECTED NOT DETECTED Final   Influenza B NOT DETECTED NOT DETECTED Final   Parainfluenza Virus 1 NOT DETECTED NOT DETECTED Final   Parainfluenza Virus 2 NOT DETECTED NOT DETECTED Final   Parainfluenza Virus 3 NOT DETECTED NOT DETECTED Final   Parainfluenza Virus 4 NOT DETECTED NOT DETECTED Final   Respiratory Syncytial Virus NOT DETECTED NOT DETECTED Final   Bordetella pertussis NOT DETECTED NOT DETECTED Final   Chlamydophila pneumoniae NOT DETECTED NOT DETECTED Final   Mycoplasma pneumoniae  NOT DETECTED NOT DETECTED Final      Radiology Studies: Dg Abd Acute W/chest  Result Date: 04/14/2018 CLINICAL DATA:  83 year old female with abdominal pain and shortness of breath. EXAM: DG ABDOMEN ACUTE W/ 1V CHEST COMPARISON:  Chest and abdominal series 11/07/2017 and earlier. FINDINGS: Seated upright AP view of the chest. Lower lung volumes. There is cardiomegaly. Other mediastinal contours are within normal limits. visualized tracheal air column is within normal limits. No pneumothorax or pneumoperitoneum. No confluent pulmonary opacity. Stable pulmonary vascularity. Increased small bowel gas in the left abdomen compared to 2018, but overall nonobstructed bowel gas pattern. Chronic postoperative changes to the lumbar spine with stable hardware. No acute osseous abnormality identified. Right side femoral artery calcified atherosclerosis.  IMPRESSION: 1.  No acute cardiopulmonary abnormality. 2. Nonobstructed bowel gas pattern, no free air. Electronically Signed   By: Genevie Ann M.D.   On: 04/14/2018 12:49   Ct Renal Stone Study  Result Date: 04/14/2018 CLINICAL DATA:  Initial evaluation for acute flank pain. EXAM: CT ABDOMEN AND PELVIS WITHOUT CONTRAST TECHNIQUE: Multidetector CT imaging of the abdomen and pelvis was performed following the standard protocol without IV contrast. COMPARISON:  Prior CT from 03/16/2018 FINDINGS: Lower chest: Scattered atelectatic changes seen dependently within the visualized lung bases. Visualized lungs are otherwise clear. Cardiomegaly partially visualized. Hepatobiliary: Liver demonstrates a normal unenhanced appearance. Layering hyperdensity within the gallbladder lumen suspicious for stones and/or sludge. No imaging findings to suggest acute cholecystitis. No biliary dilatation. Pancreas: Pancreas within normal limits. Spleen: Spleen within normal limits. Adrenals/Urinary Tract: 11 mm left adrenal nodule, most consistent with a small adenoma, stable from previous. Adrenal glands otherwise unremarkable. Bilateral renal cysts arising from the interpolar regions of both kidneys measure up to 2.6 cm on the right and 2 cm on the left. Punctate nonobstructive calculi measuring up to 3 mm present within the upper pole the right kidney. 4 mm nonobstructive stone present within the upper pole of the left kidney. No radiopaque calculi seen along the course of either renal collecting system. No hydronephrosis or hydroureter. Partially distended bladder within normal limits. No layering stones within the bladder lumen. Stomach/Bowel: Stomach within normal limits. No evidence for bowel obstruction. Extensive colonic diverticulosis without evidence for acute diverticulitis. No acute inflammatory changes seen about the bowels. Vascular/Lymphatic: Advanced aorto bi-iliac atherosclerotic disease. Ectatic intra-abdominal aorta  measuring up to 2.5 cm without frank aneurysm. No adenopathy. Reproductive: Uterus is absent.  Ovaries within normal limits. Other: No free air or fluid. Small fat containing paraumbilical hernia noted without associated inflammation. Additional posterior fat containing right-sided hernia noted (series 3, image 38). Minimal hazy fat stranding without significant inflammation. Musculoskeletal: No acute osseous abnormality. No worrisome lytic or blastic osseous lesions. Postsurgical changes from prior PLIF at L1 through L5. Extensive degenerative spondylolysis noted throughout the visualized spine. IMPRESSION: 1. Bilateral nonobstructive nephrolithiasis as above. No CT evidence for obstructive uropathy. 2. Fat containing right posterior hernia with associated mild hazy fat stranding. Findings could potentially result in acute flank pain. Correlation with physical exam recommended. 3. Colonic diverticulosis without evidence for acute diverticulitis. 4. Stones and/or sludge within the gallbladder lumen. No imaging findings to suggest acute cholecystitis. 5. Advanced aorto bi-iliac atherosclerotic disease with ectatic intra-abdominal aorta measuring up to 2.5 cm, stable. Recommendations for follow-up as previously described. 1. Electronically Signed   By: Jeannine Boga M.D.   On: 04/14/2018 18:13    Scheduled Meds: . arformoterol  15 mcg Nebulization BID  . carvedilol  3.125 mg Oral BID WC  . cholecalciferol  1,000 Units Oral Daily  . enoxaparin (LOVENOX) injection  40 mg Subcutaneous Q24H  . famotidine  40 mg Oral QHS  . ipratropium-albuterol  3 mL Nebulization TID  . isosorbide mononitrate  120 mg Oral Daily  . lubiprostone  24 mcg Oral BID WC  . methylPREDNISolone sodium succinate  60 mg Intravenous TID   Continuous Infusions:   LOS: 1 day   Time spent: 25 minutes.  Patrecia Pour, MD [facility] [email or web address] Password [facility] 04/15/2018, 7:03 PM

## 2018-04-15 NOTE — ED Notes (Signed)
Pt. Placed in Luke E 52.  Pt. Is stable and alert and oriented X4.  Skin is p/w/d.  Pt. Answer questions appropriately.  Pt. Placed in a BSC, with assist of 2.  Pt. Urinated clear yellow urine approximately 40 cc.

## 2018-04-15 NOTE — H&P (Signed)
History and Physical    [name] CBJ:[id number] DOB: 02/27/1935 DOA: 04/14/2018  Referring MD/NP/PA:   PCP: Leonard Downing, MD   Patient coming from:  The patient is coming from home.  At baseline, pt is independent for most of ADL.   Chief Complaint: abdominal pain, diarrhea, back pain, right flank pain, shortness of breath.  HPI: [name] is a 83 y.o. female with medical history significant of hypertension, hyperlipidemia, COPD, GERD, hearing loss, CAD, stent placement, depression, who presents with abdominal pain, diarrhea, back pain, right flank pain, shortness of breath.  Patient states he has been having abdominal pain in the past 2 days.  She also reports diarrhea.  States that she had a 2 bowel movement with loose stool yesterday, and just one bowel movement with loose stool today.  Denies nausea vomiting.  Abdominal pain is located in the epigastric area, constant, moderate, nonradiating.  She also reports lower back pain and right flank pain, which is constant, sharp, radiating to the right lower leg. causing numbness in R toes.  No urinary incontinence or loss control for bowel movement.  Patient denies symptoms of UTI.  No hematuria.  Does not have unilateral weakness.  No facial droop or slurred speech. She states that she has shortness of breath and a mild dry cough.  No runny nose or sore throat.  ED Course: pt was found to have WBC 8.4, negative troponin, lipase 34, negative urinalysis, creatinine 1.06, temperature normal, bradycardia, tachypnea, oxygen saturation 94% on 2 L nasal cannula oxygen.  KUB negative.  Patient is admitted to telemetry bed as inpatient.   # CT per renal stone protocol showed 1. Bilateral nonobstructive nephrolithiasis as above. No CT evidence for obstructive uropathy. 2. Fat containing right posterior hernia with associated mild hazy fat stranding. Findings could potentially result in acute flank pain. 3. Colonic diverticulosis  without evidence for acute diverticulitis. 4. Stones and/or sludge within the gallbladder lumen. No imaging findings to suggest acute cholecystitis. 5. Advanced aorto bi-iliac atherosclerotic disease with ectatic intra-abdominal aorta measuring up to 2.5 cm, stable.  Review of Systems:   General: no fevers, chills, no body weight gain, has poor appetite, has fatigue HEENT: no blurry vision, hearing changes or sore throat Respiratory: has dyspnea, coughing, wheezing CV: no chest pain, no palpitations GI: no nausea, vomiting, has abdominal pain, diarrhea, no constipation GU: no dysuria, burning on urination, increased urinary frequency, hematuria  Ext: no leg edema Neuro: no unilateral weakness, numbness, or tingling, no vision change or hearing loss Skin: no rash, no skin tear. MSK: has lower back and right flank pain. Heme: No easy bruising.  Travel history: No recent long distant travel.  Allergy:  Allergies  Allergen Reactions  . Iohexol Other (See Comments)    Code:  HIVES, Desc:  PER ROBIN @ GI, PT IS ALLERGIC TO IVP DYE 08/28/10 RM  . Lipitor [Atorvastatin Calcium] Hives    Past Medical History:  Diagnosis Date  . Arthritis    "aching bones sometimes"  . Blurred vision   . CAD (coronary artery disease)    Multiple percutaneous revascularization. Catheterization May 2013 left main normal, patent LAD stent, 90% circumflex stenosis, patent right coronary artery stents. Patient had a DES to the circumflex. The EF was well-preserved.  Marland Kitchen COPD (chronic obstructive pulmonary disease) ([place])   . Fatigue   . GERD (gastroesophageal reflux disease)   . Hard of hearing   . HTN (hypertension)   . Hx of radiation  therapy    right nose 4500 cGy in 10 sessions over 5 weeks (2 treatments a week)  . Hyperlipemia   . Squamous cell carcinoma of nose    right    Past Surgical History:  Procedure Laterality Date  . ABDOMINAL HYSTERECTOMY    . CORONARY ANGIOPLASTY WITH STENT PLACEMENT   2002-2009   x 5  . CORONARY ANGIOPLASTY WITH STENT PLACEMENT  05/06/12   "1; this makes 5"  . ESOPHAGEAL DILATION  ~ 2012   Dr. Deatra Ina  . LUMBAR SPINE SURGERY  04/2011   Rods, screws and cages  . PERCUTANEOUS CORONARY STENT INTERVENTION (PCI-S) N/A 05/06/2012   Procedure: PERCUTANEOUS CORONARY STENT INTERVENTION (PCI-S);  Surgeon: Burnell Blanks, MD;  Location[facility] C[facility] LAB;  Service: Cardiovascular;  Laterality: N/A;  . SKIN BIOPSY  10/15/13   right nose-microinvasive squamous cell carcinoma    Social History:  reports that she quit smoking about 5 years ago. Her smoking use included cigarettes. She has a 27.50 pack-year smoking history. She has never used smokeless tobacco. She reports that she does not drink alcohol or use drugs.  Family History:  Family History  Problem Relation Age of Onset  . Glaucoma Father   . Heart attack Father   . Bone cancer Mother      Prior to Admission medications   Medication Sig Start Date End Date Taking? Authorizing Provider  acetaminophen (TYLENOL 8 HOUR) 650 MG CR tablet Take 1 tablet (650 mg total) by mouth every 8 (eight) hours as needed. 03/16/18  Yes Varney Biles, MD  albuterol (PROVENTIL HFA;VENTOLIN HFA) 108 (90 BASE) MCG/ACT inhaler Inhale 2 puffs into the lungs every 4 (four) hours as needed for wheezing. 12/01/14  Yes Delfina Redwood, MD  amitriptyline (ELAVIL) 10 MG tablet Take 10-20 mg by mouth at bedtime as needed for sleep (pain).   Yes [provider]  arformoterol (BROVANA) 15 MCG/2ML NEBU Take 2 mLs (15 mcg total) by nebulization 2 (two) times daily. 11/28/17  Yes Mannam, Praveen, MD  aspirin 81 MG tablet Take 1 tablet (81 mg total) by mouth daily. 10/13/17  Yes Cherene Altes, MD  budesonide (PULMICORT) 0.25 MG/2ML nebulizer solution Take 2 mLs (0.25 mg total) by nebulization 2 (two) times daily. Patient taking differently: Take 0.25 mg by nebulization 4 (four) times daily.  11/28/17 11/28/18 Yes Mannam,  Praveen, MD  carvedilol (COREG) 3.125 MG tablet Take 1 tablet (3.125 mg total) by mouth 2 (two) times daily with a meal. 10/12/17  Yes Cherene Altes, MD  cholecalciferol (VITAMIN D) 1000 UNITS tablet Take 1,000 Units by mouth daily.    Yes [provider]  dicyclomine (BENTYL) 20 MG tablet Take 1 tablet (20 mg total) by mouth 3 (three) times daily as needed for spasms. Cramping abdominal pain. 08/16/17  Yes Charlesetta Shanks, MD  fluticasone (FLONASE) 50 MCG/ACT nasal spray Place 2 sprays into both nostrils daily as needed for allergies or rhinitis. 10/12/17  Yes Cherene Altes, MD  gabapentin (NEURONTIN) 100 MG capsule Take 100-200 mg by mouth every 8 (eight) hours as needed (pain).  04/02/18  Yes [provider]  guaiFENesin (MUCINEX) 600 MG 12 hr tablet Take 1 tablet (600 mg total) by mouth 2 (two) times daily. Patient taking differently: Take 600 mg by mouth 2 (two) times daily as needed for to loosen phlegm.  02/03/13  Yes Kathie Dike, MD  ibuprofen (ADVIL,MOTRIN) 400 MG tablet Take 1 tablet (400 mg total) by mouth every  6 (six) hours as needed. 03/16/18  Yes Varney Biles, MD  isosorbide mononitrate (IMDUR) 120 MG 24 hr tablet Take 1 tablet (120 mg total) by mouth daily. 03/27/18  Yes Minus Breeding, MD  lubiprostone (AMITIZA) 24 MCG capsule Take 1 capsule (24 mcg total) by mouth 2 (two) times daily with a meal. 12/23/17  Yes Nandigam, Venia Minks, MD  nitroGLYCERIN (NITROSTAT) 0.4 MG SL tablet Place 0.4 mg under the tongue every 5 (five) minutes as needed for chest pain.   Yes [provider]  psyllium (METAMUCIL SMOOTH TEXTURE) 28 % packet Take 1 packet by mouth 2 (two) times daily as needed (constipation).    Yes [provider]  ranitidine (ZANTAC) 300 MG tablet Take 0.5 tablets (150 mg total) by mouth 2 (two) times daily. Patient taking differently: Take 300 mg by mouth at bedtime.  02/21/18  Yes Ledell Noss, MD  trolamine salicylate (ASPERCREME) 10 %  cream Apply 1 application topically daily as needed for muscle pain.   Yes [provider]  amLODipine (NORVASC) 10 MG tablet Take 1 tablet (10 mg total) by mouth daily. Patient not taking: Reported on 05-05-2018 10/13/17   Cherene Altes, MD  docusate sodium (COLACE) 100 MG capsule Take 1 capsule (100 mg total) by mouth every 12 (twelve) hours. Patient not taking: Reported on 2018-05-05 08/16/17   Charlesetta Shanks, MD  polyethylene glycol [facility]) packet Take 17 g by mouth daily. Patient not taking: Reported on 05/05/2018 03/16/18   Varney Biles, MD  calcium carbonate (OS-CAL) 600 MG TABS Take 600 mg by mouth daily.    02/04/12  [provider]  pravastatin (PRAVACHOL) 40 MG tablet Take 40 mg by mouth daily.    02/04/12  [provider]    Physical Exam: Vitals:   05-May-2018 2145 05/05/18 2215 05-May-2018 2230 05-05-18 2300  BP: (!) 164/73 134/60 (!) 148/65 (!) 151/77  Pulse: 67 71 69 68  Resp:   (!) 23 (!) 25  Temp:      SpO2: 95% 97% 96% 95%   General: Not in acute distress HEENT:       Eyes: PERRL, EOMI, no scleral icterus.       ENT: No discharge from the ears and nose, no pharynx injection, no tonsillar enlargement.        Neck: No JVD, no bruit, no mass felt. Heme: No neck lymph node enlargement. Cardiac: S1/S2, RRR, No murmurs, No gallops or rubs. Respiratory: has decreased air movement bilaterally with mild wheezing GI: Soft, nondistended, has tenderness in epigastric area, no rebound pain, no organomegaly, BS present. GU: No hematuria Ext: No pitting leg edema bilaterally. 2+DP/PT pulse bilaterally. Musculoskeletal: has tenderness in lower back and right flank area. Skin: No rashes.  Neuro: Alert, oriented X3, cranial nerves II-XII grossly intact, moves all extremities normally. Psych: Patient is not psychotic, no suicidal or hemocidal ideation.  Labs on Admission: I have personally reviewed following labs and imaging studies  CBC: Recent Labs    Lab 05/05/2018 1043  WBC 8.4  HGB 12.7  HCT 39.3  MCV 93.1  PLT 093   Basic Metabolic Panel: Recent Labs  Lab 05/05/18 1043  NA 139  K 3.9  CL 103  CO2 28  GLUCOSE 119*  BUN 12  CREATININE 1.06*  CALCIUM 9.4   GFR: CrCl cannot be calculated (Unknown ideal weight.). Liver Function Tests: Recent Labs  Lab 05/05/18 1043  AST 18  ALT 14  ALKPHOS 54  BILITOT 0.5  PROT  6.6  ALBUMIN 3.8   Recent Labs  Lab 04/14/18 1043  LIPASE 34   No results for input(s): AMMONIA in the last 168 hours. Coagulation Profile: No results for input(s): INR, PROTIME in the last 168 hours. Cardiac Enzymes: Recent Labs  Lab 04/14/18 1050 04/14/18 1420  TROPONINI <0.03 <0.03   BNP (last 3 results) No results for input(s): PROBNP in the last 8760 hours. HbA1C: No results for input(s): HGBA1C in the last 72 hours. CBG: No results for input(s): GLUCAP in the last 168 hours. Lipid Profile: No results for input(s): CHOL, HDL, LDLCALC, TRIG, CHOLHDL, LDLDIRECT in the last 72 hours. Thyroid Function Tests: No results for input(s): TSH, T4TOTAL, FREET4, T3FREE, THYROIDAB in the last 72 hours. Anemia Panel: No results for input(s): VITAMINB12, FOLATE, FERRITIN, TIBC, IRON, RETICCTPCT in the last 72 hours. Urine analysis:    Component Value Date/Time   COLORURINE YELLOW 04/14/2018 1750   APPEARANCEUR CLEAR 04/14/2018 1750   LABSPEC 1.011 04/14/2018 1750   PHURINE 6.0 04/14/2018 1750   GLUCOSEU NEGATIVE 04/14/2018 1750   HGBUR NEGATIVE 04/14/2018 1750   BILIRUBINUR NEGATIVE 04/14/2018 1750   KETONESUR NEGATIVE 04/14/2018 1750   PROTEINUR NEGATIVE 04/14/2018 1750   UROBILINOGEN 0.2 11/28/2014 1557   NITRITE NEGATIVE 04/14/2018 1750   LEUKOCYTESUR NEGATIVE 04/14/2018 1750   Sepsis Labs: @LABRCNTIP (procalcitonin:4,lacticidven:4) )No results found for this or any previous visit (from the past 240 hour(s)).   Radiological Exams on Admission: Dg Abd Acute W/chest  Result Date:  04/14/2018 CLINICAL DATA:  83 year old female with abdominal pain and shortness of breath. EXAM: DG ABDOMEN ACUTE W/ 1V CHEST COMPARISON:  Chest and abdominal series 11/07/2017 and earlier. FINDINGS: Seated upright AP view of the chest. Lower lung volumes. There is cardiomegaly. Other mediastinal contours are within normal limits. visualized tracheal air column is within normal limits. No pneumothorax or pneumoperitoneum. No confluent pulmonary opacity. Stable pulmonary vascularity. Increased small bowel gas in the left abdomen compared to 2018, but overall nonobstructed bowel gas pattern. Chronic postoperative changes to the lumbar spine with stable hardware. No acute osseous abnormality identified. Right side femoral artery calcified atherosclerosis. IMPRESSION: 1.  No acute cardiopulmonary abnormality. 2. Nonobstructed bowel gas pattern, no free air. Electronically Signed   By: Genevie Ann M.D.   On: 04/14/2018 12:49   Ct Renal Stone Study  Result Date: 04/14/2018 CLINICAL DATA:  Initial evaluation for acute flank pain. EXAM: CT ABDOMEN AND PELVIS WITHOUT CONTRAST TECHNIQUE: Multidetector CT imaging of the abdomen and pelvis was performed following the standard protocol without IV contrast. COMPARISON:  Prior CT from 03/16/2018 FINDINGS: Lower chest: Scattered atelectatic changes seen dependently within the visualized lung bases. Visualized lungs are otherwise clear. Cardiomegaly partially visualized. Hepatobiliary: Liver demonstrates a normal unenhanced appearance. Layering hyperdensity within the gallbladder lumen suspicious for stones and/or sludge. No imaging findings to suggest acute cholecystitis. No biliary dilatation. Pancreas: Pancreas within normal limits. Spleen: Spleen within normal limits. Adrenals/Urinary Tract: 11 mm left adrenal nodule, most consistent with a small adenoma, stable from previous. Adrenal glands otherwise unremarkable. Bilateral renal cysts arising from the interpolar regions of  both kidneys measure up to 2.6 cm on the right and 2 cm on the left. Punctate nonobstructive calculi measuring up to 3 mm present within the upper pole the right kidney. 4 mm nonobstructive stone present within the upper pole of the left kidney. No radiopaque calculi seen along the course of either renal collecting system. No hydronephrosis or hydroureter. Partially distended bladder within normal limits.  No layering stones within the bladder lumen. Stomach/Bowel: Stomach within normal limits. No evidence for bowel obstruction. Extensive colonic diverticulosis without evidence for acute diverticulitis. No acute inflammatory changes seen about the bowels. Vascular/Lymphatic: Advanced aorto bi-iliac atherosclerotic disease. Ectatic intra-abdominal aorta measuring up to 2.5 cm without frank aneurysm. No adenopathy. Reproductive: Uterus is absent.  Ovaries within normal limits. Other: No free air or fluid. Small fat containing paraumbilical hernia noted without associated inflammation. Additional posterior fat containing right-sided hernia noted (series 3, image 38). Minimal hazy fat stranding without significant inflammation. Musculoskeletal: No acute osseous abnormality. No worrisome lytic or blastic osseous lesions. Postsurgical changes from prior PLIF at L1 through L5. Extensive degenerative spondylolysis noted throughout the visualized spine. IMPRESSION: 1. Bilateral nonobstructive nephrolithiasis as above. No CT evidence for obstructive uropathy. 2. Fat containing right posterior hernia with associated mild hazy fat stranding. Findings could potentially result in acute flank pain. Correlation with physical exam recommended. 3. Colonic diverticulosis without evidence for acute diverticulitis. 4. Stones and/or sludge within the gallbladder lumen. No imaging findings to suggest acute cholecystitis. 5. Advanced aorto bi-iliac atherosclerotic disease with ectatic intra-abdominal aorta measuring up to 2.5 cm, stable.  Recommendations for follow-up as previously described. 1. Electronically Signed   By: Jeannine Boga M.D.   On: 04/14/2018 18:13     EKG: Independently reviewed.  Sinus rhythm, QTC 413, low voltage, right bundle blockage, IVCD    Assessment/Plan Principal Problem:   COPD exacerbation (HCC) Active Problems:   Coronary atherosclerosis   HTN (hypertension)   Abdominal pain   Depression   GERD (gastroesophageal reflux disease)   Back pain   Right flank pain   COPD exacerbation [facility]): Patient has shortness of breath, decreased air movement with mild wheezing.  She does not have productive cough.  Consistent with a COPD exacerbation.  -will admit patient to telemetry bed  -Nebulizers: scheduled Duoneb and prn albuterol -Solu-Medrol 60 mg IV tid -Mucinex for cough  -Incentive spirometry -Follow up blood culture x2, sputum culture, respiratory virus panel -Nasal cannula oxygen as needed to maintain O2 saturation 92% or greater  Hx of Coronary atherosclerosis: s/p of stent. No CP -ASA, coreg and imdur -prn NTG  HTN:  -Continue home medications: Coreg -IV hydralazine prn  Abdominal pain: Etiology is not clear.  Lipase normal 34.  CT per renal stone protocol showed Bilateral nonobstructive nephrolithiasis; no CT evidence for obstructive uropathy; colonic diverticulosis without evidence for acute diverticulitis; stones and/or sludge within the gallbladder lumen, but no imaging findings to suggest acute cholecystitis. Ct also showed fat containing right posterior hernia with associated mild hazy fat stranding, which may explain her acute flank pain and may also explain her abdominal pain. -prn morphine and zofran -May need to discuss with general surgeon about posterior hernia  Depression: Stable, no suicidal or homicidal ideations. -Continue home medications: Amitriptyline  GERD: -Pepcid   Back pain and Right flank pain: may be due to "fat containing right posterior  hernia" -prn morphine and percoct for pain   DVT ppx: SQ Lovenox Code Status: Full code Family Communication: None at bed side.  Disposition Plan:  Anticipate discharge back to previous home environment Consults called:  none Admission status:  Inpatient/tele     Date of Service 04/15/2018    Ivor Costa [facility] Pager [phone]  If 7PM-7AM, please contact night-coverage [email or web address] Password [facility] 04/15/2018, 12:29 AM

## 2018-04-15 NOTE — Progress Notes (Signed)
Attempted at get report. RN busy with transporting another patient to our unit

## 2018-04-16 LAB — URINE CULTURE

## 2018-04-16 MED ORDER — METHYLPREDNISOLONE SODIUM SUCC 125 MG IJ SOLR
40.0000 mg | Freq: Two times a day (BID) | INTRAMUSCULAR | Status: DC
  Administered 2018-04-16 – 2018-04-17 (×2): 40 mg via INTRAVENOUS
  Filled 2018-04-16 (×2): qty 2

## 2018-04-16 NOTE — Progress Notes (Signed)
PROGRESS NOTE  [name]  BOF:[id number] DOB: Apr 13, 1935 DOA: 04/14/2018 PCP: Leonard Downing, MD   Brief Narrative: [name] is an 83 y.o. female with a history of CAD s/p stent, HTN, HLD, COPD, GERD, and depression who presented to the ED with multiple complaints including abdominal and back pain and shortness of breath. She reported chronic constipation and recently having multiple loose stools per day after treating this with medications at home. has appeared to have a vague and inconsistent history, but felt she was growing more short of breath, tachypneic and wheezing in the ED. Work up for abdominal pain included a CT which demonstrated a posterior hernia with fat stranding thought to be a possible cause of pain, though she was admitted for COPD exacerbation.   Assessment & Plan: Principal Problem:   COPD exacerbation ([place]) Active Problems:   Coronary atherosclerosis   HTN (hypertension)   Abdominal pain   Depression   GERD (gastroesophageal reflux disease)   Back pain   Right flank pain  Acute hypoxic respiratory failure:  - Continue supplemental oxygen prn, currently up to 4L O2.  COPD exacerbation: RVP negative, unclear precipitant - IV steroids taper to 40mg  q12h, taper to po if improving in AM - Continue nebs scheduled and prn  Abdominal pain: Possibly due to bloating with over treatment/correction of constipation (though still w/stool burden on CT). No obstructing stone on CT renal stone study. Diverticulosis without inflammation, and stone/sludge in GB without inflammation also noted. Has PVD but symptoms not consistent with ischemic colitis. Lipase normal.  - Monitor.  - Treat symptomatically  Right posterior fat herniation: No bowel involvement. Discussed with general surgery, Dr. Ninfa Linden who has never encountered a case like this.  - With only fat involvement, plan to observe for now. Follow up with PCP and consider back surgery referral if treatment  for fibromyalgia/other chronic pain does not improve this back/flank pain.  Back pain/flank pain: Possibly due to posterior hernia, though with multiple tender points and "pain all over" it is possible that she has fibromyalgia.  - Recommend PCP follow up and evaluation for fibromyalgia in outpatient setting.   CAD s/p PCI: No anginal complaints.  - Continue home ASA, coreg, imdur - NTG prn  HTN:  - Continue imdur, coreg  Depression: Stable.  - Continue amitriptyline   DVT prophylaxis: Lovenox Code Status: Full Family Communication: None at bedside Disposition Plan: Continue inpatient management of COPD exacerbation, DC home when improved. PT/OT consults.   Consultants:   None  Procedures:   None  Antimicrobials:  None   Subjective: Breathing is unchanged, couldn't sleep well.   Objective: Vitals:   04/15/18 2143 04/16/18 0527 04/16/18 0843 04/16/18 1406  BP: (!) 165/73 (!) 171/77  (!) 149/64  Pulse: 87 88  78  Resp: 18 18  18   Temp: 97.7 F (36.5 C) 98.1 F (36.7 C)  98.7 F (37.1 C)  TempSrc:  Oral  Oral  SpO2: 94%  94% 98%  Weight:      Height:        Intake/Output Summary (Last 24 hours) at 04/16/2018 1418 Last data filed at 04/16/2018 1022 Gross per 24 hour  Intake -  Output 500 ml  Net -500 ml   Filed Weights   04/15/18 1706  Weight: 89.5 kg (197 lb 4.8 oz)    Gen: Elderly female in no distress sitting on EOB. Pulm: Mildly labored. Bilaterally diminished more at bases with bilateral end expiratory wheezing. CV: Regular  rate and rhythm. No murmur, rub, or gallop. No JVD, no pedal edema. GI: Abdomen soft, non-tender, non-distended, with normoactive bowel sounds. No organomegaly or masses felt. MSK: No palpable abnormality in midline back/paraspinal musculature. Possibly palpable density in adipose of right back/flank though this is not focally tender (diffusely all tender), stable exam today. Forehead, forearms, shins, midchest, and diffusely on  the back area all tender to palpation again today. Skin: No rashes, lesions no ulcers Neuro: Alert and oriented. No focal neurological deficits. Psych: Judgement and insight appear normal. Mood & affect appropriate.   Data Reviewed: I have personally reviewed following labs and imaging studies  CBC: Recent Labs  Lab 04/14/18 1043  WBC 8.4  HGB 12.7  HCT 39.3  MCV 93.1  PLT 259   Basic Metabolic Panel: Recent Labs  Lab 04/14/18 1043  NA 139  K 3.9  CL 103  CO2 28  GLUCOSE 119*  BUN 12  CREATININE 1.06*  CALCIUM 9.4   GFR: Estimated Creatinine Clearance: 45.1 mL/min (A) (by C-G formula based on SCr of 1.06 mg/dL (H)). Liver Function Tests: Recent Labs  Lab 04/14/18 1043  AST 18  ALT 14  ALKPHOS 54  BILITOT 0.5  PROT 6.6  ALBUMIN 3.8   Recent Labs  Lab 04/14/18 1043  LIPASE 34   No results for input(s): AMMONIA in the last 168 hours. Coagulation Profile: No results for input(s): INR, PROTIME in the last 168 hours. Cardiac Enzymes: Recent Labs  Lab 04/14/18 1050 04/14/18 1420  TROPONINI <0.03 <0.03   BNP (last 3 results) No results for input(s): PROBNP in the last 8760 hours. HbA1C: No results for input(s): HGBA1C in the last 72 hours. CBG: No results for input(s): GLUCAP in the last 168 hours. Lipid Profile: No results for input(s): CHOL, HDL, LDLCALC, TRIG, CHOLHDL, LDLDIRECT in the last 72 hours. Thyroid Function Tests: No results for input(s): TSH, T4TOTAL, FREET4, T3FREE, THYROIDAB in the last 72 hours. Anemia Panel: No results for input(s): VITAMINB12, FOLATE, FERRITIN, TIBC, IRON, RETICCTPCT in the last 72 hours. Urine analysis:    Component Value Date/Time   COLORURINE YELLOW 04/14/2018 1750   APPEARANCEUR CLEAR 04/14/2018 1750   LABSPEC 1.011 04/14/2018 1750   PHURINE 6.0 04/14/2018 1750   GLUCOSEU NEGATIVE 04/14/2018 1750   HGBUR NEGATIVE 04/14/2018 1750   BILIRUBINUR NEGATIVE 04/14/2018 1750   KETONESUR NEGATIVE 04/14/2018 1750    PROTEINUR NEGATIVE 04/14/2018 1750   UROBILINOGEN 0.2 11/28/2014 1557   NITRITE NEGATIVE 04/14/2018 1750   LEUKOCYTESUR NEGATIVE 04/14/2018 1750   Recent Results (from the past 240 hour(s))  Respiratory Panel by PCR     Status: None   Collection Time: 04/14/18 11:25 PM  Result Value Ref Range Status   Adenovirus NOT DETECTED NOT DETECTED Final   Coronavirus 229E NOT DETECTED NOT DETECTED Final   Coronavirus HKU1 NOT DETECTED NOT DETECTED Final   Coronavirus NL63 NOT DETECTED NOT DETECTED Final   Coronavirus OC43 NOT DETECTED NOT DETECTED Final   Metapneumovirus NOT DETECTED NOT DETECTED Final   Rhinovirus / Enterovirus NOT DETECTED NOT DETECTED Final   Influenza A NOT DETECTED NOT DETECTED Final   Influenza B NOT DETECTED NOT DETECTED Final   Parainfluenza Virus 1 NOT DETECTED NOT DETECTED Final   Parainfluenza Virus 2 NOT DETECTED NOT DETECTED Final   Parainfluenza Virus 3 NOT DETECTED NOT DETECTED Final   Parainfluenza Virus 4 NOT DETECTED NOT DETECTED Final   Respiratory Syncytial Virus NOT DETECTED NOT DETECTED Final   Bordetella pertussis  NOT DETECTED NOT DETECTED Final   Chlamydophila pneumoniae NOT DETECTED NOT DETECTED Final   Mycoplasma pneumoniae NOT DETECTED NOT DETECTED Final      Radiology Studies: Ct Renal Stone Study  Result Date: 04/14/2018 CLINICAL DATA:  Initial evaluation for acute flank pain. EXAM: CT ABDOMEN AND PELVIS WITHOUT CONTRAST TECHNIQUE: Multidetector CT imaging of the abdomen and pelvis was performed following the standard protocol without IV contrast. COMPARISON:  Prior CT from 03/16/2018 FINDINGS: Lower chest: Scattered atelectatic changes seen dependently within the visualized lung bases. Visualized lungs are otherwise clear. Cardiomegaly partially visualized. Hepatobiliary: Liver demonstrates a normal unenhanced appearance. Layering hyperdensity within the gallbladder lumen suspicious for stones and/or sludge. No imaging findings to suggest acute  cholecystitis. No biliary dilatation. Pancreas: Pancreas within normal limits. Spleen: Spleen within normal limits. Adrenals/Urinary Tract: 11 mm left adrenal nodule, most consistent with a small adenoma, stable from previous. Adrenal glands otherwise unremarkable. Bilateral renal cysts arising from the interpolar regions of both kidneys measure up to 2.6 cm on the right and 2 cm on the left. Punctate nonobstructive calculi measuring up to 3 mm present within the upper pole the right kidney. 4 mm nonobstructive stone present within the upper pole of the left kidney. No radiopaque calculi seen along the course of either renal collecting system. No hydronephrosis or hydroureter. Partially distended bladder within normal limits. No layering stones within the bladder lumen. Stomach/Bowel: Stomach within normal limits. No evidence for bowel obstruction. Extensive colonic diverticulosis without evidence for acute diverticulitis. No acute inflammatory changes seen about the bowels. Vascular/Lymphatic: Advanced aorto bi-iliac atherosclerotic disease. Ectatic intra-abdominal aorta measuring up to 2.5 cm without frank aneurysm. No adenopathy. Reproductive: Uterus is absent.  Ovaries within normal limits. Other: No free air or fluid. Small fat containing paraumbilical hernia noted without associated inflammation. Additional posterior fat containing right-sided hernia noted (series 3, image 38). Minimal hazy fat stranding without significant inflammation. Musculoskeletal: No acute osseous abnormality. No worrisome lytic or blastic osseous lesions. Postsurgical changes from prior PLIF at L1 through L5. Extensive degenerative spondylolysis noted throughout the visualized spine. IMPRESSION: 1. Bilateral nonobstructive nephrolithiasis as above. No CT evidence for obstructive uropathy. 2. Fat containing right posterior hernia with associated mild hazy fat stranding. Findings could potentially result in acute flank pain. Correlation  with physical exam recommended. 3. Colonic diverticulosis without evidence for acute diverticulitis. 4. Stones and/or sludge within the gallbladder lumen. No imaging findings to suggest acute cholecystitis. 5. Advanced aorto bi-iliac atherosclerotic disease with ectatic intra-abdominal aorta measuring up to 2.5 cm, stable. Recommendations for follow-up as previously described. 1. Electronically Signed   By: Jeannine Boga M.D.   On: 04/14/2018 18:13    Scheduled Meds: . arformoterol  15 mcg Nebulization BID  . carvedilol  3.125 mg Oral BID WC  . cholecalciferol  1,000 Units Oral Daily  . enoxaparin (LOVENOX) injection  40 mg Subcutaneous Q24H  . famotidine  40 mg Oral QHS  . isosorbide mononitrate  120 mg Oral Daily  . lubiprostone  24 mcg Oral BID WC  . methylPREDNISolone sodium succinate  60 mg Intravenous TID   Continuous Infusions:   LOS: 2 days   Time spent: 25 minutes.  Patrecia Pour, MD [facility] [email or web address] Password [email or web address] 04/16/2018, 2:18 PM

## 2018-04-16 NOTE — Evaluation (Signed)
Physical Therapy Evaluation Patient Details Name: [name] MRN: [id number] DOB: 1934/12/04 Today's Date: 04/16/2018   History of Present Illness  83 y.o. female with a history of CAD s/p stent, HTN, HLD, COPD, GERD, and depression who presented to the ED with multiple complaints including abdominal and back pain and shortness of breath.. Admitted for COPD exacerbation  Clinical Impression  Pt admitted with above diagnosis. Pt currently with functional limitations due to the deficits listed below (see PT Problem List). PT independent at her baseline, will benefit from PT in acute setting to address deficits noted below; Pt will benefit from skilled PT to increase their independence and safety with mobility to allow discharge to the venue listed below.       Follow Up Recommendations Home health PT;Supervision for mobility/OOB    Equipment Recommendations  None recommended by PT    Recommendations for Other Services       Precautions / Restrictions Precautions Precautions: Fall;Other (comment) Precaution Comments: monitor sats Restrictions Weight Bearing Restrictions: No      Mobility  Bed Mobility               General bed mobility comments: in recliner  Transfers Overall transfer level: Needs assistance Equipment used: Rolling walker (2 wheeled);None Transfers: Sit to/from Stand Sit to Stand: Min guard         General transfer comment: for safety  Ambulation/Gait Ambulation/Gait assistance: Min guard;Min assist Ambulation Distance (Feet): 15 Feet(in room) Assistive device: Rolling walker (2 wheeled);None Gait Pattern/deviations: Step-through pattern     General Gait Details: unsteady without support of RW, fatigues quickly, SpO2=84% on 4L, unsure of accuracy of monitor at times, dyspneic, pt practices pursed lip breathing to recover; standing rest after 8"  Stairs            Wheelchair Mobility    Modified Rankin (Stroke Patients Only)       Balance Overall balance assessment: Needs assistance Sitting-balance support: Single extremity supported Sitting balance-Leahy Scale: Good       Standing balance-Leahy Scale: Fair                               Pertinent Vitals/Pain Pain Assessment: No/denies pain    Home Living Family/patient expects to be discharged to:: Private residence Living Arrangements: Other relatives Available Help at Discharge: Available PRN/intermittently Type of Home: Mobile home Home Access: Stairs to enter Entrance Stairs-Rails: Right;Left;Can reach both Entrance Stairs-Number of Steps: 5 Home Layout: One level Home Equipment: Walker - 2 wheels;Walker - 4 wheels;Bedside commode;Shower seat;Adaptive equipment;Hand held shower head      Prior Function Level of Independence: Independent with assistive device(s)         Comments: on 2L O2 at home     Hand Dominance        Extremity/Trunk Assessment   Upper Extremity Assessment Upper Extremity Assessment: Generalized weakness    Lower Extremity Assessment Lower Extremity Assessment: Generalized weakness       Communication   Communication: HOH  Cognition Arousal/Alertness: Awake/alert Behavior During Therapy: WFL for tasks assessed/performed Overall Cognitive Status: Within Functional Limits for tasks assessed                                        General Comments      Exercises     Assessment/Plan  PT Assessment Patient needs continued PT services  PT Problem List Decreased activity tolerance;Decreased mobility;Cardiopulmonary status limiting activity       PT Treatment Interventions DME instruction;Gait training;Functional mobility training;Therapeutic activities;Therapeutic exercise;Patient/family education;Stair training;Balance training    PT Goals (Current goals can be found in the Care Plan section)  Acute Rehab PT Goals Patient Stated Goal: to get better PT Goal Formulation:  With patient Time For Goal Achievement: 04/30/18 Potential to Achieve Goals: Good    Frequency Min 3X/week   Barriers to discharge        Co-evaluation               AM-PAC PT "6 Clicks" Daily Activity  Outcome Measure Difficulty turning over in bed (including adjusting bedclothes, sheets and blankets)?: A Little Difficulty moving from lying on back to sitting on the side of the bed? : A Little Difficulty sitting down on and standing up from a chair with arms (e.g., wheelchair, bedside commode, etc,.)?: A Little Help needed moving to and from a bed to chair (including a wheelchair)?: A Little Help needed walking in hospital room?: A Little Help needed climbing 3-5 steps with a railing? : A Lot 6 Click Score: 17    End of Session   Activity Tolerance: Patient tolerated treatment well;Patient limited by fatigue Patient left: with call bell/phone within reach   PT Visit Diagnosis: Difficulty in walking, not elsewhere classified (R26.2)    Time: 3343-5686 PT Time Calculation (min) (ACUTE ONLY): 24 min   Charges:   PT Evaluation $PT Eval Low Complexity: 1 Low PT Treatments $Gait Training: 8-22 mins   PT G CodesKenyon Ana, PT Pager: [phone] 04/16/2018   Kenyon Ana 04/16/2018, 4:22 PM

## 2018-04-16 NOTE — Care Management Note (Addendum)
Case Management Note  Patient Details  Name: [name] MRN: [id number] Date of Birth: 31-Aug-1935  Subjective/Objective:           Admitted with COPD exacerbation and c/o abd pain and SOB,hx of CAD s/p stent, HTN, HLD, COPD, GERD, and depression. Resides with sister, [name]. DME: cane, rollator, nebulizer, home oxygen/ [employer].  [name] (Daughter) [name] (Daughter)    [phone]     PCP: Dr.Wilson Arelia Sneddon  Action/Plan: Transition to home with home health ser when medically stable....NCM following for d/c needs. Family to provide portable oxygen tank for transportation to home @ d/c.  Expected Discharge Date:                  Expected Discharge Plan:  [name]  In-House Referral:     Discharge planning Services  CM Consult  Post Acute Care Choice:    Choice offered to:  Patient  DME Arranged:   rolling walker DME Agency:   [place] Arranged:   PT,OT [name] Agency:    [facility], pending MD's orders. NCM has requested orders from MD.  Status of Service:  In process, will continue to follow  If discussed at Long Length of Stay Meetings, dates discussed:    Additional Comments:  Sharin Mons, RN 04/16/2018, 3:15 PM

## 2018-04-16 NOTE — Progress Notes (Signed)
Occupational Therapy Evaluation Patient Details Name: [name] MRN: [id number] DOB: 1935/08/15 Today's Date: 04/16/2018    History of Present Illness 83 y.o. female with a history of CAD s/p stent, HTN, HLD, COPD, GERD, and depression who presented to the ED with multiple complaints including abdominal and back pain and shortness of breath.. Admitted for COPD exacerbation   Clinical Impression   PTA, pt lived at home with her sister and was modified independent with ADL and mobility @ RW level. Sister assists with IADL as needed. Pt currently limited by DOE and desats to low 80s on 6L during ADL tasks (unsure of accuracy of pleth as pt has significant tremor). Quickly rebounds to 96 with rest. Pt left sitting in chair on 4L with O2 sats 94.  Discussed option of short term rehab however pt declines and states "I'm not doing that". Will follow acutely to facilitate safe DC home and recommend HHOT.     Follow Up Recommendations  Home health OT;Supervision - Intermittent    Equipment Recommendations  None recommended by OT    Recommendations for Other Services       Precautions / Restrictions Precautions Precautions: Fall;Other (comment)(watch O2 Sats)      Mobility Bed Mobility               General bed mobility comments: sitting EOB  Transfers Overall transfer level: Needs assistance Equipment used: Rolling walker (2 wheeled) Transfers: Sit to/from Stand Sit to Stand: Min guard              Balance Overall balance assessment: Needs assistance   Sitting balance-Leahy Scale: Good       Standing balance-Leahy Scale: Fair                             ADL either performed or assessed with clinical judgement   ADL Overall ADL's : Needs assistance/impaired     Grooming: Set up;Supervision/safety;Sitting   Upper Body Bathing: Supervision/ safety;Set up;Sitting   Lower Body Bathing: Supervison/ safety;Set up;Sit to/from stand   Upper Body  Dressing : Supervision/safety;Set up;Sitting   Lower Body Dressing: Supervision/safety;Set up;Sit to/from stand   Toilet Transfer: Chief of Staff Details (indicate cue type and reason): desats to low 80s ambulating to bathroom Toileting- Clothing Manipulation and Hygiene: Supervision/safety       Functional mobility during ADLs: Min guard;Rolling walker General ADL Comments: Pt with increased WOB with ADL and O2 desat     Vision Baseline Vision/History: Wears glasses       Perception     Praxis      Pertinent Vitals/Pain Pain Assessment: Faces Faces Pain Scale: Hurts little more Pain Location: general discomfort/"all over" Pain Descriptors / Indicators: Discomfort Pain Intervention(s): Limited activity within patient's tolerance     Hand Dominance Right   Extremity/Trunk Assessment Upper Extremity Assessment Upper Extremity Assessment: Generalized weakness   Lower Extremity Assessment Lower Extremity Assessment: Defer to PT evaluation   Cervical / Trunk Assessment Cervical / Trunk Assessment: Other exceptions(forward head)   Communication Communication Communication: HOH   Cognition Arousal/Alertness: Awake/alert Behavior During Therapy: WFL for tasks assessed/performed Overall Cognitive Status: Within Functional Limits for tasks assessed                                 General Comments: Pt states " I think I'm going to die". Offered pastoral counseling  General Comments       Exercises Exercises: Other exercises Other Exercises Other Exercises: incentive spirometer x 10. Able to pull 1500 ml   Shoulder Instructions      Home Living Family/patient expects to be discharged to:: Private residence Living Arrangements: Other relatives Available Help at Discharge: Available PRN/intermittently Type of Home: Mobile home Home Access: Stairs to enter Entrance Stairs-Number of Steps: 5 Entrance Stairs-Rails:  Right;Left;Can reach both Home Layout: One level     Bathroom Shower/Tub: Teacher, early years/pre: Standard Bathroom Accessibility: Yes How Accessible: Accessible via walker Home Equipment: [place] - 2 wheels;Walker - 4 wheels;Bedside commode;Shower seat;Adaptive equipment;Hand held shower head Adaptive Equipment: Reacher;Sock aid        Prior Functioning/Environment Level of Independence: Independent with assistive device(s)        Comments: sister assisted with IADL as needed; Sister assisted with bathing her back at times; on 2L at home        OT Problem List: Decreased strength;Decreased activity tolerance;Impaired balance (sitting and/or standing);Decreased safety awareness      OT Treatment/Interventions: Self-care/ADL training;Energy conservation;DME and/or AE instruction;Therapeutic activities;Patient/family education    OT Goals(Current goals can be found in the care plan section) Acute Rehab OT Goals Patient Stated Goal: to get better OT Goal Formulation: With patient Time For Goal Achievement: 04/30/18 Potential to Achieve Goals: Good  OT Frequency: Min 2X/week   Barriers to D/C:            Co-evaluation              AM-PAC PT "6 Clicks" Daily Activity     Outcome Measure Help from another person eating meals?: None Help from another person taking care of personal grooming?: A Little Help from another person toileting, which includes using toliet, bedpan, or urinal?: A Little Help from another person bathing (including washing, rinsing, drying)?: A Little Help from another person to put on and taking off regular upper body clothing?: A Little Help from another person to put on and taking off regular lower body clothing?: A Little 6 Click Score: 19   End of Session Equipment Utilized During Treatment: Gait belt;Rolling walker;Oxygen(6L) Nurse Communication: Mobility status;Other (comment)(O2 desat)  Activity Tolerance: Patient limited by  fatigue Patient left: in chair;with call bell/phone within reach  OT Visit Diagnosis: Unsteadiness on feet (R26.81);Muscle weakness (generalized) (M62.81);Other (comment)(poor endurance)                Time: 1033-1100 OT Time Calculation (min): 27 min Charges:  OT General Charges $OT Visit: 1 Visit OT Evaluation $OT Eval Moderate Complexity: 1 Mod OT Treatments $Self Care/Home Management : 8-22 mins G-Codes:     Maurie Boettcher, OT/L  OT Clinical Specialist [phone]   [facility] 04/16/2018, 11:40 AM

## 2018-04-17 DIAGNOSIS — J441 Chronic obstructive pulmonary disease with (acute) exacerbation: Principal | ICD-10-CM

## 2018-04-17 MED ORDER — TRAMADOL HCL 50 MG PO TABS
50.0000 mg | ORAL_TABLET | Freq: Four times a day (QID) | ORAL | Status: DC | PRN
  Administered 2018-04-18: 50 mg via ORAL
  Filled 2018-04-17 (×2): qty 1

## 2018-04-17 MED ORDER — PREDNISONE 50 MG PO TABS
50.0000 mg | ORAL_TABLET | Freq: Every day | ORAL | Status: DC
  Administered 2018-04-18 – 2018-04-19 (×2): 50 mg via ORAL
  Filled 2018-04-17 (×2): qty 1

## 2018-04-17 MED ORDER — OXYCODONE-ACETAMINOPHEN 5-325 MG PO TABS
1.0000 | ORAL_TABLET | ORAL | Status: DC | PRN
  Administered 2018-04-17 – 2018-04-19 (×4): 1 via ORAL
  Filled 2018-04-17 (×4): qty 1

## 2018-04-17 NOTE — Progress Notes (Signed)
PROGRESS NOTE    [name]  GDJ:[id number] DOB: 1935-11-13 DOA: 04/14/2018 PCP: Leonard Downing, MD     Brief Narrative:  [name] is an 83 y.o. female with a history of CAD s/p stent, HTN, HLD, COPD, GERD, and depression who presented to the ED with multiple complaints including abdominal and back pain and shortness of breath. She reported chronic constipation and recently having multiple loose stools per day after treating this with medications at home. She has appeared to have a vague and inconsistent history, but felt she was growing more short of breath, tachypneic and wheezing in the ED. Work up for abdominal pain included a CT which demonstrated a posterior hernia with fat stranding thought to be a possible cause of pain; general surgery did not recommend any inpatient surgical intervention. She was admitted for COPD exacerbation.    New events last 24 hours / Subjective: No acute events overnight.  Continues on nasal cannula O2 at 4 L which is up from her 2 L at baseline.  Continues to be dyspneic with exertion to the bathroom.  Denies any chest pain.  Assessment & Plan:   Principal Problem:   COPD exacerbation ([place]) Active Problems:   Coronary atherosclerosis   HTN (hypertension)   Abdominal pain   Depression   GERD (gastroesophageal reflux disease)   Back pain   Right flank pain   Acute on chronic hypoxic respiratory failure:  - Continue supplemental oxygen prn, currently up to 4L O2 (from baseline of 2L [place] O2)   COPD exacerbation - Taper steroids to PO prednisone tomorrow  - Continue nebs scheduled and prn  Abdominal pain - Possibly due to bloating with over treatment/correction of constipation (though still w/stool burden on CT). No obstructing stone on CT renal stone study. Diverticulosis without inflammation, and stone/sludge in GB without inflammation also noted. Has PVD but symptoms not consistent with ischemic colitis. Lipase normal.   Right  posterior fat herniation - No bowel involvement. Dr. Bonner Puna discussed with general surgery, Dr. Ninfa Linden who has never encountered a case like this. With only fat involvement, plan to observe for now. Follow up with PCP and consider back surgery referral if treatment for fibromyalgia/other chronic pain does not improve this back/flank pain.  Back pain/flank pain - Possibly due to posterior hernia, though with multiple tender points and "pain all over" it is possible that she has fibromyalgia.  - Recommend PCP follow up and evaluation for fibromyalgia in outpatient setting.   CAD s/p PCI: No anginal complaints.  - Continue home ASA, coreg, imdur - NTG prn  HTN:  - Continue imdur, coreg  Depression: Stable.  - Continue amitriptyline    DVT prophylaxis: Lovenox Code Status: Full Family Communication: No family at bedside Disposition Plan: Pending improvement in respiratory status. Home health ordered.    Consultants:   None  Procedures:   None   Antimicrobials:  Anti-infectives (From admission, onward)   None       Objective: Vitals:   04/16/18 2143 04/17/18 0509 04/17/18 0908 04/17/18 1317  BP: (!) 143/63 (!) 146/63  (!) 153/69  Pulse: 68 79  70  Resp: 18 20  20   Temp: 97.8 F (36.6 C) 98.1 F (36.7 C)  97.9 F (36.6 C)  TempSrc:    Oral  SpO2: 94% 97% 93% 95%  Weight:      Height:        Intake/Output Summary (Last 24 hours) at 04/17/2018 1421 Last data filed at  04/17/2018 0955 Gross per 24 hour  Intake 740 ml  Output 600 ml  Net 140 ml   Filed Weights   04/15/18 1706  Weight: 89.5 kg (197 lb 4.8 oz)    Examination:  General exam: Appears calm and comfortable  Respiratory system: Clear to auscultation with expiratory wheezes bilaterally. Respiratory effort normal. On 4L [place] O2  Cardiovascular system: S1 & S2 heard, RRR. No JVD, murmurs, rubs, gallops or clicks. No pedal edema. Gastrointestinal system: Abdomen is nondistended, soft and nontender.  No organomegaly or masses felt. Normal bowel sounds heard. Central nervous system: Alert and oriented. No focal neurological deficits. Extremities: Symmetric 5 x 5 power. Skin: No rashes, lesions or ulcers Psychiatry: Judgement and insight appear normal. Mood & affect appropriate.   Data Reviewed: I have personally reviewed following labs and imaging studies  CBC: Recent Labs  Lab 04/14/18 1043  WBC 8.4  HGB 12.7  HCT 39.3  MCV 93.1  PLT 981   Basic Metabolic Panel: Recent Labs  Lab 04/14/18 1043  NA 139  K 3.9  CL 103  CO2 28  GLUCOSE 119*  BUN 12  CREATININE 1.06*  CALCIUM 9.4   GFR: Estimated Creatinine Clearance: 45.1 mL/min (A) (by C-G formula based on SCr of 1.06 mg/dL (H)). Liver Function Tests: Recent Labs  Lab 04/14/18 1043  AST 18  ALT 14  ALKPHOS 54  BILITOT 0.5  PROT 6.6  ALBUMIN 3.8   Recent Labs  Lab 04/14/18 1043  LIPASE 34   No results for input(s): AMMONIA in the last 168 hours. Coagulation Profile: No results for input(s): INR, PROTIME in the last 168 hours. Cardiac Enzymes: Recent Labs  Lab 04/14/18 1050 04/14/18 1420  TROPONINI <0.03 <0.03   BNP (last 3 results) No results for input(s): PROBNP in the last 8760 hours. HbA1C: No results for input(s): HGBA1C in the last 72 hours. CBG: No results for input(s): GLUCAP in the last 168 hours. Lipid Profile: No results for input(s): CHOL, HDL, LDLCALC, TRIG, CHOLHDL, LDLDIRECT in the last 72 hours. Thyroid Function Tests: No results for input(s): TSH, T4TOTAL, FREET4, T3FREE, THYROIDAB in the last 72 hours. Anemia Panel: No results for input(s): VITAMINB12, FOLATE, FERRITIN, TIBC, IRON, RETICCTPCT in the last 72 hours. Sepsis Labs: No results for input(s): PROCALCITON, LATICACIDVEN in the last 168 hours.  Recent Results (from the past 240 hour(s))  Urine culture     Status: Abnormal   Collection Time: 04/14/18  4:31 PM  Result Value Ref Range Status   Specimen Description  URINE, RANDOM  Final   Special Requests   Final    NONE Performed at [facility], [address], [place], [place] [address]    Culture MULTIPLE SPECIES PRESENT, SUGGEST RECOLLECTION (A)  Final   Report Status 04/16/2018 FINAL  Final  Respiratory Panel by PCR     Status: None   Collection Time: 04/14/18 11:25 PM  Result Value Ref Range Status   Adenovirus NOT DETECTED NOT DETECTED Final   Coronavirus 229E NOT DETECTED NOT DETECTED Final   Coronavirus HKU1 NOT DETECTED NOT DETECTED Final   Coronavirus NL63 NOT DETECTED NOT DETECTED Final   Coronavirus OC43 NOT DETECTED NOT DETECTED Final   Metapneumovirus NOT DETECTED NOT DETECTED Final   Rhinovirus / Enterovirus NOT DETECTED NOT DETECTED Final   Influenza A NOT DETECTED NOT DETECTED Final   Influenza B NOT DETECTED NOT DETECTED Final   Parainfluenza Virus 1 NOT DETECTED NOT DETECTED Final   Parainfluenza  Virus 2 NOT DETECTED NOT DETECTED Final   Parainfluenza Virus 3 NOT DETECTED NOT DETECTED Final   Parainfluenza Virus 4 NOT DETECTED NOT DETECTED Final   Respiratory Syncytial Virus NOT DETECTED NOT DETECTED Final   Bordetella pertussis NOT DETECTED NOT DETECTED Final   Chlamydophila pneumoniae NOT DETECTED NOT DETECTED Final   Mycoplasma pneumoniae NOT DETECTED NOT DETECTED Final  Culture, blood (routine x 2) Call MD if unable to obtain prior to antibiotics being given     Status: None (Preliminary result)   Collection Time: 04/15/18  1:05 AM  Result Value Ref Range Status   Specimen Description BLOOD RIGHT ANTECUBITAL  Final   Special Requests   Final    BOTTLES DRAWN AEROBIC AND ANAEROBIC Blood Culture adequate volume   Culture   Final    NO GROWTH 2 DAYS Performed at [facility], [address], [place], [place] [address]    Report Status PENDING  Incomplete  Culture, blood (routine x 2) Call MD if unable to obtain prior to antibiotics being given     Status: None (Preliminary result)   Collection Time:  04/15/18  1:10 AM  Result Value Ref Range Status   Specimen Description BLOOD LEFT ANTECUBITAL  Final   Special Requests   Final    BOTTLES DRAWN AEROBIC AND ANAEROBIC Blood Culture adequate volume   Culture   Final    NO GROWTH 2 DAYS Performed at [facility], [place] [address], [place], [place] [address]    Report Status PENDING  Incomplete       Radiology Studies: No results found.    Scheduled Meds: . arformoterol  15 mcg Nebulization BID  . carvedilol  3.125 mg Oral BID WC  . cholecalciferol  1,000 Units Oral Daily  . enoxaparin (LOVENOX) injection  40 mg Subcutaneous Q24H  . famotidine  40 mg Oral QHS  . isosorbide mononitrate  120 mg Oral Daily  . lubiprostone  24 mcg Oral BID WC  . [START ON 04/18/2018] predniSONE  50 mg Oral Q breakfast   Continuous Infusions:   LOS: 3 days    Time spent: 35 minutes   Dessa Phi, DO [facility] [email or web address] Password [email or web address] 04/17/2018, 2:21 PM

## 2018-04-17 NOTE — Progress Notes (Signed)
Physical Therapy Treatment Patient Details Name: [name] MRN: [id number] DOB: 05/18/1935 Today's Date: 04/17/2018    History of Present Illness 83 y.o. female with a history of CAD s/p stent, HTN, HLD, COPD, GERD, and depression who presented to the ED with multiple complaints including abdominal and back pain and shortness of breath.. Admitted for COPD exacerbation    PT Comments    Pt very motivated to work with therapy, stating she wants to walk as much as she can while physical therapy is here. Pt ambulated 48 ft in room 3x, with seated rest break inbetween each trial to allow for SpO2 recover. Pt ambulated on 4L O2 with SpO2 dropping into mid 80's with activity. Cues for pursed lipped breathing and rest allowed pt's SpO2 to return to >90%.  Will continue to follow acutely to maximize pt's functional independence and activity tolerance.    Follow Up Recommendations  Home health PT;Supervision for mobility/OOB     Equipment Recommendations  None recommended by PT    Recommendations for Other Services       Precautions / Restrictions Precautions Precautions: Fall;Other (comment) Precaution Comments: monitor sats Restrictions Weight Bearing Restrictions: No    Mobility  Bed Mobility               General bed mobility comments: in chair on arrival  Transfers Overall transfer level: Needs assistance Equipment used: Rolling walker (2 wheeled);None Transfers: Sit to/from Stand Sit to Stand: Min guard         General transfer comment: min guard for safety and cues for RW proximity and hand placement  Ambulation/Gait Ambulation/Gait assistance: Min guard Ambulation Distance (Feet): 48 Feet(58ft x3) Assistive device: Rolling walker (2 wheeled) Gait Pattern/deviations: Step-through pattern     General Gait Details: Pt ambulated laps in room with RW and min guard. On 4L O2, SpO2 occasionally dropped into min 80's. Returned to >90% with rest and cues for  pursed lipped breathing. Pt required one seated rest break. Stated that her ears felt plugged up while ambulating.   Stairs             Wheelchair Mobility    Modified Rankin (Stroke Patients Only)       Balance Overall balance assessment: Needs assistance Sitting-balance support: Single extremity supported Sitting balance-Leahy Scale: Good     Standing balance support: Bilateral upper extremity supported Standing balance-Leahy Scale: Fair Standing balance comment: able to statically stand w/o UE support. Required support for ambulation.                            Cognition Arousal/Alertness: Awake/alert Behavior During Therapy: WFL for tasks assessed/performed Overall Cognitive Status: Within Functional Limits for tasks assessed                                        Exercises      General Comments        Pertinent Vitals/Pain Pain Assessment: No/denies pain    Home Living                      Prior Function            PT Goals (current goals can now be found in the care plan section) Acute Rehab PT Goals Patient Stated Goal: to get better PT Goal Formulation: With patient Time  For Goal Achievement: 04/30/18 Potential to Achieve Goals: Good Progress towards PT goals: Progressing toward goals    Frequency    Min 3X/week      PT Plan Current plan remains appropriate    Co-evaluation              AM-PAC PT "6 Clicks" Daily Activity  Outcome Measure  Difficulty turning over in bed (including adjusting bedclothes, sheets and blankets)?: A Little Difficulty moving from lying on back to sitting on the side of the bed? : A Little Difficulty sitting down on and standing up from a chair with arms (e.g., wheelchair, bedside commode, etc,.)?: A Little Help needed moving to and from a bed to chair (including a wheelchair)?: A Little Help needed walking in hospital room?: A Little Help needed climbing 3-5  steps with a railing? : A Lot 6 Click Score: 17    End of Session Equipment Utilized During Treatment: Gait belt;Oxygen Activity Tolerance: Patient tolerated treatment well;Patient limited by fatigue Patient left: with call bell/phone within reach;in chair Nurse Communication: Mobility status PT Visit Diagnosis: Difficulty in walking, not elsewhere classified (R26.2)     Time: 0388-8280 PT Time Calculation (min) (ACUTE ONLY): 21 min  Charges:  $Gait Training: 8-22 mins                    G Codes:      Benjiman Core, [place] Pager [phone] Acute Rehab  Allena Katz 04/17/2018, 3:21 PM

## 2018-04-18 LAB — BASIC METABOLIC PANEL
ANION GAP: 10 (ref 5–15)
BUN: 31 mg/dL — ABNORMAL HIGH (ref 6–20)
CALCIUM: 8.8 mg/dL — AB (ref 8.9–10.3)
CHLORIDE: 97 mmol/L — AB (ref 101–111)
CO2: 28 mmol/L (ref 22–32)
Creatinine, Ser: 0.94 mg/dL (ref 0.44–1.00)
GFR calc non Af Amer: 55 mL/min — ABNORMAL LOW (ref >60)
Glucose, Bld: 145 mg/dL — ABNORMAL HIGH (ref 65–99)
Potassium: 4 mmol/L (ref 3.5–5.1)
SODIUM: 135 mmol/L (ref 135–145)

## 2018-04-18 LAB — CBC
HEMATOCRIT: 40.1 % (ref 36.0–46.0)
HEMOGLOBIN: 13.2 g/dL (ref 12.0–15.0)
MCH: 30.6 pg (ref 26.0–34.0)
MCHC: 32.9 g/dL (ref 30.0–36.0)
MCV: 92.8 fL (ref 78.0–100.0)
Platelets: 266 10*3/uL (ref 150–400)
RBC: 4.32 MIL/uL (ref 3.87–5.11)
RDW: 12.1 % (ref 11.5–15.5)
WBC: 11.6 10*3/uL — AB (ref 4.0–10.5)

## 2018-04-18 NOTE — Progress Notes (Signed)
PROGRESS NOTE    [name]  PXT:[id number] DOB: 1934-11-22 DOA: 04/14/2018 PCP: Leonard Downing, MD     Brief Narrative:  [name] is an 83 y.o. female with a history of CAD s/p stent, HTN, HLD, COPD, GERD, and depression who presented to the ED with multiple complaints including abdominal and back pain and shortness of breath. She reported chronic constipation and recently having multiple loose stools per day after treating this with medications at home. She has appeared to have a vague and inconsistent history, but felt she was growing more short of breath, tachypneic and wheezing in the ED. Work up for abdominal pain included a CT which demonstrated a posterior hernia with fat stranding thought to be a possible cause of pain; general surgery did not recommend any inpatient surgical intervention. She was admitted for COPD exacerbation.    New events last 24 hours / Subjective: No acute events overnight. Currently on 3 L nasal cannula O2.  States that she does not feel like herself.  Her breathing is not quite at baseline.  Denies any abdominal pain.  Assessment & Plan:   Principal Problem:   COPD exacerbation ([place]) Active Problems:   Coronary atherosclerosis   HTN (hypertension)   Abdominal pain   Depression   GERD (gastroesophageal reflux disease)   Back pain   Right flank pain   Acute on chronic hypoxic respiratory failure:  - Continue supplemental oxygen prn, currently up to 3L O2 (from baseline of 2L [place] O2)   COPD exacerbation - Continue prednisone and taper at discharge  - Continue nebs scheduled and prn  Abdominal pain - Possibly due to bloating with over treatment/correction of constipation (though still w/stool burden on CT). No obstructing stone on CT renal stone study. Diverticulosis without inflammation, and stone/sludge in GB without inflammation also noted. Has PVD but symptoms not consistent with ischemic colitis. Lipase normal.   Right  posterior fat herniation - No bowel involvement. Dr. Bonner Puna discussed with general surgery, Dr. Ninfa Linden who has never encountered a case like this. With only fat involvement, plan to observe for now. Follow up with PCP and consider back surgery referral if treatment for fibromyalgia/other chronic pain does not improve this back/flank pain.  Back pain/flank pain - Possibly due to posterior hernia, though with multiple tender points and "pain all over" it is possible that she has fibromyalgia.  - Recommend PCP follow up and evaluation for fibromyalgia in outpatient setting.   CAD s/p PCI: No anginal complaints.  - Continue home ASA, coreg, imdur - NTG prn  HTN:  - Continue imdur, coreg  Depression: Stable.  - Continue amitriptyline    DVT prophylaxis: Lovenox Code Status: Full Family Communication: No family at bedside Disposition Plan: Pending improvement in respiratory status. Home health ordered.    Consultants:   None  Procedures:   None   Antimicrobials:  Anti-infectives (From admission, onward)   None       Objective: Vitals:   04/17/18 2057 04/18/18 0608 04/18/18 0813 04/18/18 1032  BP:  (!) 164/82    Pulse:  65  74  Resp:  16    Temp:  (!) 97.4 F (36.3 C)    TempSrc:  Oral    SpO2: 97% 97% 96%   Weight:      Height:        Intake/Output Summary (Last 24 hours) at 04/18/2018 1258 Last data filed at 04/17/2018 1320 Gross per 24 hour  Intake 220 ml  Output -  Net 220 ml   Filed Weights   04/15/18 1706  Weight: 89.5 kg (197 lb 4.8 oz)    Examination: General exam: Appears calm and comfortable  Respiratory system: Expiratory wheezes bilaterally. Respiratory effort normal.  On 3 L nasal cannula O2 without conversational dyspnea Cardiovascular system: S1 & S2 heard, RRR. No JVD, murmurs, rubs, gallops or clicks. No pedal edema. Gastrointestinal system: Abdomen is nondistended, soft and nontender. No organomegaly or masses felt. Normal bowel sounds  heard. Central nervous system: Alert and oriented. No focal neurological deficits. Extremities: Symmetric 5 x 5 power. Skin: No rashes, lesions or ulcers Psychiatry: Judgement and insight appear normal. Mood & affect appropriate.   Data Reviewed: I have personally reviewed following labs and imaging studies  CBC: Recent Labs  Lab 04/14/18 1043 04/18/18 0509  WBC 8.4 11.6*  HGB 12.7 13.2  HCT 39.3 40.1  MCV 93.1 92.8  PLT 257 742   Basic Metabolic Panel: Recent Labs  Lab 04/14/18 1043 04/18/18 0509  NA 139 135  K 3.9 4.0  CL 103 97*  CO2 28 28  GLUCOSE 119* 145*  BUN 12 31*  CREATININE 1.06* 0.94  CALCIUM 9.4 8.8*   GFR: Estimated Creatinine Clearance: 50.8 mL/min (by C-G formula based on SCr of 0.94 mg/dL). Liver Function Tests: Recent Labs  Lab 04/14/18 1043  AST 18  ALT 14  ALKPHOS 54  BILITOT 0.5  PROT 6.6  ALBUMIN 3.8   Recent Labs  Lab 04/14/18 1043  LIPASE 34   No results for input(s): AMMONIA in the last 168 hours. Coagulation Profile: No results for input(s): INR, PROTIME in the last 168 hours. Cardiac Enzymes: Recent Labs  Lab 04/14/18 1050 04/14/18 1420  TROPONINI <0.03 <0.03   BNP (last 3 results) No results for input(s): PROBNP in the last 8760 hours. HbA1C: No results for input(s): HGBA1C in the last 72 hours. CBG: No results for input(s): GLUCAP in the last 168 hours. Lipid Profile: No results for input(s): CHOL, HDL, LDLCALC, TRIG, CHOLHDL, LDLDIRECT in the last 72 hours. Thyroid Function Tests: No results for input(s): TSH, T4TOTAL, FREET4, T3FREE, THYROIDAB in the last 72 hours. Anemia Panel: No results for input(s): VITAMINB12, FOLATE, FERRITIN, TIBC, IRON, RETICCTPCT in the last 72 hours. Sepsis Labs: No results for input(s): PROCALCITON, LATICACIDVEN in the last 168 hours.  Recent Results (from the past 240 hour(s))  Urine culture     Status: Abnormal   Collection Time: 04/14/18  4:31 PM  Result Value Ref Range Status     Specimen Description URINE, RANDOM  Final   Special Requests   Final    NONE Performed at [facility], [address] [place], [place] [address]    Culture MULTIPLE SPECIES PRESENT, SUGGEST RECOLLECTION (A)  Final   Report Status 04/16/2018 FINAL  Final  Respiratory Panel by PCR     Status: None   Collection Time: 04/14/18 11:25 PM  Result Value Ref Range Status   Adenovirus NOT DETECTED NOT DETECTED Final   Coronavirus 229E NOT DETECTED NOT DETECTED Final   Coronavirus HKU1 NOT DETECTED NOT DETECTED Final   Coronavirus NL63 NOT DETECTED NOT DETECTED Final   Coronavirus OC43 NOT DETECTED NOT DETECTED Final   Metapneumovirus NOT DETECTED NOT DETECTED Final   Rhinovirus / Enterovirus NOT DETECTED NOT DETECTED Final   Influenza A NOT DETECTED NOT DETECTED Final   Influenza B NOT DETECTED NOT DETECTED Final   Parainfluenza Virus 1 NOT DETECTED NOT DETECTED  Final   Parainfluenza Virus 2 NOT DETECTED NOT DETECTED Final   Parainfluenza Virus 3 NOT DETECTED NOT DETECTED Final   Parainfluenza Virus 4 NOT DETECTED NOT DETECTED Final   Respiratory Syncytial Virus NOT DETECTED NOT DETECTED Final   Bordetella pertussis NOT DETECTED NOT DETECTED Final   Chlamydophila pneumoniae NOT DETECTED NOT DETECTED Final   Mycoplasma pneumoniae NOT DETECTED NOT DETECTED Final  Culture, blood (routine x 2) Call MD if unable to obtain prior to antibiotics being given     Status: None (Preliminary result)   Collection Time: 04/15/18  1:05 AM  Result Value Ref Range Status   Specimen Description BLOOD RIGHT ANTECUBITAL  Final   Special Requests   Final    BOTTLES DRAWN AEROBIC AND ANAEROBIC Blood Culture adequate volume   Culture   Final    NO GROWTH 2 DAYS Performed at [facility], [address], [place], [place] [address]    Report Status PENDING  Incomplete  Culture, blood (routine x 2) Call MD if unable to obtain prior to antibiotics being given     Status: None (Preliminary result)    Collection Time: 04/15/18  1:10 AM  Result Value Ref Range Status   Specimen Description BLOOD LEFT ANTECUBITAL  Final   Special Requests   Final    BOTTLES DRAWN AEROBIC AND ANAEROBIC Blood Culture adequate volume   Culture   Final    NO GROWTH 2 DAYS Performed at [facility], [place] [address], [place], [place] [address]    Report Status PENDING  Incomplete       Radiology Studies: No results found.    Scheduled Meds: . arformoterol  15 mcg Nebulization BID  . carvedilol  3.125 mg Oral BID WC  . cholecalciferol  1,000 Units Oral Daily  . enoxaparin (LOVENOX) injection  40 mg Subcutaneous Q24H  . famotidine  40 mg Oral QHS  . isosorbide mononitrate  120 mg Oral Daily  . lubiprostone  24 mcg Oral BID WC  . predniSONE  50 mg Oral Q breakfast   Continuous Infusions:   LOS: 4 days    Time spent: 25 minutes   Dessa Phi, DO [facility] [email or web address] Password [email or web address] 04/18/2018, 12:58 PM

## 2018-04-18 NOTE — Care Management Note (Signed)
Case Management Note  Patient Details  Name: [name] MRN: [id number] Date of Birth: 08/07/1935  Subjective/Objective:     Pt presents for COPD exacerbation.     Pt active with [facility] and requests RW.           Action/Plan: [facility] aware of pt resuming HH.  RW ordered from [facility].  Expected Discharge Date:        04/19/18          Expected Discharge Plan:  [place]  In-House Referral:     Discharge planning Services  CM Consult  Post Acute Care Choice:    Choice offered to:  Patient  DME Arranged:  [facility] Agency:  [place] Arranged:  PT, OT [facility] Agency:  [place]  Status of Service:  Completed, signed off  If discussed at [place] of Stay Meetings, dates discussed:    Additional Comments:  Claudie Leach, RN 04/18/2018, 3:16 PM

## 2018-04-18 NOTE — Progress Notes (Signed)
Patient had an uneventful night.

## 2018-04-19 MED ORDER — PREDNISONE 10 MG PO TABS: ORAL_TABLET | ORAL | 0 refills | Status: DC

## 2018-04-19 MED ORDER — ORAL CARE MOUTH RINSE: 15.0000 mL | Freq: Two times a day (BID) | OROMUCOSAL | Status: DC

## 2018-04-19 NOTE — Discharge Summary (Signed)
Physician Discharge Summary  [name] LAG:[id number] DOB: June 09, 1935 DOA: 04/14/2018  PCP: [name] Downing, MD  Admit date: 04/14/2018 Discharge date: 04/19/2018  Admitted From: Home Disposition:  Home  Recommendations for Outpatient Follow-up:  1. Follow up with PCP in 1 week  Home Health: PT OT   Equipment/Devices: Rolling walker   Discharge Condition: Stable CODE STATUS: Full  Diet recommendation: Heart healthy   Brief/Interim Summary: [name] 83 y.o.femalewith a history of CAD s/p stent, HTN, HLD, COPD, GERD, and depression who presented to the ED with multiple complaints including abdominal and back pain and shortness of breath. She reported chronic constipation and recently having multiple loose stools per day after treating this with medications at home. She has appeared to have a vague and inconsistent history, but felt she was growing more short of breath, tachypneic and wheezing in the ED. Work up for abdominal pain included a CT which demonstrated a posterior hernia with fat stranding thought to be a possible cause of pain; general surgery did not recommend any inpatient surgical intervention. She was admitted for COPD exacerbation. She improved slowly with steroid treatment.   Discharge Diagnoses:  Principal Problem:   COPD exacerbation ([place]) Active Problems:   Coronary atherosclerosis   HTN (hypertension)   Abdominal pain   Depression   GERD (gastroesophageal reflux disease)   Back pain   Right flank pain   Acute on chronic hypoxic respiratory failure:  - Continue supplemental oxygen. Uses baseline of 2L [place] O2   COPD exacerbation - Continue prednisone and taper at discharge  - Continue nebs scheduled and prn  Abdominal pain - Possibly due to bloating with over treatment/correction of constipation (though still w/stool burden on CT). No obstructing stone on CT renal stone study. Diverticulosis without inflammation, and stone/sludge in GB  without inflammation also noted. Has PVD but symptoms not consistent with ischemic colitis. Lipase normal.   Right posteriorfat herniation - No bowel involvement. Dr. Bonner Puna discussed with general surgery, Dr. Ninfa Linden who has never encountered a case like this. With only fat involvement, plan to observe for now. Follow up with PCP and consider back surgery referral if treatment for fibromyalgia/other chronic pain does not improve this back/flank pain.  Back pain/flank pain - Possibly due to posterior hernia, though with multiple tender points and "pain all over" it is possible that she has fibromyalgia.  - Recommend PCP follow up and evaluation for fibromyalgia in outpatient setting.   CAD s/p PCI: No anginal complaints.  - Continue home ASA, coreg, imdur - NTG prn  HTN:  - Continue imdur, coreg  Depression: Stable.  - Continue amitriptyline     Discharge Instructions  Discharge Instructions    Call MD for:  difficulty breathing, headache or visual disturbances   Complete by:  As directed    Call MD for:  extreme fatigue   Complete by:  As directed    Call MD for:  hives   Complete by:  As directed    Call MD for:  persistant dizziness or light-headedness   Complete by:  As directed    Call MD for:  persistant nausea and vomiting   Complete by:  As directed    Call MD for:  severe uncontrolled pain   Complete by:  As directed    Call MD for:  temperature >100.4   Complete by:  As directed    Diet - low sodium heart healthy   Complete by:  As directed  Discharge instructions   Complete by:  As directed    You were cared for by a hospitalist during your hospital stay. If you have any questions about your discharge medications or the care you received while you were in the hospital after you are discharged, you can call the unit and ask to speak with the hospitalist on call if the hospitalist that took care of you is not available. Once you are discharged, your primary  care physician will handle any further medical issues. Please note that NO REFILLS for any discharge medications will be authorized once you are discharged, as it is imperative that you return to your primary care physician (or establish a relationship with a primary care physician if you do not have one) for your aftercare needs so that they can reassess your need for medications and monitor your lab values.   Increase activity slowly   Complete by:  As directed      Allergies as of 04/19/2018      Reactions   Iohexol Other (See Comments)   Code:  HIVES, Desc:  PER ROBIN @ GI, PT IS ALLERGIC TO IVP DYE 08/28/10 RM   Lipitor [atorvastatin Calcium] Hives      Medication List    STOP taking these medications   amLODipine 10 MG tablet Commonly known as:  NORVASC     TAKE these medications   acetaminophen 650 MG CR tablet Commonly known as:  TYLENOL 8 HOUR Take 1 tablet (650 mg total) by mouth every 8 (eight) hours as needed.   albuterol 108 (90 Base) MCG/ACT inhaler Commonly known as:  PROVENTIL HFA;VENTOLIN HFA Inhale 2 puffs into the lungs every 4 (four) hours as needed for wheezing.   amitriptyline 10 MG tablet Commonly known as:  ELAVIL Take 10-20 mg by mouth at bedtime as needed for sleep (pain).   arformoterol 15 MCG/2ML Nebu Commonly known as:  BROVANA Take 2 mLs (15 mcg total) by nebulization 2 (two) times daily.   aspirin 81 MG tablet Take 1 tablet (81 mg total) by mouth daily.   budesonide 0.25 MG/2ML nebulizer solution Commonly known as:  PULMICORT Take 2 mLs (0.25 mg total) by nebulization 2 (two) times daily. What changed:  when to take this   carvedilol 3.125 MG tablet Commonly known as:  COREG Take 1 tablet (3.125 mg total) by mouth 2 (two) times daily with a meal.   cholecalciferol 1000 units tablet Commonly known as:  VITAMIN D Take 1,000 Units by mouth daily.   dicyclomine 20 MG tablet Commonly known as:  BENTYL Take 1 tablet (20 mg total) by mouth 3  (three) times daily as needed for spasms. Cramping abdominal pain.   docusate sodium 100 MG capsule Commonly known as:  COLACE Take 1 capsule (100 mg total) by mouth every 12 (twelve) hours.   fluticasone 50 MCG/ACT nasal spray Commonly known as:  FLONASE Place 2 sprays into both nostrils daily as needed for allergies or rhinitis.   gabapentin 100 MG capsule Commonly known as:  NEURONTIN Take 100-200 mg by mouth every 8 (eight) hours as needed (pain).   guaiFENesin 600 MG 12 hr tablet Commonly known as:  MUCINEX Take 1 tablet (600 mg total) by mouth 2 (two) times daily. What changed:    when to take this  reasons to take this   ibuprofen 400 MG tablet Commonly known as:  ADVIL,MOTRIN Take 1 tablet (400 mg total) by mouth every 6 (six) hours as needed.  isosorbide mononitrate 120 MG 24 hr tablet Commonly known as:  IMDUR Take 1 tablet (120 mg total) by mouth daily.   lubiprostone 24 MCG capsule Commonly known as:  AMITIZA Take 1 capsule (24 mcg total) by mouth 2 (two) times daily with a meal.   nitroGLYCERIN 0.4 MG SL tablet Commonly known as:  NITROSTAT Place 0.4 mg under the tongue every 5 (five) minutes as needed for chest pain.   polyethylene glycol packet Commonly known as:  MIRALAX Take 17 g by mouth daily.   predniSONE 10 MG tablet Commonly known as:  DELTASONE Take 4 tabs for 3 days, then 3 tabs for 3 days, then 2 tabs for 3 days, then 1 tab for 3 days, then 1/2 tab for 4 days.   psyllium 28 % packet Commonly known as:  METAMUCIL SMOOTH TEXTURE Take 1 packet by mouth 2 (two) times daily as needed (constipation).   ranitidine 300 MG tablet Commonly known as:  ZANTAC Take 0.5 tablets (150 mg total) by mouth 2 (two) times daily. What changed:    how much to take  when to take this   trolamine salicylate 10 % cream Commonly known as:  ASPERCREME Apply 1 application topically daily as needed for muscle pain.            Durable Medical Equipment   (From admission, onward)        Start     Ordered   04/16/18 1542  For home use only DME Walker rolling  Once    Question:  Patient needs a walker to treat with the following condition  Answer:  Weakness   04/16/18 1541     Follow-up Information    [facility] Follow up.   Specialty:  Home Health Services Contact information: [address] [place] [address] [phone]        [facility] Follow up.   Why:  rolling walker will be delivered to bedside prior to discharge Contact information: [place] [address] [phone]        [name] Downing, MD. Schedule an appointment as soon as possible for a visit in 1 week(s).   Specialty:  Family Medicine Contact information: [place] [address] [phone]          Allergies  Allergen Reactions  . Iohexol Other (See Comments)    Code:  HIVES, Desc:  PER ROBIN @ GI, PT IS ALLERGIC TO IVP DYE 08/28/10 RM  . Lipitor [Atorvastatin Calcium] Hives    Consultations:  None   Procedures/Studies: Dg Abd Acute W/chest  Result Date: 04/14/2018 CLINICAL DATA:  83 year old female with abdominal pain and shortness of breath. EXAM: DG ABDOMEN ACUTE W/ 1V CHEST COMPARISON:  Chest and abdominal series 11/07/2017 and earlier. FINDINGS: Seated upright AP view of the chest. Lower lung volumes. There is cardiomegaly. Other mediastinal contours are within normal limits. visualized tracheal air column is within normal limits. No pneumothorax or pneumoperitoneum. No confluent pulmonary opacity. Stable pulmonary vascularity. Increased small bowel gas in the left abdomen compared to 2018, but overall nonobstructed bowel gas pattern. Chronic postoperative changes to the lumbar spine with stable hardware. No acute osseous abnormality identified. Right side femoral artery calcified atherosclerosis. IMPRESSION: 1.  No acute cardiopulmonary  abnormality. 2. Nonobstructed bowel gas pattern, no free air. Electronically Signed   By: Genevie Ann M.D.   On: 04/14/2018 12:49   Ct Renal  Stone Study  Result Date: 04/14/2018 CLINICAL DATA:  Initial evaluation for acute flank pain. EXAM: CT ABDOMEN AND PELVIS WITHOUT CONTRAST TECHNIQUE: Multidetector CT imaging of the abdomen and pelvis was performed following the standard protocol without IV contrast. COMPARISON:  Prior CT from 03/16/2018 FINDINGS: Lower chest: Scattered atelectatic changes seen dependently within the visualized lung bases. Visualized lungs are otherwise clear. Cardiomegaly partially visualized. Hepatobiliary: Liver demonstrates a normal unenhanced appearance. Layering hyperdensity within the gallbladder lumen suspicious for stones and/or sludge. No imaging findings to suggest acute cholecystitis. No biliary dilatation. Pancreas: Pancreas within normal limits. Spleen: Spleen within normal limits. Adrenals/Urinary Tract: 11 mm left adrenal nodule, most consistent with a small adenoma, stable from previous. Adrenal glands otherwise unremarkable. Bilateral renal cysts arising from the interpolar regions of both kidneys measure up to 2.6 cm on the right and 2 cm on the left. Punctate nonobstructive calculi measuring up to 3 mm present within the upper pole the right kidney. 4 mm nonobstructive stone present within the upper pole of the left kidney. No radiopaque calculi seen along the course of either renal collecting system. No hydronephrosis or hydroureter. Partially distended bladder within normal limits. No layering stones within the bladder lumen. Stomach/Bowel: Stomach within normal limits. No evidence for bowel obstruction. Extensive colonic diverticulosis without evidence for acute diverticulitis. No acute inflammatory changes seen about the bowels. Vascular/Lymphatic: Advanced aorto bi-iliac atherosclerotic disease. Ectatic intra-abdominal aorta measuring up to 2.5 cm without frank  aneurysm. No adenopathy. Reproductive: Uterus is absent.  Ovaries within normal limits. Other: No free air or fluid. Small fat containing paraumbilical hernia noted without associated inflammation. Additional posterior fat containing right-sided hernia noted (series 3, image 38). Minimal hazy fat stranding without significant inflammation. Musculoskeletal: No acute osseous abnormality. No worrisome lytic or blastic osseous lesions. Postsurgical changes from prior PLIF at L1 through L5. Extensive degenerative spondylolysis noted throughout the visualized spine. IMPRESSION: 1. Bilateral nonobstructive nephrolithiasis as above. No CT evidence for obstructive uropathy. 2. Fat containing right posterior hernia with associated mild hazy fat stranding. Findings could potentially result in acute flank pain. Correlation with physical exam recommended. 3. Colonic diverticulosis without evidence for acute diverticulitis. 4. Stones and/or sludge within the gallbladder lumen. No imaging findings to suggest acute cholecystitis. 5. Advanced aorto bi-iliac atherosclerotic disease with ectatic intra-abdominal aorta measuring up to 2.5 cm, stable. Recommendations for follow-up as previously described. 1. Electronically Signed   By: Jeannine Boga M.D.   On: 04/14/2018 18:13      Discharge Exam: Vitals:   04/19/18 0607 04/19/18 0855  BP: (!) 183/89   Pulse: 62 83  Resp:    Temp: 97.7 F (36.5 C)   SpO2: 95%     General: Pt is alert, awake, not in acute distress Cardiovascular: RRR, S1/S2 +, no rubs, no gallops Respiratory: Diminished breath sounds bilaterally, no wheezing, no rhonchi. No distress. On [place] O2.  Abdominal: Soft, NT, ND, bowel sounds + Extremities: no edema, no cyanosis    The results of significant diagnostics from this hospitalization (including imaging, microbiology, ancillary and laboratory) are listed below for reference.     Microbiology: Recent Results (from the past 240 hour(s))   Urine culture     Status: Abnormal   Collection Time: 04/14/18  4:31 PM  Result Value Ref Range Status   Specimen Description URINE, RANDOM  Final   Special Requests   Final    NONE Performed at [facility], [address] [place], [place] [address]  Culture MULTIPLE SPECIES PRESENT, SUGGEST RECOLLECTION (A)  Final   Report Status 04/16/2018 FINAL  Final  Respiratory Panel by PCR     Status: None   Collection Time: 04/14/18 11:25 PM  Result Value Ref Range Status   Adenovirus NOT DETECTED NOT DETECTED Final   Coronavirus 229E NOT DETECTED NOT DETECTED Final   Coronavirus HKU1 NOT DETECTED NOT DETECTED Final   Coronavirus NL63 NOT DETECTED NOT DETECTED Final   Coronavirus OC43 NOT DETECTED NOT DETECTED Final   Metapneumovirus NOT DETECTED NOT DETECTED Final   Rhinovirus / Enterovirus NOT DETECTED NOT DETECTED Final   Influenza A NOT DETECTED NOT DETECTED Final   Influenza B NOT DETECTED NOT DETECTED Final   Parainfluenza Virus 1 NOT DETECTED NOT DETECTED Final   Parainfluenza Virus 2 NOT DETECTED NOT DETECTED Final   Parainfluenza Virus 3 NOT DETECTED NOT DETECTED Final   Parainfluenza Virus 4 NOT DETECTED NOT DETECTED Final   Respiratory Syncytial Virus NOT DETECTED NOT DETECTED Final   Bordetella pertussis NOT DETECTED NOT DETECTED Final   Chlamydophila pneumoniae NOT DETECTED NOT DETECTED Final   Mycoplasma pneumoniae NOT DETECTED NOT DETECTED Final  Culture, blood (routine x 2) Call MD if unable to obtain prior to antibiotics being given     Status: None (Preliminary result)   Collection Time: 04/15/18  1:05 AM  Result Value Ref Range Status   Specimen Description BLOOD RIGHT ANTECUBITAL  Final   Special Requests   Final    BOTTLES DRAWN AEROBIC AND ANAEROBIC Blood Culture adequate volume   Culture   Final    NO GROWTH 3 DAYS Performed at [facility], [address] [place], [place] [address]    Report Status PENDING  Incomplete  Culture, blood (routine  x 2) Call MD if unable to obtain prior to antibiotics being given     Status: None (Preliminary result)   Collection Time: 04/15/18  1:10 AM  Result Value Ref Range Status   Specimen Description BLOOD LEFT ANTECUBITAL  Final   Special Requests   Final    BOTTLES DRAWN AEROBIC AND ANAEROBIC Blood Culture adequate volume   Culture   Final    NO GROWTH 3 DAYS Performed at [facility], [place] [address] [place],  [address]    Report Status PENDING  Incomplete     Labs: BNP (last 3 results) No results for input(s): BNP in the last 8760 hours. Basic Metabolic Panel: Recent Labs  Lab 04/14/18 1043 04/18/18 0509  NA 139 135  K 3.9 4.0  CL 103 97*  CO2 28 28  GLUCOSE 119* 145*  BUN 12 31*  CREATININE 1.06* 0.94  CALCIUM 9.4 8.8*   Liver Function Tests: Recent Labs  Lab 04/14/18 1043  AST 18  ALT 14  ALKPHOS 54  BILITOT 0.5  PROT 6.6  ALBUMIN 3.8   Recent Labs  Lab 04/14/18 1043  LIPASE 34   No results for input(s): AMMONIA in the last 168 hours. CBC: Recent Labs  Lab 04/14/18 1043 04/18/18 0509  WBC 8.4 11.6*  HGB 12.7 13.2  HCT 39.3 40.1  MCV 93.1 92.8  PLT 257 266   Cardiac Enzymes: Recent Labs  Lab 04/14/18 1050 04/14/18 1420  TROPONINI <0.03 <0.03   BNP: Invalid input(s): POCBNP CBG: No results for input(s): GLUCAP in the last 168 hours. D-Dimer No results for input(s): DDIMER in the last 72 hours. Hgb A1c No results for input(s): HGBA1C in the last 72 hours.  Lipid Profile No results for input(s): CHOL, HDL, LDLCALC, TRIG, CHOLHDL, LDLDIRECT in the last 72 hours. Thyroid function studies No results for input(s): TSH, T4TOTAL, T3FREE, THYROIDAB in the last 72 hours.  Invalid input(s): FREET3 Anemia work up No results for input(s): VITAMINB12, FOLATE, FERRITIN, TIBC, IRON, RETICCTPCT in the last 72 hours. Urinalysis    Component Value Date/Time   COLORURINE YELLOW 04/14/2018 1750   APPEARANCEUR CLEAR 04/14/2018 1750   LABSPEC  1.011 04/14/2018 1750   PHURINE 6.0 04/14/2018 1750   GLUCOSEU NEGATIVE 04/14/2018 1750   HGBUR NEGATIVE 04/14/2018 1750   BILIRUBINUR NEGATIVE 04/14/2018 1750   KETONESUR NEGATIVE 04/14/2018 1750   PROTEINUR NEGATIVE 04/14/2018 1750   UROBILINOGEN 0.2 11/28/2014 1557   NITRITE NEGATIVE 04/14/2018 1750   LEUKOCYTESUR NEGATIVE 04/14/2018 1750   Sepsis Labs Invalid input(s): PROCALCITONIN,  WBC,  LACTICIDVEN Microbiology Recent Results (from the past 240 hour(s))  Urine culture     Status: Abnormal   Collection Time: 04/14/18  4:31 PM  Result Value Ref Range Status   Specimen Description URINE, RANDOM  Final   Special Requests   Final    NONE Performed at [facility], [place] [address] [place], [place] [address]    Culture MULTIPLE SPECIES PRESENT, SUGGEST RECOLLECTION (A)  Final   Report Status 04/16/2018 FINAL  Final  Respiratory Panel by PCR     Status: None   Collection Time: 04/14/18 11:25 PM  Result Value Ref Range Status   Adenovirus NOT DETECTED NOT DETECTED Final   Coronavirus 229E NOT DETECTED NOT DETECTED Final   Coronavirus HKU1 NOT DETECTED NOT DETECTED Final   Coronavirus NL63 NOT DETECTED NOT DETECTED Final   Coronavirus OC43 NOT DETECTED NOT DETECTED Final   Metapneumovirus NOT DETECTED NOT DETECTED Final   Rhinovirus / Enterovirus NOT DETECTED NOT DETECTED Final   Influenza A NOT DETECTED NOT DETECTED Final   Influenza B NOT DETECTED NOT DETECTED Final   Parainfluenza Virus 1 NOT DETECTED NOT DETECTED Final   Parainfluenza Virus 2 NOT DETECTED NOT DETECTED Final   Parainfluenza Virus 3 NOT DETECTED NOT DETECTED Final   Parainfluenza Virus 4 NOT DETECTED NOT DETECTED Final   Respiratory Syncytial Virus NOT DETECTED NOT DETECTED Final   Bordetella pertussis NOT DETECTED NOT DETECTED Final   Chlamydophila pneumoniae NOT DETECTED NOT DETECTED Final   Mycoplasma pneumoniae NOT DETECTED NOT DETECTED Final  Culture, blood (routine x 2) Call MD if unable to  obtain prior to antibiotics being given     Status: None (Preliminary result)   Collection Time: 04/15/18  1:05 AM  Result Value Ref Range Status   Specimen Description BLOOD RIGHT ANTECUBITAL  Final   Special Requests   Final    BOTTLES DRAWN AEROBIC AND ANAEROBIC Blood Culture adequate volume   Culture   Final    NO GROWTH 3 DAYS Performed at [facility], [address], [place], [place] [address]    Report Status PENDING  Incomplete  Culture, blood (routine x 2) Call MD if unable to obtain prior to antibiotics being given     Status: None (Preliminary result)   Collection Time: 04/15/18  1:10 AM  Result Value Ref Range Status   Specimen Description BLOOD LEFT ANTECUBITAL  Final   Special Requests   Final    BOTTLES DRAWN AEROBIC AND ANAEROBIC Blood Culture adequate volume   Culture   Final    NO GROWTH 3 DAYS Performed at [facility], [place]  [address] [place], [place] [address]    Report Status PENDING  Incomplete     Patient was seen and examined on the day of discharge and was found to be in stable condition. Time coordinating discharge: 25 minutes including assessment and coordination of care, as well as examination of the patient.   SIGNED:  Dessa Phi, DO [facility] Pager [phone]  If 7PM-7AM, please contact night-coverage [email or web address] Password [email or web address] 04/19/2018, 10:01 AM

## 2018-04-19 NOTE — Progress Notes (Signed)
Pt in room with family, receiving discharge instructions and ready to leave.

## 2018-04-20 LAB — CULTURE, BLOOD (ROUTINE X 2)
CULTURE: NO GROWTH
CULTURE: NO GROWTH
Special Requests: ADEQUATE
Special Requests: ADEQUATE

## 2018-08-20 ENCOUNTER — Ambulatory Visit (INDEPENDENT_AMBULATORY_CARE_PROVIDER_SITE_OTHER)
Admission: RE | Admit: 2018-08-20 | Discharge: 2018-08-20 | Disposition: A | Discharge status: Home / Self Care | Payer: Medicare Other | Source: Ambulatory Visit | Attending: Pulmonary Disease | Admitting: Pulmonary Disease

## 2018-08-20 ENCOUNTER — Ambulatory Visit (INDEPENDENT_AMBULATORY_CARE_PROVIDER_SITE_OTHER): Payer: Medicare Other | Admitting: Pulmonary Disease

## 2018-08-20 ENCOUNTER — Other Ambulatory Visit (INDEPENDENT_AMBULATORY_CARE_PROVIDER_SITE_OTHER): Payer: Medicare Other

## 2018-08-20 ENCOUNTER — Encounter: Payer: Self-pay | Admitting: Pulmonary Disease

## 2018-08-20 VITALS — BP 126/68 | HR 74 | Ht 64.0 in | Wt 201.0 lb

## 2018-08-20 DIAGNOSIS — J449 Chronic obstructive pulmonary disease, unspecified: Secondary | ICD-10-CM

## 2018-08-20 DIAGNOSIS — J441 Chronic obstructive pulmonary disease with (acute) exacerbation: Secondary | ICD-10-CM | POA: Diagnosis not present

## 2018-08-20 DIAGNOSIS — Z23 Encounter for immunization: Secondary | ICD-10-CM | POA: Diagnosis not present

## 2018-08-20 DIAGNOSIS — I25119 Atherosclerotic heart disease of native coronary artery with unspecified angina pectoris: Secondary | ICD-10-CM | POA: Diagnosis not present

## 2018-08-20 LAB — CBC WITH DIFFERENTIAL/PLATELET
BASOS PCT: 0.3 % (ref 0.0–3.0)
Basophils Absolute: 0 10*3/uL (ref 0.0–0.1)
EOS ABS: 0.1 10*3/uL (ref 0.0–0.7)
Eosinophils Relative: 1.1 % (ref 0.0–5.0)
HCT: 41.4 % (ref 36.0–46.0)
HEMOGLOBIN: 13.7 g/dL (ref 12.0–15.0)
LYMPHS ABS: 1.6 10*3/uL (ref 0.7–4.0)
Lymphocytes Relative: 25.5 % (ref 12.0–46.0)
MCHC: 33 g/dL (ref 30.0–36.0)
MCV: 90.8 fl (ref 78.0–100.0)
MONO ABS: 0.8 10*3/uL (ref 0.1–1.0)
Monocytes Relative: 13.3 % — ABNORMAL HIGH (ref 3.0–12.0)
NEUTROS PCT: 59.8 % (ref 43.0–77.0)
Neutro Abs: 3.7 10*3/uL (ref 1.4–7.7)
PLATELETS: 207 10*3/uL (ref 150.0–400.0)
RBC: 4.56 Mil/uL (ref 3.87–5.11)
RDW: 12.5 % (ref 11.5–15.5)
WBC: 6.1 10*3/uL (ref 4.0–10.5)

## 2018-08-20 MED ORDER — REVEFENACIN 175 MCG/3ML IN SOLN: 3.0000 mL | Freq: Every day | RESPIRATORY_TRACT | 11 refills | Status: DC

## 2018-08-20 NOTE — Patient Instructions (Signed)
Continue Brovana, Pulmicort with albuterol as needed We will add Yupelri nebs Chest x-ray today Continue supplemental oxygen Flu shot today  Follow-up in 3 months.

## 2018-08-20 NOTE — Addendum Note (Signed)
Addended by: Maryanna Shape A on: 08/20/2018 02:24 PM   Modules accepted: Orders

## 2018-08-20 NOTE — Progress Notes (Signed)
[name]    [id number]    May 08, 1935  Primary Care Physician:Elkins, [name], MD  Referring Physician: Leonard Downing, MD [address] [place], [place] [address]  Chief complaint:  Follow up for COPD GOLD D (CAT score 32, 2 hospitalizations over past year)  HPI:  [name] is a 83 year old with past history as below. She has history of coronary artery disease with multiple stents. She had a stress test in January 2016 which was negative. She continues to complain of dyspnea and has been referred here by her cardiologist for further evaluation. She was started on supplemental oxygen at 2 L/m 2 weeks ago for desaturations. Her chief complaints are dyspnea on exertion. She denies any dyspnea at rest, cough, sputum production, wheezing. She has about 30-pack-year smoking history and quit in 2016.  November 2018- She had a hospital admission for dyspnea, abdominal pain.  Chest x-ray at that time does not show any acute process.  Treated for COPD exacerbation with BiPAP, Solu-Medrol.  Evaluated for abdominal pain of unclear etiology with negative CT of the abdomen.  GI evaluation was also non diagnostic.  Interim history Admitted in June 2019 for similar complaints of abdominal pain, dyspnea.  CT abdomen shows posterior hernia with fat stranding.  CT surgery did not recommend any intervention.  Treated for COPD exacerbation with steroids.  Continues on Brovana, Pulmicort.  Reports dyspnea on exertion, cough with mucus production, wheezing  Outpatient Encounter Medications as of 08/20/2018  Medication Sig  . acetaminophen (TYLENOL 8 HOUR) 650 MG CR tablet Take 1 tablet (650 mg total) by mouth every 8 (eight) hours as needed.  Marland Kitchen albuterol (PROVENTIL HFA;VENTOLIN HFA) 108 (90 BASE) MCG/ACT inhaler Inhale 2 puffs into the lungs every 4 (four) hours as needed for wheezing.  Marland Kitchen amitriptyline (ELAVIL) 10 MG tablet Take 10-20 mg by mouth at bedtime as needed for  sleep (pain).  Marland Kitchen arformoterol (BROVANA) 15 MCG/2ML NEBU Take 2 mLs (15 mcg total) by nebulization 2 (two) times daily.  Marland Kitchen aspirin 81 MG tablet Take 1 tablet (81 mg total) by mouth daily.  . budesonide (PULMICORT) 0.25 MG/2ML nebulizer solution Take 2 mLs (0.25 mg total) by nebulization 2 (two) times daily. (Patient taking differently: Take 0.25 mg by nebulization 4 (four) times daily. )  . carvedilol (COREG) 3.125 MG tablet Take 1 tablet (3.125 mg total) by mouth 2 (two) times daily with a meal.  . cholecalciferol (VITAMIN D) 1000 UNITS tablet Take 1,000 Units by mouth daily.   Marland Kitchen dicyclomine (BENTYL) 20 MG tablet Take 1 tablet (20 mg total) by mouth 3 (three) times daily as needed for spasms. Cramping abdominal pain.  Marland Kitchen docusate sodium (COLACE) 100 MG capsule Take 1 capsule (100 mg total) by mouth every 12 (twelve) hours.  . fluticasone (FLONASE) 50 MCG/ACT nasal spray Place 2 sprays into both nostrils daily as needed for allergies or rhinitis.  Marland Kitchen gabapentin (NEURONTIN) 100 MG capsule Take 300 mg by mouth every 8 (eight) hours as needed (pain).   Marland Kitchen guaiFENesin (MUCINEX) 600 MG 12 hr tablet Take 1 tablet (600 mg total) by mouth 2 (two) times daily. (Patient taking differently: Take 600 mg by mouth 2 (two) times daily as needed for to loosen phlegm. )  . ibuprofen (ADVIL,MOTRIN) 400 MG tablet Take 1 tablet (400 mg total) by mouth every 6 (six) hours as needed.  . isosorbide mononitrate (IMDUR) 120 MG 24 hr tablet Take 1 tablet (120  mg total) by mouth daily.  Marland Kitchen lubiprostone (AMITIZA) 24 MCG capsule Take 1 capsule (24 mcg total) by mouth 2 (two) times daily with a meal.  . nitroGLYCERIN (NITROSTAT) 0.4 MG SL tablet Place 0.4 mg under the tongue every 5 (five) minutes as needed for chest pain.  . polyethylene glycol (MIRALAX) packet Take 17 g by mouth daily.  . psyllium (METAMUCIL SMOOTH TEXTURE) 28 % packet Take 1 packet by mouth 2 (two) times daily as needed (constipation).   . ranitidine (ZANTAC) 300  MG tablet Take 0.5 tablets (150 mg total) by mouth 2 (two) times daily. (Patient taking differently: Take 300 mg by mouth at bedtime. )  . trolamine salicylate (ASPERCREME) 10 % cream Apply 1 application topically daily as needed for muscle pain.  . [DISCONTINUED] predniSONE (DELTASONE) 10 MG tablet Take 4 tabs for 3 days, then 3 tabs for 3 days, then 2 tabs for 3 days, then 1 tab for 3 days, then 1/2 tab for 4 days.  . [DISCONTINUED] calcium carbonate (OS-CAL) 600 MG TABS Take 600 mg by mouth daily.    . [DISCONTINUED] pravastatin (PRAVACHOL) 40 MG tablet Take 40 mg by mouth daily.     No facility-administered encounter medications on file as of 08/20/2018.    Physical Exam: Blood pressure 126/68, pulse 74, height 5\' 4"  (1.626 m), weight 201 lb (91.2 kg), SpO2 95 %. Gen:      No acute distress HEENT:  EOMI, sclera anicteric Neck:     No masses; no thyromegaly Lungs:    Scattered crackles, no wheeze. CV:         Regular rate and rhythm; no murmurs Abd:      + bowel sounds; soft, non-tender; no palpable masses, no distension Ext:    No edema; adequate peripheral perfusion Skin:      Warm and dry; no rash Neuro: alert and oriented x 3 Psych: normal mood and affect  Data Reviewed: Imaging Abdominal CT 12/26/16-right diaphragm hernia, and basilar atelectasis or mild scarring Abdominal CT 10/06/17-bibasilar atelectasis Abdominal CT 11/07/17-stable bibasilar atelectasis Abdominal CT 02/24/2018-stable basal atelectasis, right diaphragm hernia. Abdominal CT 04/14/2018- stable lower lung findings. Chest x-ray 04/14/2018-clear lungs. I have reviewed the images personally.  PFTs 10/27/16 FVC 1.82 (66%),FEV1 0.92 (44%), F/F 50, TLC 82%, DLCO 19% Severe obstructive airway disease with broncho dilator response Severe diffusion defect  Labs: CBC 02/24/2018-WBC 9.4, eos 1%, absolute eosinophil count 94  Assessment:  COPD GOLD D Severe COPD with a bronchodilator response.  Appears to have recovered  from her recent COPD exacerbation.  Continue on Brovana and Pulmicort with albuterol as needed Will add LAMA yupelri nebs Check CBC with differential, IgE levels and alpha-1 antitrypsin levels  Follow-up chest x-ray today Continue supplemental oxygen.  Assess for portable concentrator  Health maintenance Flu vaccine today Pneumovax at return visit.  Plan/Recommendations: - Continue Brovana, pulmicort - Add Yupelri nebs - Flu vaccination - Check CBC with diff, IgE, A1AT levels  Return in 3 months.   Marshell Garfinkel MD [facility] 08/20/2018, 1:50 PM  CC: Leonard Downing, *

## 2018-08-20 NOTE — Progress Notes (Signed)
Inhaler training given. In-check peak flow #:30

## 2018-08-21 ENCOUNTER — Telehealth: Payer: Self-pay | Admitting: Pulmonary Disease

## 2018-08-21 NOTE — Telephone Encounter (Signed)
Patient called left message on answering machine to call Amanda back at [facility].  Or to call office if any questions.   Follow up with Estill Bamberg and patient next week

## 2018-08-24 NOTE — Telephone Encounter (Signed)
LMTCB. Attempted to contact daughter (emergency contact) voicemail has not been set up yet.

## 2018-08-25 NOTE — Telephone Encounter (Signed)
Called number provided with patients chart. Spoke with patients son in [employer]. He stated that he would give [place] another number to reach patient. Called number given by son in law [phone] unable to reach patient left message to give [place] a call back.

## 2018-08-26 LAB — ALPHA-1 ANTITRYPSIN PHENOTYPE: A-1 Antitrypsin, Ser: 177 mg/dL (ref 83–199)

## 2018-08-26 LAB — IGE: IgE (Immunoglobulin E), Serum: 88 kU/L (ref <=114)

## 2018-08-26 NOTE — Telephone Encounter (Signed)
Called [employer].  [name] is still unable to reach Patient at this time.  [name] stated that she has left messages for Patient to return call. Left message on Patient's VM [phone], to call back.

## 2018-08-27 ENCOUNTER — Other Ambulatory Visit: Payer: Self-pay

## 2018-08-27 ENCOUNTER — Emergency Department (HOSPITAL_COMMUNITY): Payer: Medicare Other

## 2018-08-27 ENCOUNTER — Encounter (HOSPITAL_COMMUNITY): Payer: Self-pay | Admitting: Emergency Medicine

## 2018-08-27 ENCOUNTER — Inpatient Hospital Stay (HOSPITAL_COMMUNITY)
Admission: EM | Admit: 2018-08-27 | Discharge: 2018-09-01 | DRG: 280 | Disposition: A | Discharge status: Home Health | Payer: Medicare Other | Attending: Family Medicine | Admitting: Family Medicine

## 2018-08-27 DIAGNOSIS — I2510 Atherosclerotic heart disease of native coronary artery without angina pectoris: Secondary | ICD-10-CM | POA: Diagnosis present

## 2018-08-27 DIAGNOSIS — Z91041 Radiographic dye allergy status: Secondary | ICD-10-CM

## 2018-08-27 DIAGNOSIS — J440 Chronic obstructive pulmonary disease with acute lower respiratory infection: Secondary | ICD-10-CM | POA: Diagnosis present

## 2018-08-27 DIAGNOSIS — I214 Non-ST elevation (NSTEMI) myocardial infarction: Secondary | ICD-10-CM | POA: Diagnosis not present

## 2018-08-27 DIAGNOSIS — E785 Hyperlipidemia, unspecified: Secondary | ICD-10-CM | POA: Diagnosis present

## 2018-08-27 DIAGNOSIS — R778 Other specified abnormalities of plasma proteins: Secondary | ICD-10-CM

## 2018-08-27 DIAGNOSIS — R079 Chest pain, unspecified: Secondary | ICD-10-CM

## 2018-08-27 DIAGNOSIS — R7989 Other specified abnormal findings of blood chemistry: Secondary | ICD-10-CM

## 2018-08-27 DIAGNOSIS — Z79899 Other long term (current) drug therapy: Secondary | ICD-10-CM

## 2018-08-27 DIAGNOSIS — Z808 Family history of malignant neoplasm of other organs or systems: Secondary | ICD-10-CM

## 2018-08-27 DIAGNOSIS — Z6833 Body mass index (BMI) 33.0-33.9, adult: Secondary | ICD-10-CM

## 2018-08-27 DIAGNOSIS — N182 Chronic kidney disease, stage 2 (mild): Secondary | ICD-10-CM | POA: Diagnosis present

## 2018-08-27 DIAGNOSIS — J189 Pneumonia, unspecified organism: Secondary | ICD-10-CM

## 2018-08-27 DIAGNOSIS — Z720 Tobacco use: Secondary | ICD-10-CM | POA: Diagnosis present

## 2018-08-27 DIAGNOSIS — E669 Obesity, unspecified: Secondary | ICD-10-CM | POA: Diagnosis present

## 2018-08-27 DIAGNOSIS — F410 Panic disorder [episodic paroxysmal anxiety] without agoraphobia: Secondary | ICD-10-CM | POA: Diagnosis present

## 2018-08-27 DIAGNOSIS — I491 Atrial premature depolarization: Secondary | ICD-10-CM | POA: Diagnosis present

## 2018-08-27 DIAGNOSIS — K5900 Constipation, unspecified: Secondary | ICD-10-CM | POA: Diagnosis not present

## 2018-08-27 DIAGNOSIS — Z85828 Personal history of other malignant neoplasm of skin: Secondary | ICD-10-CM

## 2018-08-27 DIAGNOSIS — R109 Unspecified abdominal pain: Secondary | ICD-10-CM

## 2018-08-27 DIAGNOSIS — I10 Essential (primary) hypertension: Secondary | ICD-10-CM | POA: Diagnosis present

## 2018-08-27 DIAGNOSIS — J181 Lobar pneumonia, unspecified organism: Secondary | ICD-10-CM | POA: Diagnosis present

## 2018-08-27 DIAGNOSIS — R0789 Other chest pain: Secondary | ICD-10-CM | POA: Diagnosis not present

## 2018-08-27 DIAGNOSIS — I5032 Chronic diastolic (congestive) heart failure: Secondary | ICD-10-CM | POA: Diagnosis present

## 2018-08-27 DIAGNOSIS — Z8249 Family history of ischemic heart disease and other diseases of the circulatory system: Secondary | ICD-10-CM

## 2018-08-27 DIAGNOSIS — Z83511 Family history of glaucoma: Secondary | ICD-10-CM

## 2018-08-27 DIAGNOSIS — Z955 Presence of coronary angioplasty implant and graft: Secondary | ICD-10-CM

## 2018-08-27 DIAGNOSIS — I5033 Acute on chronic diastolic (congestive) heart failure: Secondary | ICD-10-CM | POA: Diagnosis present

## 2018-08-27 DIAGNOSIS — K5909 Other constipation: Secondary | ICD-10-CM | POA: Diagnosis present

## 2018-08-27 DIAGNOSIS — J9621 Acute and chronic respiratory failure with hypoxia: Secondary | ICD-10-CM | POA: Diagnosis present

## 2018-08-27 DIAGNOSIS — N179 Acute kidney failure, unspecified: Secondary | ICD-10-CM | POA: Diagnosis not present

## 2018-08-27 DIAGNOSIS — K219 Gastro-esophageal reflux disease without esophagitis: Secondary | ICD-10-CM | POA: Diagnosis present

## 2018-08-27 DIAGNOSIS — Z888 Allergy status to other drugs, medicaments and biological substances status: Secondary | ICD-10-CM

## 2018-08-27 DIAGNOSIS — M545 Low back pain: Secondary | ICD-10-CM | POA: Diagnosis present

## 2018-08-27 DIAGNOSIS — Z7982 Long term (current) use of aspirin: Secondary | ICD-10-CM

## 2018-08-27 DIAGNOSIS — Z7951 Long term (current) use of inhaled steroids: Secondary | ICD-10-CM

## 2018-08-27 DIAGNOSIS — J449 Chronic obstructive pulmonary disease, unspecified: Secondary | ICD-10-CM | POA: Diagnosis present

## 2018-08-27 DIAGNOSIS — Z87891 Personal history of nicotine dependence: Secondary | ICD-10-CM

## 2018-08-27 DIAGNOSIS — G8929 Other chronic pain: Secondary | ICD-10-CM | POA: Diagnosis present

## 2018-08-27 DIAGNOSIS — Z981 Arthrodesis status: Secondary | ICD-10-CM

## 2018-08-27 DIAGNOSIS — Z9981 Dependence on supplemental oxygen: Secondary | ICD-10-CM

## 2018-08-27 DIAGNOSIS — I4519 Other right bundle-branch block: Secondary | ICD-10-CM | POA: Diagnosis not present

## 2018-08-27 DIAGNOSIS — H9190 Unspecified hearing loss, unspecified ear: Secondary | ICD-10-CM | POA: Diagnosis present

## 2018-08-27 DIAGNOSIS — Z90710 Acquired absence of both cervix and uterus: Secondary | ICD-10-CM

## 2018-08-27 DIAGNOSIS — Z923 Personal history of irradiation: Secondary | ICD-10-CM

## 2018-08-27 DIAGNOSIS — J441 Chronic obstructive pulmonary disease with (acute) exacerbation: Secondary | ICD-10-CM | POA: Diagnosis present

## 2018-08-27 DIAGNOSIS — I130 Hypertensive heart and chronic kidney disease with heart failure and stage 1 through stage 4 chronic kidney disease, or unspecified chronic kidney disease: Secondary | ICD-10-CM | POA: Diagnosis present

## 2018-08-27 DIAGNOSIS — E876 Hypokalemia: Secondary | ICD-10-CM | POA: Diagnosis present

## 2018-08-27 HISTORY — DX: Non-ST elevation (NSTEMI) myocardial infarction: I21.4

## 2018-08-27 LAB — BASIC METABOLIC PANEL
Anion gap: 14 (ref 5–15)
BUN: 15 mg/dL (ref 8–23)
CO2: 27 mmol/L (ref 22–32)
Calcium: 9.8 mg/dL (ref 8.9–10.3)
Chloride: 99 mmol/L (ref 98–111)
Creatinine, Ser: 1.12 mg/dL — ABNORMAL HIGH (ref 0.44–1.00)
GFR calc Af Amer: 52 mL/min — ABNORMAL LOW (ref >60)
GFR calc non Af Amer: 45 mL/min — ABNORMAL LOW (ref >60)
Glucose, Bld: 106 mg/dL — ABNORMAL HIGH (ref 70–99)
Potassium: 2.8 mmol/L — ABNORMAL LOW (ref 3.5–5.1)
Sodium: 140 mmol/L (ref 135–145)

## 2018-08-27 LAB — CBC
HCT: 47.5 % — ABNORMAL HIGH (ref 36.0–46.0)
Hemoglobin: 15 g/dL (ref 12.0–15.0)
MCH: 29.3 pg (ref 26.0–34.0)
MCHC: 31.6 g/dL (ref 30.0–36.0)
MCV: 92.8 fL (ref 80.0–100.0)
Platelets: 205 10*3/uL (ref 150–400)
RBC: 5.12 MIL/uL — ABNORMAL HIGH (ref 3.87–5.11)
RDW: 11.7 % (ref 11.5–15.5)
WBC: 7.3 10*3/uL (ref 4.0–10.5)
nRBC: 0 % (ref 0.0–0.2)

## 2018-08-27 LAB — URINALYSIS, ROUTINE W REFLEX MICROSCOPIC
Bilirubin Urine: NEGATIVE
Glucose, UA: NEGATIVE mg/dL
Ketones, ur: NEGATIVE mg/dL
Nitrite: NEGATIVE
Protein, ur: 100 mg/dL — AB
Specific Gravity, Urine: 1.02 (ref 1.005–1.030)
pH: 5 (ref 5.0–8.0)

## 2018-08-27 LAB — HEPATIC FUNCTION PANEL
ALT: 13 U/L (ref 0–44)
AST: 21 U/L (ref 15–41)
Albumin: 3.7 g/dL (ref 3.5–5.0)
Alkaline Phosphatase: 49 U/L (ref 38–126)
Bilirubin, Direct: 0.1 mg/dL (ref 0.0–0.2)
Indirect Bilirubin: 0.3 mg/dL (ref 0.3–0.9)
Total Bilirubin: 0.4 mg/dL (ref 0.3–1.2)
Total Protein: 6.6 g/dL (ref 6.5–8.1)

## 2018-08-27 LAB — TROPONIN I: TROPONIN I: 0.21 ng/mL — AB (ref <0.03)

## 2018-08-27 LAB — LIPASE, BLOOD: Lipase: 39 U/L (ref 11–51)

## 2018-08-27 LAB — I-STAT TROPONIN, ED: Troponin i, poc: 0.09 ng/mL (ref 0.00–0.08)

## 2018-08-27 MED ORDER — NITROGLYCERIN 0.4 MG SL SUBL: 0.4000 mg | SUBLINGUAL_TABLET | SUBLINGUAL | Status: DC | PRN

## 2018-08-27 MED ORDER — MORPHINE SULFATE (PF) 4 MG/ML IV SOLN
4.0000 mg | Freq: Once | INTRAVENOUS | Status: AC
  Administered 2018-08-27: 4 mg via INTRAVENOUS
  Filled 2018-08-27: qty 1

## 2018-08-27 MED ORDER — CARVEDILOL 3.125 MG PO TABS
3.1250 mg | ORAL_TABLET | Freq: Two times a day (BID) | ORAL | Status: DC
  Administered 2018-08-27 – 2018-08-28 (×2): 3.125 mg via ORAL
  Filled 2018-08-27 (×2): qty 1

## 2018-08-27 MED ORDER — SODIUM CHLORIDE 0.9 % IV SOLN: 1.0000 g | Freq: Once | INTRAVENOUS | Status: DC

## 2018-08-27 MED ORDER — LORAZEPAM 2 MG/ML IJ SOLN
0.5000 mg | Freq: Once | INTRAMUSCULAR | Status: AC
  Administered 2018-08-27: 0.5 mg via INTRAVENOUS
  Filled 2018-08-27: qty 1

## 2018-08-27 MED ORDER — POTASSIUM CHLORIDE CRYS ER 20 MEQ PO TBCR
60.0000 meq | EXTENDED_RELEASE_TABLET | Freq: Once | ORAL | Status: AC
  Administered 2018-08-27: 60 meq via ORAL
  Filled 2018-08-27: qty 3

## 2018-08-27 MED ORDER — LUBIPROSTONE 24 MCG PO CAPS
24.0000 ug | ORAL_CAPSULE | Freq: Two times a day (BID) | ORAL | Status: DC
  Administered 2018-08-28 – 2018-09-01 (×9): 24 ug via ORAL
  Filled 2018-08-27 (×9): qty 1

## 2018-08-27 MED ORDER — ISOSORBIDE MONONITRATE ER 60 MG PO TB24
120.0000 mg | ORAL_TABLET | Freq: Every day | ORAL | Status: DC
  Administered 2018-08-28 – 2018-09-01 (×5): 120 mg via ORAL
  Filled 2018-08-27 (×4): qty 2
  Filled 2018-08-27: qty 4

## 2018-08-27 MED ORDER — BUDESONIDE 0.25 MG/2ML IN SUSP: 0.2500 mg | Freq: Two times a day (BID) | RESPIRATORY_TRACT | Status: DC

## 2018-08-27 MED ORDER — ARFORMOTEROL TARTRATE 15 MCG/2ML IN NEBU
15.0000 ug | INHALATION_SOLUTION | Freq: Two times a day (BID) | RESPIRATORY_TRACT | Status: DC
  Administered 2018-08-27 – 2018-09-01 (×9): 15 ug via RESPIRATORY_TRACT
  Filled 2018-08-27 (×10): qty 2

## 2018-08-27 MED ORDER — POLYETHYLENE GLYCOL 3350 17 G PO PACK
17.0000 g | PACK | Freq: Every day | ORAL | Status: DC
  Administered 2018-08-28 – 2018-08-30 (×2): 17 g via ORAL
  Filled 2018-08-27 (×2): qty 1

## 2018-08-27 MED ORDER — ASPIRIN 81 MG PO CHEW
324.0000 mg | CHEWABLE_TABLET | Freq: Once | ORAL | Status: AC
  Administered 2018-08-27: 324 mg via ORAL
  Filled 2018-08-27: qty 4

## 2018-08-27 MED ORDER — ONDANSETRON HCL 4 MG/2ML IJ SOLN
4.0000 mg | Freq: Once | INTRAMUSCULAR | Status: AC
  Administered 2018-08-27: 4 mg via INTRAVENOUS
  Filled 2018-08-27: qty 2

## 2018-08-27 MED ORDER — ASPIRIN 81 MG PO CHEW
81.0000 mg | CHEWABLE_TABLET | Freq: Every day | ORAL | Status: DC
  Administered 2018-08-28 – 2018-09-01 (×5): 81 mg via ORAL
  Filled 2018-08-27 (×5): qty 1

## 2018-08-27 MED ORDER — BUDESONIDE 0.25 MG/2ML IN SUSP
0.2500 mg | Freq: Two times a day (BID) | RESPIRATORY_TRACT | Status: DC
  Administered 2018-08-27 – 2018-09-01 (×10): 0.25 mg via RESPIRATORY_TRACT
  Filled 2018-08-27 (×11): qty 2

## 2018-08-27 MED ORDER — AZITHROMYCIN 250 MG PO TABS: 500.0000 mg | ORAL_TABLET | Freq: Once | ORAL | Status: DC

## 2018-08-27 MED ORDER — BUDESONIDE 0.25 MG/2ML IN SUSP: 0.2500 mg | Freq: Four times a day (QID) | RESPIRATORY_TRACT | Status: DC

## 2018-08-27 MED ORDER — IPRATROPIUM-ALBUTEROL 0.5-2.5 (3) MG/3ML IN SOLN
3.0000 mL | RESPIRATORY_TRACT | Status: DC | PRN
  Administered 2018-08-28: 3 mL via RESPIRATORY_TRACT
  Filled 2018-08-27: qty 3

## 2018-08-27 MED ORDER — KETOROLAC TROMETHAMINE 15 MG/ML IJ SOLN
15.0000 mg | Freq: Four times a day (QID) | INTRAMUSCULAR | Status: DC | PRN
  Administered 2018-08-28: 15 mg via INTRAVENOUS
  Filled 2018-08-27: qty 1

## 2018-08-27 MED ORDER — ENOXAPARIN SODIUM 40 MG/0.4ML ~~LOC~~ SOLN
40.0000 mg | SUBCUTANEOUS | Status: DC
  Administered 2018-08-27: 40 mg via SUBCUTANEOUS
  Filled 2018-08-27: qty 0.4

## 2018-08-27 MED ORDER — MAGNESIUM SULFATE 2 GM/50ML IV SOLN
2.0000 g | Freq: Once | INTRAVENOUS | Status: AC
Start: 2018-08-27 — End: 2018-08-27
  Administered 2018-08-27: 2 g via INTRAVENOUS
  Filled 2018-08-27: qty 50

## 2018-08-27 MED ORDER — DOCUSATE SODIUM 100 MG PO CAPS
100.0000 mg | ORAL_CAPSULE | Freq: Two times a day (BID) | ORAL | Status: DC
  Administered 2018-08-27 – 2018-08-28 (×2): 100 mg via ORAL
  Filled 2018-08-27 (×2): qty 1

## 2018-08-27 NOTE — Telephone Encounter (Signed)
Will route message to Dr. Vaughan Browner to make him aware.

## 2018-08-27 NOTE — ED Notes (Signed)
MD Kohut notified of troponin 0.09

## 2018-08-27 NOTE — ED Notes (Signed)
Patient transported to CT 

## 2018-08-27 NOTE — ED Triage Notes (Addendum)
Pt here from home, c/o generalized pain all over and CP.  Pt in Afib. A&O x4.

## 2018-08-27 NOTE — Telephone Encounter (Signed)
Estill Bamberg with [facility] states she is cancelling order on their side, as she cannot get the patient to call her back.  If patient does contact them she states she can restart the order.  States she has to speak to patient to be able to ship it. No call back is needed unless questions.

## 2018-08-27 NOTE — H&P (Signed)
History and Physical    [name] NGE:[id number] DOB: 1935-05-29 DOA: 08/27/2018  PCP: Leonard Downing, MD  Patient coming from: Home  Chief Complaint: Chest pain, abd pain  HPI: [name] is a 83 y.o. female with medical history significant of CAD s/p prior stent placement, COPD, HTN, HLD, GERD, constipation who presented to the ED with initial complaints of R sided "hip" pain. Of note, pt is poor historian and detailed history was difficult to obtain. Patient did complain of constantly increased abd girth with continued daily bowel movement, however only small amount of stool per episode. Pt reports chest pains located mid-sternally, worse with cough and with palpation.   ED Course: In the ED, patient underwent CT abd with no acute findings noted. Patient was given one dose of morphine which resolved chest pain and "flank" pain. Initial troponin noted to be 0.09. EKG unchanged from prior. CXR with possible opacity in RLL consistent with PNA vs atelectasis. Given cardiac risk factors and known cardiac history, hosptialist consulted for consideration for admission.  Review of Systems:  Review of Systems  Constitutional: Negative for chills, fever, malaise/fatigue and weight loss.  HENT: Negative for congestion, ear pain, nosebleeds, sinus pain and tinnitus.   Eyes: Negative for double vision, photophobia and pain.  Respiratory: Negative for sputum production, shortness of breath and wheezing.   Cardiovascular: Positive for chest pain. Negative for palpitations, orthopnea and claudication.  Gastrointestinal: Positive for abdominal pain and constipation. Negative for nausea and vomiting.  Genitourinary: Negative for frequency, hematuria and urgency.  Musculoskeletal: Negative for back pain and joint pain.  Neurological: Negative for tingling, tremors, seizures and loss of consciousness.  Psychiatric/Behavioral: Negative for substance abuse. The patient is not nervous/anxious  and does not have insomnia.     Past Medical History:  Diagnosis Date  . Arthritis    "in my spine" (04/15/2018)  . Blurred vision   . CAD (coronary artery disease)    Multiple percutaneous revascularization. Catheterization May 2013 left main normal, patent LAD stent, 90% circumflex stenosis, patent right coronary artery stents. Patient had a DES to the circumflex. The EF was well-preserved.  . Chronic lower back pain   . COPD (chronic obstructive pulmonary disease) ([place])   . Fatigue   . GERD (gastroesophageal reflux disease)   . Hard of hearing   . HTN (hypertension)   . Hx of radiation therapy    right nose 4500 cGy in 10 sessions over 5 weeks (2 treatments a week)  . Hyperlipemia   . On home oxygen therapy    "2L; 24/7" (04/15/2018)  . Pneumonia    "I've had a touch of it" (04/15/2018)  . Squamous cell carcinoma of nose    right    Past Surgical History:  Procedure Laterality Date  . ABDOMINAL HYSTERECTOMY    . BACK SURGERY    . CORONARY ANGIOPLASTY WITH STENT PLACEMENT  2002-2009  . CORONARY ANGIOPLASTY WITH STENT PLACEMENT  05/06/12   "1; this makes 5"  . DILATION AND CURETTAGE OF UTERUS    . ESOPHAGEAL DILATION  ~ 2012   Dr. Deatra Ina  . LUMBAR SPINE SURGERY  04/2011   Rods, screws and cages  . PERCUTANEOUS CORONARY STENT INTERVENTION (PCI-S) N/A 05/06/2012   Procedure: PERCUTANEOUS CORONARY STENT INTERVENTION (PCI-S);  Surgeon: Burnell Blanks, MD;  Location: [facility] C[facility] LAB;  Service: Cardiovascular;  Laterality: N/A;  . SKIN BIOPSY  10/15/13   right nose-microinvasive squamous cell carcinoma  . TUBAL LIGATION  reports that she quit smoking about 5 years ago. Her smoking use included cigarettes. She has a 27.50 pack-year smoking history. She has never used smokeless tobacco. She reports that she does not drink alcohol or use drugs.  Allergies  Allergen Reactions  . Iohexol Other (See Comments)    Code:  HIVES, Desc:  PER ROBIN @ GI, PT IS ALLERGIC TO IVP DYE  08/28/10 RM  . Lipitor [Atorvastatin Calcium] Hives    Family History  Problem Relation Age of Onset  . Glaucoma Father   . Heart attack Father   . Bone cancer Mother     Prior to Admission medications   Medication Sig Start Date End Date Taking? Authorizing Provider  acetaminophen (TYLENOL 8 HOUR) 650 MG CR tablet Take 1 tablet (650 mg total) by mouth every 8 (eight) hours as needed. 03/16/18   Varney Biles, MD  albuterol (PROVENTIL HFA;VENTOLIN HFA) 108 (90 BASE) MCG/ACT inhaler Inhale 2 puffs into the lungs every 4 (four) hours as needed for wheezing. 12/01/14   Delfina Redwood, MD  amitriptyline (ELAVIL) 10 MG tablet Take 10-20 mg by mouth at bedtime as needed for sleep (pain).    [provider]  arformoterol (BROVANA) 15 MCG/2ML NEBU Take 2 mLs (15 mcg total) by nebulization 2 (two) times daily. 11/28/17   Marshell Garfinkel, MD  aspirin 81 MG tablet Take 1 tablet (81 mg total) by mouth daily. 10/13/17   Cherene Altes, MD  budesonide (PULMICORT) 0.25 MG/2ML nebulizer solution Take 2 mLs (0.25 mg total) by nebulization 2 (two) times daily. Patient taking differently: Take 0.25 mg by nebulization 4 (four) times daily.  11/28/17 11/28/18  Marshell Garfinkel, MD  carvedilol (COREG) 3.125 MG tablet Take 1 tablet (3.125 mg total) by mouth 2 (two) times daily with a meal. 10/12/17   Cherene Altes, MD  cholecalciferol (VITAMIN D) 1000 UNITS tablet Take 1,000 Units by mouth daily.     [provider]  dicyclomine (BENTYL) 20 MG tablet Take 1 tablet (20 mg total) by mouth 3 (three) times daily as needed for spasms. Cramping abdominal pain. 08/16/17   Charlesetta Shanks, MD  docusate sodium (COLACE) 100 MG capsule Take 1 capsule (100 mg total) by mouth every 12 (twelve) hours. 08/16/17   Charlesetta Shanks, MD  fluticasone (FLONASE) 50 MCG/ACT nasal spray Place 2 sprays into both nostrils daily as needed for allergies or rhinitis. 10/12/17   Cherene Altes, MD  gabapentin  (NEURONTIN) 100 MG capsule Take 300 mg by mouth every 8 (eight) hours as needed (pain).  04/02/18   [provider]  guaiFENesin (MUCINEX) 600 MG 12 hr tablet Take 1 tablet (600 mg total) by mouth 2 (two) times daily. Patient taking differently: Take 600 mg by mouth 2 (two) times daily as needed for to loosen phlegm.  02/03/13   Kathie Dike, MD  ibuprofen (ADVIL,MOTRIN) 400 MG tablet Take 1 tablet (400 mg total) by mouth every 6 (six) hours as needed. 03/16/18   Varney Biles, MD  isosorbide mononitrate (IMDUR) 120 MG 24 hr tablet Take 1 tablet (120 mg total) by mouth daily. 03/27/18   Minus Breeding, MD  lubiprostone (AMITIZA) 24 MCG capsule Take 1 capsule (24 mcg total) by mouth 2 (two) times daily with a meal. 12/23/17   Nandigam, Venia Minks, MD  nitroGLYCERIN (NITROSTAT) 0.4 MG SL tablet Place 0.4 mg under the tongue every 5 (five) minutes as needed for chest pain.    [provider]  polyethylene glycol (  MIRALAX) packet Take 17 g by mouth daily. 03/16/18   Varney Biles, MD  psyllium (METAMUCIL SMOOTH TEXTURE) 28 % packet Take 1 packet by mouth 2 (two) times daily as needed (constipation).     [provider]  ranitidine (ZANTAC) 300 MG tablet Take 0.5 tablets (150 mg total) by mouth 2 (two) times daily. Patient taking differently: Take 300 mg by mouth at bedtime.  02/21/18   Ledell Noss, MD  Revefenacin (YUPELRI) 175 MCG/3ML SOLN Inhale 3 mLs into the lungs daily. J44.9 08/20/18   Mannam, Hart Robinsons, MD  trolamine salicylate (ASPERCREME) 10 % cream Apply 1 application topically daily as needed for muscle pain.    [provider]  calcium carbonate (OS-CAL) 600 MG TABS Take 600 mg by mouth daily.    02/04/12  [provider]  pravastatin (PRAVACHOL) 40 MG tablet Take 40 mg by mouth daily.    02/04/12  [provider]    Physical Exam: Vitals:   08/27/18 1400 08/27/18 1415 08/27/18 1430 08/27/18 1615  BP: 138/69 131/62 (!) 150/94 (!) 146/82    Pulse: 78 84 88 90  Resp:  19 19 16   Temp:      TempSrc:      SpO2: 95% 96% 98%   Weight:      Height:        Constitutional: NAD, calm, comfortable Vitals:   08/27/18 1400 08/27/18 1415 08/27/18 1430 08/27/18 1615  BP: 138/69 131/62 (!) 150/94 (!) 146/82  Pulse: 78 84 88 90  Resp:  19 19 16   Temp:      TempSrc:      SpO2: 95% 96% 98%   Weight:      Height:       Eyes: PERRL, lids and conjunctivae normal ENMT: Mucous membranes are moist. Posterior pharynx clear of any exudate or lesions.Normal dentition.  Neck: normal, supple, no masses, no thyromegaly Respiratory: clear to auscultation bilaterally, end-expiratory wheezing Cardiovascular: Regular rate and rhythm, s1,s2, chest pain reproducible on palpation Abdomen: decreased BS, distended, generally tender, worse in RLQ Musculoskeletal: no clubbing / cyanosis. No joint deformity upper and lower extremities. Good ROM, no contractures. Normal muscle tone.  Skin: no rashes, lesions, ulcers. No induration Neurologic: CN 2-12 grossly intact. Sensation intact, no tremors Psychiatric: Normal judgment and insight. Alert and oriented x 3. Normal mood.    Labs on Admission: I have personally reviewed following labs and imaging studies  CBC: Recent Labs  Lab 08/27/18 1224  WBC 7.3  HGB 15.0  HCT 47.5*  MCV 92.8  PLT 093   Basic Metabolic Panel: Recent Labs  Lab 08/27/18 1224  NA 140  K 2.8*  CL 99  CO2 27  GLUCOSE 106*  BUN 15  CREATININE 1.12*  CALCIUM 9.8   GFR: Estimated Creatinine Clearance: 43 mL/min (A) (by C-G formula based on SCr of 1.12 mg/dL (H)). Liver Function Tests: Recent Labs  Lab 08/27/18 1224  AST 21  ALT 13  ALKPHOS 49  BILITOT 0.4  PROT 6.6  ALBUMIN 3.7   Recent Labs  Lab 08/27/18 1224  LIPASE 39   No results for input(s): AMMONIA in the last 168 hours. Coagulation Profile: No results for input(s): INR, PROTIME in the last 168 hours. Cardiac Enzymes: No results for input(s):  CKTOTAL, CKMB, CKMBINDEX, TROPONINI in the last 168 hours. BNP (last 3 results) No results for input(s): PROBNP in the last 8760 hours. HbA1C: No results for input(s): HGBA1C in the last 72 hours. CBG: No results  for input(s): GLUCAP in the last 168 hours. Lipid Profile: No results for input(s): CHOL, HDL, LDLCALC, TRIG, CHOLHDL, LDLDIRECT in the last 72 hours. Thyroid Function Tests: No results for input(s): TSH, T4TOTAL, FREET4, T3FREE, THYROIDAB in the last 72 hours. Anemia Panel: No results for input(s): VITAMINB12, FOLATE, FERRITIN, TIBC, IRON, RETICCTPCT in the last 72 hours. Urine analysis:    Component Value Date/Time   COLORURINE YELLOW 04/14/2018 1750   APPEARANCEUR CLEAR 04/14/2018 1750   LABSPEC 1.011 04/14/2018 1750   PHURINE 6.0 04/14/2018 1750   GLUCOSEU NEGATIVE 04/14/2018 1750   HGBUR NEGATIVE 04/14/2018 1750   BILIRUBINUR NEGATIVE 04/14/2018 1750   KETONESUR NEGATIVE 04/14/2018 1750   PROTEINUR NEGATIVE 04/14/2018 1750   UROBILINOGEN 0.2 11/28/2014 1557   NITRITE NEGATIVE 04/14/2018 1750   LEUKOCYTESUR NEGATIVE 04/14/2018 1750   Sepsis Labs: !!!!!!!!!!!!!!!!!!!!!!!!!!!!!!!!!!!!!!!!!!!! @LABRCNTIP (procalcitonin:4,lacticidven:4) )No results found for this or any previous visit (from the past 240 hour(s)).   Radiological Exams on Admission: Ct Abdomen Pelvis Wo Contrast  Result Date: 08/27/2018 CLINICAL DATA:  Generalized abdominal and flank pain. EXAM: CT ABDOMEN AND PELVIS WITHOUT CONTRAST TECHNIQUE: Multidetector CT imaging of the abdomen and pelvis was performed following the standard protocol without IV contrast. COMPARISON:  04/14/2018 FINDINGS: Lower chest: Cardiomegaly with aortic atherosclerosis and dense left main and three-vessel coronary arteriosclerosis. No pericardial effusion or thickening. Dependent and subsegmental atelectasis at lung bases. Hepatobiliary: Biliary sludge and calculi noted within the gallbladder without wall thickening or  pericholecystic fluid. Calcified granuloma in the right hepatic lobe adjacent to gallbladder. No biliary dilatation. Pancreas: Normal Spleen: Normal Adrenals/Urinary Tract: Hypodense left adrenal nodule measuring 13 x 15 mm compatible with a benign relatively stable since prior. Punctate nonobstructing bilateral calcifications noted within both kidneys some which may be vascular. Simple appearing cysts arising off the lower poles both kidneys, on left measuring 2.1 x 2.2 x 1.8 cm and on right, 2.8 x 2 x 2.7 cm. Smaller parapelvic left-sided renal cysts are also suspected as well as cortically based cysts. No hydroureteronephrosis. The urinary bladder is unremarkable for the degree distention. Stomach/Bowel: Physiologic distention of the stomach. Normal small bowel rotation without obstruction. Redemonstration of extensive colonic diverticulosis without acute diverticulitis. The appendix is not confidently identified but no pericecal inflammatory change is noted. Vascular/Lymphatic: Moderate marked aortoiliac atherosclerosis without aneurysm. No lymphadenopathy by CT size criteria. Reproductive: Hysterectomy.  No adnexal mass. Other: No free air nor free fluid. Small right lateral hernia containing fat. Musculoskeletal: Stigmata posterior lumbar interbody fusion L1 through L5. Spondylosis included thoracolumbar spine otherwise noted without acute osseous appearing abnormality. IMPRESSION: 1. Cardiomegaly with left main and three-vessel coronary arteriosclerosis. Thoracic and abdominal aortic atherosclerosis without visualized aneurysm. 2. Uncomplicated cholelithiasis.  Right hepatic granuloma. 3. Extensive colonic diverticulosis without evidence of acute diverticulitis. No bowel inflammation or obstruction. 4. Intrarenal calculi without obstruction noted bilaterally. Bilateral renal cysts. Stable left adrenal nodule compatible with a benign adenoma. 5. Right posterolateral fat containing hernia as before.  Electronically Signed   By: Ashley Royalty M.D.   On: 08/27/2018 15:21   Dg Chest 2 View  Result Date: 08/27/2018 CLINICAL DATA:  Chest pain and cough today. EXAM: CHEST - 2 VIEW COMPARISON:  08/20/2018 FINDINGS: There is opacity in the right lower lobe consistent with pneumonia or atelectasis. Lungs are otherwise clear. No pleural effusion or pneumothorax. Cardiac silhouette is mildly enlarged. Coronary artery stents are stable. No mediastinal or hilar masses. No evidence of adenopathy. Skeletal structures are intact. IMPRESSION: 1. Opacity in the right lower  lobe consistent with pneumonia or atelectasis. No other evidence of acute cardiopulmonary disease. 2. Stable cardiomegaly Electronically Signed   By: Lajean Manes M.D.   On: 08/27/2018 13:33    EKG: Independently reviewed. Sinus with no ST changes  Assessment/Plan Principal Problem:   Chest pain Active Problems:   Hyperlipemia   HTN (hypertension)   Abdominal pain   Constipation   Tobacco abuse   COPD, group D, by GOLD 2017 classification (HCC)   Right flank pain   1. Chest pain 1. Patient with known CAD s/p prior stent placement 2. No active chest pains at present 3. Initial trop of 0.09. Will follow serial trop. Formal cardiology consult if trop trends up 4. Will check 2d echo to r/o WMA 5. Cont on toradol and NTG as needed 2. Abd pain 1. CT abd personally reviewed. Stool noted throughout transverse colon and in ascending colon, increased colonic gas noted per my read 2. Patient known to [facility] for constipation, followed as outpatient 3. Will give trial of cathartics. Patient reports historically being admitted in the past for complications from constipation, requiring multiple enemas 3. HTN 1. BP stable at present 2. Cont home meds as tolerated 4. Constipation 1. Per above 5. COPD 1. On minimal O2 support 2. Cont bronchodilators as needed 6. HLD 1. Stable at present 2. Cont home meds as tolerated  DVT  prophylaxis: Lovenox subQ  Code Status: Full Family Communication: Pt in room  Disposition Plan: Possible d/c home in 24hrs  Consults called:  Admission status: Observation status as would likely require less than 2 midnight stay to evaluate chest pain   Marylu Lund MD [facility] Pager On Amion  If 7PM-7AM, please contact night-coverage  08/27/2018, 5:10 PM

## 2018-08-27 NOTE — ED Notes (Addendum)
Patient transported to XRAY 

## 2018-08-27 NOTE — ED Provider Notes (Addendum)
[place] EMERGENCY DEPARTMENT Provider Note   CSN: [id number] Arrival date & time: 08/27/18  1209     History   Chief Complaint No chief complaint on file.   HPI [name] is a 83 y.o. female.  HPI   83 year old female with multiple pain complaints.  She states intermittent pain in her right hip but cannot quantify for how long exactly.  States worse within the past day or so though.  She denies any trauma.  She is very tangential and it's hard for me to get a focused history. She also reports a headache since coming to the emergency room. She also complains of chest pain. Says it just hurts. She has baseline shortness of breath which she attributes to COPD. Denies acute change.   Past Medical History:  Diagnosis Date  . Arthritis    "in my spine" (04/15/2018)  . Blurred vision   . CAD (coronary artery disease)    Multiple percutaneous revascularization. Catheterization May 2013 left main normal, patent LAD stent, 90% circumflex stenosis, patent right coronary artery stents. Patient had a DES to the circumflex. The EF was well-preserved.  . Chronic lower back pain   . COPD (chronic obstructive pulmonary disease) ([place])   . Fatigue   . GERD (gastroesophageal reflux disease)   . Hard of hearing   . HTN (hypertension)   . Hx of radiation therapy    right nose 4500 cGy in 10 sessions over 5 weeks (2 treatments a week)  . Hyperlipemia   . On home oxygen therapy    "2L; 24/7" (04/15/2018)  . Pneumonia    "I've had a touch of it" (04/15/2018)  . Squamous cell carcinoma of nose    right    Patient Active Problem List   Diagnosis Date Noted  . GERD (gastroesophageal reflux disease) 04/14/2018  . Back pain 04/14/2018  . Right flank pain 04/14/2018  . COPD, group D, by GOLD 2017 classification ([place]) 10/06/2017  . Depression 10/05/2017  . Rectal pain 10/05/2017  . AP (abdominal pain)   . Atypical chest pain 11/28/2014  . Tobacco abuse 11/28/2014  .  Skin cancer of nose   . Squamous cell carcinoma of skin of other and unspecified parts of face 10/15/2013  . COPD exacerbation ([place]) 01/30/2013  . Abdominal pain 01/30/2013  . Constipation 01/30/2013  . Hypokalemia 01/30/2013  . Leukocytosis 05/14/2012  . HTN (hypertension) 05/14/2012  . Progressive angina ([place]) 05/07/2012  . Hyperlipemia   . Dysphagia, unspecified(787.20) 02/04/2012  . Esophageal reflux 02/04/2012  . Coronary atherosclerosis 08/28/2010  . ANAL OR RECTAL PAIN 08/28/2010  . NAUSEA ALONE 08/28/2010    Past Surgical History:  Procedure Laterality Date  . ABDOMINAL HYSTERECTOMY    . BACK SURGERY    . CORONARY ANGIOPLASTY WITH STENT PLACEMENT  2002-2009  . CORONARY ANGIOPLASTY WITH STENT PLACEMENT  05/06/12   "1; this makes 5"  . DILATION AND CURETTAGE OF UTERUS    . ESOPHAGEAL DILATION  ~ 2012   Dr. Deatra Ina  . LUMBAR SPINE SURGERY  04/2011   Rods, screws and cages  . PERCUTANEOUS CORONARY STENT INTERVENTION (PCI-S) N/A 05/06/2012   Procedure: PERCUTANEOUS CORONARY STENT INTERVENTION (PCI-S);  Surgeon: Burnell Blanks, MD;  Location: [facility] CATH LAB;  Service: Cardiovascular;  Laterality: N/A;  . SKIN BIOPSY  10/15/13   right nose-microinvasive squamous cell carcinoma  . TUBAL LIGATION       OB History   None  Home Medications    Prior to Admission medications   Medication Sig Start Date End Date Taking? Authorizing Provider  acetaminophen (TYLENOL 8 HOUR) 650 MG CR tablet Take 1 tablet (650 mg total) by mouth every 8 (eight) hours as needed. 03/16/18   Varney Biles, MD  albuterol (PROVENTIL HFA;VENTOLIN HFA) 108 (90 BASE) MCG/ACT inhaler Inhale 2 puffs into the lungs every 4 (four) hours as needed for wheezing. 12/01/14   Delfina Redwood, MD  amitriptyline (ELAVIL) 10 MG tablet Take 10-20 mg by mouth at bedtime as needed for sleep (pain).    [provider]  arformoterol (BROVANA) 15 MCG/2ML NEBU Take 2 mLs (15 mcg total) by nebulization  2 (two) times daily. 11/28/17   Marshell Garfinkel, MD  aspirin 81 MG tablet Take 1 tablet (81 mg total) by mouth daily. 10/13/17   Cherene Altes, MD  budesonide (PULMICORT) 0.25 MG/2ML nebulizer solution Take 2 mLs (0.25 mg total) by nebulization 2 (two) times daily. Patient taking differently: Take 0.25 mg by nebulization 4 (four) times daily.  11/28/17 11/28/18  Marshell Garfinkel, MD  carvedilol (COREG) 3.125 MG tablet Take 1 tablet (3.125 mg total) by mouth 2 (two) times daily with a meal. 10/12/17   Cherene Altes, MD  cholecalciferol (VITAMIN D) 1000 UNITS tablet Take 1,000 Units by mouth daily.     [provider]  dicyclomine (BENTYL) 20 MG tablet Take 1 tablet (20 mg total) by mouth 3 (three) times daily as needed for spasms. Cramping abdominal pain. 08/16/17   Charlesetta Shanks, MD  docusate sodium (COLACE) 100 MG capsule Take 1 capsule (100 mg total) by mouth every 12 (twelve) hours. 08/16/17   Charlesetta Shanks, MD  fluticasone (FLONASE) 50 MCG/ACT nasal spray Place 2 sprays into both nostrils daily as needed for allergies or rhinitis. 10/12/17   Cherene Altes, MD  gabapentin (NEURONTIN) 100 MG capsule Take 300 mg by mouth every 8 (eight) hours as needed (pain).  04/02/18   [provider]  guaiFENesin (MUCINEX) 600 MG 12 hr tablet Take 1 tablet (600 mg total) by mouth 2 (two) times daily. Patient taking differently: Take 600 mg by mouth 2 (two) times daily as needed for to loosen phlegm.  02/03/13   Kathie Dike, MD  ibuprofen (ADVIL,MOTRIN) 400 MG tablet Take 1 tablet (400 mg total) by mouth every 6 (six) hours as needed. 03/16/18   Varney Biles, MD  isosorbide mononitrate (IMDUR) 120 MG 24 hr tablet Take 1 tablet (120 mg total) by mouth daily. 03/27/18   Minus Breeding, MD  lubiprostone (AMITIZA) 24 MCG capsule Take 1 capsule (24 mcg total) by mouth 2 (two) times daily with a meal. 12/23/17   Nandigam, Venia Minks, MD  nitroGLYCERIN (NITROSTAT) 0.4 MG SL tablet Place  0.4 mg under the tongue every 5 (five) minutes as needed for chest pain.    [provider]  polyethylene glycol (MIRALAX) packet Take 17 g by mouth daily. 03/16/18   Varney Biles, MD  psyllium (METAMUCIL SMOOTH TEXTURE) 28 % packet Take 1 packet by mouth 2 (two) times daily as needed (constipation).     [provider]  ranitidine (ZANTAC) 300 MG tablet Take 0.5 tablets (150 mg total) by mouth 2 (two) times daily. Patient taking differently: Take 300 mg by mouth at bedtime.  02/21/18   Ledell Noss, MD  Revefenacin (YUPELRI) 175 MCG/3ML SOLN Inhale 3 mLs into the lungs daily. J44.9 08/20/18   Mannam, Hart Robinsons, MD  trolamine salicylate (ASPERCREME) 10 %  cream Apply 1 application topically daily as needed for muscle pain.    [provider]  calcium carbonate (OS-CAL) 600 MG TABS Take 600 mg by mouth daily.    02/04/12  [provider]  pravastatin (PRAVACHOL) 40 MG tablet Take 40 mg by mouth daily.    02/04/12  [provider]    Family History Family History  Problem Relation Age of Onset  . Glaucoma Father   . Heart attack Father   . Bone cancer Mother     Social History Social History   Tobacco Use  . Smoking status: Former Smoker    Packs/day: 0.50    Years: 55.00    Pack years: 27.50    Types: Cigarettes    Last attempt to quit: 11/28/2012    Years since quitting: 5.7  . Smokeless tobacco: Never Used  Substance Use Topics  . Alcohol use: Never    Alcohol/week: 0.0 standard drinks    Frequency: Never    Comment: "drank one time; never again"  . Drug use: Never     Allergies   Iohexol and Lipitor [atorvastatin calcium]   Review of Systems Review of Systems All systems reviewed and negative, other than as noted in HPI.   Physical Exam Updated Vital Signs BP (!) 146/82   Pulse 90   Temp 98.6 F (37 C) (Oral)   Resp 16   Ht 5\' 4"  (1.626 m)   Wt 91 kg   SpO2 98%   BMI 34.44 kg/m   Physical Exam  Constitutional: She  appears well-developed and well-nourished. No distress.  HENT:  Head: Normocephalic and atraumatic.  Eyes: Conjunctivae are normal. Right eye exhibits no discharge. Left eye exhibits no discharge.  Neck: Neck supple.  Cardiovascular: Normal rate, regular rhythm and normal heart sounds. Exam reveals no gallop and no friction rub.  No murmur heard. Pulmonary/Chest: Effort normal and breath sounds normal. No respiratory distress.  Abdominal: Soft. She exhibits no distension. There is tenderness.  Right abdominal tenderness without rebound or guarding.  Obese abdomen.  Does not really seem distended.  Musculoskeletal: She exhibits no edema or tenderness.  Neurological: She is alert.  Skin: Skin is warm and dry.  Psychiatric: She has a normal mood and affect. Her behavior is normal. Thought content normal.  Nursing note and vitals reviewed.    ED Treatments / Results  Labs (all labs ordered are listed, but only abnormal results are displayed) Labs Reviewed  BASIC METABOLIC PANEL - Abnormal; Notable for the following components:      Result Value   Potassium 2.8 (*)    Glucose, Bld 106 (*)    Creatinine, Ser 1.12 (*)    GFR calc non Af Amer 45 (*)    GFR calc Af Amer 52 (*)    All other components within normal limits  CBC - Abnormal; Notable for the following components:   RBC 5.12 (*)    HCT 47.5 (*)    All other components within normal limits  I-STAT TROPONIN, ED - Abnormal; Notable for the following components:   Troponin i, poc 0.09 (*)    All other components within normal limits  HEPATIC FUNCTION PANEL  LIPASE, BLOOD  URINALYSIS, ROUTINE W REFLEX MICROSCOPIC    EKG EKG Interpretation  Date/Time:  Thursday August 27 2018 12:22:55 EDT Ventricular Rate:  92 PR Interval:    QRS Duration: 108 QT Interval:  382 QTC Calculation: 473 R Axis:   89 Text Interpretation:  Sinus  rhythm Borderline right axis deviation RSR' in V1 or V2, probably normal variant Confirmed by  Virgel Manifold [phone]) on 08/27/2018 2:14:48 PM   Radiology Ct Abdomen Pelvis Wo Contrast  Result Date: 08/27/2018 CLINICAL DATA:  Generalized abdominal and flank pain. EXAM: CT ABDOMEN AND PELVIS WITHOUT CONTRAST TECHNIQUE: Multidetector CT imaging of the abdomen and pelvis was performed following the standard protocol without IV contrast. COMPARISON:  04/14/2018 FINDINGS: Lower chest: Cardiomegaly with aortic atherosclerosis and dense left main and three-vessel coronary arteriosclerosis. No pericardial effusion or thickening. Dependent and subsegmental atelectasis at lung bases. Hepatobiliary: Biliary sludge and calculi noted within the gallbladder without wall thickening or pericholecystic fluid. Calcified granuloma in the right hepatic lobe adjacent to gallbladder. No biliary dilatation. Pancreas: Normal Spleen: Normal Adrenals/Urinary Tract: Hypodense left adrenal nodule measuring 13 x 15 mm compatible with a benign relatively stable since prior. Punctate nonobstructing bilateral calcifications noted within both kidneys some which may be vascular. Simple appearing cysts arising off the lower poles both kidneys, on left measuring 2.1 x 2.2 x 1.8 cm and on right, 2.8 x 2 x 2.7 cm. Smaller parapelvic left-sided renal cysts are also suspected as well as cortically based cysts. No hydroureteronephrosis. The urinary bladder is unremarkable for the degree distention. Stomach/Bowel: Physiologic distention of the stomach. Normal small bowel rotation without obstruction. Redemonstration of extensive colonic diverticulosis without acute diverticulitis. The appendix is not confidently identified but no pericecal inflammatory change is noted. Vascular/Lymphatic: Moderate marked aortoiliac atherosclerosis without aneurysm. No lymphadenopathy by CT size criteria. Reproductive: Hysterectomy.  No adnexal mass. Other: No free air nor free fluid. Small right lateral hernia containing fat. Musculoskeletal: Stigmata  posterior lumbar interbody fusion L1 through L5. Spondylosis included thoracolumbar spine otherwise noted without acute osseous appearing abnormality. IMPRESSION: 1. Cardiomegaly with left main and three-vessel coronary arteriosclerosis. Thoracic and abdominal aortic atherosclerosis without visualized aneurysm. 2. Uncomplicated cholelithiasis.  Right hepatic granuloma. 3. Extensive colonic diverticulosis without evidence of acute diverticulitis. No bowel inflammation or obstruction. 4. Intrarenal calculi without obstruction noted bilaterally. Bilateral renal cysts. Stable left adrenal nodule compatible with a benign adenoma. 5. Right posterolateral fat containing hernia as before. Electronically Signed   By: Ashley Royalty M.D.   On: 08/27/2018 15:21   Dg Chest 2 View  Result Date: 08/27/2018 CLINICAL DATA:  Chest pain and cough today. EXAM: CHEST - 2 VIEW COMPARISON:  08/20/2018 FINDINGS: There is opacity in the right lower lobe consistent with pneumonia or atelectasis. Lungs are otherwise clear. No pleural effusion or pneumothorax. Cardiac silhouette is mildly enlarged. Coronary artery stents are stable. No mediastinal or hilar masses. No evidence of adenopathy. Skeletal structures are intact. IMPRESSION: 1. Opacity in the right lower lobe consistent with pneumonia or atelectasis. No other evidence of acute cardiopulmonary disease. 2. Stable cardiomegaly Electronically Signed   By: Lajean Manes M.D.   On: 08/27/2018 13:33    Procedures Procedures (including critical care time)  Medications Ordered in ED Medications  magnesium sulfate IVPB 2 g 50 mL (2 g Intravenous New Bag/Given 08/27/18 1613)  morphine 4 MG/ML injection 4 mg (4 mg Intravenous Given 08/27/18 1427)  ondansetron (ZOFRAN) injection 4 mg (4 mg Intravenous Given 08/27/18 1427)  LORazepam (ATIVAN) injection 0.5 mg (0.5 mg Intravenous Given 08/27/18 1427)  potassium chloride SA (K-DUR,KLOR-CON) CR tablet 60 mEq (60 mEq Oral Given 08/27/18  1607)  morphine 4 MG/ML injection 4 mg (4 mg Intravenous Given 08/27/18 1610)  aspirin chewable tablet 324 mg (324 mg Oral Given 08/27/18 1606)  Initial Impression / Assessment and Plan / ED Course  I have reviewed the triage vital signs and the nursing notes.  Pertinent labs & imaging results that were available during my care of the patient were reviewed by me and considered in my medical decision making (see chart for details).     83 year old female with multiple pain complaints.  She seems primarily concerned about "right hip pain."  I suspect the pain is actually more in her flank though.  She denies any trauma.  Ranging the hip does not seem to elicit any painful response.  She is generally tender in her right abdomen though.  CT as above.  Question whether this may be from cholelithiasis versus this abdominal wall hernia she has.  It only contains fat.  There are no overlying skin changes.  She denies any obstructive symptoms.  Urinalysis is pending but she has no specific urinary complaints.  Abdominal pain is improved after morphine and her chest pain has resolved.  She also complaining of chest pain.  She has known CAD and her troponin is mildly elevated.  Her EKG does not appear significantly changed from prior.  Not sure what to make of her chest pain in light of all of her other pain complaints.  I think she needs to be admitted for trending her cardiac enzymes though.  Chest x-ray also with a possible infiltrate in the right lower lobe.  Read as atelectasis on CT of AP though. She has a history of COPD and says that her breathing is not really significantly different from her baseline.  Right lower lobe inflammatory process could potentially present as right-sided abdominal pain.  I wouldn't necessarily expect her to be tender on exam though.  Hypokalemia noted.  Will supplement.  Magnesium also given.  Final Clinical Impressions(s) / ED Diagnoses   Final diagnoses:  Right  sided abdominal pain  Chest pain, unspecified type  Elevated troponin  Hypokalemia    ED Discharge Orders    None       Virgel Manifold, MD 08/27/18 1637    Virgel Manifold, MD 08/27/18 [phone]

## 2018-08-27 NOTE — Progress Notes (Signed)
Assisted pt to and from the bathroom.

## 2018-08-28 ENCOUNTER — Encounter (HOSPITAL_COMMUNITY): Payer: Self-pay | Admitting: Internal Medicine

## 2018-08-28 ENCOUNTER — Observation Stay (HOSPITAL_BASED_OUTPATIENT_CLINIC_OR_DEPARTMENT_OTHER): Payer: Medicare Other

## 2018-08-28 DIAGNOSIS — I4519 Other right bundle-branch block: Secondary | ICD-10-CM | POA: Diagnosis not present

## 2018-08-28 DIAGNOSIS — F410 Panic disorder [episodic paroxysmal anxiety] without agoraphobia: Secondary | ICD-10-CM | POA: Diagnosis present

## 2018-08-28 DIAGNOSIS — Z6833 Body mass index (BMI) 33.0-33.9, adult: Secondary | ICD-10-CM | POA: Diagnosis not present

## 2018-08-28 DIAGNOSIS — K219 Gastro-esophageal reflux disease without esophagitis: Secondary | ICD-10-CM | POA: Diagnosis present

## 2018-08-28 DIAGNOSIS — K5900 Constipation, unspecified: Secondary | ICD-10-CM | POA: Diagnosis not present

## 2018-08-28 DIAGNOSIS — I214 Non-ST elevation (NSTEMI) myocardial infarction: Secondary | ICD-10-CM

## 2018-08-28 DIAGNOSIS — G8929 Other chronic pain: Secondary | ICD-10-CM | POA: Diagnosis present

## 2018-08-28 DIAGNOSIS — I2510 Atherosclerotic heart disease of native coronary artery without angina pectoris: Secondary | ICD-10-CM | POA: Diagnosis present

## 2018-08-28 DIAGNOSIS — I5032 Chronic diastolic (congestive) heart failure: Secondary | ICD-10-CM | POA: Diagnosis present

## 2018-08-28 DIAGNOSIS — R0789 Other chest pain: Secondary | ICD-10-CM | POA: Diagnosis present

## 2018-08-28 DIAGNOSIS — J9621 Acute and chronic respiratory failure with hypoxia: Secondary | ICD-10-CM | POA: Diagnosis present

## 2018-08-28 DIAGNOSIS — R079 Chest pain, unspecified: Secondary | ICD-10-CM | POA: Diagnosis not present

## 2018-08-28 DIAGNOSIS — I5030 Unspecified diastolic (congestive) heart failure: Secondary | ICD-10-CM

## 2018-08-28 DIAGNOSIS — E876 Hypokalemia: Secondary | ICD-10-CM | POA: Diagnosis present

## 2018-08-28 DIAGNOSIS — E669 Obesity, unspecified: Secondary | ICD-10-CM | POA: Diagnosis present

## 2018-08-28 DIAGNOSIS — R7989 Other specified abnormal findings of blood chemistry: Secondary | ICD-10-CM

## 2018-08-28 DIAGNOSIS — J441 Chronic obstructive pulmonary disease with (acute) exacerbation: Secondary | ICD-10-CM

## 2018-08-28 DIAGNOSIS — R109 Unspecified abdominal pain: Secondary | ICD-10-CM | POA: Diagnosis not present

## 2018-08-28 DIAGNOSIS — I5033 Acute on chronic diastolic (congestive) heart failure: Secondary | ICD-10-CM | POA: Diagnosis present

## 2018-08-28 DIAGNOSIS — I130 Hypertensive heart and chronic kidney disease with heart failure and stage 1 through stage 4 chronic kidney disease, or unspecified chronic kidney disease: Secondary | ICD-10-CM | POA: Diagnosis present

## 2018-08-28 DIAGNOSIS — K5909 Other constipation: Secondary | ICD-10-CM | POA: Diagnosis present

## 2018-08-28 DIAGNOSIS — N179 Acute kidney failure, unspecified: Secondary | ICD-10-CM | POA: Diagnosis not present

## 2018-08-28 DIAGNOSIS — I25118 Atherosclerotic heart disease of native coronary artery with other forms of angina pectoris: Secondary | ICD-10-CM

## 2018-08-28 DIAGNOSIS — I10 Essential (primary) hypertension: Secondary | ICD-10-CM | POA: Diagnosis not present

## 2018-08-28 DIAGNOSIS — N182 Chronic kidney disease, stage 2 (mild): Secondary | ICD-10-CM | POA: Diagnosis present

## 2018-08-28 DIAGNOSIS — J440 Chronic obstructive pulmonary disease with acute lower respiratory infection: Secondary | ICD-10-CM | POA: Diagnosis present

## 2018-08-28 DIAGNOSIS — E785 Hyperlipidemia, unspecified: Secondary | ICD-10-CM

## 2018-08-28 DIAGNOSIS — H9190 Unspecified hearing loss, unspecified ear: Secondary | ICD-10-CM | POA: Diagnosis present

## 2018-08-28 DIAGNOSIS — M545 Low back pain: Secondary | ICD-10-CM | POA: Diagnosis present

## 2018-08-28 DIAGNOSIS — J181 Lobar pneumonia, unspecified organism: Secondary | ICD-10-CM | POA: Diagnosis present

## 2018-08-28 DIAGNOSIS — Z9981 Dependence on supplemental oxygen: Secondary | ICD-10-CM | POA: Diagnosis not present

## 2018-08-28 DIAGNOSIS — I491 Atrial premature depolarization: Secondary | ICD-10-CM | POA: Diagnosis present

## 2018-08-28 HISTORY — DX: Non-ST elevation (NSTEMI) myocardial infarction: I21.4

## 2018-08-28 LAB — CBC
HCT: 42.2 % (ref 36.0–46.0)
HEMOGLOBIN: 13 g/dL (ref 12.0–15.0)
MCH: 29.1 pg (ref 26.0–34.0)
MCHC: 30.8 g/dL (ref 30.0–36.0)
MCV: 94.6 fL (ref 80.0–100.0)
Platelets: 195 10*3/uL (ref 150–400)
RBC: 4.46 MIL/uL (ref 3.87–5.11)
RDW: 11.9 % (ref 11.5–15.5)
WBC: 6.5 10*3/uL (ref 4.0–10.5)
nRBC: 0 % (ref 0.0–0.2)

## 2018-08-28 LAB — COMPREHENSIVE METABOLIC PANEL
ALBUMIN: 3.4 g/dL — AB (ref 3.5–5.0)
ALT: 13 U/L (ref 0–44)
AST: 18 U/L (ref 15–41)
Alkaline Phosphatase: 42 U/L (ref 38–126)
Anion gap: 9 (ref 5–15)
BUN: 20 mg/dL (ref 8–23)
CHLORIDE: 101 mmol/L (ref 98–111)
CO2: 31 mmol/L (ref 22–32)
Calcium: 8.9 mg/dL (ref 8.9–10.3)
Creatinine, Ser: 1.24 mg/dL — ABNORMAL HIGH (ref 0.44–1.00)
GFR calc Af Amer: 46 mL/min — ABNORMAL LOW (ref >60)
GFR calc non Af Amer: 40 mL/min — ABNORMAL LOW (ref >60)
GLUCOSE: 105 mg/dL — AB (ref 70–99)
POTASSIUM: 3.7 mmol/L (ref 3.5–5.1)
Sodium: 141 mmol/L (ref 135–145)
TOTAL PROTEIN: 5.9 g/dL — AB (ref 6.5–8.1)
Total Bilirubin: 0.4 mg/dL (ref 0.3–1.2)

## 2018-08-28 LAB — BASIC METABOLIC PANEL
Anion gap: 6 (ref 5–15)
BUN: 24 mg/dL — ABNORMAL HIGH (ref 8–23)
CHLORIDE: 100 mmol/L (ref 98–111)
CO2: 29 mmol/L (ref 22–32)
Calcium: 8.9 mg/dL (ref 8.9–10.3)
Creatinine, Ser: 1.23 mg/dL — ABNORMAL HIGH (ref 0.44–1.00)
GFR calc non Af Amer: 40 mL/min — ABNORMAL LOW (ref >60)
GFR, EST AFRICAN AMERICAN: 46 mL/min — AB (ref >60)
GLUCOSE: 142 mg/dL — AB (ref 70–99)
Potassium: 4.1 mmol/L (ref 3.5–5.1)
Sodium: 135 mmol/L (ref 135–145)

## 2018-08-28 LAB — ECHOCARDIOGRAM COMPLETE
Height: 64 in
WEIGHTICAEL: 3209.9 [oz_av]

## 2018-08-28 LAB — TROPONIN I
TROPONIN I: 0.15 ng/mL — AB (ref <0.03)
Troponin I: 0.12 ng/mL (ref <0.03)

## 2018-08-28 MED ORDER — GABAPENTIN 300 MG PO CAPS
300.0000 mg | ORAL_CAPSULE | Freq: Four times a day (QID) | ORAL | Status: DC
  Administered 2018-08-28 – 2018-09-01 (×14): 300 mg via ORAL
  Filled 2018-08-28 (×13): qty 1

## 2018-08-28 MED ORDER — CARVEDILOL 6.25 MG PO TABS
6.2500 mg | ORAL_TABLET | Freq: Two times a day (BID) | ORAL | Status: DC
  Administered 2018-08-28 – 2018-09-01 (×8): 6.25 mg via ORAL
  Filled 2018-08-28 (×8): qty 1

## 2018-08-28 MED ORDER — METHYLPREDNISOLONE SODIUM SUCC 40 MG IJ SOLR
40.0000 mg | Freq: Four times a day (QID) | INTRAMUSCULAR | Status: DC
  Administered 2018-08-28 – 2018-08-29 (×4): 40 mg via INTRAVENOUS
  Filled 2018-08-28 (×4): qty 1

## 2018-08-28 MED ORDER — LACTULOSE 10 GM/15ML PO SOLN
20.0000 g | Freq: Two times a day (BID) | ORAL | Status: DC | PRN
  Administered 2018-08-28: 20 g via ORAL
  Filled 2018-08-28: qty 30

## 2018-08-28 MED ORDER — IPRATROPIUM-ALBUTEROL 0.5-2.5 (3) MG/3ML IN SOLN
3.0000 mL | Freq: Four times a day (QID) | RESPIRATORY_TRACT | Status: AC
  Administered 2018-08-28 – 2018-08-29 (×4): 3 mL via RESPIRATORY_TRACT
  Filled 2018-08-28 (×4): qty 3

## 2018-08-28 MED ORDER — TROLAMINE SALICYLATE 10 % EX CREA: 1.0000 "application " | TOPICAL_CREAM | Freq: Every day | CUTANEOUS | Status: DC | PRN

## 2018-08-28 MED ORDER — MUSCLE RUB 10-15 % EX CREA
TOPICAL_CREAM | Freq: Every day | CUTANEOUS | Status: DC | PRN
  Filled 2018-08-28: qty 85

## 2018-08-28 MED ORDER — IPRATROPIUM-ALBUTEROL 0.5-2.5 (3) MG/3ML IN SOLN: 3.0000 mL | Freq: Four times a day (QID) | RESPIRATORY_TRACT | Status: DC

## 2018-08-28 MED ORDER — AMITRIPTYLINE HCL 10 MG PO TABS
10.0000 mg | ORAL_TABLET | Freq: Every evening | ORAL | Status: DC | PRN
  Filled 2018-08-28: qty 2

## 2018-08-28 MED ORDER — ACETAMINOPHEN 325 MG PO TABS
650.0000 mg | ORAL_TABLET | Freq: Four times a day (QID) | ORAL | Status: DC | PRN
  Administered 2018-08-28 – 2018-09-01 (×4): 650 mg via ORAL
  Filled 2018-08-28 (×4): qty 2

## 2018-08-28 MED ORDER — MORPHINE SULFATE (PF) 2 MG/ML IV SOLN
1.0000 mg | INTRAVENOUS | Status: DC | PRN
  Administered 2018-08-28 – 2018-08-30 (×3): 1 mg via INTRAVENOUS
  Filled 2018-08-28 (×3): qty 1

## 2018-08-28 MED ORDER — GUAIFENESIN ER 600 MG PO TB12
600.0000 mg | ORAL_TABLET | Freq: Two times a day (BID) | ORAL | Status: DC | PRN
  Administered 2018-08-29 – 2018-09-01 (×4): 600 mg via ORAL
  Filled 2018-08-28 (×4): qty 1

## 2018-08-28 MED ORDER — MAGNESIUM CITRATE PO SOLN
1.0000 | Freq: Once | ORAL | Status: AC
  Administered 2018-08-28: 1 via ORAL
  Filled 2018-08-28: qty 296

## 2018-08-28 MED ORDER — VITAMIN D 1000 UNITS PO TABS
1000.0000 [IU] | ORAL_TABLET | Freq: Every day | ORAL | Status: DC
  Administered 2018-08-28 – 2018-09-01 (×5): 1000 [IU] via ORAL
  Filled 2018-08-28 (×6): qty 1

## 2018-08-28 MED ORDER — FLUTICASONE PROPIONATE 50 MCG/ACT NA SUSP
2.0000 | Freq: Every day | NASAL | Status: DC | PRN
  Administered 2018-08-30 – 2018-09-01 (×2): 2 via NASAL
  Filled 2018-08-28 (×2): qty 16

## 2018-08-28 MED ORDER — ENOXAPARIN SODIUM 100 MG/ML ~~LOC~~ SOLN
90.0000 mg | Freq: Two times a day (BID) | SUBCUTANEOUS | Status: DC
  Administered 2018-08-28 – 2018-08-30 (×5): 90 mg via SUBCUTANEOUS
  Filled 2018-08-28 (×5): qty 1

## 2018-08-28 MED ORDER — DOXYCYCLINE HYCLATE 100 MG PO TABS
100.0000 mg | ORAL_TABLET | Freq: Two times a day (BID) | ORAL | Status: DC
  Administered 2018-08-28 – 2018-09-01 (×9): 100 mg via ORAL
  Filled 2018-08-28 (×9): qty 1

## 2018-08-28 NOTE — Consult Note (Signed)
Cardiology Consultation:   Patient ID: [name]; [id number]; October 05, 1935   Admit date: 08/27/2018 Date of Consult: 08/28/2018  Primary Care Provider: Leonard Downing, MD Primary Cardiologist: Dr. Percival Spanish, MD   Patient Profile:   [name] is a 83 y.o. female with a hx of CAD s/p prior stent placement (LAD, diagonal and RCA and pLCX), COPD, HTN, HLD and GERD who is being seen today for the evaluation of chest pain at the request of Dr. Wyline Copas.   History of Present Illness:   [name] is an 83yo F with a hx as stated above who presented to [facility] on 08/27/18 with initial complaints of worsening DOE and midsternal chest discomfort. She is noted to be a poor historian with history difficult to obtain. She states that she frequently has intermittent chest discomfort however, she is unable to determine how long or duration. She reports that she lives with her sister and wears supplemental oxygen for her COPD.  She denies palpitations, dizziness, presyncopal or syncopal episodes.  She states that she may have had a recent fever with chills however, no nausea or vomiting or diaphoresis.  She reports the chest discomfort does not radiate to arms or back.  She states that she has had lower extremity swelling and has orthopnea symptoms.  She ambulates with a walker and reports full medication compliance.  Per chart review, she had initial complaints of right hip pain and increased abdominal girth. Therefore, in the ED, she underwent abdominal CT which showed no acute findings. She was given one dose of morphine for the chest pain which then resolved. Initial istat trop found to be mildly elevated at 0.09 with subsequent readings at 0.21>0.15. EKG unchanged form prior tracings with no acute ischemic changes. CXR with opacity in RLL consistent with PNA versus atelectasis with no other evidence of cardiopulmonary disease and stable cardiomegaly. Given her cardiac risk factors prior stenting and  elevated troponin cardiology was consulted for further evaluation.   Of note, she was last seen by Dr. Percival Spanish 03/27/2018 in which she was noted to have been recently hospitalized in April 2019 for COPD exacerbation as well as an ED visit for constipation.  During her visit, she reported many somatic complaints with chest discomfort however would points to her upper epigastric area and this was unclear as to whether this is similar to previous angina.  Her dyspnea was thought to be multifactorial however could be her anginal equivalent.  Her Imdur was increased to 120 mg daily.  She had a recent negative stress test in January 2016 which was reassuring.  Past Medical History:  Diagnosis Date  . Arthritis    "in my spine" (04/15/2018)  . Blurred vision   . CAD (coronary artery disease)    Multiple percutaneous revascularization. Catheterization May 2013 left main normal, patent LAD stent, 90% circumflex stenosis, patent right coronary artery stents. Patient had a DES to the circumflex. The EF was well-preserved.  . Chronic lower back pain   . COPD (chronic obstructive pulmonary disease) ([place])   . Fatigue   . GERD (gastroesophageal reflux disease)   . Hard of hearing   . HTN (hypertension)   . Hx of radiation therapy    right nose 4500 cGy in 10 sessions over 5 weeks (2 treatments a week)  . Hyperlipemia   . On home oxygen therapy    "2L; 24/7" (08/27/2018)  . Pneumonia    "I've had a touch of it" (04/15/2018)  .  Squamous cell carcinoma of nose    right    Past Surgical History:  Procedure Laterality Date  . ABDOMINAL HYSTERECTOMY    . BACK SURGERY  ?  . CORONARY ANGIOPLASTY WITH STENT PLACEMENT  2002-2009  . CORONARY ANGIOPLASTY WITH STENT PLACEMENT  05/06/12   "1; this makes 5"  . DILATION AND CURETTAGE OF UTERUS    . ESOPHAGEAL DILATION  ~ 2012   Dr. Deatra Ina  . LUMBAR SPINE SURGERY  04/2011   Rods, screws and cages  . PERCUTANEOUS CORONARY STENT INTERVENTION (PCI-S) N/A 05/06/2012     Procedure: PERCUTANEOUS CORONARY STENT INTERVENTION (PCI-S);  Surgeon: Burnell Blanks, MD;  Location: [facility] CATH LAB;  Service: Cardiovascular;  Laterality: N/A;  . SKIN BIOPSY  10/15/13   right nose-microinvasive squamous cell carcinoma  . TUBAL LIGATION       Prior to Admission medications   Medication Sig Start Date End Date Taking? Authorizing Provider  acetaminophen (TYLENOL 8 HOUR) 650 MG CR tablet Take 1 tablet (650 mg total) by mouth every 8 (eight) hours as needed. Patient taking differently: Take 650 mg by mouth every 8 (eight) hours as needed for pain or fever.  03/16/18  Yes Varney Biles, MD  albuterol (PROVENTIL HFA;VENTOLIN HFA) 108 (90 BASE) MCG/ACT inhaler Inhale 2 puffs into the lungs every 4 (four) hours as needed for wheezing. 12/01/14  Yes Delfina Redwood, MD  amitriptyline (ELAVIL) 10 MG tablet Take 10-20 mg by mouth at bedtime as needed (for pain).    Yes [provider]  arformoterol (BROVANA) 15 MCG/2ML NEBU Take 2 mLs (15 mcg total) by nebulization 2 (two) times daily. 11/28/17  Yes Mannam, Praveen, MD  aspirin 81 MG tablet Take 1 tablet (81 mg total) by mouth daily. 10/13/17  Yes Cherene Altes, MD  budesonide (PULMICORT) 0.25 MG/2ML nebulizer solution Take 2 mLs (0.25 mg total) by nebulization 2 (two) times daily. Patient taking differently: Take 0.25 mg by nebulization 4 (four) times daily.  11/28/17 11/28/18 Yes Mannam, Praveen, MD  carvedilol (COREG) 3.125 MG tablet Take 1 tablet (3.125 mg total) by mouth 2 (two) times daily with a meal. 10/12/17  Yes Cherene Altes, MD  cholecalciferol (VITAMIN D) 1000 UNITS tablet Take 1,000 Units by mouth daily.    Yes [provider]  dicyclomine (BENTYL) 20 MG tablet Take 1 tablet (20 mg total) by mouth 3 (three) times daily as needed for spasms. Cramping abdominal pain. 08/16/17  Yes Charlesetta Shanks, MD  docusate sodium (COLACE) 100 MG capsule Take 1 capsule (100 mg total) by mouth every 12  (twelve) hours. 08/16/17  Yes Charlesetta Shanks, MD  fluticasone (FLONASE) 50 MCG/ACT nasal spray Place 2 sprays into both nostrils daily as needed for allergies or rhinitis. 10/12/17  Yes Cherene Altes, MD  gabapentin (NEURONTIN) 300 MG capsule Take 300 mg by mouth See admin instructions. Take one capsule by mouth every 4 to 6 hours (max 4 capsules per 24 hours) 08/18/18  Yes [provider]  guaiFENesin (MUCINEX) 600 MG 12 hr tablet Take 1 tablet (600 mg total) by mouth 2 (two) times daily. Patient taking differently: Take 600 mg by mouth 2 (two) times daily as needed for to loosen phlegm.  02/03/13  Yes Kathie Dike, MD  ibuprofen (ADVIL,MOTRIN) 400 MG tablet Take 1 tablet (400 mg total) by mouth every 6 (six) hours as needed. 03/16/18  Yes Varney Biles, MD  isosorbide mononitrate (IMDUR) 120 MG 24 hr tablet Take 1 tablet (120  mg total) by mouth daily. 03/27/18  Yes Minus Breeding, MD  lubiprostone (AMITIZA) 24 MCG capsule Take 1 capsule (24 mcg total) by mouth 2 (two) times daily with a meal. 12/23/17  Yes Nandigam, Venia Minks, MD  nitroGLYCERIN (NITROSTAT) 0.4 MG SL tablet Place 0.4 mg under the tongue every 5 (five) minutes as needed for chest pain.   Yes [provider]  polyethylene glycol (MIRALAX) packet Take 17 g by mouth daily. 03/16/18  Yes Nanavati, Ankit, MD  psyllium (METAMUCIL SMOOTH TEXTURE) 28 % packet Take 1 packet by mouth 2 (two) times daily as needed (constipation).    Yes [provider]  ranitidine (ZANTAC) 300 MG tablet Take 0.5 tablets (150 mg total) by mouth 2 (two) times daily. 02/21/18  Yes Ledell Noss, MD  trolamine salicylate (ASPERCREME) 10 % cream Apply 1 application topically daily as needed for muscle pain.   Yes [provider]  Revefenacin (YUPELRI) 175 MCG/3ML SOLN Inhale 3 mLs into the lungs daily. J44.9 08/20/18   Mannam, Hart Robinsons, MD  calcium carbonate (OS-CAL) 600 MG TABS Take 600 mg by mouth daily.    02/04/12  [provider]  pravastatin (PRAVACHOL) 40 MG tablet Take 40 mg by mouth daily.    02/04/12  [provider]    Inpatient Medications: Scheduled Meds: . arformoterol  15 mcg Nebulization BID  . aspirin  81 mg Oral Daily  . budesonide  0.25 mg Nebulization BID  . carvedilol  3.125 mg Oral BID WC  . docusate sodium  100 mg Oral Q12H  . enoxaparin (LOVENOX) injection  40 mg Subcutaneous Q24H  . isosorbide mononitrate  120 mg Oral Daily  . lubiprostone  24 mcg Oral BID WC  . polyethylene glycol  17 g Oral Daily   Continuous Infusions:  PRN Meds: ipratropium-albuterol, ketorolac, nitroGLYCERIN  Allergies:    Allergies  Allergen Reactions  . Iohexol Other (See Comments)    Code:  HIVES, Desc:  PER ROBIN @ [facility], PT IS ALLERGIC TO IVP DYE 08/28/10 RM  . Lipitor [Atorvastatin Calcium] Hives    Social History:   Social History   Socioeconomic History  . Marital status: Widowed    Spouse name: Not on file  . Number of children: 4  . Years of education: Not on file  . Highest education level: Not on file  Occupational History  . Occupation: retired    Fish farm manager: RETIRED  Social Needs  . Financial resource strain: Not on file  . Food insecurity:    Worry: Not on file    Inability: Not on file  . Transportation needs:    Medical: Not on file    Non-medical: Not on file  Tobacco Use  . Smoking status: Former Smoker    Packs/day: 0.50    Years: 55.00    Pack years: 27.50    Types: Cigarettes    Last attempt to quit: 11/28/2012    Years since quitting: 5.7  . Smokeless tobacco: Never Used  Substance and Sexual Activity  . Alcohol use: Never    Alcohol/week: 0.0 standard drinks    Frequency: Never    Comment: "drank one time; never again"  . Drug use: Never  . Sexual activity: Not Currently  Lifestyle  . Physical activity:    Days per week: Not on file    Minutes per session: Not on file  . Stress: Not on file  Relationships  . Social connections:    Talks on  phone: Not  on file    Gets together: Not on file    Attends religious service: Not on file    Active member of club or organization: Not on file    Attends meetings of clubs or organizations: Not on file    Relationship status: Not on file  . Intimate partner violence:    Fear of current or ex partner: Not on file    Emotionally abused: Not on file    Physically abused: Not on file    Forced sexual activity: Not on file  Other Topics Concern  . Not on file  Social History Narrative  . Not on file    Family History:   Family History  Problem Relation Age of Onset  . Glaucoma Father   . Heart attack Father   . Bone cancer Mother    Family Status:  Family Status  Relation Name Status  . Father  Deceased  . Mother  Deceased at age 64  . MGM  Deceased  . MGF  Deceased  . PGM  Deceased  . PGF  Deceased    ROS:  Please see the history of present illness.  All other ROS reviewed and negative.     Physical Exam/Data:   Vitals:   08/27/18 1800 08/27/18 1900 08/27/18 2317 08/28/18 0556  BP: (!) 126/97 (!) 174/86 136/61 (!) 166/79  Pulse:  85 70 76  Resp: 17 18 18 18   Temp:  98.5 F (36.9 C) 97.9 F (36.6 C) 98 F (36.7 C)  TempSrc:  Oral Oral Oral  SpO2:  97% 96% 98%  Weight:      Height:        Intake/Output Summary (Last 24 hours) at 08/28/2018 0747 Last data filed at 08/27/2018 2100 Gross per 24 hour  Intake 240 ml  Output -  Net 240 ml   Filed Weights   08/27/18 1214  Weight: 91 kg   Body mass index is 34.44 kg/m.   General: Elderly, NAD Skin: Warm, dry, intact  Head: Normocephalic, atraumatic, clear, moist mucus membranes. Neck: Negative for carotid bruits. No JVD Lungs: Expiratory wheezing bilaterally upper and lower lobes. Breathing is unlabored.  Nasal cannula Cardiovascular: RRR with S1 S2. No murmurs, rubs, gallops, or LV heave appreciated. Abdomen: Soft, non-tender, non-distended with normoactive bowel sounds. No obvious abdominal  masses. MSK: Strength and tone appear normal for age. 5/5 in all extremities Extremities: No edema. No clubbing or cyanosis. DP/PT pulses 2+ bilaterally Neuro: Alert and oriented. No focal deficits. No facial asymmetry. MAE spontaneously. Psych: Responds to questions appropriately with normal affect.     EKG:  The EKG was personally reviewed and demonstrates: NSR with no acute ischemic changes 08/27/2018 Telemetry:  Telemetry was personally reviewed and demonstrates: NSR with PACs, artifact  Relevant CV Studies:  ECHO: 10/07/17: Study Conclusions  - Left ventricle: The cavity size was normal. There was moderate   concentric hypertrophy. Systolic function was normal. The   estimated ejection fraction was in the range of 60% to 65%. Wall   motion was normal; there were no regional wall motion   abnormalities. Doppler parameters are consistent with elevated   mean left atrial filling pressure. - Left atrium: The atrium was mildly dilated.  Lexiscan Myoview Stress: 11/29/2014:   IMPRESSION: Normal stress nuclear study with chest pain but no electrocardiographic changes. The scintigraphic results show no evidence of ischemia or infarction in any vascular territory. The gated ejection fraction was 81% and the wall motion was normal.  Cath 05/01/2012: Impression: 1. Triple vessel CAD with patent stents in the LAD, diagonal, RCA. 2. Severe stenosis in the proximal Circumflex.  3. Preserved LV systolic function.   Recommendations: I will start Plavix today and will stage PCI of the Circumflex artery next week.    CATH: 04/03/2011: OVERALL IMPRESSION:  1.  Normal left ventricular function.  2.  Persistent patency of the stents in the left anterior descending      proximally and at the bifurcation lesion with the second diagonal left      anterior descending and persistent patency of the stents in the right      coronary artery, but with residual disease in the proximal left       circumflex, first diagonal vessel, and second diagonal vessel after the      stent location.  Cath 07/11/2000: OVERALL IMPRESSION: 1. Normal left ventricular function. 2. Persistent patency of the stent to the right coronary artery and left    anterior descending. 3. Residual 40-50% narrowing of the left circumflex, 50-70% of the distal    first diagonal and distal left anterior descending, and mild irregularities    in the right coronary artery.  Laboratory Data:  Chemistry Recent Labs  Lab 08/27/18 1224 08/28/18 0312  NA 140 141  K 2.8* 3.7  CL 99 101  CO2 27 31  GLUCOSE 106* 105*  BUN 15 20  CREATININE 1.12* 1.24*  CALCIUM 9.8 8.9  GFRNONAA 45* 40*  GFRAA 52* 46*  ANIONGAP 14 9    Total Protein  Date Value Ref Range Status  08/28/2018 5.9 (L) 6.5 - 8.1 g/dL Final   Albumin  Date Value Ref Range Status  08/28/2018 3.4 (L) 3.5 - 5.0 g/dL Final   AST  Date Value Ref Range Status  08/28/2018 18 15 - 41 U/L Final   ALT  Date Value Ref Range Status  08/28/2018 13 0 - 44 U/L Final   Alkaline Phosphatase  Date Value Ref Range Status  08/28/2018 42 38 - 126 U/L Final   Total Bilirubin  Date Value Ref Range Status  08/28/2018 0.4 0.3 - 1.2 mg/dL Final   Hematology Recent Labs  Lab 08/27/18 1224 08/28/18 0312  WBC 7.3 6.5  RBC 5.12* 4.46  HGB 15.0 13.0  HCT 47.5* 42.2  MCV 92.8 94.6  MCH 29.3 29.1  MCHC 31.6 30.8  RDW 11.7 11.9  PLT 205 195   Cardiac Enzymes Recent Labs  Lab 08/27/18 1948 08/28/18 0312  TROPONINI 0.21* 0.15*    Recent Labs  Lab 08/27/18 1259  TROPIPOC 0.09*    BNPNo results for input(s): BNP, PROBNP in the last 168 hours.  DDimer No results for input(s): DDIMER in the last 168 hours. TSH:  Lab Results  Component Value Date   TSH 0.469 01/31/2013   Lipids: Lab Results  Component Value Date   CHOL 194 10/06/2017   HDL 64 10/06/2017   LDLCALC 122 (H) 10/06/2017   TRIG 38 10/06/2017   CHOLHDL 3.0 10/06/2017    HgbA1c: Lab Results  Component Value Date   HGBA1C 6.5 (H) 10/06/2017    Radiology/Studies:  Ct Abdomen Pelvis Wo Contrast  Result Date: 08/27/2018 CLINICAL DATA:  Generalized abdominal and flank pain. EXAM: CT ABDOMEN AND PELVIS WITHOUT CONTRAST TECHNIQUE: Multidetector CT imaging of the abdomen and pelvis was performed following the standard protocol without IV contrast. COMPARISON:  04/14/2018 FINDINGS: Lower chest: Cardiomegaly with aortic atherosclerosis and dense left main and three-vessel coronary arteriosclerosis. No  pericardial effusion or thickening. Dependent and subsegmental atelectasis at lung bases. Hepatobiliary: Biliary sludge and calculi noted within the gallbladder without wall thickening or pericholecystic fluid. Calcified granuloma in the right hepatic lobe adjacent to gallbladder. No biliary dilatation. Pancreas: Normal Spleen: Normal Adrenals/Urinary Tract: Hypodense left adrenal nodule measuring 13 x 15 mm compatible with a benign relatively stable since prior. Punctate nonobstructing bilateral calcifications noted within both kidneys some which may be vascular. Simple appearing cysts arising off the lower poles both kidneys, on left measuring 2.1 x 2.2 x 1.8 cm and on right, 2.8 x 2 x 2.7 cm. Smaller parapelvic left-sided renal cysts are also suspected as well as cortically based cysts. No hydroureteronephrosis. The urinary bladder is unremarkable for the degree distention. Stomach/Bowel: Physiologic distention of the stomach. Normal small bowel rotation without obstruction. Redemonstration of extensive colonic diverticulosis without acute diverticulitis. The appendix is not confidently identified but no pericecal inflammatory change is noted. Vascular/Lymphatic: Moderate marked aortoiliac atherosclerosis without aneurysm. No lymphadenopathy by CT size criteria. Reproductive: Hysterectomy.  No adnexal mass. Other: No free air nor free fluid. Small right lateral hernia containing  fat. Musculoskeletal: Stigmata posterior lumbar interbody fusion L1 through L5. Spondylosis included thoracolumbar spine otherwise noted without acute osseous appearing abnormality. IMPRESSION: 1. Cardiomegaly with left main and three-vessel coronary arteriosclerosis. Thoracic and abdominal aortic atherosclerosis without visualized aneurysm. 2. Uncomplicated cholelithiasis.  Right hepatic granuloma. 3. Extensive colonic diverticulosis without evidence of acute diverticulitis. No bowel inflammation or obstruction. 4. Intrarenal calculi without obstruction noted bilaterally. Bilateral renal cysts. Stable left adrenal nodule compatible with a benign adenoma. 5. Right posterolateral fat containing hernia as before. Electronically Signed   By: Ashley Royalty M.D.   On: 08/27/2018 15:21   Dg Chest 2 View  Result Date: 08/27/2018 CLINICAL DATA:  Chest pain and cough today. EXAM: CHEST - 2 VIEW COMPARISON:  08/20/2018 FINDINGS: There is opacity in the right lower lobe consistent with pneumonia or atelectasis. Lungs are otherwise clear. No pleural effusion or pneumothorax. Cardiac silhouette is mildly enlarged. Coronary artery stents are stable. No mediastinal or hilar masses. No evidence of adenopathy. Skeletal structures are intact. IMPRESSION: 1. Opacity in the right lower lobe consistent with pneumonia or atelectasis. No other evidence of acute cardiopulmonary disease. 2. Stable cardiomegaly Electronically Signed   By: Lajean Manes M.D.   On: 08/27/2018 13:33   Assessment and Plan:   1.  Chest pain with history of CAD with positive troponin and prior stenting: -Patient presented to [facility] on 08/27/2018 with complaints of intermittent midsternal chest discomfort without radiation and unknown duration. History very difficult to obtain. Per chart review patient reported initial right hip pain and increased abdominal girth then after further assessment revealed midsternal chest discomfort. She has history of prior  stenting dating back to 2000/2001 and most recently had a reassuring stress test in 11/29/2014 without ischemia or infarct.   -CXR concerning for RLL  PNA>> with diffuse expiratory wheezing on exam -Last cardiac cath 2013 with triple-vessel CAD with patent stents in the LAD, diagonal and RCA with severe stenosis in the proximal circumflex >>> last stent placement 05/06/2012  -Last echocardiogram 2018 with LVEF of 60 to 65% no wall motion abnormalities -Echocardiogram this admission, pending -EKG without acute ischemic changes, similar to prior tracings -Troponin, 0.09> 0.21> 0.15 -Currently denies chest discomfort -Continue ASA 81, carvedilol 3.125, isosorbide mononitrate 120 -No statin secondary to allergy -Given poor history, difficult to determine appropriate plan of care however, given history of prior stenting consider  stress test prior to discharge based on echocardiogram results otherwise, would lean towards medical management given advanced age and multiple chronic comorbid conditions -If recurrent chest pain Ranexa can be considered as well  2.  Dyspnea on exertion history of COPD: -Likely multifactorial, long history of dyspnea on exertion with history of COPD and home supplemental oxygen -CXR on presentation concerning for RLL PNA with opacities and diffuse expiratory wheezing on exam -Continue O2 support, bronchodilators per primary team  3.  HTN: -Elevated, 166/79, 136/61, 124/86, 126/97 -Continue carvedilol, isosorbide mononitrate -Will increase carvedilol to 6.25  4.  HLD: -Uncontrolled, last LDL 122 on 10/06/2017 with goal of LDL <70 -Will likely need referral for possible PCSK9 at discharge given history of statin intolerance/allergy  5.  CKD stage II: -Creatinine, 1.24 today -Baseline appears to be in the 1.0 range -Continue to avoid nephrotoxic medications, possibly in the setting of dehydration -Follow with daily BMET  For questions or updates, please contact [place] Please consult [email or web address] for contact info under Cardiology/STEMI.   SignedKathyrn Drown NP-C [facility] Pager: [phone] 08/28/2018 7:47 AM

## 2018-08-28 NOTE — Progress Notes (Signed)
Spoke with pt's daughter Fraser Din per pt's request and made her aware of pt's transfer to [place].

## 2018-08-28 NOTE — Care Management Obs Status (Signed)
[facility] NOTIFICATION   Patient Details  Name: [name] MRN: [id number] Date of Birth: 1935-06-09   Medicare Observation Status Notification Given:  Yes    Midge Minium RN, BSN, NCM-BC, ACM-RN [phone] 08/28/2018, 3:05 PM

## 2018-08-28 NOTE — Progress Notes (Addendum)
[facility] PROGRESS NOTE  [name] EUM:[id number] DOB: 14-Oct-1935 DOA: 08/27/2018 PCP: Leonard Downing, MD  Assessment/Plan: 1. Chest pain/NSTEMI Patient with known CAD s/p prior stent placement. Initial trop of 0.09 and trended upward.ekg with SR borderline axis deviation, chest xray with infiltrate vs atelectasis. Echo with ef 60%, moderate LVH and grade 1 diastolic dysfunction. She is afebrile and non-toxic appearing.  Evaluated by cards who opine NSTEMI. Patient refuses cath. Medical management.  - full dose lovenox per pharmacy -aspirin -continue carvedilol -ntg for chest pain -doxycycline for possible cap  2. Abd pain/constipation. Hx of same 1. CT reveals Stool noted throughout transverse colon and in ascending colon, increased colonic 2. Patient known to [facility] for constipation, followed as outpatient 3. Only fair results so far.  4. Will provide mag citrate 1 bottle 5. Full liquid diet 3. HTN 1. BP high end of normal.  2. Cont home meds as tolerated; of note carvedilol increased to 6.25 per cards 4. Constipation 1. Per above 2. Lactulose as well 5. COPD with exacerbation.  oxygen saturation level 94 on 2L. Imaging concerning for infiltrate. Increased wheezing and sob.   1. Start solumedrol 2. Change nebs to scheduled 3. mobilize 6. HLD 1. Stable at present 2. Cont home meds as tolerated   Acute kidney injury. Creatinine 1.2 today. Likely related to above.  -hold nephrotoxins -monitor urine output  Code Status: full Family Communication: none present Disposition Plan: home when ready   Consultants:  Cardiology dr Harrell Gave  Procedures:  echo  Antibiotics:  Doxycycline 08/28/18>>  HPI/Subjective: Patient moaning and complaining of "hurting all over". Very HOH   [name] is a 83 y.o. female with medical history significant of CAD s/p prior stent placement, COPD, HTN, HLD, GERD, constipation who presented to the ED on  10/10 with initial complaints of R sided "hip" pain. Of note, pt is poor historian and detailed history was difficult to obtain. Patient did complain of constantly increased abd girth with continued daily bowel movement, however only small amount of stool per episode. Pt reported chest pains located mid-sternally, worse with cough and with palpation.  Objective: Vitals:   08/28/18 0556 08/28/18 1221  BP: (!) 166/79 (!) 157/97  Pulse: 76 77  Resp: 18 19  Temp: 98 F (36.7 C) 98.5 F (36.9 C)  SpO2: 98% 94%    Intake/Output Summary (Last 24 hours) at 08/28/2018 1515 Last data filed at 08/28/2018 0900 Gross per 24 hour  Intake 480 ml  Output -  Net 480 ml   Filed Weights   08/27/18 1214  Weight: 91 kg    Exam:   General:  Obese pale very HOH in mild distress  Cardiovascular: rrr no mgr trace LE edema  Respiratory:  increased work of breathing. Expiratory moaning. Diffuse audible wheeze  Abdomen: obese soft mild diffuse tenderness to palpation particualrly RUQ. Very sluggish BS. No guarding or rebounding  Musculoskeletal: joints without swelling/erythema   Data Reviewed: Basic Metabolic Panel: Recent Labs  Lab 08/27/18 1224 08/28/18 0312  NA 140 141  K 2.8* 3.7  CL 99 101  CO2 27 31  GLUCOSE 106* 105*  BUN 15 20  CREATININE 1.12* 1.24*  CALCIUM 9.8 8.9   Liver Function Tests: Recent Labs  Lab 08/27/18 1224 08/28/18 0312  AST 21 18  ALT 13 13  ALKPHOS 49 42  BILITOT 0.4 0.4  PROT 6.6 5.9*  ALBUMIN 3.7 3.4*   Recent Labs  Lab 08/27/18 1224  LIPASE  39   No results for input(s): AMMONIA in the last 168 hours. CBC: Recent Labs  Lab 08/27/18 1224 08/28/18 0312  WBC 7.3 6.5  HGB 15.0 13.0  HCT 47.5* 42.2  MCV 92.8 94.6  PLT 205 195   Cardiac Enzymes: Recent Labs  Lab 08/27/18 1948 08/28/18 0312 08/28/18 0726  TROPONINI 0.21* 0.15* 0.12*   BNP (last 3 results) No results for input(s): BNP in the last 8760 hours.  ProBNP (last 3  results) No results for input(s): PROBNP in the last 8760 hours.  CBG: No results for input(s): GLUCAP in the last 168 hours.  No results found for this or any previous visit (from the past 240 hour(s)).   Studies: Ct Abdomen Pelvis Wo Contrast  Result Date: 08/27/2018 CLINICAL DATA:  Generalized abdominal and flank pain. EXAM: CT ABDOMEN AND PELVIS WITHOUT CONTRAST TECHNIQUE: Multidetector CT imaging of the abdomen and pelvis was performed following the standard protocol without IV contrast. COMPARISON:  04/14/2018 FINDINGS: Lower chest: Cardiomegaly with aortic atherosclerosis and dense left main and three-vessel coronary arteriosclerosis. No pericardial effusion or thickening. Dependent and subsegmental atelectasis at lung bases. Hepatobiliary: Biliary sludge and calculi noted within the gallbladder without wall thickening or pericholecystic fluid. Calcified granuloma in the right hepatic lobe adjacent to gallbladder. No biliary dilatation. Pancreas: Normal Spleen: Normal Adrenals/Urinary Tract: Hypodense left adrenal nodule measuring 13 x 15 mm compatible with a benign relatively stable since prior. Punctate nonobstructing bilateral calcifications noted within both kidneys some which may be vascular. Simple appearing cysts arising off the lower poles both kidneys, on left measuring 2.1 x 2.2 x 1.8 cm and on right, 2.8 x 2 x 2.7 cm. Smaller parapelvic left-sided renal cysts are also suspected as well as cortically based cysts. No hydroureteronephrosis. The urinary bladder is unremarkable for the degree distention. Stomach/Bowel: Physiologic distention of the stomach. Normal small bowel rotation without obstruction. Redemonstration of extensive colonic diverticulosis without acute diverticulitis. The appendix is not confidently identified but no pericecal inflammatory change is noted. Vascular/Lymphatic: Moderate marked aortoiliac atherosclerosis without aneurysm. No lymphadenopathy by CT size  criteria. Reproductive: Hysterectomy.  No adnexal mass. Other: No free air nor free fluid. Small right lateral hernia containing fat. Musculoskeletal: Stigmata posterior lumbar interbody fusion L1 through L5. Spondylosis included thoracolumbar spine otherwise noted without acute osseous appearing abnormality. IMPRESSION: 1. Cardiomegaly with left main and three-vessel coronary arteriosclerosis. Thoracic and abdominal aortic atherosclerosis without visualized aneurysm. 2. Uncomplicated cholelithiasis.  Right hepatic granuloma. 3. Extensive colonic diverticulosis without evidence of acute diverticulitis. No bowel inflammation or obstruction. 4. Intrarenal calculi without obstruction noted bilaterally. Bilateral renal cysts. Stable left adrenal nodule compatible with a benign adenoma. 5. Right posterolateral fat containing hernia as before. Electronically Signed   By: Ashley Royalty M.D.   On: 08/27/2018 15:21   Dg Chest 2 View  Result Date: 08/27/2018 CLINICAL DATA:  Chest pain and cough today. EXAM: CHEST - 2 VIEW COMPARISON:  08/20/2018 FINDINGS: There is opacity in the right lower lobe consistent with pneumonia or atelectasis. Lungs are otherwise clear. No pleural effusion or pneumothorax. Cardiac silhouette is mildly enlarged. Coronary artery stents are stable. No mediastinal or hilar masses. No evidence of adenopathy. Skeletal structures are intact. IMPRESSION: 1. Opacity in the right lower lobe consistent with pneumonia or atelectasis. No other evidence of acute cardiopulmonary disease. 2. Stable cardiomegaly Electronically Signed   By: Lajean Manes M.D.   On: 08/27/2018 13:33    Scheduled Meds: . arformoterol  15 mcg Nebulization BID  .  aspirin  81 mg Oral Daily  . budesonide  0.25 mg Nebulization BID  . carvedilol  6.25 mg Oral BID WC  . cholecalciferol  1,000 Units Oral Daily  . docusate sodium  100 mg Oral Q12H  . doxycycline  100 mg Oral Q12H  . enoxaparin (LOVENOX) injection  90 mg  Subcutaneous Q12H  . gabapentin  300 mg Oral Q6H  . ipratropium-albuterol  3 mL Nebulization Q6H  . isosorbide mononitrate  120 mg Oral Daily  . lubiprostone  24 mcg Oral BID WC  . magnesium citrate  1 Bottle Oral Once  . methylPREDNISolone (SOLU-MEDROL) injection  40 mg Intravenous Q6H  . polyethylene glycol  17 g Oral Daily   Continuous Infusions:  Principal Problem:   NSTEMI (non-ST elevated myocardial infarction) ([place]) Active Problems:   COPD exacerbation (H[facility])   Constipation   Chest pain   Tobacco abuse   COPD, group D, by GOLD 2017 classification ([place])   HTN (hypertension)   Abdominal pain   Acute kidney injury ([place])   Hyperlipemia    Time spent: 40 minutes    [facility]  If 7PM-7AM, please contact night-coverage at [email or web address], password [facility] 08/28/2018, 3:15 PM  LOS: 0 days

## 2018-08-28 NOTE — Progress Notes (Signed)
  Echocardiogram 2D Echocardiogram has been performed.  [name] 08/28/2018, 9:07 AM

## 2018-08-28 NOTE — Progress Notes (Signed)
Santiago Glad, NP aware of 10/10 pain ; NP will assess patient , no further orders at this time

## 2018-08-28 NOTE — Progress Notes (Signed)
ANTICOAGULATION CONSULT NOTE - Initial Consult  Pharmacy Consult for enoxaparin Indication: ACS / NSTEMI / CP  Allergies  Allergen Reactions  . Iohexol Other (See Comments)    Code:  HIVES, Desc:  PER ROBIN @ GI, PT IS ALLERGIC TO IVP DYE 08/28/10 RM  . Lipitor [Atorvastatin Calcium] Hives    Patient Measurements: Height: 5\' 4"  (162.6 cm) Weight: 200 lb 9.9 oz (91 kg) IBW/kg (Calculated) : 54.7   Vital Signs: Temp: 98 F (36.7 C) (10/11 0556) Temp Source: Oral (10/11 0556) BP: 166/79 (10/11 0556) Pulse Rate: 76 (10/11 0556)  Labs: Recent Labs    08/27/18 1224 08/27/18 1948 08/28/18 0312 08/28/18 0726  HGB 15.0  --  13.0  --   HCT 47.5*  --  42.2  --   PLT 205  --  195  --   CREATININE 1.12*  --  1.24*  --   TROPONINI  --  0.21* 0.15* 0.12*    Estimated Creatinine Clearance: 38.9 mL/min (A) (by C-G formula based on SCr of 1.24 mg/dL (H)).   Medical History: Past Medical History:  Diagnosis Date  . Arthritis    "in my spine" (04/15/2018)  . Blurred vision   . CAD (coronary artery disease)    Multiple percutaneous revascularization. Catheterization May 2013 left main normal, patent LAD stent, 90% circumflex stenosis, patent right coronary artery stents. Patient had a DES to the circumflex. The EF was well-preserved.  . Chronic lower back pain   . COPD (chronic obstructive pulmonary disease) ([place])   . Fatigue   . GERD (gastroesophageal reflux disease)   . Hard of hearing   . HTN (hypertension)   . Hx of radiation therapy    right nose 4500 cGy in 10 sessions over 5 weeks (2 treatments a week)  . Hyperlipemia   . On home oxygen therapy    "2L; 24/7" (08/27/2018)  . Pneumonia    "I've had a touch of it" (04/15/2018)  . Squamous cell carcinoma of nose    right   Assessment: 83 yo female presenting with SOA and chest discomfort. Patient declines cath and will be treated medically for NSTEMI. Pharmacy has been consulted for enoxaparin dosing for ACS/CP. CrCl  ~39, therefore qualifies for full dose.    Goal of Therapy:  Anti-Xa level 0.6-1 units/ml 4hrs after LMWH dose given Monitor platelets by anticoagulation protocol: Yes   Plan:  Enoxaparin 90mg  SQ q12h Monitor for bleeding and renal function  Kallen Delatorre A. Levada Dy, PharmD, [place] Pager: [phone] Please utilize Amion for appropriate phone number to reach the unit pharmacist ([place])   08/28/2018,10:39 AM

## 2018-08-28 NOTE — Progress Notes (Signed)
Pt going to echo at this time.

## 2018-08-28 NOTE — Progress Notes (Signed)
Pt still not able to have a BM at this time , Dr. Eliseo Squires aware and will place orders for citrate and lactulose

## 2018-08-29 LAB — CBC
HEMATOCRIT: 42.7 % (ref 36.0–46.0)
Hemoglobin: 13.4 g/dL (ref 12.0–15.0)
MCH: 29.3 pg (ref 26.0–34.0)
MCHC: 31.4 g/dL (ref 30.0–36.0)
MCV: 93.2 fL (ref 80.0–100.0)
Platelets: 207 10*3/uL (ref 150–400)
RBC: 4.58 MIL/uL (ref 3.87–5.11)
RDW: 11.8 % (ref 11.5–15.5)
WBC: 6 10*3/uL (ref 4.0–10.5)
nRBC: 0 % (ref 0.0–0.2)

## 2018-08-29 LAB — BASIC METABOLIC PANEL
ANION GAP: 7 (ref 5–15)
BUN: 19 mg/dL (ref 8–23)
CALCIUM: 9.3 mg/dL (ref 8.9–10.3)
CO2: 30 mmol/L (ref 22–32)
Chloride: 99 mmol/L (ref 98–111)
Creatinine, Ser: 1.1 mg/dL — ABNORMAL HIGH (ref 0.44–1.00)
GFR calc Af Amer: 53 mL/min — ABNORMAL LOW (ref >60)
GFR calc non Af Amer: 46 mL/min — ABNORMAL LOW (ref >60)
Glucose, Bld: 165 mg/dL — ABNORMAL HIGH (ref 70–99)
Potassium: 4.7 mmol/L (ref 3.5–5.1)
Sodium: 136 mmol/L (ref 135–145)

## 2018-08-29 MED ORDER — TICAGRELOR 90 MG PO TABS: 90.0000 mg | ORAL_TABLET | Freq: Two times a day (BID) | ORAL | Status: DC

## 2018-08-29 MED ORDER — PREDNISONE 20 MG PO TABS: 20.0000 mg | ORAL_TABLET | Freq: Every day | ORAL | Status: DC

## 2018-08-29 MED ORDER — PREDNISONE 20 MG PO TABS
20.0000 mg | ORAL_TABLET | Freq: Every day | ORAL | Status: AC
  Administered 2018-08-30 – 2018-09-01 (×3): 20 mg via ORAL
  Filled 2018-08-29 (×3): qty 1

## 2018-08-29 MED ORDER — TICAGRELOR 90 MG PO TABS
90.0000 mg | ORAL_TABLET | Freq: Two times a day (BID) | ORAL | Status: DC
  Administered 2018-08-30 – 2018-09-01 (×5): 90 mg via ORAL
  Filled 2018-08-29 (×5): qty 1

## 2018-08-29 MED ORDER — METOPROLOL TARTRATE 5 MG/5ML IV SOLN
5.0000 mg | INTRAVENOUS | Status: AC | PRN
  Administered 2018-08-29 – 2018-08-30 (×2): 5 mg via INTRAVENOUS
  Filled 2018-08-29 (×2): qty 5

## 2018-08-29 MED ORDER — DICYCLOMINE HCL 20 MG PO TABS
20.0000 mg | ORAL_TABLET | Freq: Three times a day (TID) | ORAL | Status: DC | PRN
  Administered 2018-08-29 – 2018-08-30 (×3): 20 mg via ORAL
  Filled 2018-08-29 (×3): qty 1

## 2018-08-29 MED ORDER — SENNOSIDES-DOCUSATE SODIUM 8.6-50 MG PO TABS
2.0000 | ORAL_TABLET | Freq: Two times a day (BID) | ORAL | Status: DC
  Administered 2018-08-30: 2 via ORAL
  Filled 2018-08-29 (×3): qty 2

## 2018-08-29 NOTE — Progress Notes (Signed)
Progress Note  Patient Name: [name] Date of Encounter: 08/29/2018  Primary Cardiologist: [facility]  Primary Electrophysiologist:  NA   Patient Profile     83 y.o. female  Admitted with SOB and seen 10/11 for CP and + Tn (.21)  in setting of known CAD and O2 dependent COPD  Pt declines cath    Subjective   Largely deaf which make history hard  C/o abd pain now which she attributes to her am pill C/o presyncope with BP 180  Inpatient Medications    Scheduled Meds: . arformoterol  15 mcg Nebulization BID  . aspirin  81 mg Oral Daily  . budesonide  0.25 mg Nebulization BID  . carvedilol  6.25 mg Oral BID WC  . cholecalciferol  1,000 Units Oral Daily  . docusate sodium  100 mg Oral Q12H  . doxycycline  100 mg Oral Q12H  . enoxaparin (LOVENOX) injection  90 mg Subcutaneous Q12H  . gabapentin  300 mg Oral Q6H  . ipratropium-albuterol  3 mL Nebulization Q6H  . isosorbide mononitrate  120 mg Oral Daily  . lubiprostone  24 mcg Oral BID WC  . methylPREDNISolone (SOLU-MEDROL) injection  40 mg Intravenous Q6H  . polyethylene glycol  17 g Oral Daily   Continuous Infusions:  PRN Meds: acetaminophen, amitriptyline, dicyclomine, fluticasone, guaiFENesin, lactulose, morphine injection, MUSCLE RUB, nitroGLYCERIN   Vital Signs    Vitals:   08/29/18 0304 08/29/18 0652 08/29/18 0849 08/29/18 0922  BP: (!) 161/79   (!) 181/89  Pulse: 71   87  Resp: 14     Temp:  97.6 F (36.4 C)    TempSrc:  Oral    SpO2: 95%  94%   Weight:      Height:        Intake/Output Summary (Last 24 hours) at 08/29/2018 1121 Last data filed at 08/29/2018 0843 Gross per 24 hour  Intake 240 ml  Output 900 ml  Net -660 ml   Filed Weights   08/27/18 1214 08/29/18 0147  Weight: 91 kg 90.7 kg    Telemetry    Sinus  - Personally Reviewed  ECG     nsr with acute changes  - Personally Reviewed  Physical Exam   GEN: No acute distress.   Cardiac: RRR, no  murmurs, rubs, or gallops.    Respiratory: Clear to auscultation bilaterally. GI: Soft, nontender, non-distended  MS: no edema; No deformity. Neuro:  Nonfocal  Psych: Normal affect  Skin Warm and dry   Labs    Chemistry Recent Labs  Lab 08/27/18 1224 08/28/18 0312 08/28/18 1500 08/29/18 0448  NA 140 141 135 136  K 2.8* 3.7 4.1 4.7  CL 99 101 100 99  CO2 27 31 29 30   GLUCOSE 106* 105* 142* 165*  BUN 15 20 24* 19  CREATININE 1.12* 1.24* 1.23* 1.10*  CALCIUM 9.8 8.9 8.9 9.3  PROT 6.6 5.9*  --   --   ALBUMIN 3.7 3.4*  --   --   AST 21 18  --   --   ALT 13 13  --   --   ALKPHOS 49 42  --   --   BILITOT 0.4 0.4  --   --   GFRNONAA 45* 40* 40* 46*  GFRAA 52* 46* 46* 53*  ANIONGAP 14 9 6 7      Hematology Recent Labs  Lab 08/27/18 1224 08/28/18 0312 08/29/18 0448  WBC 7.3 6.5 6.0  RBC 5.12* 4.46 4.58  HGB 15.0 13.0 13.4  HCT 47.5* 42.2 42.7  MCV 92.8 94.6 93.2  MCH 29.3 29.1 29.3  MCHC 31.6 30.8 31.4  RDW 11.7 11.9 11.8  PLT 205 195 207    Cardiac Enzymes Recent Labs  Lab 08/27/18 1948 08/28/18 0312 08/28/18 0726  TROPONINI 0.21* 0.15* 0.12*    Recent Labs  Lab 08/27/18 1259  TROPIPOC 0.09*     BNPNo results for input(s): BNP, PROBNP in the last 168 hours.   DDimer No results for input(s): DDIMER in the last 168 hours.   Radiology    Ct Abdomen Pelvis Wo Contrast  Result Date: 08/27/2018 CLINICAL DATA:  Generalized abdominal and flank pain. EXAM: CT ABDOMEN AND PELVIS WITHOUT CONTRAST TECHNIQUE: Multidetector CT imaging of the abdomen and pelvis was performed following the standard protocol without IV contrast. COMPARISON:  04/14/2018 FINDINGS: Lower chest: Cardiomegaly with aortic atherosclerosis and dense left main and three-vessel coronary arteriosclerosis. No pericardial effusion or thickening. Dependent and subsegmental atelectasis at lung bases. Hepatobiliary: Biliary sludge and calculi noted within the gallbladder without wall thickening or pericholecystic fluid.  Calcified granuloma in the right hepatic lobe adjacent to gallbladder. No biliary dilatation. Pancreas: Normal Spleen: Normal Adrenals/Urinary Tract: Hypodense left adrenal nodule measuring 13 x 15 mm compatible with a benign relatively stable since prior. Punctate nonobstructing bilateral calcifications noted within both kidneys some which may be vascular. Simple appearing cysts arising off the lower poles both kidneys, on left measuring 2.1 x 2.2 x 1.8 cm and on right, 2.8 x 2 x 2.7 cm. Smaller parapelvic left-sided renal cysts are also suspected as well as cortically based cysts. No hydroureteronephrosis. The urinary bladder is unremarkable for the degree distention. Stomach/Bowel: Physiologic distention of the stomach. Normal small bowel rotation without obstruction. Redemonstration of extensive colonic diverticulosis without acute diverticulitis. The appendix is not confidently identified but no pericecal inflammatory change is noted. Vascular/Lymphatic: Moderate marked aortoiliac atherosclerosis without aneurysm. No lymphadenopathy by CT size criteria. Reproductive: Hysterectomy.  No adnexal mass. Other: No free air nor free fluid. Small right lateral hernia containing fat. Musculoskeletal: Stigmata posterior lumbar interbody fusion L1 through L5. Spondylosis included thoracolumbar spine otherwise noted without acute osseous appearing abnormality. IMPRESSION: 1. Cardiomegaly with left main and three-vessel coronary arteriosclerosis. Thoracic and abdominal aortic atherosclerosis without visualized aneurysm. 2. Uncomplicated cholelithiasis.  Right hepatic granuloma. 3. Extensive colonic diverticulosis without evidence of acute diverticulitis. No bowel inflammation or obstruction. 4. Intrarenal calculi without obstruction noted bilaterally. Bilateral renal cysts. Stable left adrenal nodule compatible with a benign adenoma. 5. Right posterolateral fat containing hernia as before. Electronically Signed   By: Ashley Royalty M.D.   On: 08/27/2018 15:21   Dg Chest 2 View  Result Date: 08/27/2018 CLINICAL DATA:  Chest pain and cough today. EXAM: CHEST - 2 VIEW COMPARISON:  08/20/2018 FINDINGS: There is opacity in the right lower lobe consistent with pneumonia or atelectasis. Lungs are otherwise clear. No pleural effusion or pneumothorax. Cardiac silhouette is mildly enlarged. Coronary artery stents are stable. No mediastinal or hilar masses. No evidence of adenopathy. Skeletal structures are intact. IMPRESSION: 1. Opacity in the right lower lobe consistent with pneumonia or atelectasis. No other evidence of acute cardiopulmonary disease. 2. Stable cardiomegaly Electronically Signed   By: Lajean Manes M.D.   On: 08/27/2018 13:33    Cardiac Studies     Assessment & Plan    NSTEMI  CAD with prior STents  Pneumonia  Not sure why she in nauseated   Will check ECG  and then in nothing acute will defer to IM   Will begin ticagrelor in am  for NSTEMI med Rx  Continue heparin >= 48 hrs total z>> will stop tomorrow   ECG without acute changes   Signed, Virl Axe, MD  08/29/2018, 11:21 AM

## 2018-08-29 NOTE — Progress Notes (Signed)
Patient Demographics:    [name], is a 83 y.o. female, DOB - 04-25-1935, KGY:[id number]  Admit date - 08/27/2018   Admitting Physician Donne Hazel, MD  Outpatient Primary MD for the patient is Leonard Downing, MD  LOS - 1   No chief complaint on file.       Subjective:    [name] today has no fevers, no emesis,  No chest pain,  Sob noted, had Nausea and abdominal discomfort, had BMs  Assessment  & Plan :    Principal Problem:   NSTEMI (non-ST elevated myocardial infarction) ([place]) Active Problems:   Hyperlipemia   HTN (hypertension)   COPD exacerbation (HCC)   Abdominal pain   Constipation   Chest pain   Tobacco abuse   COPD, group D, by GOLD 2017 classification ([place])   Acute kidney injury ([place])   Acute exacerbation of chronic obstructive pulmonary disease (COPD) ([place])  Brief summary 83 y.o. female with medical history significant of CAD s/p prior stent placement, COPD, HTN, HLD, GERD, constipation admitted on 08/27/2018 with abdominal discomfort and right hip discomfort, she has also had SOB and CP with elevated troponin (.21)  in setting of known CAD and O2 dependent COPD   Plan:- 1)CP/NSTEMI--- history of CAD with prior stents , echo with preserved EF with no significant regional wall motion normalities, patient apparently intolerance to statins, patient declined cardiac cath, continue therapeutic Lovenox, Coreg 6.25 mg twice daily and aspirin 81 mg daily, Brilinta, Imdur 30 mg daily, cardiology consult appreciated, patient is currently chest pain-free  2)CAP--- with concerns about possible COPD exacerbation, continue doxycycline 100 mg, stop IV Solu-Medrol give low-dose prednisone 20 mg daily for 3 days given concerns about possible NSTEMI avoid high-dose steroids  3)And Pain/nausea and chronic constipation--- CT abdomen and pelvis with obstipation throughout transverse  colon and ascending colon, had multiple BMs after mag citrate, continue Amitiza and PRN bentyl  4) acute on chronic hypoxic respiratory failure--treat #1 #2 as above, continue oxygen supplementation  Disposition/Need for in-Hospital Stay- patient unable to be discharged at this time due to NSTEMI--- patient is at risk for further cardiac decompensation, currently requiring oxygen above at baseline, will also require titration of cardiac medications  Code Status : Full code   Disposition Plan  : TBD  Consults  : Cardiology   DVT Prophylaxis  : Therapeutic Lovenox -   Lab Results  Component Value Date   PLT 207 08/29/2018    Inpatient Medications  Scheduled Meds: . arformoterol  15 mcg Nebulization BID  . aspirin  81 mg Oral Daily  . budesonide  0.25 mg Nebulization BID  . carvedilol  6.25 mg Oral BID WC  . cholecalciferol  1,000 Units Oral Daily  . docusate sodium  100 mg Oral Q12H  . doxycycline  100 mg Oral Q12H  . enoxaparin (LOVENOX) injection  90 mg Subcutaneous Q12H  . gabapentin  300 mg Oral Q6H  . ipratropium-albuterol  3 mL Nebulization Q6H  . isosorbide mononitrate  120 mg Oral Daily  . lubiprostone  24 mcg Oral BID WC  . methylPREDNISolone (SOLU-MEDROL) injection  40 mg Intravenous Q6H  . polyethylene glycol  17 g Oral Daily  . [START ON 08/30/2018] ticagrelor  90 mg Oral BID   Continuous Infusions: PRN Meds:.acetaminophen, amitriptyline, dicyclomine, fluticasone, guaiFENesin, lactulose, morphine injection, MUSCLE RUB, nitroGLYCERIN    Anti-infectives (From admission, onward)   Start     Dose/Rate Route Frequency Ordered Stop   08/28/18 1515  doxycycline (VIBRA-TABS) tablet 100 mg     100 mg Oral Every 12 hours 08/28/18 1504     08/27/18 1645  cefTRIAXone (ROCEPHIN) 1 g in sodium chloride 0.9 % 100 mL IVPB  Status:  Discontinued     1 g 200 mL/hr over 30 Minutes Intravenous  Once 08/27/18 1637 08/27/18 1653   08/27/18 1645  azithromycin (ZITHROMAX) tablet  500 mg  Status:  Discontinued     500 mg Oral  Once 08/27/18 1637 08/27/18 1653        Objective:   Vitals:   08/29/18 0304 08/29/18 0652 08/29/18 0849 08/29/18 0922  BP: (!) 161/79   (!) 181/89  Pulse: 71   87  Resp: 14     Temp:  97.6 F (36.4 C)    TempSrc:  Oral    SpO2: 95%  94%   Weight:      Height:        Wt Readings from Last 3 Encounters:  08/29/18 90.7 kg  08/20/18 91.2 kg  04/15/18 89.5 kg     Intake/Output Summary (Last 24 hours) at 08/29/2018 1345 Last data filed at 08/29/2018 0843 Gross per 24 hour  Intake 240 ml  Output 900 ml  Net -660 ml     Physical Exam Patient is examined daily including today on 08/29/18 , exams remain the same as of yesterday except that has changed   Gen:- Awake Alert, in no acute distress HEENT:- [place].AT, No sclera icterus Ears-very hard of hearing Nose-nasal cannula at 3 to 4 L Neck-Supple Neck,No JVD,.  Lungs-diminished in bases with faint bibasilar rales CV- S1, S2 normal, regular Abd-  +ve B.Sounds, Abd Soft, No tenderness,    Extremity/Skin:-Good pulses, warm and dry Psych-affect is appropriate, oriented x3 Neuro-no new focal deficits, no tremors   Data Review:   Micro Results No results found for this or any previous visit (from the past 240 hour(s)).  Radiology Reports Ct Abdomen Pelvis Wo Contrast  Result Date: 08/27/2018 CLINICAL DATA:  Generalized abdominal and flank pain. EXAM: CT ABDOMEN AND PELVIS WITHOUT CONTRAST TECHNIQUE: Multidetector CT imaging of the abdomen and pelvis was performed following the standard protocol without IV contrast. COMPARISON:  04/14/2018 FINDINGS: Lower chest: Cardiomegaly with aortic atherosclerosis and dense left main and three-vessel coronary arteriosclerosis. No pericardial effusion or thickening. Dependent and subsegmental atelectasis at lung bases. Hepatobiliary: Biliary sludge and calculi noted within the gallbladder without wall thickening or pericholecystic fluid.  Calcified granuloma in the right hepatic lobe adjacent to gallbladder. No biliary dilatation. Pancreas: Normal Spleen: Normal Adrenals/Urinary Tract: Hypodense left adrenal nodule measuring 13 x 15 mm compatible with a benign relatively stable since prior. Punctate nonobstructing bilateral calcifications noted within both kidneys some which may be vascular. Simple appearing cysts arising off the lower poles both kidneys, on left measuring 2.1 x 2.2 x 1.8 cm and on right, 2.8 x 2 x 2.7 cm. Smaller parapelvic left-sided renal cysts are also suspected as well as cortically based cysts. No hydroureteronephrosis. The urinary bladder is unremarkable for the degree distention. Stomach/Bowel: Physiologic distention of the stomach. Normal small bowel rotation without obstruction. Redemonstration of extensive colonic diverticulosis without acute diverticulitis. The appendix is not confidently identified but no pericecal inflammatory change is  noted. Vascular/Lymphatic: Moderate marked aortoiliac atherosclerosis without aneurysm. No lymphadenopathy by CT size criteria. Reproductive: Hysterectomy.  No adnexal mass. Other: No free air nor free fluid. Small right lateral hernia containing fat. Musculoskeletal: Stigmata posterior lumbar interbody fusion L1 through L5. Spondylosis included thoracolumbar spine otherwise noted without acute osseous appearing abnormality. IMPRESSION: 1. Cardiomegaly with left main and three-vessel coronary arteriosclerosis. Thoracic and abdominal aortic atherosclerosis without visualized aneurysm. 2. Uncomplicated cholelithiasis.  Right hepatic granuloma. 3. Extensive colonic diverticulosis without evidence of acute diverticulitis. No bowel inflammation or obstruction. 4. Intrarenal calculi without obstruction noted bilaterally. Bilateral renal cysts. Stable left adrenal nodule compatible with a benign adenoma. 5. Right posterolateral fat containing hernia as before. Electronically Signed   By: Ashley Royalty M.D.   On: 08/27/2018 15:21   Dg Chest 2 View  Result Date: 08/27/2018 CLINICAL DATA:  Chest pain and cough today. EXAM: CHEST - 2 VIEW COMPARISON:  08/20/2018 FINDINGS: There is opacity in the right lower lobe consistent with pneumonia or atelectasis. Lungs are otherwise clear. No pleural effusion or pneumothorax. Cardiac silhouette is mildly enlarged. Coronary artery stents are stable. No mediastinal or hilar masses. No evidence of adenopathy. Skeletal structures are intact. IMPRESSION: 1. Opacity in the right lower lobe consistent with pneumonia or atelectasis. No other evidence of acute cardiopulmonary disease. 2. Stable cardiomegaly Electronically Signed   By: Lajean Manes M.D.   On: 08/27/2018 13:33   Dg Chest 2 View  Result Date: 08/20/2018 CLINICAL DATA:  Shortness of breath EXAM: CHEST - 2 VIEW COMPARISON:  04/14/2018, 02/17/2018 FINDINGS: Cardiomegaly without pleural effusion. Aortic atherosclerosis. Subsegmental atelectasis at the left base. No pneumothorax. Mild chronic bronchitic changes. Degenerative and postsurgical changes of the spine. IMPRESSION: 1. No focal pulmonary infiltrate 2. Cardiomegaly 3. Subsegmental atelectasis left lung base. Mild diffuse chronic appearing bronchitic changes Electronically Signed   By: Donavan Foil M.D.   On: 08/20/2018 17:59     CBC Recent Labs  Lab 08/27/18 1224 08/28/18 0312 08/29/18 0448  WBC 7.3 6.5 6.0  HGB 15.0 13.0 13.4  HCT 47.5* 42.2 42.7  PLT 205 195 207  MCV 92.8 94.6 93.2  MCH 29.3 29.1 29.3  MCHC 31.6 30.8 31.4  RDW 11.7 11.9 11.8    Chemistries  Recent Labs  Lab 08/27/18 1224 08/28/18 0312 08/28/18 1500 08/29/18 0448  NA 140 141 135 136  K 2.8* 3.7 4.1 4.7  CL 99 101 100 99  CO2 27 31 29 30   GLUCOSE 106* 105* 142* 165*  BUN 15 20 24* 19  CREATININE 1.12* 1.24* 1.23* 1.10*  CALCIUM 9.8 8.9 8.9 9.3  AST 21 18  --   --   ALT 13 13  --   --   ALKPHOS 49 42  --   --   BILITOT 0.4 0.4  --   --     ------------------------------------------------------------------------------------------------------------------ No results for input(s): CHOL, HDL, LDLCALC, TRIG, CHOLHDL, LDLDIRECT in the last 72 hours.  Lab Results  Component Value Date   HGBA1C 6.5 (H) 10/06/2017   ------------------------------------------------------------------------------------------------------------------ No results for input(s): TSH, T4TOTAL, T3FREE, THYROIDAB in the last 72 hours.  Invalid input(s): FREET3 ------------------------------------------------------------------------------------------------------------------ No results for input(s): VITAMINB12, FOLATE, FERRITIN, TIBC, IRON, RETICCTPCT in the last 72 hours.  Coagulation profile No results for input(s): INR, PROTIME in the last 168 hours.  No results for input(s): DDIMER in the last 72 hours.  Cardiac Enzymes Recent Labs  Lab 08/27/18 1948 08/28/18 0312 08/28/18 0726  TROPONINI 0.21* 0.15* 0.12*   ------------------------------------------------------------------------------------------------------------------  Component Value Date/Time   BNP 82.7 09/09/2016 [place] M.D on 08/29/2018 at 1:45 PM  Pager---[phone] Go to [email or web address] - password [email or web address] for contact info  [facility] - Office  [phone]

## 2018-08-30 LAB — CBC
HCT: 43.2 % (ref 36.0–46.0)
Hemoglobin: 13.6 g/dL (ref 12.0–15.0)
MCH: 29.2 pg (ref 26.0–34.0)
MCHC: 31.5 g/dL (ref 30.0–36.0)
MCV: 92.9 fL (ref 80.0–100.0)
NRBC: 0 % (ref 0.0–0.2)
PLATELETS: 220 10*3/uL (ref 150–400)
RBC: 4.65 MIL/uL (ref 3.87–5.11)
RDW: 11.9 % (ref 11.5–15.5)
WBC: 11.3 10*3/uL — ABNORMAL HIGH (ref 4.0–10.5)

## 2018-08-30 LAB — BASIC METABOLIC PANEL
ANION GAP: 8 (ref 5–15)
BUN: 21 mg/dL (ref 8–23)
CALCIUM: 9.5 mg/dL (ref 8.9–10.3)
CO2: 29 mmol/L (ref 22–32)
Chloride: 101 mmol/L (ref 98–111)
Creatinine, Ser: 1 mg/dL (ref 0.44–1.00)
GFR calc Af Amer: 60 mL/min — ABNORMAL LOW (ref >60)
GFR, EST NON AFRICAN AMERICAN: 51 mL/min — AB (ref >60)
GLUCOSE: 109 mg/dL — AB (ref 70–99)
Potassium: 4.4 mmol/L (ref 3.5–5.1)
Sodium: 138 mmol/L (ref 135–145)

## 2018-08-30 LAB — BRAIN NATRIURETIC PEPTIDE: B Natriuretic Peptide: 254.1 pg/mL — ABNORMAL HIGH (ref 0.0–100.0)

## 2018-08-30 LAB — GLUCOSE, CAPILLARY: Glucose-Capillary: 156 mg/dL — ABNORMAL HIGH (ref 70–99)

## 2018-08-30 MED ORDER — ALPRAZOLAM 0.25 MG PO TABS
0.2500 mg | ORAL_TABLET | Freq: Two times a day (BID) | ORAL | Status: DC | PRN
  Administered 2018-09-01: 0.25 mg via ORAL
  Filled 2018-08-30: qty 1

## 2018-08-30 MED ORDER — AMLODIPINE BESYLATE 5 MG PO TABS
5.0000 mg | ORAL_TABLET | Freq: Every day | ORAL | Status: DC
  Administered 2018-08-30 – 2018-08-31 (×2): 5 mg via ORAL
  Filled 2018-08-30 (×2): qty 1

## 2018-08-30 MED ORDER — ALPRAZOLAM 0.25 MG PO TABS
0.2500 mg | ORAL_TABLET | Freq: Once | ORAL | Status: AC
  Administered 2018-08-30: 0.25 mg via ORAL
  Filled 2018-08-30: qty 1

## 2018-08-30 MED ORDER — BUSPIRONE HCL 5 MG PO TABS
5.0000 mg | ORAL_TABLET | Freq: Three times a day (TID) | ORAL | Status: DC
  Administered 2018-08-30 – 2018-09-01 (×6): 5 mg via ORAL
  Filled 2018-08-30 (×6): qty 1

## 2018-08-30 MED ORDER — FUROSEMIDE 10 MG/ML IJ SOLN
40.0000 mg | Freq: Every day | INTRAMUSCULAR | Status: DC
  Administered 2018-08-31 – 2018-09-01 (×2): 40 mg via INTRAVENOUS
  Filled 2018-08-30 (×2): qty 4

## 2018-08-30 MED ORDER — HYDRALAZINE HCL 20 MG/ML IJ SOLN
5.0000 mg | Freq: Three times a day (TID) | INTRAMUSCULAR | Status: DC | PRN
  Administered 2018-08-30: 5 mg via INTRAVENOUS
  Filled 2018-08-30: qty 1

## 2018-08-30 MED ORDER — IPRATROPIUM-ALBUTEROL 0.5-2.5 (3) MG/3ML IN SOLN
3.0000 mL | Freq: Four times a day (QID) | RESPIRATORY_TRACT | Status: DC
  Administered 2018-08-30 – 2018-08-31 (×7): 3 mL via RESPIRATORY_TRACT
  Filled 2018-08-30 (×7): qty 3

## 2018-08-30 MED ORDER — FUROSEMIDE 10 MG/ML IJ SOLN
40.0000 mg | Freq: Two times a day (BID) | INTRAMUSCULAR | Status: DC
  Administered 2018-08-30: 40 mg via INTRAVENOUS
  Filled 2018-08-30: qty 4

## 2018-08-30 MED ORDER — ENOXAPARIN SODIUM 40 MG/0.4ML ~~LOC~~ SOLN
40.0000 mg | SUBCUTANEOUS | Status: DC
  Administered 2018-08-31 – 2018-09-01 (×2): 40 mg via SUBCUTANEOUS
  Filled 2018-08-30 (×2): qty 0.4

## 2018-08-30 NOTE — Progress Notes (Signed)
Patient's B/P elevated in the 180's/70's, manual B/P was 184/86, called for something to help bring the B/P down. Py is asymptomatic at this time but is experiencing some SOB since getting back to bed from [facility], will continue to monitor and await any further orders.

## 2018-08-30 NOTE — Progress Notes (Signed)
Patient Demographics:    [name], is a 83 y.o. female, DOB - December 26, 1934, [id number]  Admit date - 08/27/2018   Admitting Physician Donne Hazel, MD  Outpatient Primary MD for the patient is Leonard Downing, MD  LOS - 2   No chief complaint on file.       Subjective:    [name] today has  No chest pain, phone numbers at bedside, patient had additional BMs, she has had belly feels better, had a panic/anxiety attack, as per family this is not unusual for patient get panic attacks  Assessment  & Plan :    Principal Problem:   NSTEMI (non-ST elevated myocardial infarction) [facility]) Active Problems:   Hyperlipemia   HTN (hypertension)   COPD exacerbation (HCC)   Abdominal pain   Constipation   Chest pain   Tobacco abuse   COPD, group D, by GOLD 2017 classification ([place])   Acute kidney injury ([place])   Acute exacerbation of chronic obstructive pulmonary disease (COPD) ([place])  Brief summary 83 y.o. female with medical history significant of CAD s/p prior stent placement, COPD, HTN, HLD, GERD, constipation admitted on 08/27/2018 with abdominal discomfort and right hip discomfort, she has also had SOB and CP with elevated troponin (.21)  in setting of known CAD and O2 dependent COPD   Plan:- 1)CP/NSTEMI--- history of CAD with prior stents , echo with preserved EF with no significant regional wall motion normalities, patient apparently intolerance to statins, patient declined cardiac cath, completed therapeutic Lovenox, may switch to prophylactic Lovenox, continue Coreg 6.25 mg twice daily and aspirin 81 mg daily, Cardiology also advises Brilinta, Imdur 120 mg daily, cardiology consult appreciated, patient is currently chest pain-free, IV Lasix as ordered by cardiology team due to concerns about some volume overload/CHF  2)CAP--- with concerns about possible COPD exacerbation, continue  doxycycline 100 mg, stop IV Solu-Medrol, c/n prednisone 20 mg daily for 3 days given concerns about possible NSTEMI avoid high-dose steroids  3)ABd Pain/Nausea and chronic constipation--- CT abdomen and pelvis with obstipation throughout transverse colon and ascending colon, had multiple BMs after mag citrate, continue Amitiza and PRN bentyl  4) acute on chronic hypoxic respiratory failure--treat #1 #2 as above, continue oxygen supplementation  5) anxiety/panic attack/disorder--- Buspar 5 mg 3 times daily, Xanax 0.25 mg every 12 as needed  Disposition/Need for in-Hospital Stay- patient unable to be discharged at this time due to NSTEMI--- patient is at risk for further cardiac decompensation, currently requiring oxygen above at baseline, will also require titration of cardiac medications, cardiology advises IV diuresis  Code Status : Full code   Disposition Plan  : TBD  Consults  : Cardiology   DVT Prophylaxis  : Therapeutic Lovenox -   Lab Results  Component Value Date   PLT 220 08/30/2018    Inpatient Medications  Scheduled Meds: . amLODipine  5 mg Oral Daily  . arformoterol  15 mcg Nebulization BID  . aspirin  81 mg Oral Daily  . budesonide  0.25 mg Nebulization BID  . busPIRone  5 mg Oral TID  . carvedilol  6.25 mg Oral BID WC  . cholecalciferol  1,000 Units Oral Daily  . doxycycline  100 mg Oral Q12H  . [START ON  08/31/2018] enoxaparin (LOVENOX) injection  40 mg Subcutaneous Q24H  . [START ON 08/31/2018] furosemide  40 mg Intravenous Daily  . gabapentin  300 mg Oral Q6H  . ipratropium-albuterol  3 mL Nebulization Q6H  . isosorbide mononitrate  120 mg Oral Daily  . lubiprostone  24 mcg Oral BID WC  . polyethylene glycol  17 g Oral Daily  . predniSONE  20 mg Oral Q breakfast  . senna-docusate  2 tablet Oral BID  . ticagrelor  90 mg Oral BID   Continuous Infusions: PRN Meds:.acetaminophen, ALPRAZolam, amitriptyline, dicyclomine, fluticasone, guaiFENesin, hydrALAZINE,  lactulose, morphine injection, MUSCLE RUB, nitroGLYCERIN    Anti-infectives (From admission, onward)   Start     Dose/Rate Route Frequency Ordered Stop   08/28/18 1515  doxycycline (VIBRA-TABS) tablet 100 mg     100 mg Oral Every 12 hours 08/28/18 1504     08/27/18 1645  cefTRIAXone (ROCEPHIN) 1 g in sodium chloride 0.9 % 100 mL IVPB  Status:  Discontinued     1 g 200 mL/hr over 30 Minutes Intravenous  Once 08/27/18 1637 08/27/18 1653   08/27/18 1645  azithromycin (ZITHROMAX) tablet 500 mg  Status:  Discontinued     500 mg Oral  Once 08/27/18 1637 08/27/18 1653        Objective:   Vitals:   08/30/18 0903 08/30/18 0904 08/30/18 1247 08/30/18 1434  BP:   (!) 177/93 (!) 142/87  Pulse:   66 66  Resp:   (!) 26   Temp:    (!) 97.5 F (36.4 C)  TempSrc:    Oral  SpO2: 96% 96% 93% 97%  Weight:      Height:        Wt Readings from Last 3 Encounters:  08/30/18 90.4 kg  08/20/18 91.2 kg  04/15/18 89.5 kg     Intake/Output Summary (Last 24 hours) at 08/30/2018 1443 Last data filed at 08/30/2018 1200 Gross per 24 hour  Intake 120 ml  Output 1650 ml  Net -1530 ml     Physical Exam Patient is examined daily including today on 08/30/18 , exams remain the same as of yesterday except that has changed   Gen:- Awake Alert, in no acute distress HEENT:- [place].AT, No sclera icterus Ears-very hard of hearing Nose-nasal cannula at 3 to 4 L Neck-Supple Neck, +ve JVD,.  Lungs-diminished in bases with faint bibasilar rales,  CV- S1, S2 normal, regular Abd-  +ve B.Sounds, Abd Soft, No tenderness,    Extremity/Skin:-Good pulses, warm and dry Psych-affect is anxious, oriented x3 Neuro-no new focal deficits, no tremors   Data Review:   Micro Results No results found for this or any previous visit (from the past 240 hour(s)).  Radiology Reports Ct Abdomen Pelvis Wo Contrast  Result Date: 08/27/2018 CLINICAL DATA:  Generalized abdominal and flank pain. EXAM: CT ABDOMEN AND PELVIS  WITHOUT CONTRAST TECHNIQUE: Multidetector CT imaging of the abdomen and pelvis was performed following the standard protocol without IV contrast. COMPARISON:  04/14/2018 FINDINGS: Lower chest: Cardiomegaly with aortic atherosclerosis and dense left main and three-vessel coronary arteriosclerosis. No pericardial effusion or thickening. Dependent and subsegmental atelectasis at lung bases. Hepatobiliary: Biliary sludge and calculi noted within the gallbladder without wall thickening or pericholecystic fluid. Calcified granuloma in the right hepatic lobe adjacent to gallbladder. No biliary dilatation. Pancreas: Normal Spleen: Normal Adrenals/Urinary Tract: Hypodense left adrenal nodule measuring 13 x 15 mm compatible with a benign relatively stable since prior. Punctate nonobstructing bilateral calcifications noted within both kidneys  some which may be vascular. Simple appearing cysts arising off the lower poles both kidneys, on left measuring 2.1 x 2.2 x 1.8 cm and on right, 2.8 x 2 x 2.7 cm. Smaller parapelvic left-sided renal cysts are also suspected as well as cortically based cysts. No hydroureteronephrosis. The urinary bladder is unremarkable for the degree distention. Stomach/Bowel: Physiologic distention of the stomach. Normal small bowel rotation without obstruction. Redemonstration of extensive colonic diverticulosis without acute diverticulitis. The appendix is not confidently identified but no pericecal inflammatory change is noted. Vascular/Lymphatic: Moderate marked aortoiliac atherosclerosis without aneurysm. No lymphadenopathy by CT size criteria. Reproductive: Hysterectomy.  No adnexal mass. Other: No free air nor free fluid. Small right lateral hernia containing fat. Musculoskeletal: Stigmata posterior lumbar interbody fusion L1 through L5. Spondylosis included thoracolumbar spine otherwise noted without acute osseous appearing abnormality. IMPRESSION: 1. Cardiomegaly with left main and three-vessel  coronary arteriosclerosis. Thoracic and abdominal aortic atherosclerosis without visualized aneurysm. 2. Uncomplicated cholelithiasis.  Right hepatic granuloma. 3. Extensive colonic diverticulosis without evidence of acute diverticulitis. No bowel inflammation or obstruction. 4. Intrarenal calculi without obstruction noted bilaterally. Bilateral renal cysts. Stable left adrenal nodule compatible with a benign adenoma. 5. Right posterolateral fat containing hernia as before. Electronically Signed   By: Ashley Royalty M.D.   On: 08/27/2018 15:21   Dg Chest 2 View  Result Date: 08/27/2018 CLINICAL DATA:  Chest pain and cough today. EXAM: CHEST - 2 VIEW COMPARISON:  08/20/2018 FINDINGS: There is opacity in the right lower lobe consistent with pneumonia or atelectasis. Lungs are otherwise clear. No pleural effusion or pneumothorax. Cardiac silhouette is mildly enlarged. Coronary artery stents are stable. No mediastinal or hilar masses. No evidence of adenopathy. Skeletal structures are intact. IMPRESSION: 1. Opacity in the right lower lobe consistent with pneumonia or atelectasis. No other evidence of acute cardiopulmonary disease. 2. Stable cardiomegaly Electronically Signed   By: Lajean Manes M.D.   On: 08/27/2018 13:33   Dg Chest 2 View  Result Date: 08/20/2018 CLINICAL DATA:  Shortness of breath EXAM: CHEST - 2 VIEW COMPARISON:  04/14/2018, 02/17/2018 FINDINGS: Cardiomegaly without pleural effusion. Aortic atherosclerosis. Subsegmental atelectasis at the left base. No pneumothorax. Mild chronic bronchitic changes. Degenerative and postsurgical changes of the spine. IMPRESSION: 1. No focal pulmonary infiltrate 2. Cardiomegaly 3. Subsegmental atelectasis left lung base. Mild diffuse chronic appearing bronchitic changes Electronically Signed   By: Donavan Foil M.D.   On: 08/20/2018 17:59     CBC Recent Labs  Lab 08/27/18 1224 08/28/18 0312 08/29/18 0448 08/30/18 0348  WBC 7.3 6.5 6.0 11.3*  HGB 15.0  13.0 13.4 13.6  HCT 47.5* 42.2 42.7 43.2  PLT 205 195 207 220  MCV 92.8 94.6 93.2 92.9  MCH 29.3 29.1 29.3 29.2  MCHC 31.6 30.8 31.4 31.5  RDW 11.7 11.9 11.8 11.9    Chemistries  Recent Labs  Lab 08/27/18 1224 08/28/18 0312 08/28/18 1500 08/29/18 0448 08/30/18 0348  NA 140 141 135 136 138  K 2.8* 3.7 4.1 4.7 4.4  CL 99 101 100 99 101  CO2 27 31 29 30 29   GLUCOSE 106* 105* 142* 165* 109*  BUN 15 20 24* 19 21  CREATININE 1.12* 1.24* 1.23* 1.10* 1.00  CALCIUM 9.8 8.9 8.9 9.3 9.5  AST 21 18  --   --   --   ALT 13 13  --   --   --   ALKPHOS 49 42  --   --   --   BILITOT  0.4 0.4  --   --   --    ------------------------------------------------------------------------------------------------------------------ No results for input(s): CHOL, HDL, LDLCALC, TRIG, CHOLHDL, LDLDIRECT in the last 72 hours.  Lab Results  Component Value Date   HGBA1C 6.5 (H) 10/06/2017   ------------------------------------------------------------------------------------------------------------------ No results for input(s): TSH, T4TOTAL, T3FREE, THYROIDAB in the last 72 hours.  Invalid input(s): FREET3 ------------------------------------------------------------------------------------------------------------------ No results for input(s): VITAMINB12, FOLATE, FERRITIN, TIBC, IRON, RETICCTPCT in the last 72 hours.  Coagulation profile No results for input(s): INR, PROTIME in the last 168 hours.  No results for input(s): DDIMER in the last 72 hours.  Cardiac Enzymes Recent Labs  Lab 08/27/18 1948 08/28/18 0312 08/28/18 0726  TROPONINI 0.21* 0.15* 0.12*   ------------------------------------------------------------------------------------------------------------------    Component Value Date/Time   BNP 254.1 (H) 08/30/2018 0348   BNP 82.7 09/09/2016 1236     Collyns Mcquigg M.D on 08/30/2018 at 2:43 PM  Pager---[phone] Go to [email or web address] - password [email or web address] for contact  info  [facility] - Office  [phone]

## 2018-08-30 NOTE — Progress Notes (Signed)
Orders were received for 5 mg IV Metoprolol for SBP > 170, will give slow IVP and continue to monitor VS, no other changes noted.

## 2018-08-30 NOTE — Progress Notes (Signed)
Patient anxious, says "im going to die". Sats 97% on 2L patient with dyspnea on exertion. MD informed and rounded on patient. Morphine prn dose given. One time xanax dose given per order. Family visiting. Will continue to monitor

## 2018-08-30 NOTE — Progress Notes (Addendum)
Progress Note  Patient Name: [name] Date of Encounter: 08/30/2018  Primary Cardiologist: [facility]  Primary Electrophysiologist:  NA   Patient Profile     83 y.o. female  Admitted with SOB and seen 10/11 for CP and + Tn (.21)  in setting of known CAD and O2 dependent COPD  Pt declines cath    Subjective   Feels like she is going to die Hurts all over Some chest discomfort but also nausea   Inpatient Medications    Scheduled Meds: . arformoterol  15 mcg Nebulization BID  . aspirin  81 mg Oral Daily  . budesonide  0.25 mg Nebulization BID  . carvedilol  6.25 mg Oral BID WC  . cholecalciferol  1,000 Units Oral Daily  . doxycycline  100 mg Oral Q12H  . [START ON 08/31/2018] enoxaparin (LOVENOX) injection  40 mg Subcutaneous Q24H  . gabapentin  300 mg Oral Q6H  . ipratropium-albuterol  3 mL Nebulization Q6H  . isosorbide mononitrate  120 mg Oral Daily  . lubiprostone  24 mcg Oral BID WC  . polyethylene glycol  17 g Oral Daily  . predniSONE  20 mg Oral Q breakfast  . senna-docusate  2 tablet Oral BID  . ticagrelor  90 mg Oral BID   Continuous Infusions:  PRN Meds: acetaminophen, amitriptyline, dicyclomine, fluticasone, guaiFENesin, hydrALAZINE, lactulose, morphine injection, MUSCLE RUB, nitroGLYCERIN   Vital Signs    Vitals:   08/30/18 0553 08/30/18 0750 08/30/18 0903 08/30/18 0904  BP: (!) 188/83 (!) 168/113    Pulse: 68 65    Resp: (!) 24 15    Temp: 97.9 F (36.6 C)     TempSrc: Oral     SpO2: 94% 96% 96% 96%  Weight: 90.4 kg     Height:        Intake/Output Summary (Last 24 hours) at 08/30/2018 1214 Last data filed at 08/30/2018 1200 Gross per 24 hour  Intake 120 ml  Output 1650 ml  Net -1530 ml   Filed Weights   08/27/18 1214 08/29/18 0147 08/30/18 0553  Weight: 91 kg 90.7 kg 90.4 kg    Telemetry    Sinus  - Personally Reviewed  ECG     nsr with acute changes  - Personally Reviewed  Physical Exam  Well developed and nourished in mod  distress and uncomfortable HENT normal Neck supple   Wheezing and tachynea  Regular rate and rhythm, no murmurs or gallops Abd-soft with active BS No Clubbing cyanosis tr edema Skin-warm and dry A & Oriented  Grossly normal sensory and motor function    Labs    Chemistry Recent Labs  Lab 08/27/18 1224 08/28/18 0312 08/28/18 1500 08/29/18 0448 08/30/18 0348  NA 140 141 135 136 138  K 2.8* 3.7 4.1 4.7 4.4  CL 99 101 100 99 101  CO2 27 31 29 30 29   GLUCOSE 106* 105* 142* 165* 109*  BUN 15 20 24* 19 21  CREATININE 1.12* 1.24* 1.23* 1.10* 1.00  CALCIUM 9.8 8.9 8.9 9.3 9.5  PROT 6.6 5.9*  --   --   --   ALBUMIN 3.7 3.4*  --   --   --   AST 21 18  --   --   --   ALT 13 13  --   --   --   ALKPHOS 49 42  --   --   --   BILITOT 0.4 0.4  --   --   --  GFRNONAA 45* 40* 40* 46* 51*  GFRAA 52* 46* 46* 53* 60*  ANIONGAP 14 9 6 7 8      Hematology Recent Labs  Lab 08/28/18 0312 08/29/18 0448 08/30/18 0348  WBC 6.5 6.0 11.3*  RBC 4.46 4.58 4.65  HGB 13.0 13.4 13.6  HCT 42.2 42.7 43.2  MCV 94.6 93.2 92.9  MCH 29.1 29.3 29.2  MCHC 30.8 31.4 31.5  RDW 11.9 11.8 11.9  PLT 195 207 220    Cardiac Enzymes Recent Labs  Lab 08/27/18 1948 08/28/18 0312 08/28/18 0726  TROPONINI 0.21* 0.15* 0.12*    Recent Labs  Lab 08/27/18 1259  TROPIPOC 0.09*     BNPNo results for input(s): BNP, PROBNP in the last 168 hours.   DDimer No results for input(s): DDIMER in the last 168 hours.   Radiology    Chest Xray personally reviewed  >> inflitrate   Cardiac Studies     Assessment & Plan    NSTEMI  CAD with prior STents  Pneumonia  Hypertension   Not sure why she in nauseated   Will check ECG and then in nothing acute will defer to IM   Begin ticagelor for medical therapy for NSTEMI  Stop heparin  Will add amlodipine  for BP-- suspect will need more  On lots of meds for COPD,  May benefit from pulmonaryinput  Check BNP for ? CHF  Will give IV lasix for its  potential contribution       Signed, Virl Axe, MD  08/30/2018, 12:14 PM

## 2018-08-30 NOTE — Progress Notes (Signed)
Patient's B/P remains elevated 182/77 at 0350. Pt was given PRN metoprolol IV 5mg  at 0249. Previous order has been discontinued. Pt is asymptomatic and resting. Will continue to monitor.

## 2018-08-31 LAB — CBC
HEMATOCRIT: 43.9 % (ref 36.0–46.0)
Hemoglobin: 13.6 g/dL (ref 12.0–15.0)
MCH: 28.5 pg (ref 26.0–34.0)
MCHC: 31 g/dL (ref 30.0–36.0)
MCV: 92 fL (ref 80.0–100.0)
NRBC: 0 % (ref 0.0–0.2)
PLATELETS: 224 10*3/uL (ref 150–400)
RBC: 4.77 MIL/uL (ref 3.87–5.11)
RDW: 11.9 % (ref 11.5–15.5)
WBC: 8.5 10*3/uL (ref 4.0–10.5)

## 2018-08-31 LAB — BASIC METABOLIC PANEL
Anion gap: 10 (ref 5–15)
BUN: 27 mg/dL — ABNORMAL HIGH (ref 8–23)
CALCIUM: 9.7 mg/dL (ref 8.9–10.3)
CO2: 29 mmol/L (ref 22–32)
CREATININE: 1.25 mg/dL — AB (ref 0.44–1.00)
Chloride: 99 mmol/L (ref 98–111)
GFR, EST AFRICAN AMERICAN: 45 mL/min — AB (ref >60)
GFR, EST NON AFRICAN AMERICAN: 39 mL/min — AB (ref >60)
GLUCOSE: 108 mg/dL — AB (ref 70–99)
Potassium: 4.5 mmol/L (ref 3.5–5.1)
Sodium: 138 mmol/L (ref 135–145)

## 2018-08-31 MED ORDER — AMLODIPINE BESYLATE 5 MG PO TABS
5.0000 mg | ORAL_TABLET | Freq: Two times a day (BID) | ORAL | Status: DC
  Administered 2018-08-31 – 2018-09-01 (×2): 5 mg via ORAL
  Filled 2018-08-31 (×2): qty 1

## 2018-08-31 NOTE — Evaluation (Signed)
Physical Therapy Evaluation Patient Details Name: [name] MRN: [id number] DOB: [birth year] Today's Date: 08/31/2018   History of Present Illness  Patient is a 83 y/o female who presents with chest pain, abdominal pain and right hip pain. CXR-infiltrates in right lower lobe consistent with PNA vs atelectasis. Being treated for NSTEMI. PMH includes CAD s/p stent, HTN, HLD, COPD, depression.  Clinical Impression  Patient presents with generalized weakness, dyspnea on exertion, decreased activity tolerance, impaired balance and impaired mobility s/p above. Pt anxious with all mobility. Reports being Mod I PTA using RW. Lives with sister who can assist her at home per report. Tolerated standing bouts and short distance ambulation with min guard assist for safety. Sp02 dropped to 86% on 02. Education re: pursed lip breathing, energy conservation and relaxation techniques to ease anxiety and breath. Pt adamant about going home and has support so recommend HHPT to improve overall mobility, endurance and safety. Will follow acutely to maximize independence and mobility prior to return home.    Follow Up Recommendations Home health PT;Supervision for mobility/OOB    Equipment Recommendations  None recommended by PT    Recommendations for Other Services OT consult     Precautions / Restrictions Precautions Precautions: Fall Precaution Comments: anxious Restrictions Weight Bearing Restrictions: No      Mobility  Bed Mobility               General bed mobility comments: Sitting on BSC upon PT arrival.  Transfers Overall transfer level: Needs assistance Equipment used: Rolling walker (2 wheeled) Transfers: Sit to/from [facility] Sit to Stand: Min guard Stand pivot transfers: Min assist       General transfer comment: Min guard for safety. Multiple attempts to stand from [facility] and EOB. SPT BSC to EOB with Min A. Cues for hand  placement.  Ambulation/Gait Ambulation/Gait assistance: Min guard Gait Distance (Feet): 20 Feet Assistive device: Rolling walker (2 wheeled) Gait Pattern/deviations: Step-through pattern;Decreased stride length;Trunk flexed Gait velocity: decreased   General Gait Details: Slow, mildly unsteady gait with assist for balance/line management. 3/4 DOE. Sp02 dropped to 86% during mobility.   Stairs            Wheelchair Mobility    Modified Rankin (Stroke Patients Only)       Balance Overall balance assessment: Needs assistance Sitting-balance support: Feet supported;Single extremity supported Sitting balance-Leahy Scale: Fair Sitting balance - Comments: Able to reach down to foot placed on RW bar and adjust sock without LOB.   Standing balance support: During functional activity;Bilateral upper extremity supported Standing balance-Leahy Scale: Poor Standing balance comment: Requires BUE support and external assist due to tremors.                              Pertinent Vitals/Pain Pain Assessment: Faces Faces Pain Scale: Hurts a little bit Pain Location: chest and right arm, "Oh I dont know" Pain Descriptors / Indicators: Sore Pain Intervention(s): Monitored during session;Repositioned    Home Living Family/patient expects to be discharged to:: Private residence Living Arrangements: Other relatives(sister) Available Help at Discharge: Available PRN/intermittently;Family Type of Home: Mobile home Home Access: Stairs to enter Entrance Stairs-Rails: Right;Left;Can reach both Entrance Stairs-Number of Steps: 5 Home Layout: One level Home Equipment: Walker - 2 wheels;Walker - 4 wheels;Bedside commode;Shower seat;Adaptive equipment;Hand held shower head Additional Comments: pt lives with sister    Prior Function Level of Independence: Independent with assistive device(s)  Comments: on 2L O2 at home     Hand Dominance   Dominant Hand: Right     Extremity/Trunk Assessment   Upper Extremity Assessment Upper Extremity Assessment: Defer to OT evaluation(Tremulous BUEs- baseline)    Lower Extremity Assessment Lower Extremity Assessment: Generalized weakness       Communication   Communication: HOH  Cognition Arousal/Alertness: Awake/alert Behavior During Therapy: Anxious Overall Cognitive Status: Within Functional Limits for tasks assessed                                 General Comments: not great awareness of safety; "not sure if i can do this," regarding PT session. "But just want to go home, lay in the bed and have people wait on."      General Comments      Exercises     Assessment/Plan    PT Assessment Patient needs continued PT services  PT Problem List Decreased strength;Decreased mobility;Decreased activity tolerance;Cardiopulmonary status limiting activity;Pain;Decreased balance       PT Treatment Interventions Functional mobility training;Balance training;Patient/family education;Gait training;Therapeutic activities;Therapeutic exercise;Stair training    PT Goals (Current goals can be found in the Care Plan section)  Acute Rehab PT Goals Patient Stated Goal: to go home and be able to breathe PT Goal Formulation: With patient Time For Goal Achievement: 09/14/18 Potential to Achieve Goals: Good    Frequency Min 3X/week   Barriers to discharge Inaccessible home environment stairs to enter home    Co-evaluation               AM-PAC PT "6 Clicks" Daily Activity  Outcome Measure Difficulty turning over in bed (including adjusting bedclothes, sheets and blankets)?: A Little Difficulty moving from lying on back to sitting on the side of the bed? : Unable Difficulty sitting down on and standing up from a chair with arms (e.g., wheelchair, bedside commode, etc,.)?: A Little Help needed moving to and from a bed to chair (including a wheelchair)?: A Little Help needed walking in  hospital room?: A Little Help needed climbing 3-5 steps with a railing? : A Little 6 Click Score: 16    End of Session Equipment Utilized During Treatment: Gait belt;Oxygen Activity Tolerance: Other (comment)(anxiety) Patient left: in chair;with call bell/phone within reach Nurse Communication: Mobility status PT Visit Diagnosis: Muscle weakness (generalized) (M62.81);Unsteadiness on feet (R26.81);Difficulty in walking, not elsewhere classified (R26.2)    Time: 1103-1594 PT Time Calculation (min) (ACUTE ONLY): 24 min   Charges:   PT Evaluation $PT Eval Moderate Complexity: 1 Mod PT Treatments $Therapeutic Activity: 8-22 mins        Wray Kearns, PT, DPT Acute Rehabilitation Services Pager [phone] Office [place] 08/31/2018, 8:46 AM

## 2018-08-31 NOTE — Progress Notes (Signed)
Patient Demographics:    [name], is a 83 y.o. female, DOB - 14-Jun-1935, JWL:[id number]  Admit date - 08/27/2018   Admitting Physician Donne Hazel, MD  Outpatient Primary MD for the patient is Leonard Downing, MD  LOS - 3   No chief complaint on file.       Subjective:    [name] today has  No chest pain, occasional cp, reproducible, voiding ok   Assessment  & Plan :    Principal Problem:   NSTEMI (non-ST elevated myocardial infarction) ([place]) Active Problems:   Hyperlipemia   HTN (hypertension)   COPD exacerbation (HCC)   Abdominal pain   Constipation   Chest pain   Tobacco abuse   COPD, group D, by GOLD 2017 classification ([place])   Acute kidney injury ([place])   Acute exacerbation of chronic obstructive pulmonary disease (COPD) ([place])  Brief summary 83 y.o. female with medical history significant of CAD s/p prior stent placement, COPD, HTN, HLD, GERD, constipation admitted on 08/27/2018 with abdominal discomfort and right hip discomfort, she has also had SOB and CP with elevated troponin (.21)  in setting of known CAD and O2 dependent COPD   Plan:- 1)CP/NSTEMI--- history of CAD with prior stents , echo with preserved EF with no significant regional wall motion normalities, patient apparently intolerance to statins, patient declined cardiac cath, completed therapeutic Lovenox for 60 hrs for ACS, c/n  prophylactic Lovenox, continue Coreg 6.25 mg twice daily and aspirin 81 mg daily, Cardiology also advises Brilinta, Imdur 120 mg daily, cardiologist recommends continue IV Lasix for 1 more day due to concerns about some volume overload/CHF, fluid balance neg 3350 since admit , fluid balance was  neg 1430 ml yesterday and wt down 3.74 lbs  2)CAP--- with concerns about possible COPD exacerbation, continue doxycycline 100 mg, stopped IV Solu-Medrol, okay to complete prednisone 20 mg daily  ,  given concerns about possible NSTEMI avoid high-dose steroids  3)ABd Pain/Nausea and chronic constipation--- improved after BMs,  CT abdomen and pelvis with obstipation throughout transverse colon and ascending colon, had multiple BMs after mag citrate, continue Amitiza and PRN bentyl  4) acute on chronic hypoxic respiratory failure--treat #1 #2 as above, continue oxygen supplementation  5) anxiety/panic attack/disorder--- anxiety issues persist, continue Buspar 5 mg 3 times daily, Xanax 0.25 mg every 12 as needed  Disposition/Need for in-Hospital Stay- patient unable to be discharged at this time due to NSTEMI--- patient is at risk for further cardiac decompensation, currently requiring oxygen above at baseline,  cardiology advises IV diuresis- cardiologist recommends continue IV Lasix for 1 more day due to concerns about some volume overload/CHF,  Code Status : Full code   Disposition Plan  : Home with home health Consults  : Cardiology   DVT Prophylaxis  : Prophylactic Lovenox -   Lab Results  Component Value Date   PLT 224 08/31/2018    Inpatient Medications  Scheduled Meds: . amLODipine  5 mg Oral BID  . arformoterol  15 mcg Nebulization BID  . aspirin  81 mg Oral Daily  . budesonide  0.25 mg Nebulization BID  . busPIRone  5 mg Oral TID  . carvedilol  6.25 mg Oral BID WC  . cholecalciferol  1,000  Units Oral Daily  . doxycycline  100 mg Oral Q12H  . enoxaparin (LOVENOX) injection  40 mg Subcutaneous Q24H  . furosemide  40 mg Intravenous Daily  . gabapentin  300 mg Oral Q6H  . ipratropium-albuterol  3 mL Nebulization Q6H  . isosorbide mononitrate  120 mg Oral Daily  . lubiprostone  24 mcg Oral BID WC  . polyethylene glycol  17 g Oral Daily  . predniSONE  20 mg Oral Q breakfast  . senna-docusate  2 tablet Oral BID  . ticagrelor  90 mg Oral BID   Continuous Infusions: PRN Meds:.acetaminophen, ALPRAZolam, amitriptyline, dicyclomine, fluticasone, guaiFENesin,  hydrALAZINE, lactulose, morphine injection, MUSCLE RUB, nitroGLYCERIN    Anti-infectives (From admission, onward)   Start     Dose/Rate Route Frequency Ordered Stop   08/28/18 1515  doxycycline (VIBRA-TABS) tablet 100 mg     100 mg Oral Every 12 hours 08/28/18 1504     08/27/18 1645  cefTRIAXone (ROCEPHIN) 1 g in sodium chloride 0.9 % 100 mL IVPB  Status:  Discontinued     1 g 200 mL/hr over 30 Minutes Intravenous  Once 08/27/18 1637 08/27/18 1653   08/27/18 1645  azithromycin (ZITHROMAX) tablet 500 mg  Status:  Discontinued     500 mg Oral  Once 08/27/18 1637 08/27/18 1653        Objective:   Vitals:   08/31/18 0434 08/31/18 0717 08/31/18 1000 08/31/18 1426  BP: (!) 157/77  (!) 164/83   Pulse: 64  77   Resp: 17  (!) 21   Temp: 97.8 F (36.6 C)     TempSrc: Axillary     SpO2: 97% 95% 95% 100%  Weight: 88.7 kg     Height:        Wt Readings from Last 3 Encounters:  08/31/18 88.7 kg  08/20/18 91.2 kg  04/15/18 89.5 kg     Intake/Output Summary (Last 24 hours) at 08/31/2018 1553 Last data filed at 08/31/2018 1400 Gross per 24 hour  Intake 600 ml  Output 1600 ml  Net -1000 ml     Physical Exam Patient is examined daily including today on 08/31/18 , exams remain the same as of yesterday except that has changed   Gen:- Awake Alert, in no acute distress HEENT:- [place].AT, No sclera icterus Ears-very hard of hearing Nose-nasal cannula at 3L/min Neck-Supple Neck, +ve JVD,.  Lungs-improving air movement in bases with faint bibasilar rales,  CV- S1, S2 normal, regular Abd-  +ve B.Sounds, Abd Soft, No tenderness,    Extremity/Skin:-Good pulses, warm and dry Psych-affect is anxious, oriented x3 Neuro-no new focal deficits, no tremors   Data Review:   Micro Results No results found for this or any previous visit (from the past 240 hour(s)).  Radiology Reports Ct Abdomen Pelvis Wo Contrast  Result Date: 08/27/2018 CLINICAL DATA:  Generalized abdominal and flank  pain. EXAM: CT ABDOMEN AND PELVIS WITHOUT CONTRAST TECHNIQUE: Multidetector CT imaging of the abdomen and pelvis was performed following the standard protocol without IV contrast. COMPARISON:  04/14/2018 FINDINGS: Lower chest: Cardiomegaly with aortic atherosclerosis and dense left main and three-vessel coronary arteriosclerosis. No pericardial effusion or thickening. Dependent and subsegmental atelectasis at lung bases. Hepatobiliary: Biliary sludge and calculi noted within the gallbladder without wall thickening or pericholecystic fluid. Calcified granuloma in the right hepatic lobe adjacent to gallbladder. No biliary dilatation. Pancreas: Normal Spleen: Normal Adrenals/Urinary Tract: Hypodense left adrenal nodule measuring 13 x 15 mm compatible with a benign relatively stable since prior.  Punctate nonobstructing bilateral calcifications noted within both kidneys some which may be vascular. Simple appearing cysts arising off the lower poles both kidneys, on left measuring 2.1 x 2.2 x 1.8 cm and on right, 2.8 x 2 x 2.7 cm. Smaller parapelvic left-sided renal cysts are also suspected as well as cortically based cysts. No hydroureteronephrosis. The urinary bladder is unremarkable for the degree distention. Stomach/Bowel: Physiologic distention of the stomach. Normal small bowel rotation without obstruction. Redemonstration of extensive colonic diverticulosis without acute diverticulitis. The appendix is not confidently identified but no pericecal inflammatory change is noted. Vascular/Lymphatic: Moderate marked aortoiliac atherosclerosis without aneurysm. No lymphadenopathy by CT size criteria. Reproductive: Hysterectomy.  No adnexal mass. Other: No free air nor free fluid. Small right lateral hernia containing fat. Musculoskeletal: Stigmata posterior lumbar interbody fusion L1 through L5. Spondylosis included thoracolumbar spine otherwise noted without acute osseous appearing abnormality. IMPRESSION: 1. Cardiomegaly  with left main and three-vessel coronary arteriosclerosis. Thoracic and abdominal aortic atherosclerosis without visualized aneurysm. 2. Uncomplicated cholelithiasis.  Right hepatic granuloma. 3. Extensive colonic diverticulosis without evidence of acute diverticulitis. No bowel inflammation or obstruction. 4. Intrarenal calculi without obstruction noted bilaterally. Bilateral renal cysts. Stable left adrenal nodule compatible with a benign adenoma. 5. Right posterolateral fat containing hernia as before. Electronically Signed   By: Ashley Royalty M.D.   On: 08/27/2018 15:21   Dg Chest 2 View  Result Date: 08/27/2018 CLINICAL DATA:  Chest pain and cough today. EXAM: CHEST - 2 VIEW COMPARISON:  08/20/2018 FINDINGS: There is opacity in the right lower lobe consistent with pneumonia or atelectasis. Lungs are otherwise clear. No pleural effusion or pneumothorax. Cardiac silhouette is mildly enlarged. Coronary artery stents are stable. No mediastinal or hilar masses. No evidence of adenopathy. Skeletal structures are intact. IMPRESSION: 1. Opacity in the right lower lobe consistent with pneumonia or atelectasis. No other evidence of acute cardiopulmonary disease. 2. Stable cardiomegaly Electronically Signed   By: Lajean Manes M.D.   On: 08/27/2018 13:33   Dg Chest 2 View  Result Date: 08/20/2018 CLINICAL DATA:  Shortness of breath EXAM: CHEST - 2 VIEW COMPARISON:  04/14/2018, 02/17/2018 FINDINGS: Cardiomegaly without pleural effusion. Aortic atherosclerosis. Subsegmental atelectasis at the left base. No pneumothorax. Mild chronic bronchitic changes. Degenerative and postsurgical changes of the spine. IMPRESSION: 1. No focal pulmonary infiltrate 2. Cardiomegaly 3. Subsegmental atelectasis left lung base. Mild diffuse chronic appearing bronchitic changes Electronically Signed   By: Donavan Foil M.D.   On: 08/20/2018 17:59     CBC Recent Labs  Lab 08/27/18 1224 08/28/18 0312 08/29/18 0448 08/30/18 0348  08/31/18 0402  WBC 7.3 6.5 6.0 11.3* 8.5  HGB 15.0 13.0 13.4 13.6 13.6  HCT 47.5* 42.2 42.7 43.2 43.9  PLT 205 195 207 220 224  MCV 92.8 94.6 93.2 92.9 92.0  MCH 29.3 29.1 29.3 29.2 28.5  MCHC 31.6 30.8 31.4 31.5 31.0  RDW 11.7 11.9 11.8 11.9 11.9    Chemistries  Recent Labs  Lab 08/27/18 1224 08/28/18 0312 08/28/18 1500 08/29/18 0448 08/30/18 0348 08/31/18 0402  NA 140 141 135 136 138 138  K 2.8* 3.7 4.1 4.7 4.4 4.5  CL 99 101 100 99 101 99  CO2 27 31 29 30 29 29   GLUCOSE 106* 105* 142* 165* 109* 108*  BUN 15 20 24* 19 21 27*  CREATININE 1.12* 1.24* 1.23* 1.10* 1.00 1.25*  CALCIUM 9.8 8.9 8.9 9.3 9.5 9.7  AST 21 18  --   --   --   --  ALT 13 13  --   --   --   --   ALKPHOS 49 42  --   --   --   --   BILITOT 0.4 0.4  --   --   --   --    ------------------------------------------------------------------------------------------------------------------ No results for input(s): CHOL, HDL, LDLCALC, TRIG, CHOLHDL, LDLDIRECT in the last 72 hours.  Lab Results  Component Value Date   HGBA1C 6.5 (H) 10/06/2017   ------------------------------------------------------------------------------------------------------------------ No results for input(s): TSH, T4TOTAL, T3FREE, THYROIDAB in the last 72 hours.  Invalid input(s): FREET3 ------------------------------------------------------------------------------------------------------------------ No results for input(s): VITAMINB12, FOLATE, FERRITIN, TIBC, IRON, RETICCTPCT in the last 72 hours.  Coagulation profile No results for input(s): INR, PROTIME in the last 168 hours.  No results for input(s): DDIMER in the last 72 hours.  Cardiac Enzymes Recent Labs  Lab 08/27/18 1948 08/28/18 0312 08/28/18 0726  TROPONINI 0.21* 0.15* 0.12*   ------------------------------------------------------------------------------------------------------------------    Component Value Date/Time   BNP 254.1 (H) 08/30/2018 0348   BNP  82.7 09/09/2016 1236   Padraig Nhan M.D on 08/31/2018 at 3:53 PM  Pager---[phone] Go to [email or web address] - password [email or web address] for contact info  [facility] - Office  [phone]

## 2018-08-31 NOTE — Care Management Note (Signed)
Case Management Note  Patient Details  Name: [name] MRN: [id number] Date of Birth: 01/04/1935  Subjective/Objective: Pt presented for Nstemi- PTA From home with support of sister. Per patient her sister drives to get her grocery, medications and takes her to appointments. Patient states she cooks for herself. Pt utilizes [facility] for medications. Patient is agreeable to [facility] for medications to be delivered to room once stable.                   Action/Plan: CM did discuss [place] with the patient and she has chosen [place]. Referral made to [facility] with Alvis Lemmings and [facility] to begin within 24-48 hours post transition home. Pt will need HH Orders and F2F. No further needs from CM at this time.   Expected Discharge Date:                  Expected Discharge Plan:  [place]  In-House Referral:  NA  Discharge planning Services  CM Consult  Post Acute Care Choice:  Home Health Choice offered to:  Patient  DME Arranged:  N/A DME Agency:  NA  HH Arranged:  RN, Disease Management, PT, Nurse's Aide [place] Agency:  [place]  Status of Service:  Completed, signed off  If discussed at [place] of Stay Meetings, dates discussed:    Additional Comments:  Bethena Roys, RN 08/31/2018, 11:36 AM

## 2018-08-31 NOTE — Progress Notes (Addendum)
Progress Note  Patient Name: [name] Date of Encounter: 08/31/2018  Primary Cardiologist: Minus Breeding, MD   Subjective   Some upper Lt chest pain, not severe, increases to palpation.  She notes improved breathing  Inpatient Medications    Scheduled Meds: . amLODipine  5 mg Oral Daily  . arformoterol  15 mcg Nebulization BID  . aspirin  81 mg Oral Daily  . budesonide  0.25 mg Nebulization BID  . busPIRone  5 mg Oral TID  . carvedilol  6.25 mg Oral BID WC  . cholecalciferol  1,000 Units Oral Daily  . doxycycline  100 mg Oral Q12H  . enoxaparin (LOVENOX) injection  40 mg Subcutaneous Q24H  . furosemide  40 mg Intravenous Daily  . gabapentin  300 mg Oral Q6H  . ipratropium-albuterol  3 mL Nebulization Q6H  . isosorbide mononitrate  120 mg Oral Daily  . lubiprostone  24 mcg Oral BID WC  . polyethylene glycol  17 g Oral Daily  . predniSONE  20 mg Oral Q breakfast  . senna-docusate  2 tablet Oral BID  . ticagrelor  90 mg Oral BID   Continuous Infusions:  PRN Meds: acetaminophen, ALPRAZolam, amitriptyline, dicyclomine, fluticasone, guaiFENesin, hydrALAZINE, lactulose, morphine injection, MUSCLE RUB, nitroGLYCERIN   Vital Signs    Vitals:   08/31/18 0137 08/31/18 0434 08/31/18 0717 08/31/18 1000  BP:  (!) 157/77  (!) 164/83  Pulse:  64  77  Resp:  17  (!) 21  Temp:  97.8 F (36.6 C)    TempSrc:  Axillary    SpO2: 96% 97% 95% 95%  Weight:  88.7 kg    Height:        Intake/Output Summary (Last 24 hours) at 08/31/2018 1237 Last data filed at 08/31/2018 1100 Gross per 24 hour  Intake 360 ml  Output 2000 ml  Net -1640 ml   Filed Weights   08/29/18 0147 08/30/18 0553 08/31/18 0434  Weight: 90.7 kg 90.4 kg 88.7 kg    Telemetry    SR to SB at times - Personally Reviewed  ECG    No new - Personally Reviewed  Physical Exam   GEN: No acute distress.  Sitting up in chair Neck: No JVD Cardiac: RRR, no murmurs, rubs, or gallops.  Respiratory:  Breath sounds to auscultation bilaterally. Few rales  GI: Soft, nontender, non-distended  MS: No edema; No deformity. Neuro:  Nonfocal  Psych: Normal affect   Labs    Chemistry Recent Labs  Lab 08/27/18 1224 08/28/18 0312  08/29/18 0448 08/30/18 0348 08/31/18 0402  NA 140 141   < > 136 138 138  K 2.8* 3.7   < > 4.7 4.4 4.5  CL 99 101   < > 99 101 99  CO2 27 31   < > 30 29 29   GLUCOSE 106* 105*   < > 165* 109* 108*  BUN 15 20   < > 19 21 27*  CREATININE 1.12* 1.24*   < > 1.10* 1.00 1.25*  CALCIUM 9.8 8.9   < > 9.3 9.5 9.7  PROT 6.6 5.9*  --   --   --   --   ALBUMIN 3.7 3.4*  --   --   --   --   AST 21 18  --   --   --   --   ALT 13 13  --   --   --   --   ALKPHOS 49 42  --   --   --   --  BILITOT 0.4 0.4  --   --   --   --   GFRNONAA 45* 40*   < > 46* 51* 39*  GFRAA 52* 46*   < > 53* 60* 45*  ANIONGAP 14 9   < > 7 8 10    < > = values in this interval not displayed.     Hematology Recent Labs  Lab 08/29/18 0448 08/30/18 0348 08/31/18 0402  WBC 6.0 11.3* 8.5  RBC 4.58 4.65 4.77  HGB 13.4 13.6 13.6  HCT 42.7 43.2 43.9  MCV 93.2 92.9 92.0  MCH 29.3 29.2 28.5  MCHC 31.4 31.5 31.0  RDW 11.8 11.9 11.9  PLT 207 220 224    Cardiac Enzymes Recent Labs  Lab 08/27/18 1948 08/28/18 0312 08/28/18 0726  TROPONINI 0.21* 0.15* 0.12*    Recent Labs  Lab 08/27/18 1259  TROPIPOC 0.09*     BNP Recent Labs  Lab 08/30/18 0348  BNP 254.1*     DDimer No results for input(s): DDIMER in the last 168 hours.   Radiology    No results found.  Cardiac Studies   Echo 08/28/18 Study Conclusions  - Left ventricle: The cavity size was normal. Wall thickness was   increased in a pattern of moderate LVH. Systolic function was   normal. The estimated ejection fraction was in the range of 60%   to 65%. Wall motion was normal; there were no regional wall   motion abnormalities. Doppler parameters are consistent with   abnormal left ventricular relaxation (grade 1  diastolic   dysfunction). The E/e&' ratio is between 8-15, suggesting   indeterminate LV filling pressure. - Mitral valve: Calcified annulus. Mildly thickened leaflets .   There was trivial regurgitation. - Left atrium: The atrium was mildly dilated. - Inferior vena cava: The vessel was normal in size. The   respirophasic diameter changes were in the normal range (>= 50%),   consistent with normal central venous pressure.  Impressions:  - Compared to a prior study in 2018, there are no significant   changes.   Patient Profile     83 y.o. female Admitted with SOB and seen 10/11 for CP and + Tn (.21)  in setting of known CAD and O2 dependent COPD and BNP of 254.  Pt declines cath  Assessment & Plan    NSTEMI  - heparin stopped and now on tricagelor on Imdur 120 mg daily  HTN added amlodipine up some today still 165/83 increase amlodipine     Unable to increase BB with brady.   CHF given lasix yesterday neg 3350 since admit  Lasix 40 mg IV daily From yesterday neg 1430 ml and wt down 3.74 lbs -monitor Cr would give one more day  Anxiety per IM   CKD- 3 Cr 1.25 today   HLD intolerant to statins.     For questions or updates, please contact [place] Please consult [email or web address] for contact info under        Signed, Cecilie Kicks, NP  08/31/2018, 12:37 PM   ------------------------------------------------------------------------------------------------------   History and all data above reviewed.  Patient examined.  I agree with the findings as above.  [name] has no acute concerns today. Is sleeping on my visit.  All available labs, radiology testing, previous records reviewed. Agree with documented assessment and plan of my colleague as stated above with the following additions or changes:  Principal Problem:   NSTEMI (non-ST elevated myocardial infarction) [facility]) Active Problems:  Hyperlipemia   HTN (hypertension)   COPD exacerbation (HCC)    Abdominal pain   Constipation   Chest pain   Tobacco abuse   COPD, group D, by GOLD 2017 classification ([place])   Acute kidney injury ([place])   Acute exacerbation of chronic obstructive pulmonary disease (COPD) ([place])    Plan: We will increase her amlodipine to 5 mg twice daily and see how she tolerates this change.  No other changes to cardiovascular medication therapy.  Her amlodipine can be consolidated to 10 mg a day if she tolerates well for ease of administration upon dismissal.  Elouise Munroe, MD [facility] 7:16 PM  08/31/2018

## 2018-09-01 ENCOUNTER — Telehealth: Payer: Self-pay | Admitting: Pulmonary Disease

## 2018-09-01 ENCOUNTER — Inpatient Hospital Stay (HOSPITAL_COMMUNITY): Payer: Medicare Other

## 2018-09-01 DIAGNOSIS — I5033 Acute on chronic diastolic (congestive) heart failure: Secondary | ICD-10-CM

## 2018-09-01 LAB — CBC
HCT: 43 % (ref 36.0–46.0)
Hemoglobin: 13.5 g/dL (ref 12.0–15.0)
MCH: 28.9 pg (ref 26.0–34.0)
MCHC: 31.4 g/dL (ref 30.0–36.0)
MCV: 92.1 fL (ref 80.0–100.0)
PLATELETS: 219 10*3/uL (ref 150–400)
RBC: 4.67 MIL/uL (ref 3.87–5.11)
RDW: 12.1 % (ref 11.5–15.5)
WBC: 8.8 10*3/uL (ref 4.0–10.5)
nRBC: 0 % (ref 0.0–0.2)

## 2018-09-01 LAB — BASIC METABOLIC PANEL
Anion gap: 13 (ref 5–15)
BUN: 33 mg/dL — AB (ref 8–23)
CALCIUM: 9.4 mg/dL (ref 8.9–10.3)
CO2: 30 mmol/L (ref 22–32)
CREATININE: 1.29 mg/dL — AB (ref 0.44–1.00)
Chloride: 97 mmol/L — ABNORMAL LOW (ref 98–111)
GFR calc Af Amer: 44 mL/min — ABNORMAL LOW (ref >60)
GFR, EST NON AFRICAN AMERICAN: 38 mL/min — AB (ref >60)
GLUCOSE: 129 mg/dL — AB (ref 70–99)
Potassium: 3.7 mmol/L (ref 3.5–5.1)
SODIUM: 140 mmol/L (ref 135–145)

## 2018-09-01 MED ORDER — CARVEDILOL 12.5 MG PO TABS: 12.5000 mg | ORAL_TABLET | Freq: Two times a day (BID) | ORAL | Status: DC

## 2018-09-01 MED ORDER — IPRATROPIUM-ALBUTEROL 0.5-2.5 (3) MG/3ML IN SOLN: 3.0000 mL | Freq: Two times a day (BID) | RESPIRATORY_TRACT | Status: DC

## 2018-09-01 MED ORDER — BUSPIRONE HCL 10 MG PO TABS: 10.0000 mg | ORAL_TABLET | Freq: Two times a day (BID) | ORAL | 2 refills | Status: AC

## 2018-09-01 MED ORDER — HYDRALAZINE HCL 25 MG PO TABS: 25.0000 mg | ORAL_TABLET | Freq: Three times a day (TID) | ORAL | 11 refills | Status: DC

## 2018-09-01 MED ORDER — ASPIRIN 81 MG PO TABS: 81.0000 mg | ORAL_TABLET | Freq: Every day | ORAL | 5 refills | Status: AC

## 2018-09-01 MED ORDER — ALPRAZOLAM 0.25 MG PO TABS: 0.2500 mg | ORAL_TABLET | Freq: Two times a day (BID) | ORAL | 0 refills | Status: DC | PRN

## 2018-09-01 MED ORDER — SENNOSIDES-DOCUSATE SODIUM 8.6-50 MG PO TABS: 2.0000 | ORAL_TABLET | Freq: Two times a day (BID) | ORAL | 2 refills | Status: DC

## 2018-09-01 MED ORDER — AMLODIPINE BESYLATE 10 MG PO TABS: 10.0000 mg | ORAL_TABLET | Freq: Every day | ORAL | 2 refills | Status: DC

## 2018-09-01 MED ORDER — FUROSEMIDE 20 MG PO TABS: 40.0000 mg | ORAL_TABLET | Freq: Every day | ORAL | 2 refills | Status: DC

## 2018-09-01 MED ORDER — TICAGRELOR 90 MG PO TABS: 90.0000 mg | ORAL_TABLET | Freq: Two times a day (BID) | ORAL | 2 refills | Status: DC

## 2018-09-01 MED ORDER — AMLODIPINE BESYLATE 5 MG PO TABS: 5.0000 mg | ORAL_TABLET | Freq: Every day | ORAL | 2 refills | Status: DC

## 2018-09-01 MED ORDER — CARVEDILOL 12.5 MG PO TABS: 12.5000 mg | ORAL_TABLET | Freq: Two times a day (BID) | ORAL | 3 refills | Status: DC

## 2018-09-01 MED ORDER — GUAIFENESIN ER 600 MG PO TB12: 600.0000 mg | ORAL_TABLET | Freq: Two times a day (BID) | ORAL | 0 refills | Status: AC

## 2018-09-01 MED ORDER — IPRATROPIUM-ALBUTEROL 0.5-2.5 (3) MG/3ML IN SOLN
3.0000 mL | Freq: Three times a day (TID) | RESPIRATORY_TRACT | Status: DC
  Administered 2018-09-01: 3 mL via RESPIRATORY_TRACT
  Filled 2018-09-01: qty 3

## 2018-09-01 MED ORDER — PHENOL 1.4 % MT LIQD
1.0000 | OROMUCOSAL | Status: DC | PRN
  Administered 2018-09-01: 1 via OROMUCOSAL
  Filled 2018-09-01: qty 177

## 2018-09-01 MED ORDER — DOXYCYCLINE HYCLATE 100 MG PO TABS: 100.0000 mg | ORAL_TABLET | Freq: Two times a day (BID) | ORAL | 0 refills | Status: AC

## 2018-09-01 MED ORDER — GABAPENTIN 100 MG PO CAPS: 100.0000 mg | ORAL_CAPSULE | Freq: Two times a day (BID) | ORAL | 0 refills | Status: DC | PRN

## 2018-09-01 MED ORDER — CARVEDILOL 6.25 MG PO TABS
6.2500 mg | ORAL_TABLET | Freq: Once | ORAL | Status: AC
  Administered 2018-09-01: 6.25 mg via ORAL
  Filled 2018-09-01: qty 1

## 2018-09-01 MED ORDER — POLYETHYLENE GLYCOL 3350 17 G PO PACK: 17.0000 g | PACK | Freq: Every day | ORAL | 0 refills | Status: AC | PRN

## 2018-09-01 MED FILL — GABAPENTIN 100 MG CAPSULE: 100 | 15 days supply | Qty: 30 | Fill #0 | Status: TO

## 2018-09-01 MED FILL — CARVEDILOL 12.5 MG TABLET: 12.5 | 30 days supply | Qty: 60 | Fill #0 | Status: TO

## 2018-09-01 MED FILL — DOXYCYCLINE HYCLATE 100 MG: 100 | 5 days supply | Qty: 10 | Fill #0

## 2018-09-01 MED FILL — ASPIRIN LOW DOSE 81 MG TBEC: 81 | 30 days supply | Qty: 30 | Fill #0 | Status: TO

## 2018-09-01 MED FILL — busPIRone HCL 10 MG TABS: 10 | 30 days supply | Qty: 60 | Fill #0 | Status: TO

## 2018-09-01 MED FILL — SENNA S 8.6-50 MG TABS: 8.6-50 | 25 days supply | Qty: 100 | Fill #0 | Status: TO

## 2018-09-01 MED FILL — AMLODIPINE BESYLATE 5 MG TA: 5 | 30 days supply | Qty: 30 | Fill #0 | Status: TO

## 2018-09-01 MED FILL — POLYETHYLENE GLYCOL 3350 PO: 14 days supply | Qty: 238 | Fill #0

## 2018-09-01 MED FILL — hydrALAZINE HCL 25 MG TABS: 25 | 30 days supply | Qty: 90 | Fill #0 | Status: TO

## 2018-09-01 MED FILL — BRILINTA 90 MG TABLET: 90 | 30 days supply | Qty: 60 | Fill #0 | Status: TO

## 2018-09-01 MED FILL — FUROSEMIDE 20 MG TAB: 20 | 30 days supply | Qty: 60 | Fill #0 | Status: TO

## 2018-09-01 NOTE — Telephone Encounter (Signed)
Attempted to contact [name] at [facility]. I did not receive an answer. There was no option for me to leave a message. Will try back.   **Pt is currently admitted**

## 2018-09-01 NOTE — Discharge Summary (Signed)
[name], is a 83 y.o. female  DOB 22-Dec-1934  MRN [id number].  Admission date:  08/27/2018  Admitting Physician  Donne Hazel, MD  Discharge Date:  09/01/2018   Primary MD  Leonard Downing, MD  Recommendations for primary care physician for things to follow:   1)1)Very low-salt diet advised 2)Weigh yourself daily, call if you gain more than 3 pounds in 1 day or more than 5 pounds in 1 week as your diuretic medications may need to be adjusted 3)Limit your Fluid  intake to no more than 60 ounces (1.8 Liters) per day 4)You are taking Brilinta and Aspirin which would make your blood Thin so Avoid ibuprofen/Advil/Aleve/Motrin/Goody Powders/Naproxen/BC powders/Meloxicam/Diclofenac/Indomethacin and other Nonsteroidal anti-inflammatory medications as these will make you more likely to bleed and can cause stomach ulcers, can also cause Kidney problems.  5) use oxygen continuously as prescribed 6) decrease gabapentin to 100 mg every 12 hours as needed only 7) follow-up with cardiologist/heart specialist as scheduled and as advised 8) repeat CBC/complete blood count and repeat BMP/kidney test and electrolyte test within 7 days with your primary care doctor or with your cardiologist 9) avoid constipation   Admission Diagnosis  Hypokalemia [E87.6] Elevated troponin [R79.89] Right sided abdominal pain [R10.9] Chest pain, unspecified type [R07.9]   Discharge Diagnosis  Hypokalemia [E87.6] Elevated troponin [R79.89] Right sided abdominal pain [R10.9] Chest pain, unspecified type [R07.9]    Principal Problem:   NSTEMI (non-ST elevated myocardial infarction) ([place]) Active Problems:   Hyperlipemia   HTN (hypertension)   COPD exacerbation (HCC)   Abdominal pain   Constipation   Chest pain   Tobacco abuse   COPD, group D, by GOLD 2017 classification ([place])   Acute kidney injury ([place])   Acute  exacerbation of chronic obstructive pulmonary disease (COPD) ([place])      Past Medical History:  Diagnosis Date  . Arthritis    "in my spine" (04/15/2018)  . Blurred vision   . CAD (coronary artery disease)    Multiple percutaneous revascularization. Catheterization May 2013 left main normal, patent LAD stent, 90% circumflex stenosis, patent right coronary artery stents. Patient had a DES to the circumflex. The EF was well-preserved.  . Chronic lower back pain   . COPD (chronic obstructive pulmonary disease) ([place])   . Fatigue   . GERD (gastroesophageal reflux disease)   . Hard of hearing   . HTN (hypertension)   . Hx of radiation therapy    right nose 4500 cGy in 10 sessions over 5 weeks (2 treatments a week)  . Hyperlipemia   . NSTEMI (non-ST elevated myocardial infarction) ([place]) 08/28/2018  . On home oxygen therapy    "2L; 24/7" (08/27/2018)  . Pneumonia    "I've had a touch of it" (04/15/2018)  . Squamous cell carcinoma of nose    right    Past Surgical History:  Procedure Laterality Date  . ABDOMINAL HYSTERECTOMY    . BACK SURGERY  ?  . CORONARY ANGIOPLASTY WITH STENT PLACEMENT  2002-2009  .  CORONARY ANGIOPLASTY WITH STENT PLACEMENT  05/06/12   "1; this makes 5"  . DILATION AND CURETTAGE OF UTERUS    . ESOPHAGEAL DILATION  ~ 2012   Dr. Deatra Ina  . LUMBAR SPINE SURGERY  04/2011   Rods, screws and cages  . PERCUTANEOUS CORONARY STENT INTERVENTION (PCI-S) N/A 05/06/2012   Procedure: PERCUTANEOUS CORONARY STENT INTERVENTION (PCI-S);  Surgeon: Burnell Blanks, MD;  Location: [facility] C[facility] LAB;  Service: Cardiovascular;  Laterality: N/A;  . SKIN BIOPSY  10/15/13   right nose-microinvasive squamous cell carcinoma  . TUBAL LIGATION         HPI  from the history and physical done on the day of admission:     Chief Complaint: Chest pain, abd pain  HPI: [name] is a 83 y.o. female with medical history significant of CAD s/p prior stent placement, COPD, HTN, HLD, GERD,  constipation who presented to the ED with initial complaints of R sided "hip" pain. Of note, pt is poor historian and detailed history was difficult to obtain. Patient did complain of constantly increased abd girth with continued daily bowel movement, however only small amount of stool per episode. Pt reports chest pains located mid-sternally, worse with cough and with palpation.   ED Course: In the ED, patient underwent CT abd with no acute findings noted. Patient was given one dose of morphine which resolved chest pain and "flank" pain. Initial troponin noted to be 0.09. EKG unchanged from prior. CXR with possible opacity in RLL consistent with PNA vs atelectasis. Given cardiac risk factors and known cardiac history, hosptialist consulted for consideration for admission.    Hospital Course:    Brief summary 83 y.o.femalewith medical history significant ofCAD s/p prior stent placement, COPD, HTN, HLD, GERD, constipation admitted on 08/27/2018 with abdominal discomfort and right hip discomfort, she has also had SOB and CP with elevated troponin (.21) in setting of known CAD and O2 dependent COPD   Plan:- 1)CP/NSTEMI--- history of CAD with prior stents , echo with preserved EF with no significant regional wall motion normalities, patient apparently intolerance to statins, patient declined cardiac cath, completed therapeutic Lovenox for 60 hrs for ACS,  dc home on Coreg 12.5 mg twice daily and aspirin 81 mg daily, Cardiology also advises Brilinta, Imdur 120 mg daily, treated with IV Lasix due to concerns about some volume overload/CHF, fluid balance neg over 3L, and wt is down 4lbs .  2)CAP--- with concerns about possible COPD exacerbation, dc home on doxycycline 100 mg, treated with IV Solu-Medrol, transition to prednisone 20 mg daily ,  given concerns about possible NSTEMI avoid high-dose steroids  3)ABd Pain/Nausea and chronic constipation--- improved after BMs,  CT abdomen and pelvis with  obstipation throughout transverse colon and ascending colon, had multiple BMs after mag citrate, continue Amitiza and PRN bentyl  4) acute on chronic hypoxic respiratory failure--treat #1 #2 as above, continue oxygen supplementation  5) anxiety/panic attack/disorder--- anxiety issues stable, continue Buspar 5 mg 3 times daily, Xanax 0.25 mg every 12 as needed  Code Status : Full code  Disposition Plan  : Home with home health Consults  : Cardiology  Discharge Condition: stable  Follow UP  Follow-up Information    [facility]   Specialty:  Home Health Services Why:  Registered Nurse, Physical Therapy, Aide- [place] to call within 24-48 hours of transition home to provide a time to visit.  Contact information: [address] [place] [address] [phone]  Diet and Activity recommendation:  As advised  Discharge Instructions    Discharge Instructions    Call MD for:  difficulty breathing, headache or visual disturbances   Complete by:  As directed    Call MD for:  persistant dizziness or light-headedness   Complete by:  As directed    Call MD for:  persistant nausea and vomiting   Complete by:  As directed    Call MD for:  temperature >100.4   Complete by:  As directed    Diet - low sodium heart healthy   Complete by:  As directed    Discharge instructions   Complete by:  As directed    1)1)Very low-salt diet advised 2)Weigh yourself daily, call if you gain more than 3 pounds in 1 day or more than 5 pounds in 1 week as your diuretic medications may need to be adjusted 3)Limit your Fluid  intake to no more than 60 ounces (1.8 Liters) per day 4)You are taking Brilinta and Aspirin which would make your blood Thin so Avoid ibuprofen/Advil/Aleve/Motrin/Goody Powders/Naproxen/BC powders/Meloxicam/Diclofenac/Indomethacin and other Nonsteroidal anti-inflammatory medications as these will make you more likely to bleed and can  cause stomach ulcers, can also cause Kidney problems.  5) use oxygen continuously as prescribed 6) decrease gabapentin to 100 mg every 12 hours as needed only 7) follow-up with cardiologist/heart specialist as scheduled and as advised 8) repeat CBC/complete blood count and repeat BMP/kidney test and electrolyte test within 7 days with your primary care doctor or with your cardiologist 9) avoid constipation   Increase activity slowly   Complete by:  As directed         Discharge Medications     Allergies as of 09/01/2018      Reactions   Iohexol Other (See Comments)   Code:  HIVES, Desc:  PER ROBIN @ GI, PT IS ALLERGIC TO IVP DYE 08/28/10 RM   Lipitor [atorvastatin Calcium] Hives      Medication List    STOP taking these medications   docusate sodium 100 MG capsule Commonly known as:  COLACE   ibuprofen 400 MG tablet Commonly known as:  ADVIL,MOTRIN   psyllium 28 % packet Commonly known as:  METAMUCIL SMOOTH TEXTURE     TAKE these medications   acetaminophen 650 MG CR tablet Commonly known as:  TYLENOL Take 1 tablet (650 mg total) by mouth every 8 (eight) hours as needed. What changed:  reasons to take this   albuterol 108 (90 Base) MCG/ACT inhaler Commonly known as:  PROVENTIL HFA;VENTOLIN HFA Inhale 2 puffs into the lungs every 4 (four) hours as needed for wheezing.   ALPRAZolam 0.25 MG tablet Commonly known as:  XANAX Take 1 tablet (0.25 mg total) by mouth 2 (two) times daily as needed for anxiety or sleep.   amitriptyline 10 MG tablet Commonly known as:  ELAVIL Take 10-20 mg by mouth at bedtime as needed (for pain).   amLODipine 5 MG tablet Commonly known as:  NORVASC Take 1 tablet (5 mg total) by mouth daily.   arformoterol 15 MCG/2ML Nebu Commonly known as:  BROVANA Take 2 mLs (15 mcg total) by nebulization 2 (two) times daily.   aspirin 81 MG tablet Take 1 tablet (81 mg total) by mouth daily. With food What changed:  additional instructions     budesonide 0.25 MG/2ML nebulizer solution Commonly known as:  PULMICORT Take 2 mLs (0.25 mg total) by nebulization 2 (two) times daily. What changed:  when to take  this   busPIRone 10 MG tablet Commonly known as:  BUSPAR Take 1 tablet (10 mg total) by mouth 2 (two) times daily.   carvedilol 12.5 MG tablet Commonly known as:  COREG Take 1 tablet (12.5 mg total) by mouth 2 (two) times daily with a meal. What changed:    medication strength  how much to take   cholecalciferol 1000 units tablet Commonly known as:  VITAMIN D Take 1,000 Units by mouth daily.   dicyclomine 20 MG tablet Commonly known as:  BENTYL Take 1 tablet (20 mg total) by mouth 3 (three) times daily as needed for spasms. Cramping abdominal pain.   doxycycline 100 MG tablet Commonly known as:  VIBRA-TABS Take 1 tablet (100 mg total) by mouth 2 (two) times daily for 5 days.   fluticasone 50 MCG/ACT nasal spray Commonly known as:  FLONASE Place 2 sprays into both nostrils daily as needed for allergies or rhinitis.   furosemide 20 MG tablet Commonly known as:  LASIX Take 2 tablets (40 mg total) by mouth daily.   gabapentin 100 MG capsule Commonly known as:  NEURONTIN Take 1 capsule (100 mg total) by mouth 2 (two) times daily as needed (pain). What changed:    medication strength  how much to take  when to take this  reasons to take this  additional instructions   guaiFENesin 600 MG 12 hr tablet Commonly known as:  MUCINEX Take 1 tablet (600 mg total) by mouth 2 (two) times daily. What changed:    when to take this  reasons to take this   hydrALAZINE 25 MG tablet Commonly known as:  APRESOLINE Take 1 tablet (25 mg total) by mouth 3 (three) times daily.   isosorbide mononitrate 120 MG 24 hr tablet Commonly known as:  IMDUR Take 1 tablet (120 mg total) by mouth daily.   lubiprostone 24 MCG capsule Commonly known as:  AMITIZA Take 1 capsule (24 mcg total) by mouth 2 (two) times daily  with a meal.   nitroGLYCERIN 0.4 MG SL tablet Commonly known as:  NITROSTAT Place 0.4 mg under the tongue every 5 (five) minutes as needed for chest pain.   polyethylene glycol packet Commonly known as:  MIRALAX / GLYCOLAX Take 17 g by mouth daily as needed for mild constipation or moderate constipation. What changed:    when to take this  reasons to take this   ranitidine 300 MG tablet Commonly known as:  ZANTAC Take 0.5 tablets (150 mg total) by mouth 2 (two) times daily.   Revefenacin 175 MCG/3ML Soln Inhale 3 mLs into the lungs daily. J44.9   senna-docusate 8.6-50 MG tablet Commonly known as:  Senokot-S Take 2 tablets by mouth 2 (two) times daily.   ticagrelor 90 MG Tabs tablet Commonly known as:  BRILINTA Take 1 tablet (90 mg total) by mouth 2 (two) times daily.   trolamine salicylate 10 % cream Commonly known as:  ASPERCREME Apply 1 application topically daily as needed for muscle pain.       Major procedures and Radiology Reports - PLEASE review detailed and final reports for all details, in brief -   Ct Abdomen Pelvis Wo Contrast  Result Date: 08/27/2018 CLINICAL DATA:  Generalized abdominal and flank pain. EXAM: CT ABDOMEN AND PELVIS WITHOUT CONTRAST TECHNIQUE: Multidetector CT imaging of the abdomen and pelvis was performed following the standard protocol without IV contrast. COMPARISON:  04/14/2018 FINDINGS: Lower chest: Cardiomegaly with aortic atherosclerosis and dense left main and three-vessel coronary arteriosclerosis.  No pericardial effusion or thickening. Dependent and subsegmental atelectasis at lung bases. Hepatobiliary: Biliary sludge and calculi noted within the gallbladder without wall thickening or pericholecystic fluid. Calcified granuloma in the right hepatic lobe adjacent to gallbladder. No biliary dilatation. Pancreas: Normal Spleen: Normal Adrenals/Urinary Tract: Hypodense left adrenal nodule measuring 13 x 15 mm compatible with a benign  relatively stable since prior. Punctate nonobstructing bilateral calcifications noted within both kidneys some which may be vascular. Simple appearing cysts arising off the lower poles both kidneys, on left measuring 2.1 x 2.2 x 1.8 cm and on right, 2.8 x 2 x 2.7 cm. Smaller parapelvic left-sided renal cysts are also suspected as well as cortically based cysts. No hydroureteronephrosis. The urinary bladder is unremarkable for the degree distention. Stomach/Bowel: Physiologic distention of the stomach. Normal small bowel rotation without obstruction. Redemonstration of extensive colonic diverticulosis without acute diverticulitis. The appendix is not confidently identified but no pericecal inflammatory change is noted. Vascular/Lymphatic: Moderate marked aortoiliac atherosclerosis without aneurysm. No lymphadenopathy by CT size criteria. Reproductive: Hysterectomy.  No adnexal mass. Other: No free air nor free fluid. Small right lateral hernia containing fat. Musculoskeletal: Stigmata posterior lumbar interbody fusion L1 through L5. Spondylosis included thoracolumbar spine otherwise noted without acute osseous appearing abnormality. IMPRESSION: 1. Cardiomegaly with left main and three-vessel coronary arteriosclerosis. Thoracic and abdominal aortic atherosclerosis without visualized aneurysm. 2. Uncomplicated cholelithiasis.  Right hepatic granuloma. 3. Extensive colonic diverticulosis without evidence of acute diverticulitis. No bowel inflammation or obstruction. 4. Intrarenal calculi without obstruction noted bilaterally. Bilateral renal cysts. Stable left adrenal nodule compatible with a benign adenoma. 5. Right posterolateral fat containing hernia as before. Electronically Signed   By: Ashley Royalty M.D.   On: 08/27/2018 15:21   Dg Chest 2 View  Result Date: 09/01/2018 CLINICAL DATA:  Pneumonia. EXAM: CHEST - 2 VIEW COMPARISON:  08/27/2018 FINDINGS: The cardiac silhouette remains enlarged. Patchy right lower  lobe opacity on the prior study has improved with slight asymmetric density remaining. No new airspace consolidation, pleural effusion, pneumothorax is identified. Aortic atherosclerosis and lumbar spine fusion are noted. IMPRESSION: Improving right lower lobe pneumonia or atelectasis. Electronically Signed   By: Logan Bores M.D.   On: 09/01/2018 11:57   Dg Chest 2 View  Result Date: 08/27/2018 CLINICAL DATA:  Chest pain and cough today. EXAM: CHEST - 2 VIEW COMPARISON:  08/20/2018 FINDINGS: There is opacity in the right lower lobe consistent with pneumonia or atelectasis. Lungs are otherwise clear. No pleural effusion or pneumothorax. Cardiac silhouette is mildly enlarged. Coronary artery stents are stable. No mediastinal or hilar masses. No evidence of adenopathy. Skeletal structures are intact. IMPRESSION: 1. Opacity in the right lower lobe consistent with pneumonia or atelectasis. No other evidence of acute cardiopulmonary disease. 2. Stable cardiomegaly Electronically Signed   By: Lajean Manes M.D.   On: 08/27/2018 13:33   Dg Chest 2 View  Result Date: 08/20/2018 CLINICAL DATA:  Shortness of breath EXAM: CHEST - 2 VIEW COMPARISON:  04/14/2018, 02/17/2018 FINDINGS: Cardiomegaly without pleural effusion. Aortic atherosclerosis. Subsegmental atelectasis at the left base. No pneumothorax. Mild chronic bronchitic changes. Degenerative and postsurgical changes of the spine. IMPRESSION: 1. No focal pulmonary infiltrate 2. Cardiomegaly 3. Subsegmental atelectasis left lung base. Mild diffuse chronic appearing bronchitic changes Electronically Signed   By: Donavan Foil M.D.   On: 08/20/2018 17:59    Micro Results    No results found for this or any previous visit (from the past 240 hour(s)).     Today  Subjective    [name] today has no new complaints, no fevers, no chest pains, had bowel movement, breathing is better,          Patient has been seen and examined prior to discharge    Objective   Blood pressure 100/63, pulse 67, temperature 98.5 F (36.9 C), resp. rate 20, height 5\' 4"  (1.626 m), weight 88.1 kg, SpO2 98 %.   Intake/Output Summary (Last 24 hours) at 09/01/2018 1618 Last data filed at 09/01/2018 0700 Gross per 24 hour  Intake 342 ml  Output 850 ml  Net -508 ml    Exam Patient is examined daily including today on 09/01/18 , exams remain the same as of yesterday except that has changed   Gen:- Awake Alert, in no acute distress HEENT:- [place].AT, No sclera icterus Ears-very hard of hearing Nose-nasal cannula at 3L/min Neck-Supple Neck, +ve JVD,.  Lungs-improving air movement in bases , no wheezing  CV- S1, S2 normal, regular Abd-  +ve B.Sounds, Abd Soft, No tenderness,    Extremity/Skin:-Good pulses, warm and dry Psych-affect is anxious, oriented x3 Neuro-no new focal deficits, no tremors    Data Review   CBC w Diff:  Lab Results  Component Value Date   WBC 8.8 09/01/2018   HGB 13.5 09/01/2018   HCT 43.0 09/01/2018   PLT 219 09/01/2018   LYMPHOPCT 25.5 08/20/2018   MONOPCT 13.3 (H) 08/20/2018   EOSPCT 1.1 08/20/2018   BASOPCT 0.3 08/20/2018    CMP:  Lab Results  Component Value Date   NA 140 09/01/2018   NA 142 03/27/2018   K 3.7 09/01/2018   CL 97 (L) 09/01/2018   CO2 30 09/01/2018   BUN 33 (H) 09/01/2018   BUN 15 03/27/2018   CREATININE 1.29 (H) 09/01/2018   CREATININE 1.03 (H) 09/09/2016   PROT 5.9 (L) 08/28/2018   ALBUMIN 3.4 (L) 08/28/2018   BILITOT 0.4 08/28/2018   ALKPHOS 42 08/28/2018   AST 18 08/28/2018   ALT 13 08/28/2018  .   Total Discharge time is about 33 minutes  Roxan Hockey M.D on 09/01/2018 at 4:18 PM  Pager---[phone]  Go to [email or web address] - password [email or web address] for contact info  [facility] - Office  [phone]

## 2018-09-01 NOTE — Telephone Encounter (Signed)
Lorriane Shire is calling back [phone]

## 2018-09-01 NOTE — Telephone Encounter (Signed)
Called and spoke with [name] who stated pt's sister [name] and pt were confused about the new med that they thought they were supposed to be getting from [facility].  Stated to [name] that an order along with neb solution was sent to [facility] for the [place].   [name] expressed understanding and stated she would tell the sister this information and for her to call [place] with an update.  Nothing further needed.

## 2018-09-01 NOTE — Progress Notes (Signed)
Physical Therapy Treatment Patient Details Name: [name] MRN: [id number] DOB: Sep 06, 1935 Today's Date: 09/01/2018    History of Present Illness Patient is a 82 y/o female who presents with chest pain, abdominal pain and right hip pain. CXR-infiltrates in right lower lobe consistent with PNA vs atelectasis. Being treated for NSTEMI. PMH includes CAD s/p stent, HTN, HLD, COPD, depression.    PT Comments    Patient progressing slowly towards PT goals. Improved ambulation distance with encouragement. Pt self limiting with activity and anxious when she gets SOB. Sp02 remained >90% on 2L/min 02. Education re: pursed lip breathing and energy conservation techniques. Eager to return home. Declined further mobility due to fatigue and SOB. Will follow.    Follow Up Recommendations  Home health PT;Supervision for mobility/OOB     Equipment Recommendations  None recommended by PT    Recommendations for Other Services       Precautions / Restrictions Precautions Precautions: Fall Precaution Comments: anxious Restrictions Weight Bearing Restrictions: No    Mobility  Bed Mobility               General bed mobility comments: Sitting on BSC upon PT arrival.  Transfers Overall transfer level: Needs assistance Equipment used: Rolling walker (2 wheeled) Transfers: Sit to/from Stand Sit to Stand: Min guard Stand pivot transfers: Min guard       General transfer comment: Min guard for safety. SPT BSC to EOB with min guard for safety as pt with LOB posteriorly but able to catch self. Cues for hand placement.  Ambulation/Gait Ambulation/Gait assistance: Min guard Gait Distance (Feet): 75 Feet Assistive device: Rolling walker (2 wheeled) Gait Pattern/deviations: Step-through pattern;Decreased stride length;Trunk flexed Gait velocity: decreased   General Gait Details: Slow, mildly unsteady gait with assist for balance. 2/4 DOE. Sp02 stayed >90% on 2L/min 02.    Stairs             Wheelchair Mobility    Modified Rankin (Stroke Patients Only)       Balance Overall balance assessment: Needs assistance Sitting-balance support: Feet supported;Single extremity supported Sitting balance-Leahy Scale: Fair Sitting balance - Comments: Able to perform pericare without assist.   Standing balance support: During functional activity;Bilateral upper extremity supported Standing balance-Leahy Scale: Fair Standing balance comment: Requires at least 1 UE support for dynamic standing tasks. LOB during transfer but able to recover                            Cognition Arousal/Alertness: Awake/alert Behavior During Therapy: Anxious Overall Cognitive Status: Within Functional Limits for tasks assessed                                 General Comments: Self limiting      Exercises      General Comments        Pertinent Vitals/Pain Pain Assessment: No/denies pain    Home Living                      Prior Function            PT Goals (current goals can now be found in the care plan section) Progress towards PT goals: Progressing toward goals    Frequency    Min 3X/week      PT Plan Current plan remains appropriate    Co-evaluation  AM-PAC PT "6 Clicks" Daily Activity  Outcome Measure  Difficulty turning over in bed (including adjusting bedclothes, sheets and blankets)?: A Little Difficulty moving from lying on back to sitting on the side of the bed? : A Little Difficulty sitting down on and standing up from a chair with arms (e.g., wheelchair, bedside commode, etc,.)?: A Little Help needed moving to and from a bed to chair (including a wheelchair)?: A Little Help needed walking in hospital room?: A Little Help needed climbing 3-5 steps with a railing? : A Little 6 Click Score: 18    End of Session Equipment Utilized During Treatment: Gait belt;Oxygen Activity Tolerance: Patient  limited by fatigue Patient left: in bed;with call bell/phone within reach;with bed alarm set Nurse Communication: Mobility status PT Visit Diagnosis: Muscle weakness (generalized) (M62.81);Unsteadiness on feet (R26.81);Difficulty in walking, not elsewhere classified (R26.2)     Time: 1856-3149 PT Time Calculation (min) (ACUTE ONLY): 14 min  Charges:  $Therapeutic Exercise: 8-22 mins                     Wray Kearns, PT, DPT Acute Rehabilitation Services Pager [phone] Office [place] 09/01/2018, 11:39 AM

## 2018-09-01 NOTE — Progress Notes (Addendum)
Progress Note  Patient Name: [name] Date of Encounter: 09/01/2018  Primary Cardiologist: [facility]  Primary Electrophysiologist:  NA   Patient Profile     83 y.o. female  Admitted with SOB and seen 10/11 for CP and + Tn (.21)  in setting of known CAD and O2 dependent COPD, currently w/ infiltrate  Pt declines cath  Subjective   Pt w/ CP, has been continuous, does not think it is worse w/ deep inspiration, but chest wall is tender. Describes orthopnea, ++DOE Says she is going home today  Inpatient Medications    Scheduled Meds: . amLODipine  5 mg Oral BID  . arformoterol  15 mcg Nebulization BID  . aspirin  81 mg Oral Daily  . budesonide  0.25 mg Nebulization BID  . busPIRone  5 mg Oral TID  . carvedilol  6.25 mg Oral BID WC  . cholecalciferol  1,000 Units Oral Daily  . doxycycline  100 mg Oral Q12H  . enoxaparin (LOVENOX) injection  40 mg Subcutaneous Q24H  . furosemide  40 mg Intravenous Daily  . gabapentin  300 mg Oral Q6H  . ipratropium-albuterol  3 mL Nebulization BID  . isosorbide mononitrate  120 mg Oral Daily  . lubiprostone  24 mcg Oral BID WC  . polyethylene glycol  17 g Oral Daily  . senna-docusate  2 tablet Oral BID  . ticagrelor  90 mg Oral BID   Continuous Infusions:  PRN Meds: acetaminophen, ALPRAZolam, amitriptyline, dicyclomine, fluticasone, guaiFENesin, hydrALAZINE, lactulose, morphine injection, MUSCLE RUB, nitroGLYCERIN, phenol   Vital Signs    Vitals:   09/01/18 0445 09/01/18 0446 09/01/18 0847 09/01/18 0924  BP: (!) 166/75  (!) 167/70   Pulse: 70  79   Resp: (!) 21     Temp: 98.5 F (36.9 C)     TempSrc:      SpO2: 96%   98%  Weight:  88.1 kg    Height:        Intake/Output Summary (Last 24 hours) at 09/01/2018 1013 Last data filed at 09/01/2018 0700 Gross per 24 hour  Intake 582 ml  Output 2600 ml  Net -2018 ml   Filed Weights   08/30/18 0553 08/31/18 0434 09/01/18 0446  Weight: 90.4 kg 88.7 kg 88.1 kg    Telemetry    Sinus - Personally Reviewed  ECG    10/12, SR, HR 74, inc RBBB, no acute ischemic changes, no pathologic Q waves - Personally Reviewed  Physical Exam   General: Well developed, elderly, female in no acute distress Head: Eyes PERRLA, No xanthomas.   Normocephalic and atraumatic, normal for age Lungs: bilateral rales, bronchial BS upper R; +chest wall mid-sternal tenderness Heart: HRRR S1 S2, without RG.soft SEM,  Pulses are 2+ & equal. Mild JVD. Abdomen: Bowel sounds are present, abdomen soft and diffusely tender without masses or  hernias noted. Msk: Normal strength and tone for age. Extremities: No clubbing, cyanosis or edema.    Skin:  No rashes or lesions noted. Neuro: Alert and oriented X 2. Psych:  Good affect, anxious at times    Labs    Chemistry Recent Labs  Lab 08/27/18 1224 08/28/18 0312  08/30/18 0348 08/31/18 0402 09/01/18 0429  NA 140 141   < > 138 138 140  K 2.8* 3.7   < > 4.4 4.5 3.7  CL 99 101   < > 101 99 97*  CO2 27 31   < > 29 29 30   GLUCOSE 106*  105*   < > 109* 108* 129*  BUN 15 20   < > 21 27* 33*  CREATININE 1.12* 1.24*   < > 1.00 1.25* 1.29*  CALCIUM 9.8 8.9   < > 9.5 9.7 9.4  PROT 6.6 5.9*  --   --   --   --   ALBUMIN 3.7 3.4*  --   --   --   --   AST 21 18  --   --   --   --   ALT 13 13  --   --   --   --   ALKPHOS 49 42  --   --   --   --   BILITOT 0.4 0.4  --   --   --   --   GFRNONAA 45* 40*   < > 51* 39* 38*  GFRAA 52* 46*   < > 60* 45* 44*  ANIONGAP 14 9   < > 8 10 13    < > = values in this interval not displayed.     Hematology Recent Labs  Lab 08/30/18 0348 08/31/18 0402 09/01/18 0429  WBC 11.3* 8.5 8.8  RBC 4.65 4.77 4.67  HGB 13.6 13.6 13.5  HCT 43.2 43.9 43.0  MCV 92.9 92.0 92.1  MCH 29.2 28.5 28.9  MCHC 31.5 31.0 31.4  RDW 11.9 11.9 12.1  PLT 220 224 219    Cardiac Enzymes Recent Labs  Lab 08/27/18 1948 08/28/18 0312 08/28/18 0726  TROPONINI 0.21* 0.15* 0.12*    Recent Labs  Lab 08/27/18 1259  TROPIPOC  0.09*     BNP Recent Labs  Lab 08/30/18 0348  BNP 254.1*     Radiology    Chest Xray personally reviewed  >> inflitrate   Cardiac Studies   ECHO: 08/28/2018 - Left ventricle: The cavity size was normal. Wall thickness was   increased in a pattern of moderate LVH. Systolic function was   normal. The estimated ejection fraction was in the range of 60%   to 65%. Wall motion was normal; there were no regional wall   motion abnormalities. Doppler parameters are consistent with   abnormal left ventricular relaxation (grade 1 diastolic   dysfunction). The E/e&' ratio is between 8-15, suggesting   indeterminate LV filling pressure. - Mitral valve: Calcified annulus. Mildly thickened leaflets .   There was trivial regurgitation. - Left atrium: The atrium was mildly dilated. - Inferior vena cava: The vessel was normal in size. The   respirophasic diameter changes were in the normal range (>= 50%),   consistent with normal central venous pressure.  Impressions:  - Compared to a prior study in 2018, there are no significant   changes.  Assessment & Plan     Principal Problem: 1.  NSTEMI (non-ST elevated myocardial infarction) ([place]) - med rx w/ ASA, BB; Brilinta started 10/13 - also on high-dose Imdur - no statin due to allery  - EF normal by echo  Active Problems: 2.   Hyperlipemia - Goal LDL < 70 -  Rxn to Lipitor was hives, no statin  3.  HTN (hypertension) - BP still very elevated, had am meds - amlodipine added 10/14, at max dose and BP still > 160 - no bradycardia, increase Coreg to 12.5 mg bid  4. Acute on Chronic diastolic heart failure - on IV Lasix  - I/O net neg 4.9 L - wt down 6 lbs - renal function trending up - recheck 2 v CXR, not  much volume overload by exam  Otherwise, per IM   COPD exacerbation (HCC)   Abdominal pain   Constipation   Chest pain   Tobacco abuse   COPD, group D, by GOLD 2017 classification ([place])   Acute kidney injury ([place])    Acute exacerbation of chronic obstructive pulmonary disease (COPD) ([place])   Signed, Rosaria Ferries, PA-C  09/01/2018, 10:13 AM    ---------------------------------------------------------------------------------------------   History and all data above reviewed.  Patient examined.  I agree with the findings as above.  [name] feels better today, would like to go home.  GEN:No acute distress.  Sitting up in chair Neck:No JVD Cardiac:RRR, no murmurs, rubs, or gallops.  Respiratory:Breath sounds to auscultation bilaterally. Few rales  TM:AUQJ, nontender, non-distended  MS:No edema; No deformity. Neuro:Nonfocal  Psych: Normal affect   All available labs, radiology testing, previous records reviewed. Agree with documented assessment and plan of my colleague as stated above with the following additions or changes:  Principal Problem:   NSTEMI (non-ST elevated myocardial infarction) ([place]) Active Problems:   Hyperlipemia   HTN (hypertension)   COPD exacerbation (HCC)   Abdominal pain   Constipation   Chest pain   Tobacco abuse   COPD, group D, by GOLD 2017 classification ([place])   Acute kidney injury ([place])   Acute exacerbation of chronic obstructive pulmonary disease (COPD) ([place])    Plan: as above. Continue medical therapy for nstemi. Will receive IV diuresis with plan for transition to oral homegoing.   Elouise Munroe, MD [facility]

## 2018-09-03 ENCOUNTER — Telehealth: Payer: Self-pay | Admitting: Cardiology

## 2018-09-03 NOTE — Telephone Encounter (Signed)
Routed to Dr. Percival Spanish to review and advise

## 2018-09-03 NOTE — Telephone Encounter (Signed)
Returned call to [employer] at [place] [facility] for [facility] orders per Dr. Percival Spanish.  She will fax over orders to sign.

## 2018-09-03 NOTE — Telephone Encounter (Signed)
New Message:    She would like to know if Dr Percival Spanish would sign orders for [facility] please?

## 2018-09-03 NOTE — Telephone Encounter (Signed)
OK 

## 2018-09-11 DIAGNOSIS — I5033 Acute on chronic diastolic (congestive) heart failure: Secondary | ICD-10-CM

## 2018-09-16 NOTE — Telephone Encounter (Signed)
° °  Alvis Lemmings calling to report patient has refused services. Patient has instructed nurse not to return.

## 2018-09-21 ENCOUNTER — Telehealth: Payer: Self-pay | Admitting: Pulmonary Disease

## 2018-09-21 NOTE — Telephone Encounter (Signed)
Attempted to call patient today regarding results. I have left a voicemail message for pt to return call. X3 We have left three messages for pt to call our office back regarding results. No response at this time, a letter has been placed in mail today for pt to call our office. Mailed letter of communication today. Nothing further needed at this time.    

## 2018-09-29 IMAGING — DX DG ABDOMEN ACUTE W/ 1V CHEST
5 series · 5 of 5 positions shown · non-contrast
Comparison: 12/26/2016 .

CLINICAL DATA: Constipation.

EXAM:
DG ABDOMEN ACUTE W/ 1V CHEST

[chest pa]
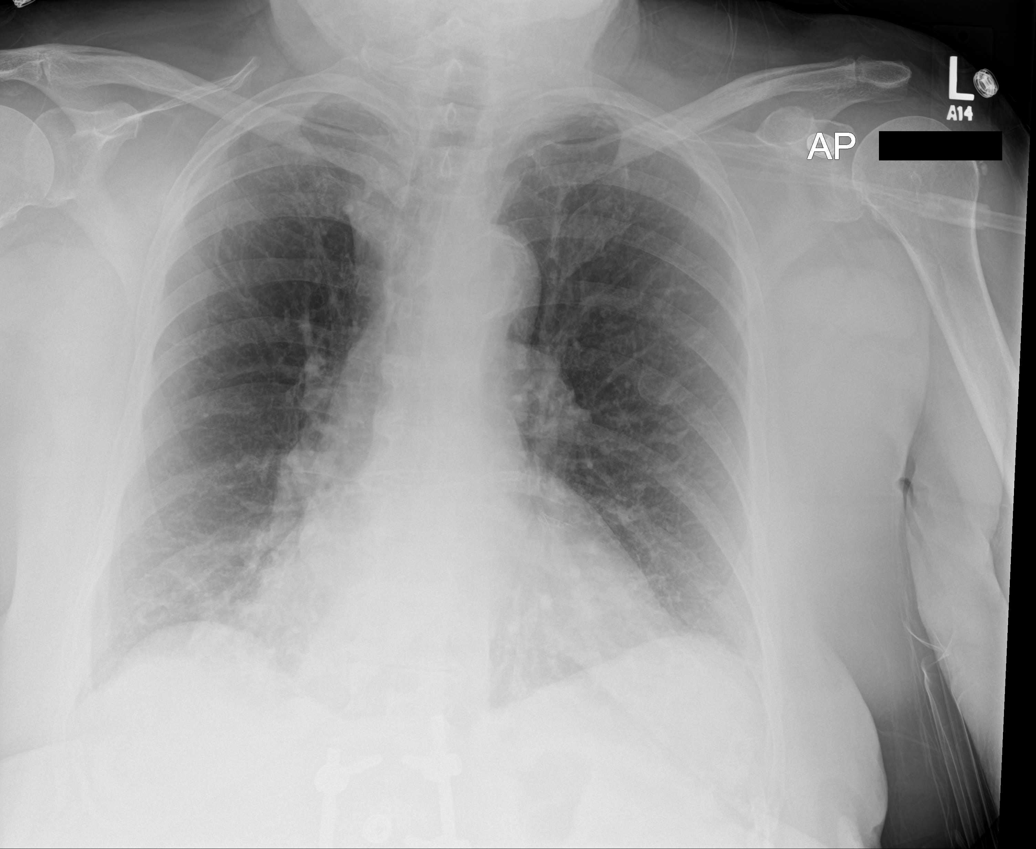

[abdomen supine (1 of 2)]
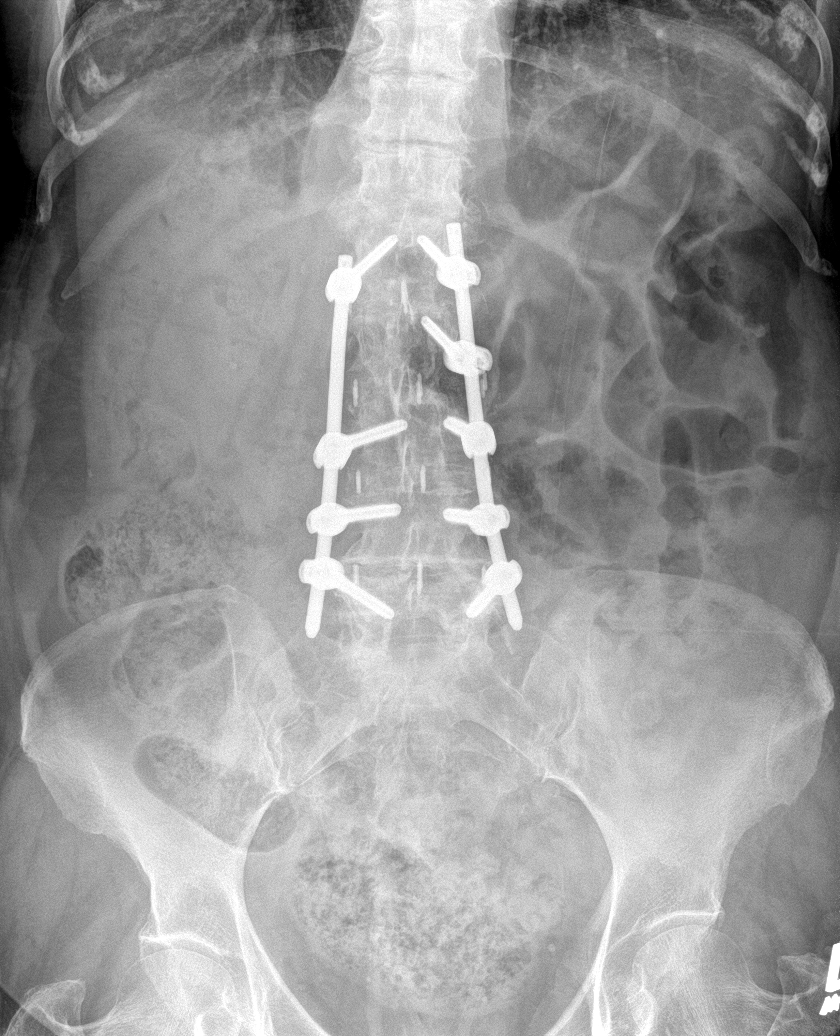

[abdomen supine (2 of 2)]
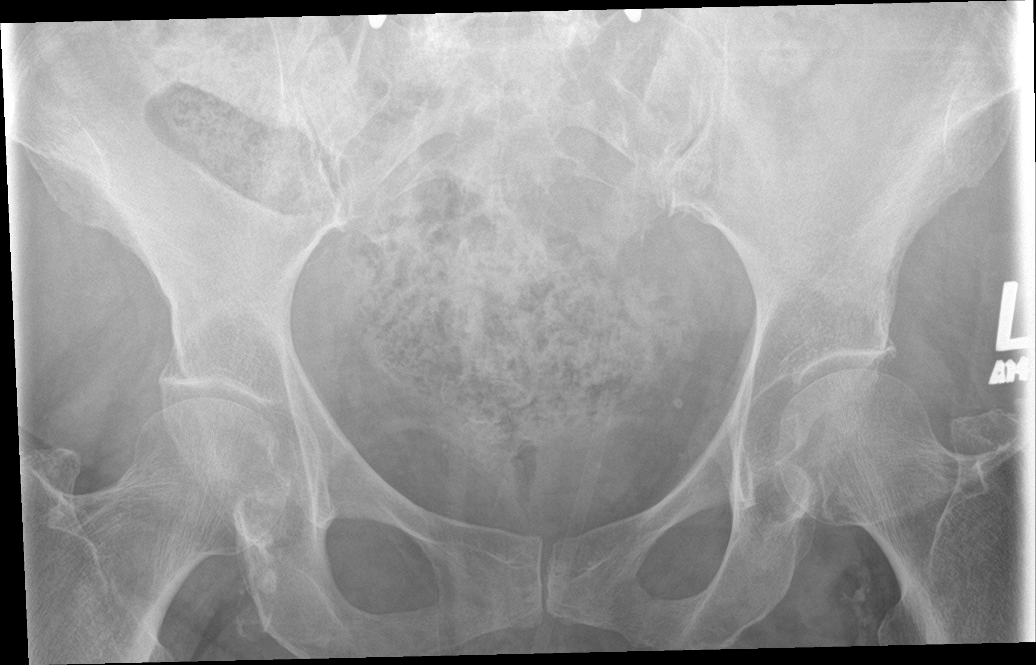

[abdomen decu (1 of 2)]
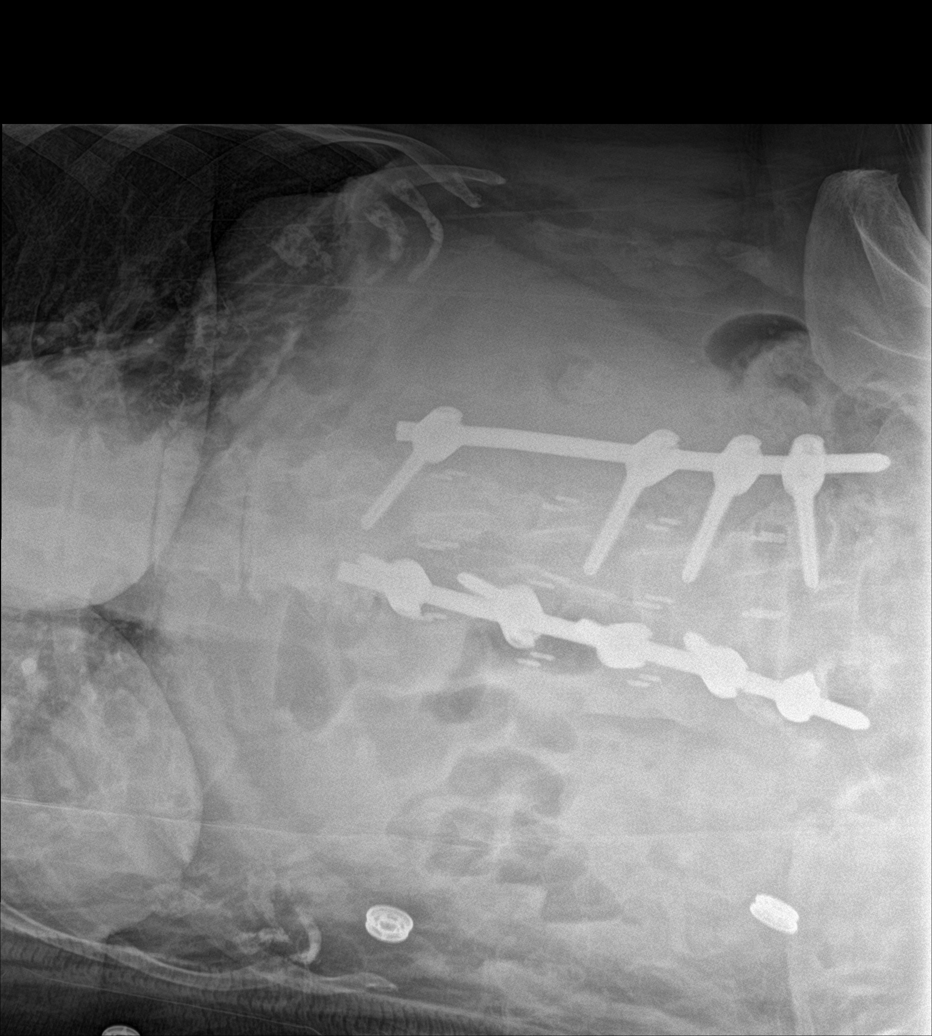

[abdomen decu (2 of 2)]
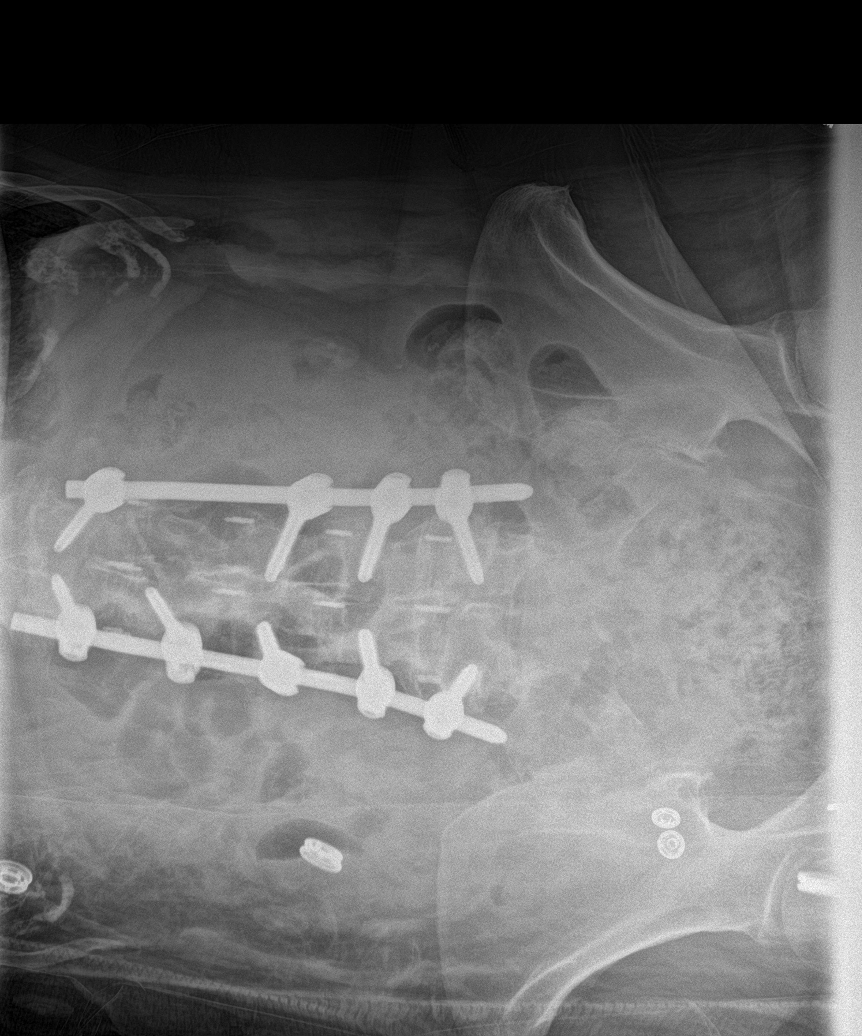

[5 of 5 positions shown; findings below may reference images not displayed]

FINDINGS: Mediastinum hilar structures normal. Cardiomegaly. Normal pulmonary
vascularity. Mild right base infiltrate cannot be excluded. No
pleural effusion or pneumothorax.

Soft tissue structures of the abdomen are unremarkable. Prominent
stool volume. Constipation cannot be excluded . Nonspecific
air-filled loops of small bowel noted. No prominent bowel distention
or free air. Prior lumbar spine fusion .
IMPRESSION: 1.  Prominent stool volume.  Constipation cannot be excluded.

2. Cardiomegaly.  Mild right base infiltrate cannot be excluded .

## 2018-11-24 ENCOUNTER — Emergency Department (HOSPITAL_COMMUNITY)
Admission: EM | Admit: 2018-11-24 | Discharge: 2018-11-24 | Disposition: A | Discharge status: Home / Self Care | Payer: Medicare Other | Attending: Emergency Medicine | Admitting: Emergency Medicine

## 2018-11-24 ENCOUNTER — Emergency Department (HOSPITAL_COMMUNITY): Payer: Medicare Other

## 2018-11-24 ENCOUNTER — Encounter (HOSPITAL_COMMUNITY): Payer: Self-pay | Admitting: Emergency Medicine

## 2018-11-24 DIAGNOSIS — Z7982 Long term (current) use of aspirin: Secondary | ICD-10-CM | POA: Diagnosis not present

## 2018-11-24 DIAGNOSIS — R1084 Generalized abdominal pain: Secondary | ICD-10-CM | POA: Diagnosis not present

## 2018-11-24 DIAGNOSIS — I5033 Acute on chronic diastolic (congestive) heart failure: Secondary | ICD-10-CM | POA: Insufficient documentation

## 2018-11-24 DIAGNOSIS — J449 Chronic obstructive pulmonary disease, unspecified: Secondary | ICD-10-CM | POA: Insufficient documentation

## 2018-11-24 DIAGNOSIS — I110 Hypertensive heart disease with heart failure: Secondary | ICD-10-CM | POA: Insufficient documentation

## 2018-11-24 DIAGNOSIS — Z79899 Other long term (current) drug therapy: Secondary | ICD-10-CM | POA: Diagnosis not present

## 2018-11-24 DIAGNOSIS — Z87891 Personal history of nicotine dependence: Secondary | ICD-10-CM | POA: Diagnosis not present

## 2018-11-24 DIAGNOSIS — R51 Headache: Secondary | ICD-10-CM | POA: Insufficient documentation

## 2018-11-24 LAB — URINALYSIS, ROUTINE W REFLEX MICROSCOPIC
Bilirubin Urine: NEGATIVE
GLUCOSE, UA: NEGATIVE mg/dL
Hgb urine dipstick: NEGATIVE
Ketones, ur: NEGATIVE mg/dL
LEUKOCYTES UA: NEGATIVE
Nitrite: NEGATIVE
PH: 7 (ref 5.0–8.0)
Protein, ur: NEGATIVE mg/dL
Specific Gravity, Urine: 1.004 — ABNORMAL LOW (ref 1.005–1.030)

## 2018-11-24 LAB — CBC WITH DIFFERENTIAL/PLATELET
Abs Immature Granulocytes: 0.03 10*3/uL (ref 0.00–0.07)
BASOS PCT: 0 %
Basophils Absolute: 0 10*3/uL (ref 0.0–0.1)
EOS ABS: 0.1 10*3/uL (ref 0.0–0.5)
Eosinophils Relative: 2 %
HEMATOCRIT: 38.7 % (ref 36.0–46.0)
Hemoglobin: 11.7 g/dL — ABNORMAL LOW (ref 12.0–15.0)
IMMATURE GRANULOCYTES: 0 %
LYMPHS ABS: 1.2 10*3/uL (ref 0.7–4.0)
Lymphocytes Relative: 17 %
MCH: 29.1 pg (ref 26.0–34.0)
MCHC: 30.2 g/dL (ref 30.0–36.0)
MCV: 96.3 fL (ref 80.0–100.0)
MONO ABS: 0.5 10*3/uL (ref 0.1–1.0)
MONOS PCT: 7 %
NEUTROS PCT: 74 %
Neutro Abs: 5.3 10*3/uL (ref 1.7–7.7)
PLATELETS: 221 10*3/uL (ref 150–400)
RBC: 4.02 MIL/uL (ref 3.87–5.11)
RDW: 13 % (ref 11.5–15.5)
WBC: 7.2 10*3/uL (ref 4.0–10.5)
nRBC: 0 % (ref 0.0–0.2)

## 2018-11-24 LAB — COMPREHENSIVE METABOLIC PANEL
ALBUMIN: 3.5 g/dL (ref 3.5–5.0)
ALT: 13 U/L (ref 0–44)
AST: 17 U/L (ref 15–41)
Alkaline Phosphatase: 47 U/L (ref 38–126)
Anion gap: 8 (ref 5–15)
BUN: 14 mg/dL (ref 8–23)
CHLORIDE: 101 mmol/L (ref 98–111)
CO2: 31 mmol/L (ref 22–32)
Calcium: 9.2 mg/dL (ref 8.9–10.3)
Creatinine, Ser: 1.18 mg/dL — ABNORMAL HIGH (ref 0.44–1.00)
GFR calc Af Amer: 50 mL/min — ABNORMAL LOW (ref >60)
GFR calc non Af Amer: 43 mL/min — ABNORMAL LOW (ref >60)
GLUCOSE: 151 mg/dL — AB (ref 70–99)
POTASSIUM: 3.8 mmol/L (ref 3.5–5.1)
Sodium: 140 mmol/L (ref 135–145)
Total Bilirubin: 0.6 mg/dL (ref 0.3–1.2)
Total Protein: 6.4 g/dL — ABNORMAL LOW (ref 6.5–8.1)

## 2018-11-24 LAB — I-STAT TROPONIN, ED: Troponin i, poc: 0.01 ng/mL (ref 0.00–0.08)

## 2018-11-24 LAB — BRAIN NATRIURETIC PEPTIDE: B NATRIURETIC PEPTIDE 5: 105.9 pg/mL — AB (ref 0.0–100.0)

## 2018-11-24 LAB — I-STAT CG4 LACTIC ACID, ED: Lactic Acid, Venous: 1.51 mmol/L (ref 0.5–1.9)

## 2018-11-24 LAB — LIPASE, BLOOD: Lipase: 29 U/L (ref 11–51)

## 2018-11-24 MED ORDER — ACETAMINOPHEN 325 MG PO TABS
650.0000 mg | ORAL_TABLET | Freq: Once | ORAL | Status: AC
  Administered 2018-11-24: 650 mg via ORAL
  Filled 2018-11-24: qty 2

## 2018-11-24 MED ORDER — IPRATROPIUM-ALBUTEROL 0.5-2.5 (3) MG/3ML IN SOLN
3.0000 mL | Freq: Once | RESPIRATORY_TRACT | Status: AC
  Administered 2018-11-24: 3 mL via RESPIRATORY_TRACT
  Filled 2018-11-24: qty 3

## 2018-11-24 NOTE — ED Triage Notes (Signed)
Pt here from home with c/o of generalized pain , mostly c/o abd pain, no n/v

## 2018-11-24 NOTE — Discharge Instructions (Addendum)
Return here as needed. Your testing does not show any significant abnormalities

## 2018-11-24 NOTE — ED Provider Notes (Signed)
[facility] EMERGENCY DEPARTMENT Provider Note   CSN: [id number] Arrival date & time: 11/24/18  0347     History   Chief Complaint Chief Complaint  Patient presents with  . Abdominal Pain    HPI [name] is a 84 y.o. female.  84 y.o female with a PMH of CAD, COPD, GERD, HTN, STEMI presents to the ED with a chief complaint of abdominal pain since yesterday.  Shin symptoms are very vague, reports she is also had a headache along with some dizziness and bilateral ear pain.  She reports "I hear my heart beating in both of my ears". Has not tried any medical therapy for relieve in symptoms. She reports living at home with sister who is not then during this encounter.  Unable to provide further history as patient is very vague in symptoms.     Past Medical History:  Diagnosis Date  . Arthritis    "in my spine" (04/15/2018)  . Blurred vision   . CAD (coronary artery disease)    Multiple percutaneous revascularization. Catheterization May 2013 left main normal, patent LAD stent, 90% circumflex stenosis, patent right coronary artery stents. Patient had a DES to the circumflex. The EF was well-preserved.  . Chronic lower back pain   . COPD (chronic obstructive pulmonary disease) ([place])   . Fatigue   . GERD (gastroesophageal reflux disease)   . Hard of hearing   . HTN (hypertension)   . Hx of radiation therapy    right nose 4500 cGy in 10 sessions over 5 weeks (2 treatments a week)  . Hyperlipemia   . NSTEMI (non-ST elevated myocardial infarction) ([place]) 08/28/2018  . On home oxygen therapy    "2L; 24/7" (08/27/2018)  . Pneumonia    "I've had a touch of it" (04/15/2018)  . Squamous cell carcinoma of nose    right    Patient Active Problem List   Diagnosis Date Noted  . Acute on chronic diastolic heart failure ([place])   . NSTEMI (non-ST elevated myocardial infarction) ([place]) 08/28/2018  . Acute kidney injury ([place]) 08/28/2018  . Acute exacerbation of chronic  obstructive pulmonary disease (COPD) ([place]) 08/28/2018  . GERD (gastroesophageal reflux disease) 04/14/2018  . Back pain 04/14/2018  . Right flank pain 04/14/2018  . COPD, group D, by GOLD 2017 classification ([place]) 10/06/2017  . Depression 10/05/2017  . Rectal pain 10/05/2017  . AP (abdominal pain)   . Atypical chest pain 11/28/2014  . Chest pain 11/28/2014  . Tobacco abuse 11/28/2014  . Skin cancer of nose   . Squamous cell carcinoma of skin of other and unspecified parts of face 10/15/2013  . COPD exacerbation ([place]) 01/30/2013  . Abdominal pain 01/30/2013  . Constipation 01/30/2013  . Hypokalemia 01/30/2013  . Leukocytosis 05/14/2012  . HTN (hypertension) 05/14/2012  . Progressive angina ([place]) 05/07/2012  . Hyperlipemia   . Dysphagia, unspecified(787.20) 02/04/2012  . Esophageal reflux 02/04/2012  . Coronary atherosclerosis 08/28/2010  . ANAL OR RECTAL PAIN 08/28/2010  . NAUSEA ALONE 08/28/2010    Past Surgical History:  Procedure Laterality Date  . ABDOMINAL HYSTERECTOMY    . BACK SURGERY  ?  . CORONARY ANGIOPLASTY WITH STENT PLACEMENT  2002-2009  . CORONARY ANGIOPLASTY WITH STENT PLACEMENT  05/06/12   "1; this makes 5"  . DILATION AND CURETTAGE OF UTERUS    . ESOPHAGEAL DILATION  ~ 2012   Dr. Deatra Ina  . LUMBAR SPINE SURGERY  04/2011   Rods, screws and cages  .  PERCUTANEOUS CORONARY STENT INTERVENTION (PCI-S) N/A 05/06/2012   Procedure: PERCUTANEOUS CORONARY STENT INTERVENTION (PCI-S);  Surgeon: Burnell Blanks, MD;  Location: [facility] CATH LAB;  Service: Cardiovascular;  Laterality: N/A;  . SKIN BIOPSY  10/15/13   right nose-microinvasive squamous cell carcinoma  . TUBAL LIGATION       OB History   No obstetric history on file.      Home Medications    Prior to Admission medications   Medication Sig Start Date End Date Taking? Authorizing Provider  acetaminophen (TYLENOL 8 HOUR) 650 MG CR tablet Take 1 tablet (650 mg total) by mouth every 8 (eight) hours as  needed. Patient taking differently: Take 650 mg by mouth every 8 (eight) hours as needed for pain or fever.  03/16/18  Yes Varney Biles, MD  albuterol (PROVENTIL HFA;VENTOLIN HFA) 108 (90 BASE) MCG/ACT inhaler Inhale 2 puffs into the lungs every 4 (four) hours as needed for wheezing. 12/01/14  Yes Delfina Redwood, MD  ALPRAZolam Duanne Moron) 0.25 MG tablet Take 1 tablet (0.25 mg total) by mouth 2 (two) times daily as needed for anxiety or sleep. 09/01/18  Yes Emokpae, Courage, MD  amitriptyline (ELAVIL) 10 MG tablet Take 10-20 mg by mouth at bedtime as needed (depression).    Yes [provider]  amLODipine (NORVASC) 5 MG tablet Take 1 tablet (5 mg total) by mouth daily. 09/01/18  Yes Emokpae, Courage, MD  arformoterol (BROVANA) 15 MCG/2ML NEBU Take 2 mLs (15 mcg total) by nebulization 2 (two) times daily. 11/28/17  Yes Mannam, Praveen, MD  aspirin 81 MG tablet Take 1 tablet (81 mg total) by mouth daily. With food 09/01/18  Yes Emokpae, Courage, MD  budesonide (PULMICORT) 0.25 MG/2ML nebulizer solution Take 2 mLs (0.25 mg total) by nebulization 2 (two) times daily. Patient taking differently: Take 0.25 mg by nebulization 4 (four) times daily.  11/28/17 11/28/18 Yes Mannam, Praveen, MD  busPIRone (BUSPAR) 10 MG tablet Take 1 tablet (10 mg total) by mouth 2 (two) times daily. 09/01/18  Yes Roxan Hockey, MD  carvedilol (COREG) 12.5 MG tablet Take 1 tablet (12.5 mg total) by mouth 2 (two) times daily with a meal. 09/01/18  Yes Emokpae, Courage, MD  cholecalciferol (VITAMIN D) 1000 UNITS tablet Take 1,000 Units by mouth daily.    Yes [provider]  dicyclomine (BENTYL) 20 MG tablet Take 1 tablet (20 mg total) by mouth 3 (three) times daily as needed for spasms. Cramping abdominal pain. Patient taking differently: Take 20 mg by mouth 3 (three) times daily as needed (cramping abdominal pain).  08/16/17  Yes Charlesetta Shanks, MD  fluticasone (FLONASE) 50 MCG/ACT nasal spray Place 2 sprays  into both nostrils daily as needed for allergies or rhinitis. 10/12/17  Yes Cherene Altes, MD  furosemide (LASIX) 20 MG tablet Take 2 tablets (40 mg total) by mouth daily. 09/01/18 09/01/19 Yes Emokpae, Courage, MD  gabapentin (NEURONTIN) 100 MG capsule Take 1 capsule (100 mg total) by mouth 2 (two) times daily as needed (pain). 09/01/18  Yes Emokpae, Courage, MD  guaiFENesin (MUCINEX) 600 MG 12 hr tablet Take 1 tablet (600 mg total) by mouth 2 (two) times daily. 09/01/18  Yes Emokpae, Courage, MD  hydrALAZINE (APRESOLINE) 25 MG tablet Take 1 tablet (25 mg total) by mouth 3 (three) times daily. 09/01/18 09/01/19 Yes Emokpae, Courage, MD  isosorbide mononitrate (IMDUR) 120 MG 24 hr tablet Take 1 tablet (120 mg total) by mouth daily. 03/27/18  Yes Minus Breeding, MD  lubiprostone Baker Pierini)  24 MCG capsule Take 1 capsule (24 mcg total) by mouth 2 (two) times daily with a meal. 12/23/17  Yes Nandigam, Venia Minks, MD  nitroGLYCERIN (NITROSTAT) 0.4 MG SL tablet Place 0.4 mg under the tongue every 5 (five) minutes as needed for chest pain.   Yes [provider]  polyethylene glycol (MIRALAX) packet Take 17 g by mouth daily as needed for mild constipation or moderate constipation. 09/01/18  Yes Emokpae, Courage, MD  ranitidine (ZANTAC) 300 MG tablet Take 0.5 tablets (150 mg total) by mouth 2 (two) times daily. 02/21/18  Yes Ledell Noss, MD  Revefenacin (YUPELRI) 175 MCG/3ML SOLN Inhale 3 mLs into the lungs daily. J44.9 08/20/18  Yes Mannam, Praveen, MD  senna-docusate (SENOKOT-S) 8.6-50 MG tablet Take 2 tablets by mouth 2 (two) times daily. 09/01/18  Yes Emokpae, Courage, MD  ticagrelor (BRILINTA) 90 MG TABS tablet Take 1 tablet (90 mg total) by mouth 2 (two) times daily. 09/01/18  Yes Emokpae, Courage, MD  trolamine salicylate (ASPERCREME) 10 % cream Apply 1 application topically daily as needed for muscle pain.   Yes [provider]  calcium carbonate (OS-CAL) 600 MG TABS Take 600 mg by mouth  daily.    02/04/12  [provider]  pravastatin (PRAVACHOL) 40 MG tablet Take 40 mg by mouth daily.    02/04/12  [provider]    Family History Family History  Problem Relation Age of Onset  . Glaucoma Father   . Heart attack Father   . Bone cancer Mother     Social History Social History   Tobacco Use  . Smoking status: Former Smoker    Packs/day: 0.50    Years: 55.00    Pack years: 27.50    Types: Cigarettes    Last attempt to quit: 11/28/2012    Years since quitting: 5.9  . Smokeless tobacco: Never Used  Substance Use Topics  . Alcohol use: Never    Alcohol/week: 0.0 standard drinks    Frequency: Never    Comment: "drank one time; never again"  . Drug use: Never     Allergies   Iohexol and Lipitor [atorvastatin calcium]   Review of Systems Review of Systems  Constitutional: Negative for chills and fever.  HENT: Negative for ear pain and sore throat.   Eyes: Negative for pain and visual disturbance.  Respiratory: Negative for cough and shortness of breath.   Cardiovascular: Negative for chest pain and palpitations.  Gastrointestinal: Positive for abdominal pain. Negative for diarrhea, nausea and vomiting.  Genitourinary: Negative for dysuria and hematuria.  Musculoskeletal: Negative for arthralgias and back pain.  Skin: Negative for color change and rash.  Neurological: Negative for seizures and syncope.  All other systems reviewed and are negative.    Physical Exam Updated Vital Signs BP (!) 129/58   Pulse (!) 53   Temp 97.6 F (36.4 C) (Oral)   Resp 16   SpO2 100%   Physical Exam Vitals signs and nursing note reviewed.  Constitutional:      General: She is not in acute distress.    Appearance: She is well-developed.     Comments: Teary, clear discharge appreciated from bilateral eyes.  HENT:     Head: Normocephalic and atraumatic.     Mouth/Throat:     Pharynx: No oropharyngeal exudate.  Eyes:     Conjunctiva/sclera:      Right eye: Right conjunctiva is injected.     Left eye: Left conjunctiva is injected.  Pupils: Pupils are equal, round, and reactive to light.  Neck:     Musculoskeletal: Normal range of motion.  Cardiovascular:     Rate and Rhythm: Regular rhythm.     Heart sounds: Normal heart sounds.  Pulmonary:     Effort: Pulmonary effort is normal. No respiratory distress.     Breath sounds: Decreased breath sounds and wheezing present.  Abdominal:     General: Bowel sounds are normal. There is no distension.     Palpations: Abdomen is soft.     Tenderness: There is generalized abdominal tenderness and tenderness in the epigastric area. There is no right CVA tenderness or left CVA tenderness.     Comments: Epigastric and generalized abdominal tenderness to palpation.  Musculoskeletal:        General: No tenderness or deformity.     Right lower leg: No edema.     Left lower leg: No edema.  Skin:    General: Skin is warm and dry.  Neurological:     Mental Status: She is alert and oriented to person, place, and time.      ED Treatments / Results  Labs (all labs ordered are listed, but only abnormal results are displayed) Labs Reviewed  CBC WITH DIFFERENTIAL/PLATELET - Abnormal; Notable for the following components:      Result Value   Hemoglobin 11.7 (*)    All other components within normal limits  COMPREHENSIVE METABOLIC PANEL - Abnormal; Notable for the following components:   Glucose, Bld 151 (*)    Creatinine, Ser 1.18 (*)    Total Protein 6.4 (*)    GFR calc non Af Amer 43 (*)    GFR calc Af Amer 50 (*)    All other components within normal limits  URINALYSIS, ROUTINE W REFLEX MICROSCOPIC - Abnormal; Notable for the following components:   Color, Urine STRAW (*)    Specific Gravity, Urine 1.004 (*)    All other components within normal limits  BRAIN NATRIURETIC PEPTIDE - Abnormal; Notable for the following components:   B Natriuretic Peptide 105.9 (*)    All other  components within normal limits  LIPASE, BLOOD  I-STAT CG4 LACTIC ACID, ED  I-STAT TROPONIN, ED    EKG EKG Interpretation  Date/Time:  Tuesday November 24 2018 10:17:27 EST Ventricular Rate:  49 PR Interval:    QRS Duration: 123 QT Interval:  508 QTC Calculation: 459 R Axis:   87 Text Interpretation:  Sinus bradycardia Right bundle branch block Baseline wander in lead(s) V5 T wave inversion AVL new since Oct 2019 but present on some previous ECGs Confirmed by Sherwood Gambler [phone]) on 11/24/2018 11:19:10 AM   Radiology Ct Head Wo Contrast  Result Date: 11/24/2018 CLINICAL DATA:  84 year old female with generalized pain. Initial encounter. EXAM: CT HEAD WITHOUT CONTRAST TECHNIQUE: Contiguous axial images were obtained from the base of the skull through the vertex without intravenous contrast. COMPARISON:  10/31/2004 MR. FINDINGS: Brain: No intracranial hemorrhage or CT evidence of large acute infarct. 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 Mild chronic microvascular changes. Mild global atrophy. No intracranial mass lesion noted on this unenhanced exam. Vascular: Vascular calcifications.  No acute hyperdense vessel. Skull: No acute abnormality. Sinuses/Orbits: No acute orbital abnormality. Mild mucosal thickening inferior aspect right maxillary sinus. Mild mucosal thickening left frontoethmoidal recess. Other: Opacification mastoid air cells bilaterally and left middle ear. No obstructing lesion identified at level of upper posterior nasopharynx. IMPRESSION: 1. No intracranial hemorrhage or CT evidence of large  acute infarct. 2. 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 Mild chronic microvascular changes. 3. Mild mucosal thickening inferior aspect right maxillary sinus. Mild mucosal thickening left frontoethmoidal recess. 4. Opacified mastoid air cells bilaterally and left middle ear. No obstructing lesion identified at level of upper posterior nasopharynx.  Electronically Signed   By: Genia Del M.D.   On: 11/24/2018 12:54   Dg Chest Portable 1 View  Result Date: 11/24/2018 CLINICAL DATA:  84 year old female with increased O2 requirement. Shortness breath. Initial encounter. EXAM: PORTABLE CHEST 1 VIEW COMPARISON:  09/01/2018 chest x-ray. FINDINGS: Marked cardiomegaly. Calcified mildly tortuous aorta. Increased markings lung bases similar to prior exam and may represent scarring/atelectasis. No new segmental consolidation noted. Central pulmonary vasculature slightly prominent similar to prior exam without evidence pulmonary edema. No pneumothorax. IMPRESSION: 1. Cardiomegaly. 2. Increased markings lung bases similar to prior exam and may represent scarring/atelectasis. No new segmental consolidation noted. 3.  Aortic Atherosclerosis (ICD10-I70.0). Electronically Signed   By: Genia Del M.D.   On: 11/24/2018 10:25    Procedures Procedures (including critical care time)  Medications Ordered in ED Medications  ipratropium-albuterol (DUONEB) 0.5-2.5 (3) MG/3ML nebulizer solution 3 mL (3 mLs Nebulization Given 11/24/18 1055)  acetaminophen (TYLENOL) tablet 650 mg (650 mg Oral Given 11/24/18 1200)     Initial Impression / Assessment and Plan / ED Course  I have reviewed the triage vital signs and the nursing notes.  Pertinent labs & imaging results that were available during my care of the patient were reviewed by me and considered in my medical decision making (see chart for details).   Presents from nursing facility, reporting abdominal pain.  Urine evaluation patient is very vague about her symptoms states she is got bilateral ear ringing and can hear her heart.  CMP showed no electrolyte abnormality, and a slightly elevated but is consistent with patient's previous visits.  CBC showed no leukocytosis, globin is decreased slightly from previous visit at 11.7, she denies any blood in her stool from her last bowel movement 2 days ago.  BNP was 105  within normal limits.  She received a breathing treatment which then gave her headache, provided with Tylenol.  It states relieving headache.  CT head was obtained to rule out any acute process, CT head was negative.  CT abdomen and pelvis without contrast ordered due to patient's allergic reaction to contrast.  4:05 PM Patient care transferred to [facility] pending CT abdomen, I presume disposition if CT is normal.   Final Clinical Impressions(s) / ED Diagnoses   Final diagnoses:  None    ED Discharge Orders    None       Janeece Fitting, PA-C 11/24/18 [place], MD 11/28/18 [phone]

## 2018-12-29 ENCOUNTER — Encounter: Payer: Self-pay | Admitting: Pulmonary Disease

## 2018-12-29 ENCOUNTER — Ambulatory Visit (INDEPENDENT_AMBULATORY_CARE_PROVIDER_SITE_OTHER): Payer: Medicare Other | Admitting: Pulmonary Disease

## 2018-12-29 VITALS — BP 140/68 | HR 52 | Ht 64.0 in | Wt 203.0 lb

## 2018-12-29 DIAGNOSIS — J449 Chronic obstructive pulmonary disease, unspecified: Secondary | ICD-10-CM

## 2018-12-29 NOTE — Progress Notes (Signed)
[name]    [id number]    [birth year]  Primary Care Physician:Elkins, [name], MD  Referring Physician: Leonard Downing, MD [address] [place], [place] [address]  Chief complaint:  Follow up for COPD GOLD D (CAT score 32, 2 hospitalizations over past year)  HPI:  Mrs. [name] is a 84 year old with past history as below. She has history of coronary artery disease with multiple stents. She had a stress test in January 2016 which was negative. She continues to complain of dyspnea and has been referred here by her cardiologist for further evaluation. She was started on supplemental oxygen at 2 L/m for desaturations. Her chief complaints are dyspnea on exertion. She denies any dyspnea at rest, cough, sputum production, wheezing. She has about 30-pack-year smoking history and quit in 2016.  November 2018- She had a hospital admission for dyspnea, abdominal pain.  Chest x-ray at that time does not show any acute process.  Treated for COPD exacerbation with BiPAP, Solu-Medrol.  Evaluated for abdominal pain of unclear etiology with negative CT of the abdomen.  GI evaluation was also non diagnostic.  Admitted in June 2019 for similar complaints of abdominal pain, dyspnea.  CT abdomen shows posterior hernia with fat stranding.  CT surgery did not recommend any intervention.  Treated for COPD exacerbation with steroids.   Interim history Continues on Brovana, Pulmicort.  Reports dyspnea on exertion, cough with mucus production, wheezing  Outpatient Encounter Medications as of 12/29/2018  Medication Sig  . acetaminophen (TYLENOL 8 HOUR) 650 MG CR tablet Take 1 tablet (650 mg total) by mouth every 8 (eight) hours as needed. (Patient taking differently: Take 650 mg by mouth every 8 (eight) hours as needed for pain or fever. )  . albuterol (PROVENTIL HFA;VENTOLIN HFA) 108 (90 BASE) MCG/ACT inhaler Inhale 2 puffs into the lungs every 4 (four) hours as needed for wheezing.    Marland Kitchen amitriptyline (ELAVIL) 10 MG tablet Take 10-20 mg by mouth at bedtime as needed (depression).   Marland Kitchen arformoterol (BROVANA) 15 MCG/2ML NEBU Take 2 mLs (15 mcg total) by nebulization 2 (two) times daily.  Marland Kitchen aspirin 81 MG tablet Take 1 tablet (81 mg total) by mouth daily. With food  . busPIRone (BUSPAR) 10 MG tablet Take 1 tablet (10 mg total) by mouth 2 (two) times daily.  . carvedilol (COREG) 12.5 MG tablet Take 1 tablet (12.5 mg total) by mouth 2 (two) times daily with a meal.  . cholecalciferol (VITAMIN D) 1000 UNITS tablet Take 1,000 Units by mouth daily.   Marland Kitchen dicyclomine (BENTYL) 20 MG tablet Take 1 tablet (20 mg total) by mouth 3 (three) times daily as needed for spasms. Cramping abdominal pain. (Patient taking differently: Take 20 mg by mouth 3 (three) times daily as needed (cramping abdominal pain). )  . fluticasone (FLONASE) 50 MCG/ACT nasal spray Place 2 sprays into both nostrils daily as needed for allergies or rhinitis.  . furosemide (LASIX) 20 MG tablet Take 2 tablets (40 mg total) by mouth daily.  Marland Kitchen gabapentin (NEURONTIN) 100 MG capsule Take 1 capsule (100 mg total) by mouth 2 (two) times daily as needed (pain).  Marland Kitchen guaiFENesin (MUCINEX) 600 MG 12 hr tablet Take 1 tablet (600 mg total) by mouth 2 (two) times daily.  . hydrALAZINE (APRESOLINE) 25 MG tablet Take 1 tablet (25 mg total) by mouth 3 (three) times daily.  . isosorbide mononitrate (IMDUR) 120 MG 24 hr tablet Take 1 tablet (120  mg total) by mouth daily.  Marland Kitchen lubiprostone (AMITIZA) 24 MCG capsule Take 1 capsule (24 mcg total) by mouth 2 (two) times daily with a meal.  . nitroGLYCERIN (NITROSTAT) 0.4 MG SL tablet Place 0.4 mg under the tongue every 5 (five) minutes as needed for chest pain.  . polyethylene glycol (MIRALAX) packet Take 17 g by mouth daily as needed for mild constipation or moderate constipation.  . Revefenacin (YUPELRI) 175 MCG/3ML SOLN Inhale 3 mLs into the lungs daily. J44.9  . senna-docusate (SENOKOT-S) 8.6-50 MG  tablet Take 2 tablets by mouth 2 (two) times daily.  . ticagrelor (BRILINTA) 90 MG TABS tablet Take 1 tablet (90 mg total) by mouth 2 (two) times daily.  Marland Kitchen trolamine salicylate (ASPERCREME) 10 % cream Apply 1 application topically daily as needed for muscle pain.  . budesonide (PULMICORT) 0.25 MG/2ML nebulizer solution Take 2 mLs (0.25 mg total) by nebulization 2 (two) times daily. (Patient taking differently: Take 0.25 mg by nebulization 4 (four) times daily. )  . [DISCONTINUED] ALPRAZolam (XANAX) 0.25 MG tablet Take 1 tablet (0.25 mg total) by mouth 2 (two) times daily as needed for anxiety or sleep.  . [DISCONTINUED] amLODipine (NORVASC) 5 MG tablet Take 1 tablet (5 mg total) by mouth daily.  . [DISCONTINUED] calcium carbonate (OS-CAL) 600 MG TABS Take 600 mg by mouth daily.    . [DISCONTINUED] pravastatin (PRAVACHOL) 40 MG tablet Take 40 mg by mouth daily.    . [DISCONTINUED] ranitidine (ZANTAC) 300 MG tablet Take 0.5 tablets (150 mg total) by mouth 2 (two) times daily.   No facility-administered encounter medications on file as of 12/29/2018.    Physical Exam: Blood pressure 126/68, pulse 74, height 5\' 4"  (1.626 m), weight 201 lb (91.2 kg), SpO2 95 %. Gen:      No acute distress HEENT:  EOMI, sclera anicteric Neck:     No masses; no thyromegaly Lungs:    Scattered crackles, no wheeze. CV:         Regular rate and rhythm; no murmurs Abd:      + bowel sounds; soft, non-tender; no palpable masses, no distension Ext:    No edema; adequate peripheral perfusion Skin:      Warm and dry; no rash Neuro: alert and oriented x 3 Psych: normal mood and affect  Data Reviewed: Imaging Abdominal CT 12/26/16-right diaphragm hernia, and basilar atelectasis or mild scarring Abdominal CT 10/06/17-bibasilar atelectasis Abdominal CT 11/07/17-stable bibasilar atelectasis Abdominal CT 02/24/2018-stable basal atelectasis, right diaphragm hernia. Abdominal CT 04/14/2018- stable lower lung findings. Chest x-ray  11/24/2018-increased lung markings stable compared to prior. I have reviewed the images personally.  PFTs 10/27/16 FVC 1.82 (66%),FEV1 0.92 (44%), F/F 50, TLC 82%, DLCO 19% Severe obstructive airway disease with broncho dilator response Severe diffusion defect  Labs: CBC 02/24/2018-WBC 9.4, eos 1%, absolute eosinophil count 94 CBC 11/24/2018-WBC 7.2, eos 2%, absolute eosinophil count 144 IgE 08/20/2018-88 Alpha-1 antitrypsin 08/20/2018- 177 PI MM  Assessment:  COPD GOLD D Severe COPD with a bronchodilator response.   Continue on Brovana, yupelri and Pulmicort with albuterol as needed Continue supplemental oxygen.   Health maintenance 08/20/2018-influenza She got Pneumovax at her doctor's office.  Plan/Recommendations: - Continue Brovana, pulmicort, yupelri nebs  Return in 6 months.   Marshell Garfinkel MD [facility] 12/29/2018, 4:32 PM  CC: Leonard Downing, *

## 2018-12-29 NOTE — Patient Instructions (Signed)
Continue your nebulizers as prescribed Follow-up in 6 months.

## 2019-03-15 ENCOUNTER — Telehealth: Payer: Self-pay | Admitting: *Deleted

## 2019-03-15 NOTE — Telephone Encounter (Signed)
03/15/19 LMOM @ 0959 am,re: appointment.

## 2019-03-26 ENCOUNTER — Telehealth: Payer: Self-pay | Admitting: Cardiology

## 2019-03-26 NOTE — Telephone Encounter (Signed)
LVM to schedule recall. °

## 2019-04-01 ENCOUNTER — Telehealth: Payer: Self-pay | Admitting: Cardiology

## 2019-04-01 NOTE — Telephone Encounter (Signed)
LVM for patient to call and schedule appt with Dr. Percival Spanish.

## 2019-04-05 NOTE — Progress Notes (Signed)
Virtual Visit via Telephone Note   This visit type was conducted due to national recommendations for restrictions regarding the COVID-19 Pandemic (e.g. social distancing) in an effort to limit this patient's exposure and mitigate transmission in our community.  Due to her co-morbid illnesses, this patient is at least at moderate risk for complications without adequate follow up.  This format is felt to be most appropriate for this patient at this time.  The patient did not have access to video technology/had technical difficulties with video requiring transitioning to audio format only (telephone).  All issues noted in this document were discussed and addressed.  No physical exam could be performed with this format.  Please refer to the patient's chart for her  consent to telehealth for [facility].   Date:  04/06/2019   ID:  [name], DOB [birth year], MRN [id number]  Patient Location: Home Provider Location: Home  PCP:  Leonard Downing, MD  Cardiologist:  Minus Breeding, MD    Evaluation Performed:  Follow-Up Visit  Chief Complaint:  CAD   History of Present Illness:    [name] is a 84 y.o. female for follow up of CAD. She's had a history of coronary disease with multiple stents.   She was in the hospital in October 2018 with pneumonia and she had elevated cardiac enzymes.  She was managed medically.    She has no acute cardiac complaints.  She has many somatic complaints.  She reports a fever but says "I don't have that virus."  She denies fevers or cough.  She has had no exposure by her report.  She says this happens because of her bowels.  She describes what sounds like alternating diarrhea and constipation.    She has elevated BP as below but says that her primary provider stopped her BP meds.  After careful review of the meds it appears that she is not taking the hydralazine.  She denies new chest pain.  She has had no new SOB, PND or orthopnea.  She has had no  new weight gain or edema by her report  The patient does not have symptoms concerning for COVID-19 infection (fever, chills, cough, or new shortness of breath).    Past Medical History:  Diagnosis Date  . Arthritis    "in my spine" (04/15/2018)  . Blurred vision   . CAD (coronary artery disease)    Multiple percutaneous revascularization. Catheterization May 2013 left main normal, patent LAD stent, 90% circumflex stenosis, patent right coronary artery stents. Patient had a DES to the circumflex. The EF was well-preserved.  . Chronic lower back pain   . COPD (chronic obstructive pulmonary disease) ([place])   . Fatigue   . GERD (gastroesophageal reflux disease)   . Hard of hearing   . HTN (hypertension)   . Hx of radiation therapy    right nose 4500 cGy in 10 sessions over 5 weeks (2 treatments a week)  . Hyperlipemia   . NSTEMI (non-ST elevated myocardial infarction) ([place]) 08/28/2018  . On home oxygen therapy    "2L; 24/7" (08/27/2018)  . Pneumonia    "I've had a touch of it" (04/15/2018)  . Squamous cell carcinoma of nose    right   Past Surgical History:  Procedure Laterality Date  . ABDOMINAL HYSTERECTOMY    . BACK SURGERY  ?  . CORONARY ANGIOPLASTY WITH STENT PLACEMENT  2002-2009  . CORONARY ANGIOPLASTY WITH STENT PLACEMENT  05/06/12   "1; this makes  5"  . DILATION AND CURETTAGE OF UTERUS    . ESOPHAGEAL DILATION  ~ 2012   Dr. Deatra Ina  . LUMBAR SPINE SURGERY  04/2011   Rods, screws and cages  . PERCUTANEOUS CORONARY STENT INTERVENTION (PCI-S) N/A 05/06/2012   Procedure: PERCUTANEOUS CORONARY STENT INTERVENTION (PCI-S);  Surgeon: Burnell Blanks, MD;  Location: [facility] CATH LAB;  Service: Cardiovascular;  Laterality: N/A;  . SKIN BIOPSY  10/15/13   right nose-microinvasive squamous cell carcinoma  . TUBAL LIGATION       Current Meds  Medication Sig  . acetaminophen (TYLENOL 8 HOUR) 650 MG CR tablet Take 1 tablet (650 mg total) by mouth every 8 (eight) hours as needed.  (Patient taking differently: Take 650 mg by mouth every 8 (eight) hours as needed for pain or fever. )  . albuterol (PROVENTIL HFA;VENTOLIN HFA) 108 (90 BASE) MCG/ACT inhaler Inhale 2 puffs into the lungs every 4 (four) hours as needed for wheezing.  Marland Kitchen amitriptyline (ELAVIL) 10 MG tablet Take 10-20 mg by mouth at bedtime as needed (depression).   Marland Kitchen arformoterol (BROVANA) 15 MCG/2ML NEBU Take 2 mLs (15 mcg total) by nebulization 2 (two) times daily.  Marland Kitchen aspirin 81 MG tablet Take 1 tablet (81 mg total) by mouth daily. With food  . budesonide (PULMICORT) 0.25 MG/2ML nebulizer solution Take 2 mLs (0.25 mg total) by nebulization 2 (two) times daily. (Patient taking differently: Take 0.25 mg by nebulization 4 (four) times daily. )  . busPIRone (BUSPAR) 10 MG tablet Take 1 tablet (10 mg total) by mouth 2 (two) times daily.  . carvedilol (COREG) 12.5 MG tablet Take 1 tablet (12.5 mg total) by mouth 2 (two) times daily with a meal.  . cholecalciferol (VITAMIN D) 1000 UNITS tablet Take 1,000 Units by mouth daily.   Marland Kitchen dicyclomine (BENTYL) 20 MG tablet Take 1 tablet (20 mg total) by mouth 3 (three) times daily as needed for spasms. Cramping abdominal pain. (Patient taking differently: Take 20 mg by mouth 3 (three) times daily as needed (cramping abdominal pain). )  . fluticasone (FLONASE) 50 MCG/ACT nasal spray Place 2 sprays into both nostrils daily as needed for allergies or rhinitis.  . furosemide (LASIX) 20 MG tablet Take 2 tablets (40 mg total) by mouth daily.  Marland Kitchen gabapentin (NEURONTIN) 100 MG capsule Take 1 capsule (100 mg total) by mouth 2 (two) times daily as needed (pain).  Marland Kitchen guaiFENesin (MUCINEX) 600 MG 12 hr tablet Take 1 tablet (600 mg total) by mouth 2 (two) times daily.  . isosorbide mononitrate (IMDUR) 120 MG 24 hr tablet Take 1 tablet (120 mg total) by mouth daily.  Marland Kitchen lubiprostone (AMITIZA) 24 MCG capsule Take 1 capsule (24 mcg total) by mouth 2 (two) times daily with a meal.  . nitroGLYCERIN  (NITROSTAT) 0.4 MG SL tablet Place 0.4 mg under the tongue every 5 (five) minutes as needed for chest pain.  . polyethylene glycol (MIRALAX) packet Take 17 g by mouth daily as needed for mild constipation or moderate constipation.  . Revefenacin (YUPELRI) 175 MCG/3ML SOLN Inhale 3 mLs into the lungs daily. J44.9  . senna-docusate (SENOKOT-S) 8.6-50 MG tablet Take 2 tablets by mouth 2 (two) times daily.  Marland Kitchen trolamine salicylate (ASPERCREME) 10 % cream Apply 1 application topically daily as needed for muscle pain.  . [DISCONTINUED] calcium carbonate (OS-CAL) 600 MG TABS Take 600 mg by mouth daily.    . [DISCONTINUED] hydrALAZINE (APRESOLINE) 25 MG tablet Take 1 tablet (25 mg total) by mouth 3 (  three) times daily.  . [DISCONTINUED] pravastatin (PRAVACHOL) 40 MG tablet Take 40 mg by mouth daily.    . [DISCONTINUED] ticagrelor (BRILINTA) 90 MG TABS tablet Take 1 tablet (90 mg total) by mouth 2 (two) times daily.     Allergies:   Iohexol and Lipitor [atorvastatin calcium]   Social History   Tobacco Use  . Smoking status: Former Smoker    Packs/day: 0.50    Years: 55.00    Pack years: 27.50    Types: Cigarettes    Last attempt to quit: 11/28/2012    Years since quitting: 6.3  . Smokeless tobacco: Never Used  Substance Use Topics  . Alcohol use: Never    Alcohol/week: 0.0 standard drinks    Frequency: Never    Comment: "drank one time; never again"  . Drug use: Never     Family Hx: The patient's family history includes Bone cancer in her mother; Glaucoma in her father; Heart attack in her father.  ROS:   Please see the history of present illness.    Positive for mild fever and diarrhea.  All other systems reviewed and are negative.   Prior CV studies:   The following studies were reviewed today: October hospital records  Labs/Other Tests and Data Reviewed:    EKG:  No ECG reviewed.  Recent Labs: 11/24/2018: ALT 13; B Natriuretic Peptide 105.9; BUN 14; Creatinine, Ser 1.18;  Hemoglobin 11.7; Platelets 221; Potassium 3.8; Sodium 140   Recent Lipid Panel Lab Results  Component Value Date/Time   CHOL 194 10/06/2017 04:48 AM   TRIG 38 10/06/2017 04:48 AM   HDL 64 10/06/2017 04:48 AM   CHOLHDL 3.0 10/06/2017 04:48 AM   LDLCALC 122 (H) 10/06/2017 04:48 AM    Wt Readings from Last 3 Encounters:  04/06/19 200 lb (90.7 kg)  12/29/18 203 lb (92.1 kg)  09/01/18 194 lb 4.8 oz (88.1 kg)     Objective:    Vital Signs:  BP (!) 185/83   Pulse 78   Ht 5\' 4"  (1.626 m)   Wt 200 lb (90.7 kg)   SpO2 98%   BMI 34.33 kg/m    VITAL SIGNS:  reviewed  ASSESSMENT & PLAN:     CAD -  The patient has no new sypmtoms.  No further cardiovascular testing is indicated.  We will continue with aggressive risk reduction and meds as listed.  Hyperlipemia -  I will try to get her most recent lipid profile from her primary provider.  We will contact the primary provider office.  She is not taking her Pravachol and I am not sure if there is a reason for this.    HTN (hypertension) -  Her BP is not controlled and she should be on hydralazine.  Again, we are trying to get primary care office records to understand why this would have been stopped.    COVID-19 Education: The signs and symptoms of COVID-19 were discussed with the patient and how to seek care for testing (follow up with PCP or arrange E-visit).  The importance of social distancing was discussed today.  Time:   Today, I have spent 25 minutes with the patient with telehealth technology discussing the above problems.     Medication Adjustments/Labs and Tests Ordered: Current medicines are reviewed at length with the patient today.  Concerns regarding medicines are outlined above.   Tests Ordered: No orders of the defined types were placed in this encounter.   Medication Changes: No orders of the  defined types were placed in this encounter.   Disposition:  Follow up with the APP in six months in the office.  I  will also call her again after I have recieved office records understanding her recent med changes.    Signed, Minus Breeding, MD  04/06/2019 4:23 PM    [facility]

## 2019-04-06 ENCOUNTER — Encounter: Payer: Self-pay | Admitting: Cardiology

## 2019-04-06 ENCOUNTER — Telehealth (INDEPENDENT_AMBULATORY_CARE_PROVIDER_SITE_OTHER): Payer: Medicare Other | Admitting: Cardiology

## 2019-04-06 ENCOUNTER — Telehealth: Payer: Self-pay | Admitting: Cardiology

## 2019-04-06 VITALS — BP 185/83 | HR 78 | Ht 64.0 in | Wt 200.0 lb

## 2019-04-06 DIAGNOSIS — E785 Hyperlipidemia, unspecified: Secondary | ICD-10-CM

## 2019-04-06 DIAGNOSIS — I25119 Atherosclerotic heart disease of native coronary artery with unspecified angina pectoris: Secondary | ICD-10-CM | POA: Diagnosis not present

## 2019-04-06 DIAGNOSIS — R0602 Shortness of breath: Secondary | ICD-10-CM

## 2019-04-06 DIAGNOSIS — Z7189 Other specified counseling: Secondary | ICD-10-CM | POA: Insufficient documentation

## 2019-04-06 NOTE — Telephone Encounter (Signed)
Patient is calling because she missed her appt. Can someone call her

## 2019-04-08 NOTE — Telephone Encounter (Signed)
Pt had televisit with Dr Percival Spanish

## 2019-04-09 ENCOUNTER — Telehealth: Payer: Self-pay | Admitting: Cardiology

## 2019-04-09 NOTE — Telephone Encounter (Signed)
S/w Mikle Bosworth Daughter In law, she states that pt was told that she needed to re-start a medication for her BP.  Reviewed telemedicine note (HTN (hypertension) - Her BP is not controlled and she should be on hydralazine.  Again, we are trying to get primary care office records to understand why this would have been stopped.) relayed message to geraldine. Looks like we are just waiting to get notes from PCP.   Mikle Bosworth aware will await call from Dr Enrique Sack. Will forward message for further direction

## 2019-04-09 NOTE — Telephone Encounter (Signed)
New Message     Pts daughter in law is calling and said she went to the Pharmacy to get her prescription that was suppose to be called in yesterday and it is not there   Please call

## 2019-04-10 ENCOUNTER — Telehealth: Payer: Self-pay | Admitting: Cardiology

## 2019-04-10 NOTE — Telephone Encounter (Signed)
We called and talked with the primary care MD after my telehealth visit.  She was taken off her her hydralazine because of low BPs.  Her Pravachol was not stopped.  I tried to call her back on both telephone numbers but I did not get an answer.  She should restart Pravachol.  However, we need to see a two week BP diary taking the BP twice daily and then sent to [place] before restarting her hydralazine.  We will try to call her again next week with this message.

## 2019-04-14 MED ORDER — ISOSORBIDE MONONITRATE ER 120 MG PO TB24: 120.0000 mg | ORAL_TABLET | Freq: Every day | ORAL | 3 refills | Status: AC

## 2019-04-14 MED ORDER — PRAVASTATIN SODIUM 40 MG PO TABS: 40.0000 mg | ORAL_TABLET | Freq: Every evening | ORAL | 3 refills | Status: DC

## 2019-04-14 NOTE — Telephone Encounter (Signed)
Leave message for pt to cal back

## 2019-04-14 NOTE — Telephone Encounter (Signed)
F/U Message          Patient's daughter is calling to let someone know they are calling the wrong # for her mother. I changed the #;s so that we can reach the patient. Pls call again to advise.

## 2019-04-14 NOTE — Telephone Encounter (Signed)
Spoke with pt daughter in law about Dr Percival Spanish recommendation, advised her that pravastatin 40 mg was send into pt pharmacy and to have Ms Scharnhorst take her BP twice a day for the next 2 week and Dr Percival Spanish will decide if Hydralazine should be restart, pt daughter in law voice understanding and thanks for the call back.

## 2019-04-14 NOTE — Telephone Encounter (Signed)
Pt call back, Dr Percival Spanish recommendation given to pt, rx for pravastatin 40 mg send into pt pharmacy.

## 2019-04-14 NOTE — Addendum Note (Signed)
Addended by: Vennie Homans on: 04/14/2019 11:56 AM   Modules accepted: Orders

## 2019-04-29 ENCOUNTER — Telehealth: Payer: Self-pay | Admitting: Cardiology

## 2019-04-29 NOTE — Telephone Encounter (Signed)
Left message for pt to call.

## 2019-04-29 NOTE — Telephone Encounter (Signed)
New Message   Patient is calling because she was to call and provide her BP reading for two weeks. Patient does not want to give them to me.

## 2019-05-05 NOTE — Telephone Encounter (Signed)
Called patient back- she did have her BP list, but did not write down the days or times of taking her BP, she took some multiple times during the day- patient went through, and I did write down a few. Patient states that she is taking her medication as suggested by Dr.Hochrein.   She has not taken it anymore after the two weeks-   Starting on the 28th-  172/75; HR 81 188/89; HR 76 183/90; HR 63 157/66; HR 76 142/67; HR 76 152/67; HR 61 179/89; HR 82 142/83; HR 73 152/72; HR 65 182/72; HR 65 190/86; HR 74 130/69; HR 61 184/83; HR 63 175/62; HR 72 198/81; HR 71 193/87; HR 67 193/89; HR 78 198/81; HR 71 168/88 HR 70 183/74; HR 73 190/86; HR 73 193/83; HR 81 194/80; HR 87  Thanks!

## 2019-05-06 NOTE — Telephone Encounter (Signed)
She needs to restart her hydralazine at 25 mg po tid.

## 2019-05-07 NOTE — Telephone Encounter (Signed)
Left message to call back  

## 2019-05-17 NOTE — Telephone Encounter (Signed)
Left message to call back  

## 2019-05-31 ENCOUNTER — Telehealth: Payer: Self-pay | Admitting: Cardiology

## 2019-05-31 NOTE — Telephone Encounter (Signed)
New Message     Patient returning your call please give her a call back at your first convenience she states she doesn't know how long her phone will last.

## 2019-05-31 NOTE — Telephone Encounter (Signed)
Left message to call back  

## 2019-06-01 NOTE — Telephone Encounter (Signed)
Follow Up  Patient is calling in, returning a call from earlier. States that she has been having issues with telemarketers calling her phone, so if you don't get her the first time to try again.

## 2019-06-01 NOTE — Telephone Encounter (Signed)
LEFT MESSAGE TO CALL BACK -- FOLLOWING UP ON CALL FROM 05/31/19

## 2019-06-01 NOTE — Telephone Encounter (Signed)
Spoke to patient ,. She states she record blood  Pressure  Reading since last  Telephone conversation.  she also states she stopped taking _ pravstatin this past  Sunday 05/30/19 due to  Rash on  Her chest and  leg sorness . She states she took medication  For 2 weeks.   starting with today's  Blood pressure and working backwards 160/70  p 72 179/73 p 72 159/79 p 69 158/75 p 70 170/73 p 76 174/85, p 68 126/73, p 76 160/75, p 74 170/75, p 70 162/75 ,p 73 189/76, p 73 198/85, p 73 194/83, p 75 186/85, p 70 186/85, p 79 174/80, p 72 190/83, p 78 190/85, p 73 176/84, p 74 193/89,89, p 78  198/81, p 71   patient aware will defer to Dr Percival Spanish and contact her with any changes

## 2019-06-19 NOTE — Telephone Encounter (Signed)
Increase hydralazine to 50 mg tid.  She will likely need another prescription.

## 2019-06-21 NOTE — Telephone Encounter (Signed)
Left message to call back  

## 2019-07-20 NOTE — Telephone Encounter (Signed)
Left message to call back  

## 2019-08-05 ENCOUNTER — Other Ambulatory Visit: Payer: Self-pay

## 2019-08-05 ENCOUNTER — Ambulatory Visit (INDEPENDENT_AMBULATORY_CARE_PROVIDER_SITE_OTHER): Payer: Medicare Other | Admitting: Pulmonary Disease

## 2019-08-05 ENCOUNTER — Encounter: Payer: Self-pay | Admitting: Pulmonary Disease

## 2019-08-05 DIAGNOSIS — Z23 Encounter for immunization: Secondary | ICD-10-CM | POA: Diagnosis not present

## 2019-08-05 DIAGNOSIS — I25119 Atherosclerotic heart disease of native coronary artery with unspecified angina pectoris: Secondary | ICD-10-CM | POA: Diagnosis not present

## 2019-08-05 MED ORDER — PREDNISONE 20 MG PO TABS: 40.0000 mg | ORAL_TABLET | Freq: Every day | ORAL | 0 refills | Status: DC

## 2019-08-05 NOTE — Patient Instructions (Signed)
We will give prednisone 40 mg a day for 5 days We will give you a flu vaccination Continue nebulizers and albuterol inhaler as prescribed  Follow-up in 3 months.

## 2019-08-05 NOTE — Progress Notes (Signed)
[name]    CN:[id number]    09-Aug-1935  Primary Care Physician:[name], [name] Jews, MD  Referring Physician: Leonard Downing, MD [address] [place],   [id number]  Chief complaint:  Follow up for COPD GOLD D ([place] 4, several over past year)  HPI:  [name] is a 84 year old with past history as below. She has history of coronary artery disease with multiple stents. She had a stress test in January 2016 which was negative. She continues to complain of dyspnea and has been referred here by her cardiologist for further evaluation. She was started on supplemental oxygen at 2 L/m for desaturations. Her chief complaints are dyspnea on exertion. She denies any dyspnea at rest, cough, sputum production, wheezing. She has about 30-pack-year smoking history and quit in 2016.  November 2018- She had a hospital admission for dyspnea, abdominal pain.  Chest x-ray at that time does not show any acute process.  Treated for COPD exacerbation with BiPAP, Solu-Medrol.  Evaluated for abdominal pain of unclear etiology with negative CT of the abdomen.  GI evaluation was also non diagnostic.  Admitted in June 2019 for similar complaints of abdominal pain, dyspnea.  CT abdomen shows posterior hernia with fat stranding.  CT surgery did not recommend any intervention.  Treated for COPD exacerbation with steroids.   Interim history Reports worsening dyspnea over the past few weeks.  Continues on Brovana, Pulmicort and Yupelri She is using her rescue inhaler 3-4 times a day.  Outpatient Encounter Medications as of 08/05/2019  Medication Sig  . acetaminophen (TYLENOL 8 HOUR) 650 MG CR tablet Take 1 tablet (650 mg total) by mouth every 8 (eight) hours as needed. (Patient taking differently: Take 650 mg by mouth every 8 (eight) hours as needed for pain or fever. )  . albuterol (PROVENTIL HFA;VENTOLIN HFA) 108 (90 BASE) MCG/ACT inhaler Inhale 2 puffs into the lungs every 4 (four)  hours as needed for wheezing.  Marland Kitchen amitriptyline (ELAVIL) 10 MG tablet Take 10-20 mg by mouth at bedtime as needed (depression).   Marland Kitchen arformoterol (BROVANA) 15 MCG/2ML NEBU Take 2 mLs (15 mcg total) by nebulization 2 (two) times daily.  Marland Kitchen aspirin 81 MG tablet Take 1 tablet (81 mg total) by mouth daily. With food  . budesonide (PULMICORT) 0.25 MG/2ML nebulizer solution Take 2 mLs (0.25 mg total) by nebulization 2 (two) times daily. (Patient taking differently: Take 0.25 mg by nebulization 4 (four) times daily. )  . busPIRone (BUSPAR) 10 MG tablet Take 1 tablet (10 mg total) by mouth 2 (two) times daily.  . cholecalciferol (VITAMIN D) 1000 UNITS tablet Take 1,000 Units by mouth daily.   Marland Kitchen dicyclomine (BENTYL) 20 MG tablet Take 1 tablet (20 mg total) by mouth 3 (three) times daily as needed for spasms. Cramping abdominal pain. (Patient taking differently: Take 20 mg by mouth 3 (three) times daily as needed (cramping abdominal pain). )  . famotidine (PEPCID) 20 MG tablet TAKE 1 TABLET BY MOUTH EVERY 12 HOURS AS NEEDED FOR REFLUX  . fluticasone (FLONASE) 50 MCG/ACT nasal spray Place 2 sprays into both nostrils daily as needed for allergies or rhinitis.  . furosemide (LASIX) 20 MG tablet Take 2 tablets (40 mg total) by mouth daily.  Marland Kitchen gabapentin (NEURONTIN) 100 MG capsule Take 1 capsule (100 mg total) by mouth 2 (two) times daily as needed (pain).  Marland Kitchen guaiFENesin (MUCINEX) 600 MG 12 hr tablet Take 1 tablet (600 mg total)  by mouth 2 (two) times daily.  . isosorbide mononitrate (IMDUR) 120 MG 24 hr tablet Take 1 tablet (120 mg total) by mouth daily.  Marland Kitchen lubiprostone (AMITIZA) 24 MCG capsule Take 1 capsule (24 mcg total) by mouth 2 (two) times daily with a meal.  . nitroGLYCERIN (NITROSTAT) 0.4 MG SL tablet Place 0.4 mg under the tongue every 5 (five) minutes as needed for chest pain.  . pantoprazole (PROTONIX) 40 MG tablet TAKE 1 TABLET BY MOUTH ONCE DAILY AS NEEDED FOR ABDOMINAL PAIN  . polyethylene glycol  (MIRALAX) packet Take 17 g by mouth daily as needed for mild constipation or moderate constipation.  . pravastatin (PRAVACHOL) 40 MG tablet Take 1 tablet (40 mg total) by mouth every evening.  . Revefenacin (YUPELRI) 175 MCG/3ML SOLN Inhale 3 mLs into the lungs daily. 0000000  . trolamine salicylate (ASPERCREME) 10 % cream Apply 1 application topically daily as needed for muscle pain.  . [DISCONTINUED] carvedilol (COREG) 12.5 MG tablet Take 1 tablet (12.5 mg total) by mouth 2 (two) times daily with a meal.  . [DISCONTINUED] senna-docusate (SENOKOT-S) 8.6-50 MG tablet Take 2 tablets by mouth 2 (two) times daily.   No facility-administered encounter medications on file as of 08/05/2019.    Physical Exam: Blood pressure 120/68, pulse 61, temperature (!) 96.3 F (35.7 C), temperature source Temporal, height 5\' 5"  (1.651 m), weight 223 lb 3.2 oz (101.2 kg), SpO2 93 %. Gen:      No acute distress HEENT:  EOMI, sclera anicteric Neck:     No masses; no thyromegaly Lungs:    Bilateral expiratory wheeze. CV:         Regular rate and rhythm; no murmurs Abd:      + bowel sounds; soft, non-tender; no palpable masses, no distension Ext:    No edema; adequate peripheral perfusion Skin:      Warm and dry; no rash Neuro: alert and oriented x 3 Psych: normal mood and affect  Data Reviewed: Imaging Abdominal CT 12/26/16-right diaphragm hernia, and basilar atelectasis or mild scarring Abdominal CT 10/06/17-bibasilar atelectasis Abdominal CT 11/07/17-stable bibasilar atelectasis Abdominal CT 02/24/2018-stable basal atelectasis, right diaphragm hernia. Abdominal CT 04/14/2018- stable lower lung findings. Chest x-ray 11/24/2018-increased lung markings stable compared to prior. I have reviewed the images personally.  PFTs 10/27/16 FVC 1.82 (66%),FEV1 0.92 (44%), F/F 50, TLC 82%, DLCO 19% Severe obstructive airway disease with broncho dilator response Severe diffusion defect  Labs: CBC 02/24/2018-WBC 9.4, eos 1%,  absolute eosinophil count 94 CBC 11/24/2018-WBC 7.2, eos 2%, absolute eosinophil count 144 IgE 08/20/2018-88 Alpha-1 antitrypsin 08/20/2018- 177 PI MM  Assessment:  COPD GOLD D Has increasing dyspnea with wheezing Prednisone for 5 days Continue on Brovana, yupelri and Pulmicort Add duonebs as needed Continue supplemental oxygen.   Health maintenance Flu vaccine today She got Pneumovax at her doctor's office.  Plan/Recommendations: - Continue Brovana, pulmicort, yupelri nebs - Duo nebs - Prednisone 40 mg a day x 5 days  Return in 3 months.   Marshell Garfinkel MD [facility] 08/05/2019, 2:47 PM  CC: [name], *

## 2019-08-12 NOTE — Telephone Encounter (Signed)
Left message to call back  

## 2019-08-30 ENCOUNTER — Other Ambulatory Visit: Payer: Self-pay | Admitting: Pulmonary Disease

## 2019-08-30 DIAGNOSIS — J441 Chronic obstructive pulmonary disease with (acute) exacerbation: Secondary | ICD-10-CM

## 2019-09-23 NOTE — Telephone Encounter (Signed)
Patient never returned call  

## 2019-11-26 ENCOUNTER — Encounter (HOSPITAL_COMMUNITY): Payer: Self-pay | Admitting: Emergency Medicine

## 2019-11-26 ENCOUNTER — Inpatient Hospital Stay (HOSPITAL_COMMUNITY)
Admission: EM | Admit: 2019-11-26 | Discharge: 2019-12-03 | DRG: 092 | Disposition: A | Discharge status: Skilled Nursing Facility | Payer: Medicare Other | Attending: Internal Medicine | Admitting: Internal Medicine

## 2019-11-26 ENCOUNTER — Other Ambulatory Visit: Payer: Self-pay

## 2019-11-26 ENCOUNTER — Emergency Department (HOSPITAL_COMMUNITY): Payer: Medicare Other

## 2019-11-26 DIAGNOSIS — R531 Weakness: Secondary | ICD-10-CM

## 2019-11-26 DIAGNOSIS — G92 Toxic encephalopathy: Principal | ICD-10-CM | POA: Diagnosis present

## 2019-11-26 DIAGNOSIS — I129 Hypertensive chronic kidney disease with stage 1 through stage 4 chronic kidney disease, or unspecified chronic kidney disease: Secondary | ICD-10-CM | POA: Diagnosis present

## 2019-11-26 DIAGNOSIS — J449 Chronic obstructive pulmonary disease, unspecified: Secondary | ICD-10-CM | POA: Diagnosis present

## 2019-11-26 DIAGNOSIS — G9341 Metabolic encephalopathy: Secondary | ICD-10-CM | POA: Diagnosis present

## 2019-11-26 DIAGNOSIS — K219 Gastro-esophageal reflux disease without esophagitis: Secondary | ICD-10-CM | POA: Diagnosis present

## 2019-11-26 DIAGNOSIS — F419 Anxiety disorder, unspecified: Secondary | ICD-10-CM | POA: Diagnosis present

## 2019-11-26 DIAGNOSIS — E785 Hyperlipidemia, unspecified: Secondary | ICD-10-CM | POA: Diagnosis present

## 2019-11-26 DIAGNOSIS — I160 Hypertensive urgency: Secondary | ICD-10-CM | POA: Diagnosis present

## 2019-11-26 DIAGNOSIS — Z7901 Long term (current) use of anticoagulants: Secondary | ICD-10-CM

## 2019-11-26 DIAGNOSIS — Z955 Presence of coronary angioplasty implant and graft: Secondary | ICD-10-CM

## 2019-11-26 DIAGNOSIS — T50915A Adverse effect of multiple unspecified drugs, medicaments and biological substances, initial encounter: Secondary | ICD-10-CM | POA: Diagnosis present

## 2019-11-26 DIAGNOSIS — Z923 Personal history of irradiation: Secondary | ICD-10-CM

## 2019-11-26 DIAGNOSIS — Z20822 Contact with and (suspected) exposure to covid-19: Secondary | ICD-10-CM | POA: Diagnosis present

## 2019-11-26 DIAGNOSIS — I480 Paroxysmal atrial fibrillation: Secondary | ICD-10-CM | POA: Diagnosis not present

## 2019-11-26 DIAGNOSIS — M791 Myalgia, unspecified site: Secondary | ICD-10-CM | POA: Diagnosis not present

## 2019-11-26 DIAGNOSIS — Z8673 Personal history of transient ischemic attack (TIA), and cerebral infarction without residual deficits: Secondary | ICD-10-CM

## 2019-11-26 DIAGNOSIS — Z87891 Personal history of nicotine dependence: Secondary | ICD-10-CM

## 2019-11-26 DIAGNOSIS — N1831 Chronic kidney disease, stage 3a: Secondary | ICD-10-CM | POA: Diagnosis present

## 2019-11-26 DIAGNOSIS — Z6837 Body mass index (BMI) 37.0-37.9, adult: Secondary | ICD-10-CM

## 2019-11-26 DIAGNOSIS — J9611 Chronic respiratory failure with hypoxia: Secondary | ICD-10-CM | POA: Diagnosis present

## 2019-11-26 DIAGNOSIS — I248 Other forms of acute ischemic heart disease: Secondary | ICD-10-CM | POA: Diagnosis present

## 2019-11-26 DIAGNOSIS — I2510 Atherosclerotic heart disease of native coronary artery without angina pectoris: Secondary | ICD-10-CM | POA: Diagnosis present

## 2019-11-26 LAB — URINALYSIS, ROUTINE W REFLEX MICROSCOPIC
Bacteria, UA: NONE SEEN
Bilirubin Urine: NEGATIVE
Glucose, UA: NEGATIVE mg/dL
Hgb urine dipstick: NEGATIVE
Ketones, ur: 20 mg/dL — AB
Leukocytes,Ua: NEGATIVE
Nitrite: NEGATIVE
Protein, ur: 30 mg/dL — AB
Specific Gravity, Urine: 1.013 (ref 1.005–1.030)
pH: 8 (ref 5.0–8.0)

## 2019-11-26 LAB — COMPREHENSIVE METABOLIC PANEL
ALT: 26 U/L (ref 0–44)
AST: 20 U/L (ref 15–41)
Albumin: 4 g/dL (ref 3.5–5.0)
Alkaline Phosphatase: 74 U/L (ref 38–126)
Anion gap: 14 (ref 5–15)
BUN: 21 mg/dL (ref 8–23)
CO2: 34 mmol/L — ABNORMAL HIGH (ref 22–32)
Calcium: 9.6 mg/dL (ref 8.9–10.3)
Chloride: 96 mmol/L — ABNORMAL LOW (ref 98–111)
Creatinine, Ser: 1.19 mg/dL — ABNORMAL HIGH (ref 0.44–1.00)
GFR calc Af Amer: 49 mL/min — ABNORMAL LOW (ref >60)
GFR calc non Af Amer: 42 mL/min — ABNORMAL LOW (ref >60)
Glucose, Bld: 131 mg/dL — ABNORMAL HIGH (ref 70–99)
Potassium: 3.6 mmol/L (ref 3.5–5.1)
Sodium: 144 mmol/L (ref 135–145)
Total Bilirubin: 0.7 mg/dL (ref 0.3–1.2)
Total Protein: 7.3 g/dL (ref 6.5–8.1)

## 2019-11-26 LAB — POC SARS CORONAVIRUS 2 AG -  ED: SARS Coronavirus 2 Ag: NEGATIVE

## 2019-11-26 LAB — CBC
HCT: 47.7 % — ABNORMAL HIGH (ref 36.0–46.0)
Hemoglobin: 14.5 g/dL (ref 12.0–15.0)
MCH: 30.3 pg (ref 26.0–34.0)
MCHC: 30.4 g/dL (ref 30.0–36.0)
MCV: 99.8 fL (ref 80.0–100.0)
Platelets: 256 10*3/uL (ref 150–400)
RBC: 4.78 MIL/uL (ref 3.87–5.11)
RDW: 12.1 % (ref 11.5–15.5)
WBC: 7.9 10*3/uL (ref 4.0–10.5)
nRBC: 0 % (ref 0.0–0.2)

## 2019-11-26 LAB — TROPONIN I (HIGH SENSITIVITY)
Troponin I (High Sensitivity): 101 ng/L (ref <18)
Troponin I (High Sensitivity): 134 ng/L (ref <18)

## 2019-11-26 LAB — RESPIRATORY PANEL BY RT PCR (FLU A&B, COVID)
Influenza A by PCR: NEGATIVE
Influenza B by PCR: NEGATIVE
SARS Coronavirus 2 by RT PCR: NEGATIVE

## 2019-11-26 LAB — LIPASE, BLOOD: Lipase: 24 U/L (ref 11–51)

## 2019-11-26 MED ORDER — SODIUM CHLORIDE 0.9 % IV BOLUS
1000.0000 mL | Freq: Once | INTRAVENOUS | Status: AC
  Administered 2019-11-26: 1000 mL via INTRAVENOUS

## 2019-11-26 MED ORDER — SODIUM CHLORIDE 0.9% FLUSH: 3.0000 mL | Freq: Once | INTRAVENOUS | Status: DC

## 2019-11-26 NOTE — ED Notes (Signed)
Critical Lab:  HS Troponin: 101 MD made aware.

## 2019-11-26 NOTE — ED Triage Notes (Signed)
Per [facility] pt from home where her family found her covered in her urine for the past 2 days. Reports lives by herself. Pt moaning and yelling during triage. Pt is on continuous O2 of 4L. Pt c/o entire body hurts x 3 years CBG 124, 182/98, 96%, 78HR,

## 2019-11-26 NOTE — ED Provider Notes (Signed)
[facility] DEPT Provider Note   CSN: [id number] Arrival date & time: 11/26/19  1823     History Chief Complaint  Patient presents with  . found covered in urine  . Generalized Body Aches    [name] is a 85 y.o. female.  Patient complains of weakness all over with aching all over.  She lives by herself and she gets around with a walker and her children look in on her.  She has been sitting in her own urine for 2 days because she was too weak.  The history is provided by the patient. No language interpreter was used.  Weakness Severity:  Moderate Onset quality:  Sudden Timing:  Constant Progression:  Waxing and waning Chronicity:  New Context: not alcohol use   Relieved by:  Nothing Worsened by:  Nothing Ineffective treatments:  None tried Associated symptoms: no abdominal pain, no chest pain, no cough, no diarrhea, no frequency, no headaches and no seizures        Past Medical History:  Diagnosis Date  . Arthritis    "in my spine" (04/15/2018)  . Blurred vision   . CAD (coronary artery disease)    Multiple percutaneous revascularization. Catheterization May 2013 left main normal, patent LAD stent, 90% circumflex stenosis, patent right coronary artery stents. Patient had a DES to the circumflex. The EF was well-preserved.  . Chronic lower back pain   . COPD (chronic obstructive pulmonary disease) ([place])   . Fatigue   . GERD (gastroesophageal reflux disease)   . Hard of hearing   . HTN (hypertension)   . Hx of radiation therapy    right nose 4500 cGy in 10 sessions over 5 weeks (2 treatments a week)  . Hyperlipemia   . NSTEMI (non-ST elevated myocardial infarction) ([place]) 08/28/2018  . On home oxygen therapy    "2L; 24/7" (08/27/2018)  . Pneumonia    "I've had a touch of it" (04/15/2018)  . Squamous cell carcinoma of nose    right    Patient Active Problem List   Diagnosis Date Noted  . Coronary artery disease involving  native coronary artery of native heart with angina pectoris ([place]) 04/06/2019  . Dyslipidemia 04/06/2019  . Shortness of breath 04/06/2019  . Educated about COVID-19 virus infection 04/06/2019  . Acute on chronic diastolic heart failure ([place])   . NSTEMI (non-ST elevated myocardial infarction) ([place]) 08/28/2018  . Acute kidney injury ([place]) 08/28/2018  . Acute exacerbation of chronic obstructive pulmonary disease (COPD) ([place]) 08/28/2018  . GERD (gastroesophageal reflux disease) 04/14/2018  . Back pain 04/14/2018  . Right flank pain 04/14/2018  . COPD, group D, by GOLD 2017 classification ([place]) 10/06/2017  . Depression 10/05/2017  . Rectal pain 10/05/2017  . AP (abdominal pain)   . Atypical chest pain 11/28/2014  . Chest pain 11/28/2014  . Tobacco abuse 11/28/2014  . Skin cancer of nose   . Squamous cell carcinoma of skin of other and unspecified parts of face 10/15/2013  . COPD exacerbation ([place]) 01/30/2013  . Abdominal pain 01/30/2013  . Constipation 01/30/2013  . Hypokalemia 01/30/2013  . Leukocytosis 05/14/2012  . HTN (hypertension) 05/14/2012  . Progressive angina ([place]) 05/07/2012  . Hyperlipemia   . Dysphagia, unspecified(787.20) 02/04/2012  . Esophageal reflux 02/04/2012  . Coronary atherosclerosis 08/28/2010  . ANAL OR RECTAL PAIN 08/28/2010  . NAUSEA ALONE 08/28/2010    Past Surgical History:  Procedure Laterality Date  . ABDOMINAL HYSTERECTOMY    . BACK SURGERY  ?  Marland Kitchen  CORONARY ANGIOPLASTY WITH STENT PLACEMENT  2002-2009  . CORONARY ANGIOPLASTY WITH STENT PLACEMENT  05/06/12   "1; this makes 5"  . DILATION AND CURETTAGE OF UTERUS    . ESOPHAGEAL DILATION  ~ 2012   Dr. Deatra Ina  . LUMBAR SPINE SURGERY  04/2011   Rods, screws and cages  . PERCUTANEOUS CORONARY STENT INTERVENTION (PCI-S) N/A 05/06/2012   Procedure: PERCUTANEOUS CORONARY STENT INTERVENTION (PCI-S);  Surgeon: Burnell Blanks, MD;  Location: [facility] C[facility] LAB;  Service: Cardiovascular;  Laterality: N/A;  .  SKIN BIOPSY  10/15/13   right nose-microinvasive squamous cell carcinoma  . TUBAL LIGATION       OB History   No obstetric history on file.     Family History  Problem Relation Age of Onset  . Glaucoma Father   . Heart attack Father   . Bone cancer Mother     Social History   Tobacco Use  . Smoking status: Former Smoker    Packs/day: 0.50    Years: 55.00    Pack years: 27.50    Types: Cigarettes    Quit date: 11/28/2012    Years since quitting: 6.9  . Smokeless tobacco: Never Used  Substance Use Topics  . Alcohol use: Never    Alcohol/week: 0.0 standard drinks    Comment: "drank one time; never again"  . Drug use: Never    Home Medications Prior to Admission medications   Medication Sig Start Date End Date Taking? Authorizing Provider  acetaminophen (TYLENOL 8 HOUR) 650 MG CR tablet Take 1 tablet (650 mg total) by mouth every 8 (eight) hours as needed. Patient taking differently: Take 650 mg by mouth every 8 (eight) hours as needed for pain or fever.  03/16/18  Yes Varney Biles, MD  albuterol (PROVENTIL HFA;VENTOLIN HFA) 108 (90 BASE) MCG/ACT inhaler Inhale 2 puffs into the lungs every 4 (four) hours as needed for wheezing. 12/01/14  Yes Delfina Redwood, MD  AMITIZA 8 MCG capsule Take 8 mcg by mouth every 12 (twelve) hours. 11/21/19  Yes [provider]  amitriptyline (ELAVIL) 10 MG tablet Take 10-20 mg by mouth at bedtime as needed (depression).    Yes [provider]  aspirin 81 MG tablet Take 1 tablet (81 mg total) by mouth daily. With food 09/01/18  Yes Emokpae, Courage, MD  BROVANA 15 MCG/2ML NEBU USE 1 VIAL  IN  NEBULIZER TWICE  DAILY - morning and evening Patient taking differently: Take 15 mcg by nebulization 2 (two) times daily.  08/30/19  Yes Mannam, Praveen, MD  budesonide (PULMICORT) 0.25 MG/2ML nebulizer solution USE 1 VIAL  IN  NEBULIZER TWICE  DAILY - rinse mouth after treatment Patient taking differently: Take 0.25 mg by nebulization  2 (two) times daily.  08/30/19  Yes Mannam, Praveen, MD  busPIRone (BUSPAR) 10 MG tablet Take 1 tablet (10 mg total) by mouth 2 (two) times daily. 09/01/18  Yes Emokpae, Courage, MD  cholecalciferol (VITAMIN D) 1000 UNITS tablet Take 1,000 Units by mouth daily.    Yes [provider]  dicyclomine (BENTYL) 20 MG tablet Take 1 tablet (20 mg total) by mouth 3 (three) times daily as needed for spasms. Cramping abdominal pain. Patient taking differently: Take 20 mg by mouth 3 (three) times daily as needed (cramping abdominal pain).  08/16/17  Yes Charlesetta Shanks, MD  famotidine (PEPCID) 20 MG tablet Take 20 mg by mouth every 12 (twelve) hours as needed for heartburn.  12/11/18  Yes [provider]  furosemide (LASIX) 20 MG tablet Take 2 tablets (40 mg total) by mouth daily. 09/01/18 11/26/19 Yes Emokpae, Courage, MD  gabapentin (NEURONTIN) 300 MG capsule Take 300 mg by mouth every 6 (six) hours as needed for pain. 11/22/19  Yes [provider]  guaiFENesin (MUCINEX) 600 MG 12 hr tablet Take 1 tablet (600 mg total) by mouth 2 (two) times daily. 09/01/18  Yes Roxan Hockey, MD  isosorbide mononitrate (IMDUR) 120 MG 24 hr tablet Take 1 tablet (120 mg total) by mouth daily. 04/14/19  Yes Minus Breeding, MD  nitroGLYCERIN (NITROSTAT) 0.4 MG SL tablet Place 0.4 mg under the tongue every 5 (five) minutes as needed for chest pain.   Yes [provider]  pantoprazole (PROTONIX) 40 MG tablet Take 40 mg by mouth daily as needed.  03/17/19  Yes [provider]  polyethylene glycol (MIRALAX) packet Take 17 g by mouth daily as needed for mild constipation or moderate constipation. 09/01/18  Yes Emokpae, Courage, MD  trolamine salicylate (ASPERCREME) 10 % cream Apply 1 application topically daily as needed for muscle pain.   Yes [provider]  YUPELRI 175 MCG/3ML SOLN USE 1 VIAL IN NEBULIZER DAILY - DO NOT MIX WITH OTHER NEB MEDS,USE BEFORE OR AFTER Patient taking  differently: Inhale 3 mLs into the lungs daily.  08/30/19  Yes Mannam, Praveen, MD  fluticasone (FLONASE) 50 MCG/ACT nasal spray Place 2 sprays into both nostrils daily as needed for allergies or rhinitis. Patient not taking: Reported on 11/26/2019 10/12/17   Cherene Altes, MD  pravastatin (PRAVACHOL) 40 MG tablet Take 1 tablet (40 mg total) by mouth every evening. Patient not taking: Reported on 11/26/2019 04/14/19 11/26/19  Minus Breeding, MD  predniSONE (DELTASONE) 20 MG tablet Take 2 tablets (40 mg total) by mouth daily with breakfast. Patient not taking: Reported on 11/26/2019 08/05/19   Marshell Garfinkel, MD    Allergies    Iohexol and Lipitor [atorvastatin calcium]  Review of Systems   Review of Systems  Constitutional: Negative for appetite change and fatigue.  HENT: Negative for congestion, ear discharge and sinus pressure.   Eyes: Negative for discharge.  Respiratory: Negative for cough.   Cardiovascular: Negative for chest pain.  Gastrointestinal: Negative for abdominal pain and diarrhea.  Genitourinary: Negative for frequency and hematuria.  Musculoskeletal: Negative for back pain.  Skin: Negative for rash.  Neurological: Positive for weakness. Negative for seizures and headaches.  Psychiatric/Behavioral: Negative for hallucinations.    Physical Exam Updated Vital Signs BP (!) 205/84 (BP Location: Left Arm)   Pulse 71   Temp 98.3 F (36.8 C) (Oral)   Resp 18   SpO2 98%   Physical Exam Vitals and nursing note reviewed.  Constitutional:      Appearance: She is well-developed.  HENT:     Head: Normocephalic.     Nose: Nose normal.  Eyes:     General: No scleral icterus.    Conjunctiva/sclera: Conjunctivae normal.  Neck:     Thyroid: No thyromegaly.  Cardiovascular:     Rate and Rhythm: Normal rate and regular rhythm.     Heart sounds: No murmur. No friction rub. No gallop.   Pulmonary:     Breath sounds: No stridor. No wheezing or rales.  Chest:     Chest  wall: No tenderness.  Abdominal:     General: There is no distension.     Tenderness: There is no abdominal tenderness. There is no rebound.  Musculoskeletal:  General: Normal range of motion.     Cervical back: Neck supple.     Comments: Patient unable to ambulate.  I stood her up and she was barely able to stand.  She was feeling wobbly and fell back into the bed  Lymphadenopathy:     Cervical: No cervical adenopathy.  Skin:    Findings: No erythema or rash.  Neurological:     Mental Status: She is oriented to person, place, and time.     Motor: No abnormal muscle tone.     Coordination: Coordination normal.  Psychiatric:        Behavior: Behavior normal.     ED Results / Procedures / Treatments   Labs (all labs ordered are listed, but only abnormal results are displayed) Labs Reviewed  COMPREHENSIVE METABOLIC PANEL - Abnormal; Notable for the following components:      Result Value   Chloride 96 (*)    CO2 34 (*)    Glucose, Bld 131 (*)    Creatinine, Ser 1.19 (*)    GFR calc non Af Amer 42 (*)    GFR calc Af Amer 49 (*)    All other components within normal limits  CBC - Abnormal; Notable for the following components:   HCT 47.7 (*)    All other components within normal limits  URINALYSIS, ROUTINE W REFLEX MICROSCOPIC - Abnormal; Notable for the following components:   Ketones, ur 20 (*)    Protein, ur 30 (*)    All other components within normal limits  TROPONIN I (HIGH SENSITIVITY) - Abnormal; Notable for the following components:   Troponin I (High Sensitivity) 101 (*)    All other components within normal limits  RESPIRATORY PANEL BY RT PCR (FLU A&B, COVID)  LIPASE, BLOOD  POC SARS CORONAVIRUS 2 AG -  ED  TROPONIN I (HIGH SENSITIVITY)    EKG EKG Interpretation  Date/Time:  Friday November 26 2019 19:54:54 EST Ventricular Rate:  64 PR Interval:    QRS Duration: 120 QT Interval:  470 QTC Calculation: 485 R Axis:   89 Text Interpretation: Sinus  rhythm IVCD, consider atypical RBBB Confirmed by Milton Ferguson [phone]) on 11/26/2019 9:36:09 PM   Radiology CT Head Wo Contrast  Result Date: 11/26/2019 CLINICAL DATA:  Ataxia, question stroke EXAM: CT HEAD WITHOUT CONTRAST TECHNIQUE: Contiguous axial images were obtained from the base of the skull through the vertex without intravenous contrast. COMPARISON:  November 24, 2018 FINDINGS: Brain: No evidence of acute territorial infarction, hemorrhage, hydrocephalus,extra-axial collection or mass lesion/mass effect. There is dilatation the ventricles and sulci consistent with age-related atrophy. Low-attenuation changes in the deep white matter consistent with small vessel ischemia. Prior lacunar infarct in the left basal ganglia. Vascular: No hyperdense vessel or unexpected calcification. Skull: The skull is intact. No fracture or focal lesion identified. Sinuses/Orbits: Again noted is opacification of the bilateral mastoid air cells. The orbits and globes intact. Other: None IMPRESSION: No acute intracranial abnormality. Findings consistent with age related atrophy and chronic small vessel ischemia Small unchanged lacunar infarct in the left basal ganglia. Electronically Signed   By: Prudencio Pair M.D.   On: 11/26/2019 21:12   DG Pelvis Portable  Result Date: 11/26/2019 CLINICAL DATA:  Weakness EXAM: PORTABLE PELVIS 1-2 VIEWS COMPARISON:  None. FINDINGS: Pelvic ring is intact. No acute fracture or dislocation is noted. Postsurgical changes in the lumbar spine are seen. No soft tissue abnormality is noted. IMPRESSION: No acute abnormality seen. Electronically Signed   By: Elta Guadeloupe  Lukens M.D.   On: 11/26/2019 20:33   DG Chest Port 1 View  Result Date: 11/26/2019 CLINICAL DATA:  Shortness of breath. EXAM: PORTABLE CHEST 1 VIEW COMPARISON:  November 24, 2018 FINDINGS: Mild, chronic-appearing increased interstitial lung markings are seen. This is unchanged in appearance when compared to the prior study. There is no  evidence of a pleural effusion or pneumothorax. The cardiac silhouette is moderately enlarged. There is moderate severity calcification of the aortic arch. The visualized skeletal structures are unremarkable. IMPRESSION: 1. Chronic appearing increased interstitial lung markings without evidence of acute or active cardiopulmonary disease. Electronically Signed   By: Virgina Norfolk M.D.   On: 11/26/2019 20:33    Procedures Procedures (including critical care time)  Medications Ordered in ED Medications  sodium chloride flush (NS) 0.9 % injection 3 mL (0 mLs Intravenous Hold 11/26/19 1953)  sodium chloride 0.9 % bolus 1,000 mL (0 mLs Intravenous Stopped 11/26/19 2144)    ED Course  I have reviewed the triage vital signs and the nursing notes.  Pertinent labs & imaging results that were available during my care of the patient were reviewed by me and considered in my medical decision making (see chart for details).    MDM Rules/Calculators/A&P                      Patient with COPD coronary artery disease obesity and profound weakness.  Patient will be admitted to medicine.  She does have a mildly elevated troponin but no chest pain no EKG changes Final Clinical Impression(s) / ED Diagnoses Final diagnoses:  Myalgia    Rx / DC Orders ED Discharge Orders    None       Milton Ferguson, MD 11/26/19 2225

## 2019-11-27 ENCOUNTER — Observation Stay (HOSPITAL_COMMUNITY): Payer: Medicare Other

## 2019-11-27 DIAGNOSIS — Z955 Presence of coronary angioplasty implant and graft: Secondary | ICD-10-CM | POA: Diagnosis not present

## 2019-11-27 DIAGNOSIS — J449 Chronic obstructive pulmonary disease, unspecified: Secondary | ICD-10-CM | POA: Diagnosis present

## 2019-11-27 DIAGNOSIS — Z923 Personal history of irradiation: Secondary | ICD-10-CM | POA: Diagnosis not present

## 2019-11-27 DIAGNOSIS — Z8673 Personal history of transient ischemic attack (TIA), and cerebral infarction without residual deficits: Secondary | ICD-10-CM | POA: Diagnosis not present

## 2019-11-27 DIAGNOSIS — G9341 Metabolic encephalopathy: Secondary | ICD-10-CM | POA: Diagnosis not present

## 2019-11-27 DIAGNOSIS — N1831 Chronic kidney disease, stage 3a: Secondary | ICD-10-CM | POA: Diagnosis present

## 2019-11-27 DIAGNOSIS — G92 Toxic encephalopathy: Secondary | ICD-10-CM | POA: Diagnosis present

## 2019-11-27 DIAGNOSIS — Z87891 Personal history of nicotine dependence: Secondary | ICD-10-CM | POA: Diagnosis not present

## 2019-11-27 DIAGNOSIS — I2510 Atherosclerotic heart disease of native coronary artery without angina pectoris: Secondary | ICD-10-CM | POA: Diagnosis present

## 2019-11-27 DIAGNOSIS — I10 Essential (primary) hypertension: Secondary | ICD-10-CM | POA: Diagnosis not present

## 2019-11-27 DIAGNOSIS — R531 Weakness: Secondary | ICD-10-CM

## 2019-11-27 DIAGNOSIS — I4891 Unspecified atrial fibrillation: Secondary | ICD-10-CM | POA: Diagnosis not present

## 2019-11-27 DIAGNOSIS — Z20822 Contact with and (suspected) exposure to covid-19: Secondary | ICD-10-CM | POA: Diagnosis present

## 2019-11-27 DIAGNOSIS — J9611 Chronic respiratory failure with hypoxia: Secondary | ICD-10-CM | POA: Diagnosis present

## 2019-11-27 DIAGNOSIS — M791 Myalgia, unspecified site: Secondary | ICD-10-CM | POA: Diagnosis present

## 2019-11-27 DIAGNOSIS — I160 Hypertensive urgency: Secondary | ICD-10-CM | POA: Diagnosis present

## 2019-11-27 DIAGNOSIS — K219 Gastro-esophageal reflux disease without esophagitis: Secondary | ICD-10-CM | POA: Diagnosis present

## 2019-11-27 DIAGNOSIS — Z6837 Body mass index (BMI) 37.0-37.9, adult: Secondary | ICD-10-CM | POA: Diagnosis not present

## 2019-11-27 DIAGNOSIS — I480 Paroxysmal atrial fibrillation: Secondary | ICD-10-CM | POA: Diagnosis not present

## 2019-11-27 DIAGNOSIS — T50915A Adverse effect of multiple unspecified drugs, medicaments and biological substances, initial encounter: Secondary | ICD-10-CM | POA: Diagnosis present

## 2019-11-27 DIAGNOSIS — Z7901 Long term (current) use of anticoagulants: Secondary | ICD-10-CM | POA: Diagnosis not present

## 2019-11-27 DIAGNOSIS — I129 Hypertensive chronic kidney disease with stage 1 through stage 4 chronic kidney disease, or unspecified chronic kidney disease: Secondary | ICD-10-CM | POA: Diagnosis present

## 2019-11-27 DIAGNOSIS — F419 Anxiety disorder, unspecified: Secondary | ICD-10-CM | POA: Diagnosis present

## 2019-11-27 DIAGNOSIS — I248 Other forms of acute ischemic heart disease: Secondary | ICD-10-CM | POA: Diagnosis present

## 2019-11-27 DIAGNOSIS — E785 Hyperlipidemia, unspecified: Secondary | ICD-10-CM | POA: Diagnosis present

## 2019-11-27 LAB — BASIC METABOLIC PANEL
Anion gap: 12 (ref 5–15)
BUN: 21 mg/dL (ref 8–23)
CO2: 34 mmol/L — ABNORMAL HIGH (ref 22–32)
Calcium: 9 mg/dL (ref 8.9–10.3)
Chloride: 98 mmol/L (ref 98–111)
Creatinine, Ser: 1.07 mg/dL — ABNORMAL HIGH (ref 0.44–1.00)
GFR calc Af Amer: 56 mL/min — ABNORMAL LOW (ref >60)
GFR calc non Af Amer: 48 mL/min — ABNORMAL LOW (ref >60)
Glucose, Bld: 150 mg/dL — ABNORMAL HIGH (ref 70–99)
Potassium: 3.4 mmol/L — ABNORMAL LOW (ref 3.5–5.1)
Sodium: 144 mmol/L (ref 135–145)

## 2019-11-27 LAB — TSH: TSH: 0.918 u[IU]/mL (ref 0.350–4.500)

## 2019-11-27 LAB — CBC
HCT: 45.9 % (ref 36.0–46.0)
Hemoglobin: 13.9 g/dL (ref 12.0–15.0)
MCH: 30.4 pg (ref 26.0–34.0)
MCHC: 30.3 g/dL (ref 30.0–36.0)
MCV: 100.4 fL — ABNORMAL HIGH (ref 80.0–100.0)
Platelets: 230 10*3/uL (ref 150–400)
RBC: 4.57 MIL/uL (ref 3.87–5.11)
RDW: 12.3 % (ref 11.5–15.5)
WBC: 8.9 10*3/uL (ref 4.0–10.5)
nRBC: 0 % (ref 0.0–0.2)

## 2019-11-27 LAB — CK: Total CK: 285 U/L — ABNORMAL HIGH (ref 38–234)

## 2019-11-27 LAB — TROPONIN I (HIGH SENSITIVITY)
Troponin I (High Sensitivity): 125 ng/L (ref <18)
Troponin I (High Sensitivity): 126 ng/L (ref <18)

## 2019-11-27 MED ORDER — FAMOTIDINE 20 MG PO TABS: 20.0000 mg | ORAL_TABLET | Freq: Two times a day (BID) | ORAL | Status: DC | PRN

## 2019-11-27 MED ORDER — ENOXAPARIN SODIUM 40 MG/0.4ML ~~LOC~~ SOLN
40.0000 mg | SUBCUTANEOUS | Status: DC
  Administered 2019-11-28: 40 mg via SUBCUTANEOUS
  Filled 2019-11-27 (×2): qty 0.4

## 2019-11-27 MED ORDER — DICYCLOMINE HCL 20 MG PO TABS
20.0000 mg | ORAL_TABLET | Freq: Three times a day (TID) | ORAL | Status: DC | PRN
  Filled 2019-11-27: qty 1

## 2019-11-27 MED ORDER — ACETAMINOPHEN 325 MG PO TABS
650.0000 mg | ORAL_TABLET | Freq: Four times a day (QID) | ORAL | Status: DC | PRN
  Administered 2019-11-28: 650 mg via ORAL
  Filled 2019-11-27: qty 2

## 2019-11-27 MED ORDER — GABAPENTIN 300 MG PO CAPS
300.0000 mg | ORAL_CAPSULE | Freq: Once | ORAL | Status: AC
  Administered 2019-11-27: 300 mg via ORAL
  Filled 2019-11-27: qty 1

## 2019-11-27 MED ORDER — BUSPIRONE HCL 5 MG PO TABS
10.0000 mg | ORAL_TABLET | Freq: Two times a day (BID) | ORAL | Status: DC
  Administered 2019-11-27 – 2019-12-03 (×13): 10 mg via ORAL
  Filled 2019-11-27 (×13): qty 2

## 2019-11-27 MED ORDER — AMITRIPTYLINE HCL 10 MG PO TABS
10.0000 mg | ORAL_TABLET | Freq: Every evening | ORAL | Status: DC | PRN
  Administered 2019-11-27 – 2019-11-30 (×2): 20 mg via ORAL
  Filled 2019-11-27 (×4): qty 2

## 2019-11-27 MED ORDER — HYDRALAZINE HCL 20 MG/ML IJ SOLN
10.0000 mg | INTRAMUSCULAR | Status: DC | PRN
  Administered 2019-11-27 – 2019-11-29 (×3): 10 mg via INTRAVENOUS
  Filled 2019-11-27 (×3): qty 1

## 2019-11-27 MED ORDER — ONDANSETRON HCL 4 MG PO TABS: 4.0000 mg | ORAL_TABLET | Freq: Four times a day (QID) | ORAL | Status: DC | PRN

## 2019-11-27 MED ORDER — ASPIRIN EC 81 MG PO TBEC
81.0000 mg | DELAYED_RELEASE_TABLET | Freq: Every day | ORAL | Status: DC
  Administered 2019-11-27 – 2019-12-02 (×6): 81 mg via ORAL
  Filled 2019-11-27 (×6): qty 1

## 2019-11-27 MED ORDER — LUBIPROSTONE 8 MCG PO CAPS
8.0000 ug | ORAL_CAPSULE | Freq: Two times a day (BID) | ORAL | Status: DC
  Administered 2019-11-27 – 2019-12-03 (×11): 8 ug via ORAL
  Filled 2019-11-27 (×16): qty 1

## 2019-11-27 MED ORDER — REVEFENACIN 175 MCG/3ML IN SOLN: 3.0000 mL | Freq: Every day | RESPIRATORY_TRACT | Status: DC

## 2019-11-27 MED ORDER — ARFORMOTEROL TARTRATE 15 MCG/2ML IN NEBU
15.0000 ug | INHALATION_SOLUTION | Freq: Two times a day (BID) | RESPIRATORY_TRACT | Status: DC
  Administered 2019-11-27 – 2019-12-03 (×13): 15 ug via RESPIRATORY_TRACT
  Filled 2019-11-27 (×13): qty 2

## 2019-11-27 MED ORDER — BUDESONIDE 0.25 MG/2ML IN SUSP
0.2500 mg | Freq: Two times a day (BID) | RESPIRATORY_TRACT | Status: DC
  Administered 2019-11-27 – 2019-12-03 (×12): 0.25 mg via RESPIRATORY_TRACT
  Filled 2019-11-27 (×13): qty 2

## 2019-11-27 MED ORDER — ONDANSETRON HCL 4 MG/2ML IJ SOLN: 4.0000 mg | Freq: Four times a day (QID) | INTRAMUSCULAR | Status: DC | PRN

## 2019-11-27 MED ORDER — ALBUTEROL SULFATE (2.5 MG/3ML) 0.083% IN NEBU
3.0000 mL | INHALATION_SOLUTION | RESPIRATORY_TRACT | Status: DC | PRN
  Administered 2019-11-30: 14:00:00 3 mL via RESPIRATORY_TRACT
  Filled 2019-11-27: qty 3

## 2019-11-27 MED ORDER — ISOSORBIDE MONONITRATE ER 60 MG PO TB24
120.0000 mg | ORAL_TABLET | Freq: Every day | ORAL | Status: DC
  Administered 2019-11-27 – 2019-12-03 (×7): 120 mg via ORAL
  Filled 2019-11-27 (×7): qty 2

## 2019-11-27 MED ORDER — ALPRAZOLAM 0.5 MG PO TABS
0.5000 mg | ORAL_TABLET | Freq: Once | ORAL | Status: AC
  Administered 2019-11-27: 02:00:00 0.5 mg via ORAL
  Filled 2019-11-27: qty 1

## 2019-11-27 MED ORDER — GABAPENTIN 100 MG PO CAPS
200.0000 mg | ORAL_CAPSULE | Freq: Three times a day (TID) | ORAL | Status: DC
  Administered 2019-11-27 – 2019-11-29 (×8): 200 mg via ORAL
  Filled 2019-11-27 (×8): qty 2

## 2019-11-27 MED ORDER — PRAVASTATIN SODIUM 20 MG PO TABS: 40.0000 mg | ORAL_TABLET | Freq: Every evening | ORAL | Status: DC

## 2019-11-27 MED ORDER — SODIUM CHLORIDE 0.9 % IV SOLN: INTRAVENOUS | Status: DC

## 2019-11-27 MED ORDER — NITROGLYCERIN 0.4 MG SL SUBL: 0.4000 mg | SUBLINGUAL_TABLET | SUBLINGUAL | Status: DC | PRN

## 2019-11-27 MED ORDER — ACETAMINOPHEN 650 MG RE SUPP: 650.0000 mg | Freq: Four times a day (QID) | RECTAL | Status: DC | PRN

## 2019-11-27 NOTE — Progress Notes (Signed)
PT Cancellation Note  Patient Details Name: PRICIE MCLERRAN MRN: [id number] DOB: November 27, 1934   Cancelled Treatment:    Reason Eval/Treat Not Completed: Medical issues which prohibited therapy - pt awaiting stat MRI to r/o CVA. Will check back as medically appropriate.  [place] Pager [phone]  Office [phone]    Kirklin Mcduffee D Elonda Husky 11/27/2019, 10:09 AM

## 2019-11-27 NOTE — Progress Notes (Signed)
Called MRI and spoke with Darren, awaiting call back from [facility] regarding patients MRI.

## 2019-11-27 NOTE — Plan of Care (Signed)
Initiated care plan 

## 2019-11-27 NOTE — Progress Notes (Signed)
Patient has not voided, bladder scan reveals 226mL. Patient does not feel urge to void at this time. Dr. Cruzita Lederer made aware. Will continue to encourage patient to let us know when she needs to void.

## 2019-11-27 NOTE — Progress Notes (Signed)
Patient seen and examined this morning, admitted overnight, H&P reviewed and agree with assessment and plan.  This is an 85 year old female with CAD status post stents, COPD, CKD 3, HTN who was brought to the ER after she was found laying on her bed with urine and feces around her, unable to get out of bed for the past 2 days.  She was complaining of pain all over her body.  On my evaluation she appears confused.  She is alert but does not really answer any of my questions or her answers are quite tangential.  Acute encephalopathy/generalized weakness -MRI of the brain pending, TSH, CK  Hypertensive urgency -IV hydralazine as needed  CAD status post stenting -No chest pain, troponin flat  COPD -No wheezing  Chronic kidney disease stage IIIa -Creatinine appears at baseline  Desere Gwin M. Cruzita Lederer, MD, PhD [facility]  Between 7 am - 7 pm I am available, please contact me via Amion or Securechat Between 7 pm - 7 am I am not available, please contact night coverage MD/APP via Amion

## 2019-11-27 NOTE — H&P (Addendum)
History and Physical    [name] [id number] DOB: 1935-08-27 DOA: 11/26/2019  PCP: Leonard Downing, MD  Patient coming from: Home.  Chief Complaint: Weakness.  HPI: [name] is a 85 y.o. female with history of CAD status post multiple stents, COPD, chronic kidney disease stage III, hypertension was brought to the ER after patient's family found that patient was lying on the bed with urine and feces around unable to get out of bed for last 2 days.  Patient was complaining of pain all over the body.  Denies any shortness of breath or chest pain.  ED Course: In the ER patient was afebrile and usually uses 2 L oxygen was initially requiring 4 L went back to normal.  Blood pressure markedly elevated.  EKG shows nonspecific changes chest x-ray was showing chronic changes.  Labs are largely unremarkable Covid test was negative.  Patient on trying to ambulate was finding it very difficult to ambulate CT head and x-ray pelvis were unremarkable.  In the ER patient was given 1 L fluid bolus and due to anxiety 1 dose of Xanax p.o. was given and also patient's Neurontin.  Blood pressure was marked elevated was given IV hydralazine.  Patient admitted for further observation.  Review of Systems: As per HPI, rest all negative.   Past Medical History:  Diagnosis Date  . Arthritis    "in my spine" (04/15/2018)  . Blurred vision   . CAD (coronary artery disease)    Multiple percutaneous revascularization. Catheterization May 2013 left main normal, patent LAD stent, 90% circumflex stenosis, patent right coronary artery stents. Patient had a DES to the circumflex. The EF was well-preserved.  . Chronic lower back pain   . COPD (chronic obstructive pulmonary disease) ([place])   . Fatigue   . GERD (gastroesophageal reflux disease)   . Hard of hearing   . HTN (hypertension)   . Hx of radiation therapy    right nose 4500 cGy in 10 sessions over 5 weeks (2 treatments a week)  . Hyperlipemia     . NSTEMI (non-ST elevated myocardial infarction) ([place]) 08/28/2018  . On home oxygen therapy    "2L; 24/7" (08/27/2018)  . Pneumonia    "I've had a touch of it" (04/15/2018)  . Squamous cell carcinoma of nose    right    Past Surgical History:  Procedure Laterality Date  . ABDOMINAL HYSTERECTOMY    . BACK SURGERY  ?  . CORONARY ANGIOPLASTY WITH STENT PLACEMENT  2002-2009  . CORONARY ANGIOPLASTY WITH STENT PLACEMENT  05/06/12   "1; this makes 5"  . DILATION AND CURETTAGE OF UTERUS    . ESOPHAGEAL DILATION  ~ 2012   Dr. Deatra Ina  . LUMBAR SPINE SURGERY  04/2011   Rods, screws and cages  . PERCUTANEOUS CORONARY STENT INTERVENTION (PCI-S) N/A 05/06/2012   Procedure: PERCUTANEOUS CORONARY STENT INTERVENTION (PCI-S);  Surgeon: Burnell Blanks, MD;  Location: [facility] CATH LAB;  Service: Cardiovascular;  Laterality: N/A;  . SKIN BIOPSY  10/15/13   right nose-microinvasive squamous cell carcinoma  . TUBAL LIGATION       reports that she quit smoking about 7 years ago. Her smoking use included cigarettes. She has a 27.50 pack-year smoking history. She has never used smokeless tobacco. She reports that she does not drink alcohol or use drugs.  Allergies  Allergen Reactions  . Iohexol Other (See Comments)    Code:  HIVES, Desc:  PER ROBIN @ [facility], PT IS  ALLERGIC TO IVP DYE 08/28/10 RM  . Lipitor [Atorvastatin Calcium] Hives    Family History  Problem Relation Age of Onset  . Glaucoma Father   . Heart attack Father   . Bone cancer Mother     Prior to Admission medications   Medication Sig Start Date End Date Taking? Authorizing Provider  acetaminophen (TYLENOL 8 HOUR) 650 MG CR tablet Take 1 tablet (650 mg total) by mouth every 8 (eight) hours as needed. Patient taking differently: Take 650 mg by mouth every 8 (eight) hours as needed for pain or fever.  03/16/18  Yes Varney Biles, MD  albuterol (PROVENTIL HFA;VENTOLIN HFA) 108 (90 BASE) MCG/ACT inhaler Inhale 2 puffs into the lungs  every 4 (four) hours as needed for wheezing. 12/01/14  Yes Delfina Redwood, MD  AMITIZA 8 MCG capsule Take 8 mcg by mouth every 12 (twelve) hours. 11/21/19  Yes [provider]  amitriptyline (ELAVIL) 10 MG tablet Take 10-20 mg by mouth at bedtime as needed (depression).    Yes [provider]  aspirin 81 MG tablet Take 1 tablet (81 mg total) by mouth daily. With food 09/01/18  Yes Emokpae, Courage, MD  BROVANA 15 MCG/2ML NEBU USE 1 VIAL  IN  NEBULIZER TWICE  DAILY - morning and evening Patient taking differently: Take 15 mcg by nebulization 2 (two) times daily.  08/30/19  Yes Mannam, Praveen, MD  budesonide (PULMICORT) 0.25 MG/2ML nebulizer solution USE 1 VIAL  IN  NEBULIZER TWICE  DAILY - rinse mouth after treatment Patient taking differently: Take 0.25 mg by nebulization 2 (two) times daily.  08/30/19  Yes Mannam, Praveen, MD  busPIRone (BUSPAR) 10 MG tablet Take 1 tablet (10 mg total) by mouth 2 (two) times daily. 09/01/18  Yes Emokpae, Courage, MD  cholecalciferol (VITAMIN D) 1000 UNITS tablet Take 1,000 Units by mouth daily.    Yes [provider]  dicyclomine (BENTYL) 20 MG tablet Take 1 tablet (20 mg total) by mouth 3 (three) times daily as needed for spasms. Cramping abdominal pain. Patient taking differently: Take 20 mg by mouth 3 (three) times daily as needed (cramping abdominal pain).  08/16/17  Yes Charlesetta Shanks, MD  famotidine (PEPCID) 20 MG tablet Take 20 mg by mouth every 12 (twelve) hours as needed for heartburn.  12/11/18  Yes [provider]  furosemide (LASIX) 20 MG tablet Take 2 tablets (40 mg total) by mouth daily. 09/01/18 11/26/19 Yes Emokpae, Courage, MD  gabapentin (NEURONTIN) 300 MG capsule Take 300 mg by mouth every 6 (six) hours as needed for pain. 11/22/19  Yes [provider]  guaiFENesin (MUCINEX) 600 MG 12 hr tablet Take 1 tablet (600 mg total) by mouth 2 (two) times daily. 09/01/18  Yes Roxan Hockey, MD  isosorbide  mononitrate (IMDUR) 120 MG 24 hr tablet Take 1 tablet (120 mg total) by mouth daily. 04/14/19  Yes Minus Breeding, MD  nitroGLYCERIN (NITROSTAT) 0.4 MG SL tablet Place 0.4 mg under the tongue every 5 (five) minutes as needed for chest pain.   Yes [provider]  pantoprazole (PROTONIX) 40 MG tablet Take 40 mg by mouth daily as needed.  03/17/19  Yes [provider]  polyethylene glycol (MIRALAX) packet Take 17 g by mouth daily as needed for mild constipation or moderate constipation. 09/01/18  Yes Emokpae, Courage, MD  trolamine salicylate (ASPERCREME) 10 % cream Apply 1 application topically daily as needed for muscle pain.   Yes [provider]  YUPELRI 175 MCG/3ML SOLN  USE 1 VIAL IN NEBULIZER DAILY - DO NOT MIX WITH OTHER NEB MEDS,USE BEFORE OR AFTER Patient taking differently: Inhale 3 mLs into the lungs daily.  08/30/19  Yes Mannam, Praveen, MD  fluticasone (FLONASE) 50 MCG/ACT nasal spray Place 2 sprays into both nostrils daily as needed for allergies or rhinitis. Patient not taking: Reported on 11/26/2019 10/12/17   Cherene Altes, MD  pravastatin (PRAVACHOL) 40 MG tablet Take 1 tablet (40 mg total) by mouth every evening. Patient not taking: Reported on 11/26/2019 04/14/19 11/26/19  Minus Breeding, MD  predniSONE (DELTASONE) 20 MG tablet Take 2 tablets (40 mg total) by mouth daily with breakfast. Patient not taking: Reported on 11/26/2019 08/05/19   Marshell Garfinkel, MD    Physical Exam: Constitutional: Moderately built and nourished. Vitals:   11/26/19 1835 11/26/19 2149  BP: (!) 133/111 (!) 205/84  Pulse: 73 71  Resp: 18 18  Temp: 98.3 F (36.8 C)   TempSrc: Oral   SpO2: 97% 98%   Eyes: Anicteric no pallor. ENMT: No discharge from the ears eyes nose or mouth. Neck: No mass felt.  No neck rigidity. Respiratory: No rhonchi or crepitations. Cardiovascular: S1-S2 heard. Abdomen: Soft nontender bowel sounds present. Musculoskeletal: No edema.  No joint  effusion. Skin: No rash. Neurologic: Alert awake oriented time place and person.  Moves all extremities. Psychiatric: Appears normal per normal affect.   Labs on Admission: I have personally reviewed following labs and imaging studies  CBC: Recent Labs  Lab 11/26/19 1846  WBC 7.9  HGB 14.5  HCT 47.7*  MCV 99.8  PLT 123456   Basic Metabolic Panel: Recent Labs  Lab 11/26/19 1846  NA 144  K 3.6  CL 96*  CO2 34*  GLUCOSE 131*  BUN 21  CREATININE 1.19*  CALCIUM 9.6   GFR: CrCl cannot be calculated (Unknown ideal weight.). Liver Function Tests: Recent Labs  Lab 11/26/19 1846  AST 20  ALT 26  ALKPHOS 74  BILITOT 0.7  PROT 7.3  ALBUMIN 4.0   Recent Labs  Lab 11/26/19 1846  LIPASE 24   No results for input(s): AMMONIA in the last 168 hours. Coagulation Profile: No results for input(s): INR, PROTIME in the last 168 hours. Cardiac Enzymes: No results for input(s): CKTOTAL, CKMB, CKMBINDEX, TROPONINI in the last 168 hours. BNP (last 3 results) No results for input(s): PROBNP in the last 8760 hours. HbA1C: No results for input(s): HGBA1C in the last 72 hours. CBG: No results for input(s): GLUCAP in the last 168 hours. Lipid Profile: No results for input(s): CHOL, HDL, LDLCALC, TRIG, CHOLHDL, LDLDIRECT in the last 72 hours. Thyroid Function Tests: No results for input(s): TSH, T4TOTAL, FREET4, T3FREE, THYROIDAB in the last 72 hours. Anemia Panel: No results for input(s): VITAMINB12, FOLATE, FERRITIN, TIBC, IRON, RETICCTPCT in the last 72 hours. Urine analysis:    Component Value Date/Time   COLORURINE YELLOW 11/26/2019 2031   APPEARANCEUR CLEAR 11/26/2019 2031   LABSPEC 1.013 11/26/2019 2031   PHURINE 8.0 11/26/2019 2031   GLUCOSEU NEGATIVE 11/26/2019 2031   HGBUR NEGATIVE 11/26/2019 2031   BILIRUBINUR NEGATIVE 11/26/2019 2031   KETONESUR 20 (A) 11/26/2019 2031   PROTEINUR 30 (A) 11/26/2019 2031   UROBILINOGEN 0.2 11/28/2014 1557   NITRITE NEGATIVE  11/26/2019 2031   LEUKOCYTESUR NEGATIVE 11/26/2019 2031   Sepsis Labs: @LABRCNTIP (procalcitonin:4,lacticidven:4) ) Recent Results (from the past 240 hour(s))  Respiratory Panel by RT PCR (Flu A&B, Covid) - Nasopharyngeal Swab     Status: None  Collection Time: 11/26/19  9:38 PM   Specimen: Nasopharyngeal Swab  Result Value Ref Range Status   SARS Coronavirus 2 by RT PCR NEGATIVE NEGATIVE Final    Comment: (NOTE) SARS-CoV-2 target nucleic acids are NOT DETECTED. The SARS-CoV-2 RNA is generally detectable in upper respiratoy specimens during the acute phase of infection. The lowest concentration of SARS-CoV-2 viral copies this assay can detect is 131 copies/mL. A negative result does not preclude SARS-Cov-2 infection and should not be used as the sole basis for treatment or other patient management decisions. A negative result may occur with  improper specimen collection/handling, submission of specimen other than nasopharyngeal swab, presence of viral mutation(s) within the areas targeted by this assay, and inadequate number of viral copies (<131 copies/mL). A negative result must be combined with clinical observations, patient history, and epidemiological information. The expected result is Negative. Fact Sheet for Patients:  PinkCheek.be Fact Sheet for Healthcare Providers:  GravelBags.it This test is not yet ap proved or cleared by the [place] FDA and  has been authorized for detection and/or diagnosis of SARS-CoV-2 by FDA under an Emergency Use Authorization (EUA). This EUA will remain  in effect (meaning this test can be used) for the duration of the COVID-19 declaration under Section 564(b)(1) of the Act, 21 U.S.C. section 360bbb-3(b)(1), unless the authorization is terminated or revoked sooner.    Influenza A by PCR NEGATIVE NEGATIVE Final   Influenza B by PCR NEGATIVE NEGATIVE Final    Comment: (NOTE) The  Xpert Xpress SARS-CoV-2/FLU/RSV assay is intended as an aid in  the diagnosis of influenza from Nasopharyngeal swab specimens and  should not be used as a sole basis for treatment. Nasal washings and  aspirates are unacceptable for Xpert Xpress SARS-CoV-2/FLU/RSV  testing. Fact Sheet for Patients: PinkCheek.be Fact Sheet for Healthcare Providers: GravelBags.it This test is not yet approved or cleared by the [place] FDA and  has been authorized for detection and/or diagnosis of SARS-CoV-2 by  FDA under an Emergency Use Authorization (EUA). This EUA will remain  in effect (meaning this test can be used) for the duration of the  Covid-19 declaration under Section 564(b)(1) of the Act, 21  U.S.C. section 360bbb-3(b)(1), unless the authorization is  terminated or revoked. Performed at [facility], [place] [address] [place], [place] [address]      Radiological Exams on Admission: CT Head Wo Contrast  Result Date: 11/26/2019 CLINICAL DATA:  Ataxia, question stroke EXAM: CT HEAD WITHOUT CONTRAST TECHNIQUE: Contiguous axial images were obtained from the base of the skull through the vertex without intravenous contrast. COMPARISON:  November 24, 2018 FINDINGS: Brain: No evidence of acute territorial infarction, hemorrhage, hydrocephalus,extra-axial collection or mass lesion/mass effect. There is dilatation the ventricles and sulci consistent with age-related atrophy. Low-attenuation changes in the deep white matter consistent with small vessel ischemia. Prior lacunar infarct in the left basal ganglia. Vascular: No hyperdense vessel or unexpected calcification. Skull: The skull is intact. No fracture or focal lesion identified. Sinuses/Orbits: Again noted is opacification of the bilateral mastoid air cells. The orbits and globes intact. Other: None IMPRESSION: No acute intracranial abnormality. Findings consistent with age  related atrophy and chronic small vessel ischemia Small unchanged lacunar infarct in the left basal ganglia. Electronically Signed   By: Prudencio Pair M.D.   On: 11/26/2019 21:12   DG Pelvis Portable  Result Date: 11/26/2019 CLINICAL DATA:  Weakness EXAM: PORTABLE PELVIS 1-2 VIEWS COMPARISON:  None. FINDINGS: Pelvic ring is intact. No  acute fracture or dislocation is noted. Postsurgical changes in the lumbar spine are seen. No soft tissue abnormality is noted. IMPRESSION: No acute abnormality seen. Electronically Signed   By: Inez Catalina M.D.   On: 11/26/2019 20:33   DG Chest Port 1 View  Result Date: 11/26/2019 CLINICAL DATA:  Shortness of breath. EXAM: PORTABLE CHEST 1 VIEW COMPARISON:  November 24, 2018 FINDINGS: Mild, chronic-appearing increased interstitial lung markings are seen. This is unchanged in appearance when compared to the prior study. There is no evidence of a pleural effusion or pneumothorax. The cardiac silhouette is moderately enlarged. There is moderate severity calcification of the aortic arch. The visualized skeletal structures are unremarkable. IMPRESSION: 1. Chronic appearing increased interstitial lung markings without evidence of acute or active cardiopulmonary disease. Electronically Signed   By: Virgina Norfolk M.D.   On: 11/26/2019 20:33    EKG: Independently reviewed.  Normal sinus rhythm.  Assessment/Plan Active Problems:   Weakness generalized    1. Generalized weakness cause not clear likely deconditioning.  Recommend physical therapy consult and MRI brain.  Check TSH and CK levels. 2. Hypertensive urgency for which I have placed patient on as needed IV hydralazine for now.  Home medication dose has to be verified. 3. History of CAD status post stenting denies any chest pain.  No medication doses and not known if need to be completed.  And dosed accordingly. 4. COPD presently not wheezing continue home inhalers. 5. Chronic kidney disease stage III creatinine  appears to be at baseline. 6. Morbid obesity.  Patient's home medication doses has to be verified.   DVT prophylaxis: Lovenox. Code Status: Full code. Family Communication: Discussed with patient. Disposition Plan: May need rehab. Consults called: Physical therapy and transition of care. Admission status: Observation.   Rise Patience MD [facility] Pager [phone].  If 7PM-7AM, please contact night-coverage [email or web address] Password [facility]  11/27/2019, 12:38 AM

## 2019-11-27 NOTE — ED Notes (Signed)
ED TO INPATIENT HANDOFF REPORT  ED Nurse Name and Phone #: Gara Kroner, RN  S Name/Age/Gender [name] 85 y.o. female Room/Bed: WOTF/NONE  Code Status   Code Status: Full Code  Home/SNF/Other Home Patient oriented to: self, place, time and situation Is this baseline? Yes   Triage Complete: Triage complete  Chief Complaint Weakness generalized [R53.1]  Triage Note Per [facility] pt from home where her family found her covered in her urine for the past 2 days. Reports lives by herself. Pt moaning and yelling during triage. Pt is on continuous O2 of 4L. Pt c/o entire body hurts x 3 years CBG 124, 182/98, 96%, 78HR,     Allergies Allergies  Allergen Reactions  . Iohexol Other (See Comments)    Code:  HIVES, Desc:  PER ROBIN @ [facility], PT IS ALLERGIC TO IVP DYE 08/28/10 RM  . Lipitor [Atorvastatin Calcium] Hives    Level of Care/Admitting Diagnosis ED Disposition    ED Disposition Condition Comment   Admit  Hospital Area: [facility] [[id number]]  Level of Care: Telemetry [5]  Admit to tele based on following criteria: Monitor for Ischemic changes  Diagnosis: Weakness generalized [992426]  Admitting Physician: Rise Patience [phone]  Attending Physician: Rise Patience [phone]  PT Class (Do Not Modify): Observation [104]  PT Acc Code (Do Not Modify): Observation [10022]       B Medical/Surgery History Past Medical History:  Diagnosis Date  . Arthritis    "in my spine" (04/15/2018)  . Blurred vision   . CAD (coronary artery disease)    Multiple percutaneous revascularization. Catheterization May 2013 left main normal, patent LAD stent, 90% circumflex stenosis, patent right coronary artery stents. Patient had a DES to the circumflex. The EF was well-preserved.  . Chronic lower back pain   . COPD (chronic obstructive pulmonary disease) ([place])   . Fatigue   . GERD (gastroesophageal reflux disease)   . Hard of hearing   . HTN (hypertension)    . Hx of radiation therapy    right nose 4500 cGy in 10 sessions over 5 weeks (2 treatments a week)  . Hyperlipemia   . NSTEMI (non-ST elevated myocardial infarction) ([place]) 08/28/2018  . On home oxygen therapy    "2L; 24/7" (08/27/2018)  . Pneumonia    "I've had a touch of it" (04/15/2018)  . Squamous cell carcinoma of nose    right   Past Surgical History:  Procedure Laterality Date  . ABDOMINAL HYSTERECTOMY    . BACK SURGERY  ?  . CORONARY ANGIOPLASTY WITH STENT PLACEMENT  2002-2009  . CORONARY ANGIOPLASTY WITH STENT PLACEMENT  05/06/12   "1; this makes 5"  . DILATION AND CURETTAGE OF UTERUS    . ESOPHAGEAL DILATION  ~ 2012   Dr. Deatra Ina  . LUMBAR SPINE SURGERY  04/2011   Rods, screws and cages  . PERCUTANEOUS CORONARY STENT INTERVENTION (PCI-S) N/A 05/06/2012   Procedure: PERCUTANEOUS CORONARY STENT INTERVENTION (PCI-S);  Surgeon: Burnell Blanks, MD;  Location: [facility] CATH LAB;  Service: Cardiovascular;  Laterality: N/A;  . SKIN BIOPSY  10/15/13   right nose-microinvasive squamous cell carcinoma  . TUBAL LIGATION       A IV Location/Drains/Wounds Patient Lines/Drains/Airways Status   Active Line/Drains/Airways    Name:   Placement date:   Placement time:   Site:   Days:   Peripheral IV 11/26/19 Right Antecubital   11/26/19    1946    Antecubital  1          Intake/Output Last 24 hours No intake or output data in the 24 hours ending 11/27/19 0255  Labs/Imaging Results for orders placed or performed during the hospital encounter of 11/26/19 (from the past 48 hour(s))  Lipase, blood     Status: None   Collection Time: 11/26/19  6:46 PM  Result Value Ref Range   Lipase 24 11 - 51 U/L    Comment: Performed at [facility], [place] [address] [place], [place] [address]  Comprehensive metabolic panel     Status: Abnormal   Collection Time: 11/26/19  6:46 PM  Result Value Ref Range   Sodium 144 135 - 145 mmol/L   Potassium 3.6 3.5 - 5.1 mmol/L    Chloride 96 (L) 98 - 111 mmol/L   CO2 34 (H) 22 - 32 mmol/L   Glucose, Bld 131 (H) 70 - 99 mg/dL   BUN 21 8 - 23 mg/dL   Creatinine, Ser 1.19 (H) 0.44 - 1.00 mg/dL   Calcium 9.6 8.9 - 10.3 mg/dL   Total Protein 7.3 6.5 - 8.1 g/dL   Albumin 4.0 3.5 - 5.0 g/dL   AST 20 15 - 41 U/L   ALT 26 0 - 44 U/L   Alkaline Phosphatase 74 38 - 126 U/L   Total Bilirubin 0.7 0.3 - 1.2 mg/dL   GFR calc non Af Amer 42 (L) >60 mL/min   GFR calc Af Amer 49 (L) >60 mL/min   Anion gap 14 5 - 15    Comment: Performed at [facility], [place] [address], [place], [place] [address]  CBC     Status: Abnormal   Collection Time: 11/26/19  6:46 PM  Result Value Ref Range   WBC 7.9 4.0 - 10.5 K/uL   RBC 4.78 3.87 - 5.11 MIL/uL   Hemoglobin 14.5 12.0 - 15.0 g/dL   HCT 47.7 (H) 36.0 - 46.0 %   MCV 99.8 80.0 - 100.0 fL   MCH 30.3 26.0 - 34.0 pg   MCHC 30.4 30.0 - 36.0 g/dL   RDW 12.1 11.5 - 15.5 %   Platelets 256 150 - 400 K/uL   nRBC 0.0 0.0 - 0.2 %    Comment: Performed at [facility], [place] [address], [place], [place] [address]  Troponin I (High Sensitivity)     Status: Abnormal   Collection Time: 11/26/19  7:49 PM  Result Value Ref Range   Troponin I (High Sensitivity) 101 (HH) <18 ng/L    Comment: CRITICAL RESULT CALLED TO, READ BACK BY AND VERIFIED WITH: Sabryn Preslar, RN ON 11/26/19 @ 2200 BY LE (NOTE) Elevated high sensitivity troponin I (hsTnI) values and significant  changes across serial measurements may suggest ACS but many other  chronic and acute conditions are known to elevate hsTnI results.  Refer to the Links section for chest pain algorithms and additional  guidance. Performed at [facility], [place] [address], [place], [place] [address]   Urinalysis, Routine w reflex microscopic     Status: Abnormal   Collection Time: 11/26/19  8:31 PM  Result Value Ref Range   Color, Urine YELLOW YELLOW   APPearance CLEAR CLEAR   Specific Gravity, Urine  1.013 1.005 - 1.030   pH 8.0 5.0 - 8.0   Glucose, UA NEGATIVE NEGATIVE mg/dL   Hgb urine dipstick NEGATIVE NEGATIVE   Bilirubin Urine NEGATIVE NEGATIVE   Ketones, ur 20 (A) NEGATIVE mg/dL  Protein, ur 30 (A) NEGATIVE mg/dL   Nitrite NEGATIVE NEGATIVE   Leukocytes,Ua NEGATIVE NEGATIVE   WBC, UA 0-5 0 - 5 WBC/hpf   Bacteria, UA NONE SEEN NONE SEEN   Squamous Epithelial / LPF 0-5 0 - 5   Mucus PRESENT     Comment: Performed at [facility], [place] [address], [place],  [address]  POC SARS Coronavirus 2 Ag-ED - Nasal Swab (BD Veritor Kit)     Status: None   Collection Time: 11/26/19  8:50 PM  Result Value Ref Range   SARS Coronavirus 2 Ag NEGATIVE NEGATIVE    Comment: (NOTE) SARS-CoV-2 antigen NOT DETECTED.  Negative results are presumptive.  Negative results do not preclude SARS-CoV-2 infection and should not be used as the sole basis for treatment or other patient management decisions, including infection  control decisions, particularly in the presence of clinical signs and  symptoms consistent with COVID-19, or in those who have been in contact with the virus.  Negative results must be combined with clinical observations, patient history, and epidemiological information. The expected result is Negative. Fact Sheet for Patients: PodPark.tn Fact Sheet for Healthcare Providers: GiftContent.is This test is not yet approved or cleared by the [place] FDA and  has been authorized for detection and/or diagnosis of SARS-CoV-2 by FDA under an Emergency Use Authorization (EUA).  This EUA will remain in effect (meaning this test can be used) for the duration of  the COVID-19 de claration under Section 564(b)(1) of the Act, 21 U.S.C. section 360bbb-3(b)(1), unless the authorization is terminated or revoked sooner.   Respiratory Panel by RT PCR (Flu A&B, Covid) - Nasopharyngeal Swab     Status: None    Collection Time: 11/26/19  9:38 PM   Specimen: Nasopharyngeal Swab  Result Value Ref Range   SARS Coronavirus 2 by RT PCR NEGATIVE NEGATIVE    Comment: (NOTE) SARS-CoV-2 target nucleic acids are NOT DETECTED. The SARS-CoV-2 RNA is generally detectable in upper respiratoy specimens during the acute phase of infection. The lowest concentration of SARS-CoV-2 viral copies this assay can detect is 131 copies/mL. A negative result does not preclude SARS-Cov-2 infection and should not be used as the sole basis for treatment or other patient management decisions. A negative result may occur with  improper specimen collection/handling, submission of specimen other than nasopharyngeal swab, presence of viral mutation(s) within the areas targeted by this assay, and inadequate number of viral copies (<131 copies/mL). A negative result must be combined with clinical observations, patient history, and epidemiological information. The expected result is Negative. Fact Sheet for Patients:  PinkCheek.be Fact Sheet for Healthcare Providers:  GravelBags.it This test is not yet ap proved or cleared by the [place] FDA and  has been authorized for detection and/or diagnosis of SARS-CoV-2 by FDA under an Emergency Use Authorization (EUA). This EUA will remain  in effect (meaning this test can be used) for the duration of the COVID-19 declaration under Section 564(b)(1) of the Act, 21 U.S.C. section 360bbb-3(b)(1), unless the authorization is terminated or revoked sooner.    Influenza A by PCR NEGATIVE NEGATIVE   Influenza B by PCR NEGATIVE NEGATIVE    Comment: (NOTE) The Xpert Xpress SARS-CoV-2/FLU/RSV assay is intended as an aid in  the diagnosis of influenza from Nasopharyngeal swab specimens and  should not be used as a sole basis for treatment. Nasal washings and  aspirates are unacceptable for Xpert Xpress SARS-CoV-2/FLU/RSV   testing. Fact Sheet for Patients: PinkCheek.be Fact  Sheet for Healthcare Providers: GravelBags.it This test is not yet approved or cleared by the [place] and  has been authorized for detection and/or diagnosis of SARS-CoV-2 by  FDA under an Emergency Use Authorization (EUA). This EUA will remain  in effect (meaning this test can be used) for the duration of the  Covid-19 declaration under Section 564(b)(1) of the Act, 21  U.S.C. section 360bbb-3(b)(1), unless the authorization is  terminated or revoked. Performed at [facility], [place] [address] [place], [place] [address]   Troponin I (High Sensitivity)     Status: Abnormal   Collection Time: 11/26/19  9:46 PM  Result Value Ref Range   Troponin I (High Sensitivity) 134 (HH) <18 ng/L    Comment: CRITICAL VALUE NOTED.  VALUE IS CONSISTENT WITH PREVIOUSLY REPORTED AND CALLED VALUE. (NOTE) Elevated high sensitivity troponin I (hsTnI) values and significant  changes across serial measurements may suggest ACS but many other  chronic and acute conditions are known to elevate hsTnI results.  Refer to the Links section for chest pain algorithms and additional  guidance. Performed at [facility], [place] [address] [place],  [address]    CT Head Wo Contrast  Result Date: 11/26/2019 CLINICAL DATA:  Ataxia, question stroke EXAM: CT HEAD WITHOUT CONTRAST TECHNIQUE: Contiguous axial images were obtained from the base of the skull through the vertex without intravenous contrast. COMPARISON:  November 24, 2018 FINDINGS: Brain: No evidence of acute territorial infarction, hemorrhage, hydrocephalus,extra-axial collection or mass lesion/mass effect. There is dilatation the ventricles and sulci consistent with age-related atrophy. Low-attenuation changes in the deep white matter consistent with small vessel ischemia. Prior lacunar infarct in the  left basal ganglia. Vascular: No hyperdense vessel or unexpected calcification. Skull: The skull is intact. No fracture or focal lesion identified. Sinuses/Orbits: Again noted is opacification of the bilateral mastoid air cells. The orbits and globes intact. Other: None IMPRESSION: No acute intracranial abnormality. Findings consistent with age related atrophy and chronic small vessel ischemia Small unchanged lacunar infarct in the left basal ganglia. Electronically Signed   By: Prudencio Pair M.D.   On: 11/26/2019 21:12   DG Pelvis Portable  Result Date: 11/26/2019 CLINICAL DATA:  Weakness EXAM: PORTABLE PELVIS 1-2 VIEWS COMPARISON:  None. FINDINGS: Pelvic ring is intact. No acute fracture or dislocation is noted. Postsurgical changes in the lumbar spine are seen. No soft tissue abnormality is noted. IMPRESSION: No acute abnormality seen. Electronically Signed   By: Inez Catalina M.D.   On: 11/26/2019 20:33   DG Chest Port 1 View  Result Date: 11/26/2019 CLINICAL DATA:  Shortness of breath. EXAM: PORTABLE CHEST 1 VIEW COMPARISON:  November 24, 2018 FINDINGS: Mild, chronic-appearing increased interstitial lung markings are seen. This is unchanged in appearance when compared to the prior study. There is no evidence of a pleural effusion or pneumothorax. The cardiac silhouette is moderately enlarged. There is moderate severity calcification of the aortic arch. The visualized skeletal structures are unremarkable. IMPRESSION: 1. Chronic appearing increased interstitial lung markings without evidence of acute or active cardiopulmonary disease. Electronically Signed   By: Virgina Norfolk M.D.   On: 11/26/2019 20:33    Pending Labs Unresulted Labs (From admission, onward)    Start     Ordered   12/04/19 0500  Creatinine, serum  (enoxaparin (LOVENOX)    CrCl >/= 30 ml/min)  Weekly,   R    Comments: while on enoxaparin therapy    11/27/19 0035   11/27/19  0721  Basic metabolic panel  Tomorrow morning,   R      11/27/19 0035   11/27/19 0500  CBC  Tomorrow morning,   R     11/27/19 0035   11/27/19 0036  CK  Once,   STAT     11/27/19 0035   11/27/19 0036  TSH  Once,   STAT     11/27/19 0035   11/27/19 0035  CBC  (enoxaparin (LOVENOX)    CrCl >/= 30 ml/min)  Once,   STAT    Comments: Baseline for enoxaparin therapy IF NOT ALREADY DRAWN.  Notify MD if PLT < 100 K.    11/27/19 0035   11/27/19 0035  Creatinine, serum  (enoxaparin (LOVENOX)    CrCl >/= 30 ml/min)  Once,   STAT    Comments: Baseline for enoxaparin therapy IF NOT ALREADY DRAWN.    11/27/19 0035          Vitals/Pain Today's Vitals   11/26/19 1835 11/26/19 2149 11/27/19 0128 11/27/19 0253  BP: (!) 133/111 (!) 205/84 (!) 185/139 (!) 148/119  Pulse: 73 71 (!) 117 (!) 115  Resp: '18 18 18 18  '$ Temp: 98.3 F (36.8 C)     TempSrc: Oral     SpO2: 97% 98% 97% 95%    Isolation Precautions No active isolations  Medications Medications  sodium chloride flush (NS) 0.9 % injection 3 mL (0 mLs Intravenous Hold 11/26/19 1953)  aspirin EC tablet 81 mg (has no administration in time range)  isosorbide mononitrate (IMDUR) 24 hr tablet 120 mg (has no administration in time range)  nitroGLYCERIN (NITROSTAT) SL tablet 0.4 mg (has no administration in time range)  amitriptyline (ELAVIL) tablet 10-20 mg (has no administration in time range)  busPIRone (BUSPAR) tablet 10 mg (has no administration in time range)  lubiprostone (AMITIZA) capsule 8 mcg (has no administration in time range)  dicyclomine (BENTYL) tablet 20 mg (has no administration in time range)  famotidine (PEPCID) tablet 20 mg (has no administration in time range)  albuterol (PROVENTIL) (2.5 MG/3ML) 0.083% nebulizer solution 3 mL (has no administration in time range)  arformoterol (BROVANA) nebulizer solution 15 mcg (has no administration in time range)  budesonide (PULMICORT) nebulizer solution 0.25 mg (has no administration in time range)  Revefenacin SOLN 3 mL (has no  administration in time range)  acetaminophen (TYLENOL) tablet 650 mg (has no administration in time range)    Or  acetaminophen (TYLENOL) suppository 650 mg (has no administration in time range)  ondansetron (ZOFRAN) tablet 4 mg (has no administration in time range)    Or  ondansetron (ZOFRAN) injection 4 mg (has no administration in time range)  enoxaparin (LOVENOX) injection 40 mg (has no administration in time range)  hydrALAZINE (APRESOLINE) injection 10 mg (10 mg Intravenous Given 11/27/19 0156)  sodium chloride 0.9 % bolus 1,000 mL (0 mLs Intravenous Stopped 11/26/19 2144)  ALPRAZolam (XANAX) tablet 0.5 mg (0.5 mg Oral Given 11/27/19 0225)  gabapentin (NEURONTIN) capsule 300 mg (300 mg Oral Given 11/27/19 0248)    Mobility walks with device High fall risk   Focused Assessments N/A   R Recommendations: See Admitting Provider Note  Report given to:   Additional Notes: Gara Kroner, RN

## 2019-11-27 NOTE — Evaluation (Signed)
Physical Therapy Evaluation Patient Details Name: [name] MRN: [id number] DOB: 13-Mar-1935 Today's Date: 11/27/2019   History of Present Illness  85 yo female admitted to ED on 1/8 after family found pt covered in urine and too weak to ambulate at home. MRI negative for acute abnormality. PMH includes OA, CAD, COPD, HOH, HTN, CAD with history of NSTEMI and stenting, depression.  Clinical Impression   Pt presents with generalized weakness, difficulty performing bed mobility and transfer to standing, fair to poor sitting balance with very poor standing balance, and inability to progress to ambulation due to LE weakness and anxiety. Pt to benefit from acute PT to address deficits. Pt required mod-max assist for bed mobility and transfer to standing, unable to maintain standing due to LE weakness and severe UE and LE trembling. PT recommending SNF, although pt states "no, I want to go home". Per family report via chart review,  Pt lives alone and it is definitely unsafe for pt to d/c home mobility-wise. PT to progress mobility as tolerated, and will continue to follow acutely.      Follow Up Recommendations SNF;Supervision/Assistance - 24 hour    Equipment Recommendations  None recommended by PT    Recommendations for Other Services       Precautions / Restrictions Precautions Precautions: Fall Precaution Comments: on 2LO2 chronically Restrictions Weight Bearing Restrictions: No      Mobility  Bed Mobility Overal bed mobility: Needs Assistance Bed Mobility: Supine to Sit;Sit to Supine     Supine to sit: Mod assist;HOB elevated Sit to supine: Max assist;HOB elevated   General bed mobility comments: mod-max assist for supine<>sit for trunk and LE management, scooting to and from EOB.  Transfers Overall transfer level: Needs assistance Equipment used: Rolling walker (2 wheeled) Transfers: Sit to/from Stand Sit to Stand: Max assist;From elevated surface         General  transfer comment: max assist for power up, steadying, trunk extension to upright. Pt quickly lowered back to bed with outburst, refused reattempt.  Ambulation/Gait             General Gait Details: unable  Stairs            Wheelchair Mobility    Modified Rankin (Stroke Patients Only)       Balance Overall balance assessment: Needs assistance;History of Falls Sitting-balance support: Bilateral upper extremity supported;Feet supported Sitting balance-Leahy Scale: Fair Sitting balance - Comments: able to sit EOB ~10 minutes, ate sandwich with forearms supported on table   Standing balance support: Bilateral upper extremity supported Standing balance-Leahy Scale: Poor Standing balance comment: reliant on external support                             Pertinent Vitals/Pain Pain Assessment: Faces Faces Pain Scale: Hurts even more Pain Location: back Pain Descriptors / Indicators: Sore;Discomfort;Crying;Moaning Pain Intervention(s): Limited activity within patient's tolerance;Monitored during session;Repositioned    Home Living Family/patient expects to be discharged to:: Private residence Living Arrangements: Alone Available Help at Discharge: Family;Available PRN/intermittently Type of Home: Mobile home Home Access: Stairs to enter Entrance Stairs-Rails: Left;Right;Can reach both Entrance Stairs-Number of Steps: 5 Home Layout: One level Home Equipment: Walker - 4 wheels;Bedside commode;Walker - 2 wheels;Shower seat;Hand held shower head;Adaptive equipment Additional Comments: family report is pt lives alone, per pt she lives with her younger sister    Prior Function Level of Independence: Needs assistance   Gait / Transfers Assistance  Needed: pt reports using RW for ambulation PTA  ADL's / Homemaking Assistance Needed: pt's sister helps with dressing and bathing, pt states her son brings food for them. Pt's sister lives with her  Comments: on 2LO2  chronically     Hand Dominance   Dominant Hand: Right    Extremity/Trunk Assessment   Upper Extremity Assessment Upper Extremity Assessment: Generalized weakness    Lower Extremity Assessment Lower Extremity Assessment: Generalized weakness    Cervical / Trunk Assessment Cervical / Trunk Assessment: Kyphotic  Communication   Communication: HOH  Cognition Arousal/Alertness: Awake/alert Behavior During Therapy: Anxious Overall Cognitive Status: Impaired/Different from baseline Area of Impairment: Orientation;Following commands;Safety/judgement;Problem solving                 Orientation Level: Disoriented to;Situation;Time     Following Commands: Follows one step commands consistently Safety/Judgement: Decreased awareness of safety;Decreased awareness of deficits   Problem Solving: Difficulty sequencing;Requires verbal cues;Requires tactile cues General Comments: Pt able to oriented to self and location, states year and month correctly but then states she was admitted from "an [place] party where I was cleaning feces off a toilet, then I couldn't get up". Pt very HOH, but follows commands well. Pt trembling and states she takes something for her tremblers, does not know what kind of medication it is.      General Comments General comments (skin integrity, edema, etc.): sats 99% on 2-3LO2 during session, HR 71 immediately post-transfer    Exercises     Assessment/Plan    PT Assessment Patient needs continued PT services  PT Problem List Decreased strength;Decreased mobility;Decreased activity tolerance;Decreased balance;Decreased knowledge of use of DME;Pain;Obesity;Decreased safety awareness;Decreased cognition       PT Treatment Interventions DME instruction;Therapeutic activities;Gait training;Therapeutic exercise;Patient/family education;Balance training;Stair training;Functional mobility training    PT Goals (Current goals can be found in the Care Plan  section)  Acute Rehab PT Goals Patient Stated Goal: go home PT Goal Formulation: With patient Time For Goal Achievement: 12/11/19 Potential to Achieve Goals: Fair    Frequency Min 2X/week   Barriers to discharge Decreased caregiver support      Co-evaluation               AM-PAC PT "6 Clicks" Mobility  Outcome Measure Help needed turning from your back to your side while in a flat bed without using bedrails?: A Lot Help needed moving from lying on your back to sitting on the side of a flat bed without using bedrails?: A Lot Help needed moving to and from a bed to a chair (including a wheelchair)?: Total Help needed standing up from a chair using your arms (e.g., wheelchair or bedside chair)?: Total Help needed to walk in hospital room?: Total Help needed climbing 3-5 steps with a railing? : Total 6 Click Score: 8    End of Session Equipment Utilized During Treatment: Gait belt;Oxygen Activity Tolerance: Patient limited by fatigue;Patient limited by pain Patient left: in bed;with call bell/phone within reach;with bed alarm set Nurse Communication: Mobility status PT Visit Diagnosis: Difficulty in walking, not elsewhere classified (R26.2);Unsteadiness on feet (R26.81);Muscle weakness (generalized) (M62.81)    Time: 1500-1530 PT Time Calculation (min) (ACUTE ONLY): 30 min   Charges:   PT Evaluation $PT Eval Low Complexity: 1 Low PT Treatments $Therapeutic Activity: 8-22 mins        Ayleah Hofmeister E, PT Acute Rehabilitation Services Pager [phone]  Office [phone]   Nicci Vaughan D Elonda Husky 11/27/2019, 5:39 PM

## 2019-11-28 DIAGNOSIS — I480 Paroxysmal atrial fibrillation: Secondary | ICD-10-CM

## 2019-11-28 DIAGNOSIS — G9341 Metabolic encephalopathy: Secondary | ICD-10-CM

## 2019-11-28 DIAGNOSIS — I10 Essential (primary) hypertension: Secondary | ICD-10-CM

## 2019-11-28 LAB — TROPONIN I (HIGH SENSITIVITY): Troponin I (High Sensitivity): 98 ng/L — ABNORMAL HIGH (ref <18)

## 2019-11-28 LAB — CBC
HCT: 44.3 % (ref 36.0–46.0)
Hemoglobin: 13.6 g/dL (ref 12.0–15.0)
MCH: 30.7 pg (ref 26.0–34.0)
MCHC: 30.7 g/dL (ref 30.0–36.0)
MCV: 100 fL (ref 80.0–100.0)
Platelets: 213 10*3/uL (ref 150–400)
RBC: 4.43 MIL/uL (ref 3.87–5.11)
RDW: 12.3 % (ref 11.5–15.5)
WBC: 7.9 10*3/uL (ref 4.0–10.5)
nRBC: 0 % (ref 0.0–0.2)

## 2019-11-28 LAB — COMPREHENSIVE METABOLIC PANEL
ALT: 19 U/L (ref 0–44)
AST: 19 U/L (ref 15–41)
Albumin: 3.4 g/dL — ABNORMAL LOW (ref 3.5–5.0)
Alkaline Phosphatase: 57 U/L (ref 38–126)
Anion gap: 12 (ref 5–15)
BUN: 22 mg/dL (ref 8–23)
CO2: 31 mmol/L (ref 22–32)
Calcium: 9 mg/dL (ref 8.9–10.3)
Chloride: 101 mmol/L (ref 98–111)
Creatinine, Ser: 1.06 mg/dL — ABNORMAL HIGH (ref 0.44–1.00)
GFR calc Af Amer: 57 mL/min — ABNORMAL LOW (ref >60)
GFR calc non Af Amer: 49 mL/min — ABNORMAL LOW (ref >60)
Glucose, Bld: 131 mg/dL — ABNORMAL HIGH (ref 70–99)
Potassium: 3.3 mmol/L — ABNORMAL LOW (ref 3.5–5.1)
Sodium: 144 mmol/L (ref 135–145)
Total Bilirubin: 0.5 mg/dL (ref 0.3–1.2)
Total Protein: 6.1 g/dL — ABNORMAL LOW (ref 6.5–8.1)

## 2019-11-28 MED ORDER — APIXABAN 5 MG PO TABS
5.0000 mg | ORAL_TABLET | Freq: Two times a day (BID) | ORAL | Status: DC
  Administered 2019-11-28 – 2019-12-03 (×10): 5 mg via ORAL
  Filled 2019-11-28 (×11): qty 1

## 2019-11-28 MED ORDER — METOPROLOL TARTRATE 5 MG/5ML IV SOLN: 5.0000 mg | Freq: Once | INTRAVENOUS | Status: AC

## 2019-11-28 MED ORDER — METOPROLOL TARTRATE 5 MG/5ML IV SOLN
INTRAVENOUS | Status: AC
  Administered 2019-11-28: 07:00:00 5 mg via INTRAVENOUS
  Filled 2019-11-28: qty 5

## 2019-11-28 NOTE — Progress Notes (Signed)
ANTICOAGULATION CONSULT NOTE - Initial Consult  Pharmacy Consult for apixaban Indication: atrial fibrillation  Allergies  Allergen Reactions  . Iohexol Other (See Comments)    Code:  HIVES, Desc:  PER ROBIN @ [facility], PT IS ALLERGIC TO IVP DYE 08/28/10 RM  . Lipitor [Atorvastatin Calcium] Hives    Patient Measurements: Height: 5\' 4"  (162.6 cm) Weight: 207 lb 0.2 oz (93.9 kg) IBW/kg (Calculated) : 54.7  Vital Signs: Temp: 98.1 F (36.7 C) (01/10 0537) Temp Source: Oral (01/10 0537) BP: 123/70 (01/10 0650) Pulse Rate: 105 (01/10 0650)  Labs: Recent Labs    11/26/19 1846 11/27/19 0516 11/27/19 0744 11/28/19 0512 11/28/19 0700  HGB 14.5 13.9  --  13.6  --   HCT 47.7* 45.9  --  44.3  --   PLT 256 230  --  213  --   CREATININE 1.19* 1.07*  --  1.06*  --   CKTOTAL  --  285*  --   --   --   TROPONINIHS  --  125* 126*  --  98*    Estimated Creatinine Clearance: 45.5 mL/min (A) (by C-G formula based on SCr of 1.06 mg/dL (H)).   Medical History: Past Medical History:  Diagnosis Date  . Arthritis    "in my spine" (04/15/2018)  . Blurred vision   . CAD (coronary artery disease)    Multiple percutaneous revascularization. Catheterization May 2013 left main normal, patent LAD stent, 90% circumflex stenosis, patent right coronary artery stents. Patient had a DES to the circumflex. The EF was well-preserved.  . Chronic lower back pain   . COPD (chronic obstructive pulmonary disease) ([place])   . Fatigue   . GERD (gastroesophageal reflux disease)   . Hard of hearing   . HTN (hypertension)   . Hx of radiation therapy    right nose 4500 cGy in 10 sessions over 5 weeks (2 treatments a week)  . Hyperlipemia   . NSTEMI (non-ST elevated myocardial infarction) ([place]) 08/28/2018  . On home oxygen therapy    "2L; 24/7" (08/27/2018)  . Pneumonia    "I've had a touch of it" (04/15/2018)  . Squamous cell carcinoma of nose    right    Medications:  Scheduled:  . arformoterol  15 mcg  Nebulization BID  . aspirin EC  81 mg Oral Q supper  . budesonide  0.25 mg Nebulization BID  . busPIRone  10 mg Oral BID  . gabapentin  200 mg Oral TID  . isosorbide mononitrate  120 mg Oral Daily  . lubiprostone  8 mcg Oral Q12H  . Revefenacin  3 mL Inhalation Daily  . sodium chloride flush  3 mL Intravenous Once   Infusions:    Assessment: 85 yo female with Afib to start apixaban per Md orders. Patient does not meet criteria for reduced dose apixaban as must have 2 of 3 of the following criteria: age>80, wt<60kg and Scr>1.5. Note patient received Lovenox 40mg  this AM around 1000. CBC ok.   Goal of Therapy:  Monitor renal function, CBC Monitor platelets by anticoagulation protocol: Yes   Plan:  1) Apixaban 5mg  BID 2) Will provide education prior to discharge  Kara Mead 11/28/2019,2:30 PM

## 2019-11-28 NOTE — Progress Notes (Signed)
PROGRESS NOTE  [name] [id number] DOB: 1934/12/04 DOA: 11/26/2019 PCP: Leonard Downing, MD   LOS: 1 day   Brief Narrative / Interim history: 85 year old female with CAD status post stents, COPD, CKD 3, HTN who was brought to the ER after she was found laying on her bed with urine and feces around her, unable to get out of bed for the past 2 days.  She was complaining of pain all over her body.  On my evaluation after admission she appears confused.  She is alert but does not really answer any of my questions or her answers are quite tangential.  Subjective / 24h Interval events: Continues to complain of pain all over, but is alert and oriented today and does not appear to be confused  Assessment & Plan: Principal Problem Acute metabolic encephalopathy/generalized weakness -MRI of the brain was negative for acute stroke however did show some old lacunar strokes -Infectious work-up negative, urinalysis, chest x-ray all unremarkable.  She is being monitored off antibiotics and remains afebrile and without leukocytosis -TSH was normal at 0.9 -Son mentioned to me that patient has been having episodes like this in the past, the usually last 1 day and then she returns to normal -With all the work-up negative possibly medication induced, family is helping and assisting with her medication except for the gabapentin which patient keeps by bedside and apparently has 2 bottles next to where she sleeps.  She was placed here on low-dose scheduled gabapentin and seems to be improving  Active Problems Paroxysmal A. Fib, new -Patient reported intermittent fluttering and hearing are heartbeat even at home on several occasions in the past.  This morning she developed A. fib with RVR and converted right back to sinus rhythm -Start Eliquis,CHADSVASC score is 5 -Currently in sinus, heart rate in the 60s, hold AV blocking nodal agents -Obtain 2D echo  HTN -Blood pressure normal for most part  did have 1 episode of [id number] systolic, will monitor  Coronary artery disease status post stenting -No chest pain, troponin flat, slightly elevated probably representing demand ischemia  Chronic kidney disease stage IIIa -Creatinine appears at baseline  Scheduled Meds: . apixaban  5 mg Oral BID  . arformoterol  15 mcg Nebulization BID  . aspirin EC  81 mg Oral Q supper  . budesonide  0.25 mg Nebulization BID  . busPIRone  10 mg Oral BID  . gabapentin  200 mg Oral TID  . isosorbide mononitrate  120 mg Oral Daily  . lubiprostone  8 mcg Oral Q12H  . Revefenacin  3 mL Inhalation Daily  . sodium chloride flush  3 mL Intravenous Once   Continuous Infusions: PRN Meds:.acetaminophen **OR** acetaminophen, albuterol, amitriptyline, dicyclomine, famotidine, hydrALAZINE, nitroGLYCERIN, ondansetron **OR** ondansetron (ZOFRAN) IV  DVT prophylaxis: Eliquis Code Status: Full code Family Communication: updated son over the phone Disposition Plan: SNF   Consultants:  None   Procedures:  2D echo: pending  Microbiology  none  Antimicrobials: none    Objective: Vitals:   11/28/19 0631 11/28/19 0645 11/28/19 0650 11/28/19 1550  BP: 117/80 124/86 123/70 (!) 169/78  Pulse: (!) 140 (!) 141 (!) 105 64  Resp:    (!) 28  Temp:    98.2 F (36.8 C)  TempSrc:    Oral  SpO2:    95%  Weight:      Height:        Intake/Output Summary (Last 24 hours) at 11/28/2019 1632 Last data filed at 11/28/2019 1400 Gross  per 24 hour  Intake 2861.55 ml  Output 1075 ml  Net 1786.55 ml   Filed Weights   11/27/19 0335 11/28/19 0500  Weight: 96.5 kg 93.9 kg    Examination:  Constitutional: No apparent distress Eyes: no scleral icterus ENMT: Mucous membranes are moist.  Neck: normal, supple Respiratory: clear to auscultation bilaterally, no wheezing, no crackles. Normal respiratory effort.  Cardiovascular: Regular rate and rhythm, no murmurs / rubs / gallops. No LE edema.  Abdomen: non distended, no  tenderness. Bowel sounds positive.  Musculoskeletal: no clubbing / cyanosis.  Skin: no rashes Neurologic: CN 2-12 grossly intact. Strength 5/5 in all 4.  Tremor present  Data Reviewed: I have independently reviewed following labs and imaging studies   CBC: Recent Labs  Lab 11/26/19 1846 11/27/19 0516 11/28/19 0512  WBC 7.9 8.9 7.9  HGB 14.5 13.9 13.6  HCT 47.7* 45.9 44.3  MCV 99.8 100.4* 100.0  PLT 256 230 [id number]   Basic Metabolic Panel: Recent Labs  Lab 11/26/19 1846 11/27/19 0516 11/28/19 0512  NA 144 144 144  K 3.6 3.4* 3.3*  CL 96* 98 101  CO2 34* 34* 31  GLUCOSE 131* 150* 131*  BUN 21 21 22   CREATININE 1.19* 1.07* 1.06*  CALCIUM 9.6 9.0 9.0   Liver Function Tests: Recent Labs  Lab 11/26/19 1846 11/28/19 0512  AST 20 19  ALT 26 19  ALKPHOS 74 57  BILITOT 0.7 0.5  PROT 7.3 6.1*  ALBUMIN 4.0 3.4*   Coagulation Profile: No results for input(s): INR, PROTIME in the last 168 hours. HbA1C: No results for input(s): HGBA1C in the last 72 hours. CBG: No results for input(s): GLUCAP in the last 168 hours.  Recent Results (from the past 240 hour(s))  Respiratory Panel by RT PCR (Flu A&B, Covid) - Nasopharyngeal Swab     Status: None   Collection Time: 11/26/19  9:38 PM   Specimen: Nasopharyngeal Swab  Result Value Ref Range Status   SARS Coronavirus 2 by RT PCR NEGATIVE NEGATIVE Final    Comment: (NOTE) SARS-CoV-2 target nucleic acids are NOT DETECTED. The SARS-CoV-2 RNA is generally detectable in upper respiratoy specimens during the acute phase of infection. The lowest concentration of SARS-CoV-2 viral copies this assay can detect is 131 copies/mL. A negative result does not preclude SARS-Cov-2 infection and should not be used as the sole basis for treatment or other patient management decisions. A negative result may occur with  improper specimen collection/handling, submission of specimen other than nasopharyngeal swab, presence of viral mutation(s)  within the areas targeted by this assay, and inadequate number of viral copies (<131 copies/mL). A negative result must be combined with clinical observations, patient history, and epidemiological information. The expected result is Negative. Fact Sheet for Patients:  PinkCheek.be Fact Sheet for Healthcare Providers:  GravelBags.it This test is not yet ap proved or cleared by the [place] FDA and  has been authorized for detection and/or diagnosis of SARS-CoV-2 by FDA under an Emergency Use Authorization (EUA). This EUA will remain  in effect (meaning this test can be used) for the duration of the COVID-19 declaration under Section 564(b)(1) of the Act, 21 U.S.C. section 360bbb-3(b)(1), unless the authorization is terminated or revoked sooner.    Influenza A by PCR NEGATIVE NEGATIVE Final   Influenza B by PCR NEGATIVE NEGATIVE Final    Comment: (NOTE) The Xpert Xpress SARS-CoV-2/FLU/RSV assay is intended as an aid in  the diagnosis of influenza from Nasopharyngeal swab specimens and  should not be used as a sole basis for treatment. Nasal washings and  aspirates are unacceptable for Xpert Xpress SARS-CoV-2/FLU/RSV  testing. Fact Sheet for Patients: PinkCheek.be Fact Sheet for Healthcare Providers: GravelBags.it This test is not yet approved or cleared by the [place] FDA and  has been authorized for detection and/or diagnosis of SARS-CoV-2 by  FDA under an Emergency Use Authorization (EUA). This EUA will remain  in effect (meaning this test can be used) for the duration of the  Covid-19 declaration under Section 564(b)(1) of the Act, 21  U.S.C. section 360bbb-3(b)(1), unless the authorization is  terminated or revoked. Performed at [facility], [place] [address] [place], [place] [address]      Radiology Studies: No results  found.  Marzetta Board, MD, PhD [facility]  Between 7 am - 7 pm I am available, please contact me via Amion or Securechat  Between 7 pm - 7 am I am not available, please contact night coverage MD/APP via Amion

## 2019-11-28 NOTE — Progress Notes (Signed)
Patient converted to A-fib RVR rate 140s sustained around 6:30am shortly after receiving 10mg  hydralazine IV for hypertension.  Patient with stable bp however experiencing chest pain 7/10.  Provider notified.  12-lead confirms rhythm, 5mg  metoprolol pushed IV.  Blood pressure remains stable.  Chest pain resolved.  Still in A-fib but rate 100-110.

## 2019-11-29 ENCOUNTER — Inpatient Hospital Stay (HOSPITAL_COMMUNITY): Payer: Medicare Other

## 2019-11-29 DIAGNOSIS — I4891 Unspecified atrial fibrillation: Secondary | ICD-10-CM

## 2019-11-29 LAB — ECHOCARDIOGRAM COMPLETE
Height: 64 in
Weight: 3506.2 oz

## 2019-11-29 LAB — BASIC METABOLIC PANEL
Anion gap: 12 (ref 5–15)
BUN: 23 mg/dL (ref 8–23)
CO2: 32 mmol/L (ref 22–32)
Calcium: 8.7 mg/dL — ABNORMAL LOW (ref 8.9–10.3)
Chloride: 100 mmol/L (ref 98–111)
Creatinine, Ser: 1.28 mg/dL — ABNORMAL HIGH (ref 0.44–1.00)
GFR calc Af Amer: 45 mL/min — ABNORMAL LOW (ref >60)
GFR calc non Af Amer: 39 mL/min — ABNORMAL LOW (ref >60)
Glucose, Bld: 139 mg/dL — ABNORMAL HIGH (ref 70–99)
Potassium: 3.7 mmol/L (ref 3.5–5.1)
Sodium: 144 mmol/L (ref 135–145)

## 2019-11-29 MED ORDER — METOPROLOL TARTRATE 5 MG/5ML IV SOLN
5.0000 mg | Freq: Once | INTRAVENOUS | Status: AC
  Administered 2019-11-29: 16:00:00 5 mg via INTRAVENOUS
  Filled 2019-11-29: qty 5

## 2019-11-29 MED ORDER — METOPROLOL TARTRATE 25 MG PO TABS
12.5000 mg | ORAL_TABLET | Freq: Two times a day (BID) | ORAL | Status: DC
  Administered 2019-11-29 – 2019-12-03 (×8): 12.5 mg via ORAL
  Filled 2019-11-29 (×9): qty 1

## 2019-11-29 NOTE — Progress Notes (Signed)
  Echocardiogram 2D Echocardiogram has been performed.  [name] 11/29/2019, 1:51 PM

## 2019-11-29 NOTE — Progress Notes (Signed)
Spoke with Cletus Gash, son, on the phone with updates, plan of care and discharge.

## 2019-11-29 NOTE — TOC Initial Note (Signed)
Transition of Care [facility]) - Initial/Assessment Note    Patient Details  Name: [name] MRN: [id number] Date of Birth: 08/18/1935  Transition of Care [facility]) CM/SW Contact:    Dessa Phi, RN Phone Number: 11/29/2019, 11:55 AM  Clinical Narrative:PT recc SNF. Await OT to eval. Patient/son in agreement to SNF. Will do fl2 once OT note. Last covid 1/8-will need new covid.                    Expected Discharge Plan: Skilled Nursing Facility Barriers to Discharge: Continued Medical Work up   Patient Goals and CMS Choice     Choice offered to / list presented to : Adult Children  Expected Discharge Plan and Services Expected Discharge Plan: [place]   Discharge Planning Services: CM Consult   Living arrangements for the past 2 months: Single Family Home                                      Prior Living Arrangements/Services Living arrangements for the past 2 months: Single Family Home Lives with:: Siblings Patient language and need for interpreter reviewed:: Yes Do you feel safe going back to the place where you live?: Yes      Need for Family Participation in Patient Care: No (Comment) Care giver support system in place?: Yes (comment)   Criminal Activity/Legal Involvement Pertinent to Current Situation/Hospitalization: No - Comment as needed  Activities of Daily Living Home Assistive Devices/Equipment: Oxygen, Walker (specify type) ADL Screening (condition at time of admission) Patient's cognitive ability adequate to safely complete daily activities?: Yes Is the patient deaf or have difficulty hearing?: Yes Does the patient have difficulty seeing, even when wearing glasses/contacts?: No Does the patient have difficulty concentrating, remembering, or making decisions?: No Patient able to express need for assistance with ADLs?: Yes Does the patient have difficulty dressing or bathing?: Yes Independently performs ADLs?: No Communication:  Independent Dressing (OT): Needs assistance Is this a change from baseline?: Pre-admission baseline Grooming: Needs assistance Is this a change from baseline?: Pre-admission baseline Feeding: Independent Bathing: Needs assistance Is this a change from baseline?: Pre-admission baseline Toileting: Needs assistance Is this a change from baseline?: Pre-admission baseline In/Out Bed: Needs assistance Is this a change from baseline?: Change from baseline, expected to last <3 days Walks in Home: Independent with device (comment)(walker at basline) Does the patient have difficulty walking or climbing stairs?: Yes Weakness of Legs: None Weakness of Arms/Hands: Both  Permission Sought/Granted Permission sought to share information with : Case Manager Permission granted to share information with : Yes, Verbal Permission Granted           Permission granted to share info w Contact Information: Cletus Gash [phone]  Emotional Assessment Appearance:: Appears stated age Attitude/Demeanor/Rapport: Gracious Affect (typically observed): Accepting Orientation: : Oriented to Self, Oriented to Place Alcohol / Substance Use: Not Applicable Psych Involvement: No (comment)  Admission diagnosis:  Myalgia [M79.10] Weakness generalized [id number] Acute metabolic encephalopathy [id number] Patient Active Problem List   Diagnosis Date Noted  . Acute metabolic encephalopathy 123XX123  . Weakness generalized 11/26/2019  . Coronary artery disease involving native coronary artery of native heart with angina pectoris ([place]) 04/06/2019  . Dyslipidemia 04/06/2019  . Shortness of breath 04/06/2019  . Educated about COVID-19 virus infection 04/06/2019  . Acute on chronic diastolic heart failure ([place])   . NSTEMI (non-ST elevated myocardial  infarction) ([place]) 08/28/2018  . Acute kidney injury ([place]) 08/28/2018  . Acute exacerbation of chronic obstructive pulmonary disease (COPD) ([place]) 08/28/2018  . GERD  (gastroesophageal reflux disease) 04/14/2018  . Back pain 04/14/2018  . Right flank pain 04/14/2018  . COPD, group D, by GOLD 2017 classification ([place]) 10/06/2017  . Depression 10/05/2017  . Rectal pain 10/05/2017  . AP (abdominal pain)   . Atypical chest pain 11/28/2014  . Chest pain 11/28/2014  . Tobacco abuse 11/28/2014  . Skin cancer of nose   . Squamous cell carcinoma of skin of other and unspecified parts of face 10/15/2013  . COPD exacerbation ([place]) 01/30/2013  . Abdominal pain 01/30/2013  . Constipation 01/30/2013  . Hypokalemia 01/30/2013  . Leukocytosis 05/14/2012  . HTN (hypertension) 05/14/2012  . Progressive angina ([place]) 05/07/2012  . Hyperlipemia   . Dysphagia, unspecified(787.20) 02/04/2012  . Esophageal reflux 02/04/2012  . Coronary atherosclerosis 08/28/2010  . ANAL OR RECTAL PAIN 08/28/2010  . NAUSEA ALONE 08/28/2010   PCP:  Leonard Downing, MD Pharmacy:   [facility] [place] - [id number] [place] ([place]) [place] [address] Phone: [phone] Fax: [phone]  [place], [place] [address] Phone: [phone] Fax: [phone]  [place], [place] [place] [place] [address] Phone: [phone] Fax: [phone]     Social Determinants of Health (SDOH) Interventions    Readmission Risk Interventions No flowsheet data found.

## 2019-11-29 NOTE — Progress Notes (Signed)
Pt HR 130s Afib. BP 154/56. Asymptomatic. MD notified. Will continue to monitor.

## 2019-11-29 NOTE — Progress Notes (Signed)
Pt very confused. Pt able to tell nurse the year and the month. Knows her name and DOB, however, pt thinks all the nurses are named Lelan Pons, which is her younger sister. Pt refused HS meds. Phone call made to son [name],

## 2019-11-29 NOTE — Progress Notes (Signed)
Pt assisted oob to bsc, heart rate up to 140's, back in bed, HR 100/101, o2 remains on patient.

## 2019-11-29 NOTE — Progress Notes (Signed)
PROGRESS NOTE  [name] [id number] DOB: [birth year] DOA: 11/26/2019 PCP: Leonard Downing, MD   LOS: 2 days   Brief Narrative / Interim history: 85 year old female with CAD status post stents, COPD, CKD 3, HTN who was brought to the ER after she was found laying on her bed with urine and feces around her, unable to get out of bed for the past 2 days.  She was complaining of pain all over her body.  On my evaluation after admission she appears confused.  She is alert but does not really answer any of my questions or her answers are quite tangential.  Subjective / 24h Interval events: Feeling a little bit better this morning.  Pain is better.  Assessment & Plan: Principal Problem Acute metabolic encephalopathy/generalized weakness -MRI of the brain was negative for acute stroke however did show some old lacunar strokes -Infectious work-up negative, urinalysis, chest x-ray all unremarkable.  She is being monitored off antibiotics and remains afebrile and without leukocytosis -TSH was normal at 0.9 -Son mentioned to me that patient has been having episodes like this in the past, the usually last 1 day and then she returns to normal -With all the work-up negative possibly medication induced, family is helping and assisting with her medication except for the gabapentin which patient keeps by bedside and apparently has 2 bottles next to where she sleeps.  She was placed here on low-dose scheduled gabapentin and seems to be improving -Remains alert and oriented, she returned to baseline  Active Problems Paroxysmal A. Fib, new -Patient reported intermittent fluttering and hearing are heartbeat even at home on several occasions in the past.  This morning she developed A. fib with RVR and converted right back to sinus rhythm -Start Eliquis,CHADSVASC score is 5 -Currently in sinus, heart rate in the 60s, hold AV blocking nodal agents -Obtain 2D echo, pending -Flips back and forth between  sinus and A. fib with RVR.  We will add low-dose metoprolol today  HTN -Start metoprolol today  Coronary artery disease status post stenting -No chest pain, troponin flat, slightly elevated probably representing demand ischemia  Chronic kidney disease stage IIIa -Creatinine appears at baseline  Scheduled Meds: . apixaban  5 mg Oral BID  . arformoterol  15 mcg Nebulization BID  . aspirin EC  81 mg Oral Q supper  . budesonide  0.25 mg Nebulization BID  . busPIRone  10 mg Oral BID  . gabapentin  200 mg Oral TID  . isosorbide mononitrate  120 mg Oral Daily  . lubiprostone  8 mcg Oral Q12H  . metoprolol tartrate  12.5 mg Oral BID  . Revefenacin  3 mL Inhalation Daily  . sodium chloride flush  3 mL Intravenous Once   Continuous Infusions: PRN Meds:.acetaminophen **OR** acetaminophen, albuterol, amitriptyline, dicyclomine, famotidine, hydrALAZINE, nitroGLYCERIN, ondansetron **OR** ondansetron (ZOFRAN) IV  DVT prophylaxis: Eliquis Code Status: Full code Family Communication: updated son over the phone on 1/10 Disposition Plan: SNF   Consultants:  None   Procedures:  2D echo: pending  Microbiology  none  Antimicrobials: none    Objective: Vitals:   11/28/19 2020 11/29/19 0500 11/29/19 0622 11/29/19 0914  BP: (!) 146/65  (!) 176/73   Pulse: 68  68   Resp: (!) 24  (!) 22   Temp: 98.2 F (36.8 C)  97.9 F (36.6 C)   TempSrc: Oral  Oral   SpO2: 95%  97% 95%  Weight:  99.4 kg    Height:  Intake/Output Summary (Last 24 hours) at 11/29/2019 1142 Last data filed at 11/28/2019 1400 Gross per 24 hour  Intake 740 ml  Output 700 ml  Net 40 ml   Filed Weights   11/27/19 0335 11/28/19 0500 11/29/19 0500  Weight: 96.5 kg 93.9 kg 99.4 kg    Examination:  Constitutional: No distress, sitting in bed Eyes: No scleral icterus ENMT: Moist mucous membranes Neck: normal, supple Respiratory: Clear to auscultation bilaterally without wheezing or crackles, normal  respiratory effort Cardiovascular: Regular rate and rhythm, no murmurs appreciated.  No peripheral edema Abdomen: Soft, nontender, nondistended, bowel sounds positive Musculoskeletal: no clubbing / cyanosis.  Skin: No new rashes Neurologic: No focal deficits, weak but strength is equal  Data Reviewed: I have independently reviewed following labs and imaging studies   CBC: Recent Labs  Lab 11/26/19 1846 11/27/19 0516 11/28/19 0512  WBC 7.9 8.9 7.9  HGB 14.5 13.9 13.6  HCT 47.7* 45.9 44.3  MCV 99.8 100.4* 100.0  PLT 256 230 [id number]   Basic Metabolic Panel: Recent Labs  Lab 11/26/19 1846 11/27/19 0516 11/28/19 0512 11/29/19 0454  NA 144 144 144 144  K 3.6 3.4* 3.3* 3.7  CL 96* 98 101 100  CO2 34* 34* 31 32  GLUCOSE 131* 150* 131* 139*  BUN 21 21 22 23   CREATININE 1.19* 1.07* 1.06* 1.28*  CALCIUM 9.6 9.0 9.0 8.7*   Liver Function Tests: Recent Labs  Lab 11/26/19 1846 11/28/19 0512  AST 20 19  ALT 26 19  ALKPHOS 74 57  BILITOT 0.7 0.5  PROT 7.3 6.1*  ALBUMIN 4.0 3.4*   Coagulation Profile: No results for input(s): INR, PROTIME in the last 168 hours. HbA1C: No results for input(s): HGBA1C in the last 72 hours. CBG: No results for input(s): GLUCAP in the last 168 hours.  Recent Results (from the past 240 hour(s))  Respiratory Panel by RT PCR (Flu A&B, Covid) - Nasopharyngeal Swab     Status: None   Collection Time: 11/26/19  9:38 PM   Specimen: Nasopharyngeal Swab  Result Value Ref Range Status   SARS Coronavirus 2 by RT PCR NEGATIVE NEGATIVE Final    Comment: (NOTE) SARS-CoV-2 target nucleic acids are NOT DETECTED. The SARS-CoV-2 RNA is generally detectable in upper respiratoy specimens during the acute phase of infection. The lowest concentration of SARS-CoV-2 viral copies this assay can detect is 131 copies/mL. A negative result does not preclude SARS-Cov-2 infection and should not be used as the sole basis for treatment or other patient management  decisions. A negative result may occur with  improper specimen collection/handling, submission of specimen other than nasopharyngeal swab, presence of viral mutation(s) within the areas targeted by this assay, and inadequate number of viral copies (<131 copies/mL). A negative result must be combined with clinical observations, patient history, and epidemiological information. The expected result is Negative. Fact Sheet for Patients:  PinkCheek.be Fact Sheet for Healthcare Providers:  GravelBags.it This test is not yet ap proved or cleared by the [place] FDA and  has been authorized for detection and/or diagnosis of SARS-CoV-2 by FDA under an Emergency Use Authorization (EUA). This EUA will remain  in effect (meaning this test can be used) for the duration of the COVID-19 declaration under Section 564(b)(1) of the Act, 21 U.S.C. section 360bbb-3(b)(1), unless the authorization is terminated or revoked sooner.    Influenza A by PCR NEGATIVE NEGATIVE Final   Influenza B by PCR NEGATIVE NEGATIVE Final    Comment: (NOTE) The Xpert  Xpress SARS-CoV-2/FLU/RSV assay is intended as an aid in  the diagnosis of influenza from Nasopharyngeal swab specimens and  should not be used as a sole basis for treatment. Nasal washings and  aspirates are unacceptable for Xpert Xpress SARS-CoV-2/FLU/RSV  testing. Fact Sheet for Patients: PinkCheek.be Fact Sheet for Healthcare Providers: GravelBags.it This test is not yet approved or cleared by the [place] FDA and  has been authorized for detection and/or diagnosis of SARS-CoV-2 by  FDA under an Emergency Use Authorization (EUA). This EUA will remain  in effect (meaning this test can be used) for the duration of the  Covid-19 declaration under Section 564(b)(1) of the Act, 21  U.S.C. section 360bbb-3(b)(1), unless the authorization  is  terminated or revoked. Performed at [facility], [place] [address] [place], [place] [address]      Radiology Studies: No results found.  Marzetta Board, MD, PhD [facility]  Between 7 am - 7 pm I am available, please contact me via Amion or Securechat  Between 7 pm - 7 am I am not available, please contact night coverage MD/APP via Amion

## 2019-11-29 NOTE — Discharge Instructions (Signed)

## 2019-11-30 LAB — SARS CORONAVIRUS 2 (TAT 6-24 HRS): SARS Coronavirus 2: NEGATIVE

## 2019-11-30 MED ORDER — HYDRALAZINE HCL 25 MG PO TABS: 25.0000 mg | ORAL_TABLET | Freq: Three times a day (TID) | ORAL | Status: DC

## 2019-11-30 MED ORDER — AMLODIPINE BESYLATE 10 MG PO TABS
10.0000 mg | ORAL_TABLET | Freq: Every day | ORAL | Status: DC
  Administered 2019-11-30 – 2019-12-03 (×4): 10 mg via ORAL
  Filled 2019-11-30 (×4): qty 1

## 2019-11-30 MED ORDER — PREDNISONE 20 MG PO TABS
40.0000 mg | ORAL_TABLET | Freq: Every day | ORAL | Status: DC
  Administered 2019-11-30 – 2019-12-03 (×4): 40 mg via ORAL
  Filled 2019-11-30 (×4): qty 2

## 2019-11-30 MED ORDER — AMLODIPINE BESYLATE 10 MG PO TABS: 10.0000 mg | ORAL_TABLET | Freq: Every day | ORAL | Status: DC

## 2019-11-30 MED ORDER — GABAPENTIN 100 MG PO CAPS
100.0000 mg | ORAL_CAPSULE | Freq: Three times a day (TID) | ORAL | Status: DC
  Administered 2019-11-30 – 2019-12-03 (×10): 100 mg via ORAL
  Filled 2019-11-30 (×10): qty 1

## 2019-11-30 NOTE — Evaluation (Signed)
Occupational Therapy Evaluation Patient Details Name: [name] MRN: [id number] DOB: [birth year] Today's Date: 11/30/2019    History of Present Illness 85 yo female admitted to ED on 1/8 after family found pt covered in urine and too weak to ambulate at home. MRI negative for acute abnormality. PMH includes OA, CAD, COPD, HOH, HTN, CAD with history of NSTEMI and stenting, depression.   Clinical Impression   Pt with decline in function and safety with ADLs and ADL mobility with impaired strength, balance, endurance and cognition. Pt live sat home with her sister and pt receives assist with ADLs at baseline, but unable to give accurate info on how much assist, pt uses a RW. Pt currently requires min guard A with UB self care/grooming, max - total A with LB self care, max A with toileting and max - mod A with mobility using RW. Pt would benefit from acute OT services to address impairments to maximize level of function and safety    Follow Up Recommendations  SNF;Supervision/Assistance - 24 hour    Equipment Recommendations  Other (comment)(TBD at next venue of care)    Recommendations for Other Services       Precautions / Restrictions Precautions Precautions: Fall Precaution Comments: on 2LO2 chronically Restrictions Weight Bearing Restrictions: No      Mobility Bed Mobility Overal bed mobility: Needs Assistance Bed Mobility: Supine to Sit     Supine to sit: Mod assist;HOB elevated     General bed mobility comments: pt in recliner upon arrival  Transfers Overall transfer level: Needs assistance Equipment used: Rolling walker (2 wheeled) Transfers: Sit to/from [facility] Sit to Stand: Max assist Stand pivot transfers: Mod assist       General transfer comment: attempted with one person however pt started pulling on therapist's arm so requested second person and pt provided with multiple cues to hold/push down on RW, assist for steadying with pivot  to recliner    Balance Overall balance assessment: Needs assistance;History of Falls Sitting-balance support: Bilateral upper extremity supported;Feet supported Sitting balance-Leahy Scale: Fair     Standing balance support: Bilateral upper extremity supported;During functional activity Standing balance-Leahy Scale: Poor                             ADL either performed or assessed with clinical judgement   ADL Overall ADL's : Needs assistance/impaired Eating/Feeding: Set up;Independent;Sitting   Grooming: Dance movement psychotherapist;Wash/dry hands;Min guard   Upper Body Bathing: Min guard;Sitting   Lower Body Bathing: Maximal assistance   Upper Body Dressing : Min guard;Sitting   Lower Body Dressing: Total assistance   Toilet Transfer: Maximal assistance;Moderate assistance;Stand-pivot;BSC;Cueing for safety;Cueing for sequencing;RW;Ambulation   Toileting- Clothing Manipulation and Hygiene: Maximal assistance;Sit to/from stand       Functional mobility during ADLs: Moderate assistance;Minimal assistance;Cueing for safety;Cueing for sequencing;Rolling walker General ADL Comments: pt receives assist with ADLs at baseline, but unable to give accurate info on how much assist     Vision Patient Visual Report: No change from baseline       Perception     Praxis      Pertinent Vitals/Pain Pain Assessment: Faces Faces Pain Scale: Hurts little more Pain Location: back Pain Descriptors / Indicators: Sore;Discomfort;Grimacing Pain Intervention(s): Monitored during session;Repositioned     Hand Dominance Right   Extremity/Trunk Assessment Upper Extremity Assessment Upper Extremity Assessment: Generalized weakness   Lower Extremity Assessment Lower Extremity Assessment: Defer to PT evaluation  Cervical / Trunk Assessment Cervical / Trunk Assessment: Kyphotic   Communication Communication Communication: HOH   Cognition Arousal/Alertness: Awake/alert Behavior  During Therapy: WFL for tasks assessed/performed Overall Cognitive Status: Impaired/Different from baseline Area of Impairment: Orientation;Following commands;Safety/judgement;Problem solving                 Orientation Level: Disoriented to;Situation;Time;Place     Following Commands: Follows one step commands consistently Safety/Judgement: Decreased awareness of safety;Decreased awareness of deficits   Problem Solving: Difficulty sequencing;Requires verbal cues;Requires tactile cues General Comments: pt appears confused at times, very [facility]   General Comments       Exercises     Shoulder Instructions      Home Living Family/patient expects to be discharged to:: Private residence Living Arrangements: Alone Available Help at Discharge: Family;Available PRN/intermittently Type of Home: Mobile home Home Access: Stairs to enter Entrance Stairs-Number of Steps: 5 Entrance Stairs-Rails: Left;Right;Can reach both Home Layout: One level         Bathroom Toilet: Standard Bathroom Accessibility: Yes   Home Equipment: Walker - 4 wheels;Bedside commode;Walker - 2 wheels;Shower seat;Hand held shower head;Adaptive equipment Adaptive Equipment: Reacher;Sock aid Additional Comments: family report is pt lives alone, per pt she lives with her younger sister      Prior Functioning/Environment Level of Independence: Needs assistance  Gait / Transfers Assistance Needed: pt reports using RW for ambulation PTA ADL's / Homemaking Assistance Needed: pt's sister helps with dressing and bathing, pt states her son brings food for them. Pt's sister lives with her   Comments: on 2LO2 chronically        OT Problem List: Decreased strength;Impaired balance (sitting and/or standing);Decreased cognition;Pain;Decreased safety awareness;Decreased activity tolerance;Decreased knowledge of use of DME or AE      OT Treatment/Interventions: Self-care/ADL training;DME and/or AE instruction;Balance  training;Therapeutic activities;Therapeutic exercise;Patient/family education    OT Goals(Current goals can be found in the care plan section) Acute Rehab OT Goals Patient Stated Goal: go home OT Goal Formulation: With patient Time For Goal Achievement: 12/14/19 Potential to Achieve Goals: Good ADL Goals Pt Will Perform Grooming: with supervision;with set-up;sitting Pt Will Perform Upper Body Bathing: with supervision;with set-up;sitting Pt Will Perform Lower Body Bathing: with mod assist;sitting/lateral leans;sit to/from stand Pt Will Perform Upper Body Dressing: with supervision;with set-up;sitting Pt Will Transfer to Toilet: with mod assist;with min assist;ambulating;stand pivot transfer;bedside commode  OT Frequency: Min 2X/week   Barriers to D/C: Decreased caregiver support          Co-evaluation              AM-PAC OT "6 Clicks" Daily Activity     Outcome Measure Help from another person eating meals?: None Help from another person taking care of personal grooming?: A Little Help from another person toileting, which includes using toliet, bedpan, or urinal?: Total Help from another person bathing (including washing, rinsing, drying)?: A Lot Help from another person to put on and taking off regular upper body clothing?: A Little Help from another person to put on and taking off regular lower body clothing?: Total 6 Click Score: 14   End of Session Equipment Utilized During Treatment: Gait belt;Rolling walker;Other (comment)(BSC)  Activity Tolerance: Patient limited by fatigue Patient left: in chair;with call bell/phone within reach;with chair alarm set  OT Visit Diagnosis: Unsteadiness on feet (R26.81);Other abnormalities of gait and mobility (R26.89);Muscle weakness (generalized) (M62.81);History of falling (Z91.81);Other symptoms and signs involving cognitive function;Pain Pain - part of body: (back)  Time: [id number] OT Time Calculation (min): 26  min Charges:  OT General Charges $OT Visit: 1 Visit OT Evaluation $OT Eval Low Complexity: 1 Low OT Treatments $Self Care/Home Management : 8-22 mins    Britt Bottom 11/30/2019, 1:00 PM

## 2019-11-30 NOTE — Progress Notes (Signed)
PROGRESS NOTE  [name] [id number] DOB: Aug 18, 1935 DOA: 11/26/2019 PCP: Leonard Downing, MD   LOS: 3 days   Brief Narrative / Interim history: 85 year old female with CAD status post stents, COPD, CKD 3, HTN who was brought to the ER after she was found laying on her bed with urine and feces around her, unable to get out of bed for the past 2 days.  She was complaining of pain all over her body.  On my evaluation after admission she appears confused.  She is alert but does not really answer any of my questions or her answers are quite tangential. Her mental status has gradually improved.   Subjective / 24h Interval events: Complains of more wheezing today.   Assessment & Plan: Principal Problem Acute metabolic encephalopathy/generalized weakness -MRI of the brain was negative for acute stroke however did show some old lacunar strokes -Infectious work-up negative, urinalysis, chest x-ray all unremarkable.  She is being monitored off antibiotics and remains afebrile and without leukocytosis -TSH was normal at 0.9 -Son mentioned to me that patient has been having episodes like this in the past, the usually last 1 day and then she returns to normal -With all the work-up negative possibly medication induced, family is helping and assisting with her medication except for the gabapentin which patient keeps by bedside and apparently has 2 bottles next to where she sleeps.  She was placed here on low-dose scheduled gabapentin and seems to be improving and stable. I am also suspicious of perhaps mild dementia setting in -remains alert and oriented  Active Problems Paroxysmal A. Fib, new -Patient reported intermittent fluttering and hearing are heartbeat even at home on several occasions in the past. During her hospital stay she flipped back and forth between A fib with RVR and sinus rhythm. Started on BB with good response.  -Start Eliquis,CHADSVASC score is 5 -Obtain 2D echo, EF  55-60%, rest as below -Flips back and forth between sinus and A. fib with RVR.  Started on metoprolol  COPD with chronic hypoxic respiratory failure -follows with pulmonary as an outpatient. Has history of 30 pack year smoking, quit 2016  -increased wheezing today, more short of breath with moving to the chair. -start prednisone, continue to monitor. Hold discharge until ensuring respiratory status better. Continue albuterol, pulmicort nebs  HTN -continue amlodipine, metoprolol  Coronary artery disease status post stenting -No chest pain, troponin flat, slightly elevated probably representing demand ischemia  Chronic kidney disease stage IIIa -Creatinine appears at baseline  Scheduled Meds: . amLODipine  10 mg Oral Daily  . apixaban  5 mg Oral BID  . arformoterol  15 mcg Nebulization BID  . aspirin EC  81 mg Oral Q supper  . budesonide  0.25 mg Nebulization BID  . busPIRone  10 mg Oral BID  . gabapentin  100 mg Oral TID  . isosorbide mononitrate  120 mg Oral Daily  . lubiprostone  8 mcg Oral Q12H  . metoprolol tartrate  12.5 mg Oral BID  . Revefenacin  3 mL Inhalation Daily  . sodium chloride flush  3 mL Intravenous Once   Continuous Infusions: PRN Meds:.acetaminophen **OR** acetaminophen, albuterol, amitriptyline, dicyclomine, famotidine, hydrALAZINE, nitroGLYCERIN, ondansetron **OR** ondansetron (ZOFRAN) IV  DVT prophylaxis: Eliquis Code Status: Full code Family Communication: updated son over the phone on 1/10 Disposition Plan: SNF   Consultants:  None   Procedures:  2D echo:  IMPRESSIONS   1. Left ventricular ejection fraction, by visual estimation, is 55 to  60%. The left ventricle has normal function. There is no left ventricular hypertrophy.  2. Left ventricular diastolic parameters are consistent with Grade I diastolic dysfunction (impaired relaxation).  3. The left ventricle has no regional wall motion abnormalities.  4. Global right ventricle has normal  systolic function.The right ventricular size is normal. No increase in right ventricular wall thickness.  5. Left atrial size was normal.  6. Right atrial size was normal.  7. The mitral valve is normal in structure. No evidence of mitral valve regurgitation. No evidence of mitral stenosis.  8. The tricuspid valve is normal in structure.  9. The aortic valve is normal in structure. Aortic valve regurgitation is not visualized. No evidence of aortic valve sclerosis or stenosis. 10. The pulmonic valve was normal in structure. Pulmonic valve regurgitation is not visualized. 11. The inferior vena cava is normal in size with greater than 50% respiratory variability, suggesting right atrial pressure of 3 mmHg.  Microbiology  none  Antimicrobials: none    Objective: Vitals:   11/29/19 2041 11/30/19 0635 11/30/19 0740 11/30/19 1357  BP:  (!) 164/70  (!) 143/69  Pulse:  (!) 53  (!) 56  Resp:  (!) 24  (!) 24  Temp:  97.7 F (36.5 C)  98.2 F (36.8 C)  TempSrc:  Oral  Oral  SpO2: 99% 96% 96% 95%  Weight:      Height:        Intake/Output Summary (Last 24 hours) at 11/30/2019 1402 Last data filed at 11/30/2019 1218 Gross per 24 hour  Intake --  Output 1700 ml  Net -1700 ml   Filed Weights   11/27/19 0335 11/28/19 0500 11/29/19 0500  Weight: 96.5 kg 93.9 kg 99.4 kg    Examination:  Constitutional: NAD, in bed, respiratory therapy at bedside for scheduled nebulizer Eyes: no icterus  ENMT: mmm Neck: normal, supple Respiratory: bilateral wheezing present, moves air well Cardiovascular: RRR, no edema Abdomen: soft, NT, ND, BS+ Musculoskeletal: no clubbing / cyanosis.  Skin: no rashes Neurologic: non focal, equal strength   Data Reviewed: I have independently reviewed following labs and imaging studies   CBC: Recent Labs  Lab 11/26/19 1846 11/27/19 0516 11/28/19 0512  WBC 7.9 8.9 7.9  HGB 14.5 13.9 13.6  HCT 47.7* 45.9 44.3  MCV 99.8 100.4* 100.0  PLT 256 230 [id number]    Basic Metabolic Panel: Recent Labs  Lab 11/26/19 1846 11/27/19 0516 11/28/19 0512 11/29/19 0454  NA 144 144 144 144  K 3.6 3.4* 3.3* 3.7  CL 96* 98 101 100  CO2 34* 34* 31 32  GLUCOSE 131* 150* 131* 139*  BUN 21 21 22 23   CREATININE 1.19* 1.07* 1.06* 1.28*  CALCIUM 9.6 9.0 9.0 8.7*   Liver Function Tests: Recent Labs  Lab 11/26/19 1846 11/28/19 0512  AST 20 19  ALT 26 19  ALKPHOS 74 57  BILITOT 0.7 0.5  PROT 7.3 6.1*  ALBUMIN 4.0 3.4*   Coagulation Profile: No results for input(s): INR, PROTIME in the last 168 hours. HbA1C: No results for input(s): HGBA1C in the last 72 hours. CBG: No results for input(s): GLUCAP in the last 168 hours.  Recent Results (from the past 240 hour(s))  Respiratory Panel by RT PCR (Flu A&B, Covid) - Nasopharyngeal Swab     Status: None   Collection Time: 11/26/19  9:38 PM   Specimen: Nasopharyngeal Swab  Result Value Ref Range Status   SARS Coronavirus 2 by RT PCR NEGATIVE NEGATIVE Final  Comment: (NOTE) SARS-CoV-2 target nucleic acids are NOT DETECTED. The SARS-CoV-2 RNA is generally detectable in upper respiratoy specimens during the acute phase of infection. The lowest concentration of SARS-CoV-2 viral copies this assay can detect is 131 copies/mL. A negative result does not preclude SARS-Cov-2 infection and should not be used as the sole basis for treatment or other patient management decisions. A negative result may occur with  improper specimen collection/handling, submission of specimen other than nasopharyngeal swab, presence of viral mutation(s) within the areas targeted by this assay, and inadequate number of viral copies (<131 copies/mL). A negative result must be combined with clinical observations, patient history, and epidemiological information. The expected result is Negative. Fact Sheet for Patients:  PinkCheek.be Fact Sheet for Healthcare Providers:   GravelBags.it This test is not yet ap proved or cleared by the [place] FDA and  has been authorized for detection and/or diagnosis of SARS-CoV-2 by FDA under an Emergency Use Authorization (EUA). This EUA will remain  in effect (meaning this test can be used) for the duration of the COVID-19 declaration under Section 564(b)(1) of the Act, 21 U.S.C. section 360bbb-3(b)(1), unless the authorization is terminated or revoked sooner.    Influenza A by PCR NEGATIVE NEGATIVE Final   Influenza B by PCR NEGATIVE NEGATIVE Final    Comment: (NOTE) The Xpert Xpress SARS-CoV-2/FLU/RSV assay is intended as an aid in  the diagnosis of influenza from Nasopharyngeal swab specimens and  should not be used as a sole basis for treatment. Nasal washings and  aspirates are unacceptable for Xpert Xpress SARS-CoV-2/FLU/RSV  testing. Fact Sheet for Patients: PinkCheek.be Fact Sheet for Healthcare Providers: GravelBags.it This test is not yet approved or cleared by the [place] FDA and  has been authorized for detection and/or diagnosis of SARS-CoV-2 by  FDA under an Emergency Use Authorization (EUA). This EUA will remain  in effect (meaning this test can be used) for the duration of the  Covid-19 declaration under Section 564(b)(1) of the Act, 21  U.S.C. section 360bbb-3(b)(1), unless the authorization is  terminated or revoked. Performed at [facility], [place] [address], [place], [place] [address]   SARS CORONAVIRUS 2 (TAT 6-24 HRS) Nasopharyngeal Nasopharyngeal Swab     Status: None   Collection Time: 11/29/19  9:24 PM   Specimen: Nasopharyngeal Swab  Result Value Ref Range Status   SARS Coronavirus 2 NEGATIVE NEGATIVE Final    Comment: (NOTE) SARS-CoV-2 target nucleic acids are NOT DETECTED. The SARS-CoV-2 RNA is generally detectable in upper and lower respiratory specimens during the  acute phase of infection. Negative results do not preclude SARS-CoV-2 infection, do not rule out co-infections with other pathogens, and should not be used as the sole basis for treatment or other patient management decisions. Negative results must be combined with clinical observations, patient history, and epidemiological information. The expected result is Negative. Fact Sheet for Patients: SugarRoll.be Fact Sheet for Healthcare Providers: https://www.woods-mathews.com/ This test is not yet approved or cleared by the [place] FDA and  has been authorized for detection and/or diagnosis of SARS-CoV-2 by FDA under an Emergency Use Authorization (EUA). This EUA will remain  in effect (meaning this test can be used) for the duration of the COVID-19 declaration under Section 56 4(b)(1) of the Act, 21 U.S.C. section 360bbb-3(b)(1), unless the authorization is terminated or revoked sooner. Performed at [facility], [place] [address] [place], [place] [address]      Radiology Studies: No results found.  Deanta Mincey  Cruzita Lederer, MD, PhD [facility]  Between 7 am - 7 pm I am available, please contact me via Amion or Securechat  Between 7 pm - 7 am I am not available, please contact night coverage MD/APP via Amion

## 2019-11-30 NOTE — Progress Notes (Signed)
Physical Therapy Treatment Patient Details Name: [name] MRN: [id number] DOB: 07-Apr-1935 Today's Date: 11/30/2019    History of Present Illness 85 yo female admitted to ED on 1/8 after family found pt covered in urine and too weak to ambulate at home. MRI negative for acute abnormality. PMH includes OA, CAD, COPD, HOH, HTN, CAD with history of NSTEMI and stenting, depression.    PT Comments    Pt appears confused however agreeable to mobilize.  Pt unable stand with +1 assist, required +2 to rise and steady and then pivoted to recliner.  Continue to recommend SNF upon d/c.   Follow Up Recommendations  SNF;Supervision/Assistance - 24 hour     Equipment Recommendations  None recommended by PT    Recommendations for Other Services       Precautions / Restrictions Precautions Precautions: Fall Precaution Comments: on 2LO2 chronically    Mobility  Bed Mobility Overal bed mobility: Needs Assistance Bed Mobility: Supine to Sit     Supine to sit: Mod assist;HOB elevated     General bed mobility comments: assist for trunk upright  Transfers Overall transfer level: Needs assistance Equipment used: Rolling walker (2 wheeled) Transfers: Sit to/from [facility] Sit to Stand: Max assist;+2 physical assistance;+2 safety/equipment Stand pivot transfers: Min assist;+2 safety/equipment;+2 physical assistance       General transfer comment: attempted with one person however pt started pulling on therapist's arm so requested second person and pt provided with multiple cues to hold/push down on RW, assist for steadying with pivot to recliner  Ambulation/Gait             General Gait Details: unable   Stairs             Wheelchair Mobility    Modified Rankin (Stroke Patients Only)       Balance                                            Cognition Arousal/Alertness: Awake/alert   Overall Cognitive Status:  Impaired/Different from baseline Area of Impairment: Orientation;Following commands;Safety/judgement;Problem solving                 Orientation Level: Disoriented to;Situation;Time;Place     Following Commands: Follows one step commands consistently Safety/Judgement: Decreased awareness of safety;Decreased awareness of deficits   Problem Solving: Difficulty sequencing;Requires verbal cues;Requires tactile cues General Comments: pt appears confused at times, very [facility]      Exercises      General Comments        Pertinent Vitals/Pain Pain Assessment: Faces Faces Pain Scale: Hurts little more Pain Location: back Pain Descriptors / Indicators: Sore;Discomfort;Grimacing Pain Intervention(s): Repositioned;Monitored during session    Home Living                      Prior Function            PT Goals (current goals can now be found in the care plan section) Progress towards PT goals: Progressing toward goals    Frequency    Min 2X/week      PT Plan Current plan remains appropriate    Co-evaluation              AM-PAC PT "6 Clicks" Mobility   Outcome Measure  Help needed turning from your back to your side while in a flat bed without  using bedrails?: A Lot Help needed moving from lying on your back to sitting on the side of a flat bed without using bedrails?: A Lot Help needed moving to and from a bed to a chair (including a wheelchair)?: A Lot Help needed standing up from a chair using your arms (e.g., wheelchair or bedside chair)?: Total Help needed to walk in hospital room?: Total Help needed climbing 3-5 steps with a railing? : Total 6 Click Score: 9    End of Session Equipment Utilized During Treatment: Gait belt;Oxygen Activity Tolerance: Patient limited by fatigue Patient left: in chair;with call bell/phone within reach;with chair alarm set Nurse Communication: Mobility status PT Visit Diagnosis: Difficulty in walking, not elsewhere  classified (R26.2);Muscle weakness (generalized) (M62.81)     Time: [id number] PT Time Calculation (min) (ACUTE ONLY): 17 min  Charges:  $Therapeutic Activity: 8-22 mins                    Arlyce Dice, DPT Acute Rehabilitation Services Office: [phone]   [name] 11/30/2019, 12:39 PM

## 2019-12-01 DIAGNOSIS — M791 Myalgia, unspecified site: Secondary | ICD-10-CM

## 2019-12-01 LAB — BASIC METABOLIC PANEL
Anion gap: 6 (ref 5–15)
BUN: 25 mg/dL — ABNORMAL HIGH (ref 8–23)
CO2: 35 mmol/L — ABNORMAL HIGH (ref 22–32)
Calcium: 9.2 mg/dL (ref 8.9–10.3)
Chloride: 101 mmol/L (ref 98–111)
Creatinine, Ser: 1.1 mg/dL — ABNORMAL HIGH (ref 0.44–1.00)
GFR calc Af Amer: 54 mL/min — ABNORMAL LOW (ref >60)
GFR calc non Af Amer: 47 mL/min — ABNORMAL LOW (ref >60)
Glucose, Bld: 147 mg/dL — ABNORMAL HIGH (ref 70–99)
Potassium: 5.3 mmol/L — ABNORMAL HIGH (ref 3.5–5.1)
Sodium: 142 mmol/L (ref 135–145)

## 2019-12-01 LAB — CBC
HCT: 42.9 % (ref 36.0–46.0)
Hemoglobin: 12.8 g/dL (ref 12.0–15.0)
MCH: 30.5 pg (ref 26.0–34.0)
MCHC: 29.8 g/dL — ABNORMAL LOW (ref 30.0–36.0)
MCV: 102.1 fL — ABNORMAL HIGH (ref 80.0–100.0)
Platelets: 213 10*3/uL (ref 150–400)
RBC: 4.2 MIL/uL (ref 3.87–5.11)
RDW: 12.7 % (ref 11.5–15.5)
WBC: 10.1 10*3/uL (ref 4.0–10.5)
nRBC: 0 % (ref 0.0–0.2)

## 2019-12-01 LAB — GLUCOSE, CAPILLARY: Glucose-Capillary: 128 mg/dL — ABNORMAL HIGH (ref 70–99)

## 2019-12-01 NOTE — TOC Progression Note (Signed)
Transition of Care [facility]) - Progression Note    Patient Details  Name: [name] MRN: [id number] Date of Birth: [birth year]  Transition of [facility]) CM/SW Contact  Kashmere Daywalt, Juliann Pulse, RN Phone Number: 12/01/2019, 3:17 PM  Clinical Narrative:   Bed offers given to son David-await choice.    Expected Discharge Plan: [place] Barriers to Discharge: Continued Medical Work up  Expected Discharge Plan and Services Expected Discharge Plan: [place]   Discharge Planning Services: CM Consult   Living arrangements for the past 2 months: Single Family Home                                       Social Determinants of Health (SDOH) Interventions    Readmission Risk Interventions No flowsheet data found.

## 2019-12-01 NOTE — TOC Progression Note (Signed)
Transition of Care [facility]) - Progression Note    Patient Details  Name: GALAXY VICKERY MRN: [id number] Date of Birth: [birth year]  [facility]) CM/SW Contact  Kynzley Dowson, Juliann Pulse, RN Phone Number: 12/01/2019, 10:15 AM  Clinical Narrative:  Son [name] agreed to SNF.Faxed out await bed offers. covid neg.    Expected Discharge Plan: [place] Barriers to Discharge: Continued Medical Work up  Expected Discharge Plan and Services Expected Discharge Plan: [place]   Discharge Planning Services: CM Consult   Living arrangements for the past 2 months: Single Family Home                                       Social Determinants of Health (SDOH) Interventions    Readmission Risk Interventions No flowsheet data found.

## 2019-12-01 NOTE — Progress Notes (Signed)
PROGRESS NOTE    [name]  [id number] DOB: [birth year] DOA: 11/26/2019 PCP: Leonard Downing, MD    Brief Narrative:  85 year old female with CAD status post stents, COPD, CKD 3, HTN who was brought to the ER after she was found laying on her bed with urine and feces around her, unable to get out of bed for the past 2 days. She was complaining of pain all over her body. On my evaluation after admission she appears confused. She is alert but does not really answer any of my questions or her answers are quite tangential. Her mental status has gradually improved.    Assessment & Plan:   Active Problems:   Weakness generalized   Acute metabolic encephalopathy  Principal Problem Acute metabolic encephalopathy/generalized weakness -MRI of the brain was negative for acute stroke however did show some old lacunar strokes -Infectious work-up negative, urinalysis, chest x-ray all unremarkable.  She is being monitored off antibiotics and remains afebrile and without leukocytosis -TSH was normal at 0.9 -Per Dr. Cruzita Lederer, son had mentioned to me that patient has been having episodes like this in the past, the usually last 1 day and then she returns to normal -With all the work-up negative possibly medication induced, family is helping and assisting with her medication except for the gabapentin which patient keeps by bedside and apparently has 2 bottles next to where she sleeps.  She was placed here on low-dose scheduled gabapentin and seems to be improving and stable.  -Consideration for possible underlying dementia  Active Problems Paroxysmal A. Fib, new -Patient reported intermittent fluttering and hearing are heartbeat even at home on several occasions in the past. During her hospital stay she flipped back and forth between A fib with RVR and sinus rhythm. Started on BB with good response.  -Start Eliquis,CHADSVASC score is 5 -Obtain 2D echo, EF 55-60%, rest as below -Flips back  and forth between sinus and A. fib with RVR.  Started on metoprolol  COPD with chronic hypoxic respiratory failure -follows with pulmonary as an outpatient. Has history of 30 pack year smoking, quit 2016 -Recently increased wheezing and sob, now on  Prednisone -This AM, pt appeared to have decreased air movement and poor inspiratory effort, improved later in the day -Continue albuterol, pulmicort nebs  HTN -continue amlodipine, metoprolol -Stable at this time, vitals reviewed  Coronary artery disease status post stenting -No chest pain, troponin flat, slightly elevated probably representing demand ischemia -Stable currently  Chronic kidney disease stage IIIa -Creatinine appears at baseline -Repeat BMET in AM  DVT prophylaxis: Eliquis Code Status: Full Family Communication: Pt at bedside, family not at bedside Disposition Plan: SNF pending  Consultants:     Procedures:     Antimicrobials: Anti-infectives (From admission, onward)   None       Subjective: Without complaints this afternoon, earlier in the day, pt lethargic  Objective: Vitals:   12/01/19 0606 12/01/19 0728 12/01/19 1428 12/01/19 1517  BP:   (!) 130/55   Pulse:   (!) 53   Resp:   16   Temp:      TempSrc:      SpO2:  97% 94% 92%  Weight: 101.2 kg     Height:        Intake/Output Summary (Last 24 hours) at 12/01/2019 1627 Last data filed at 12/01/2019 1300 Gross per 24 hour  Intake 480 ml  Output 1000 ml  Net -520 ml   Filed Weights   11/28/19 0500 11/29/19  0500 12/01/19 0606  Weight: 93.9 kg 99.4 kg 101.2 kg    Examination:  General exam: Appears calm and comfortable  Respiratory system: Clear to auscultation. Decreased resp effort Cardiovascular system: S1 & S2 heard, Regular Gastrointestinal system: Abdomen is nondistended, soft and nontender. No organomegaly or masses felt. Normal bowel sounds heard. Central nervous system: Alert and oriented. No focal neurological  deficits. Extremities: Symmetric 5 x 5 power. Skin: No rashes, lesions  Psychiatry: Judgement and insight appear normal. Mood & affect appropriate.   Data Reviewed: I have personally reviewed following labs and imaging studies  CBC: Recent Labs  Lab 11/26/19 1846 11/27/19 0516 11/28/19 0512 12/01/19 0521  WBC 7.9 8.9 7.9 10.1  HGB 14.5 13.9 13.6 12.8  HCT 47.7* 45.9 44.3 42.9  MCV 99.8 100.4* 100.0 102.1*  PLT 256 230 213 [id number]   Basic Metabolic Panel: Recent Labs  Lab 11/26/19 1846 11/27/19 0516 11/28/19 0512 11/29/19 0454 12/01/19 0521  NA 144 144 144 144 142  K 3.6 3.4* 3.3* 3.7 5.3*  CL 96* 98 101 100 101  CO2 34* 34* 31 32 35*  GLUCOSE 131* 150* 131* 139* 147*  BUN 21 21 22 23  25*  CREATININE 1.19* 1.07* 1.06* 1.28* 1.10*  CALCIUM 9.6 9.0 9.0 8.7* 9.2   GFR: Estimated Creatinine Clearance: 45.6 mL/min (A) (by C-G formula based on SCr of 1.1 mg/dL (H)). Liver Function Tests: Recent Labs  Lab 11/26/19 1846 11/28/19 0512  AST 20 19  ALT 26 19  ALKPHOS 74 57  BILITOT 0.7 0.5  PROT 7.3 6.1*  ALBUMIN 4.0 3.4*   Recent Labs  Lab 11/26/19 1846  LIPASE 24   No results for input(s): AMMONIA in the last 168 hours. Coagulation Profile: No results for input(s): INR, PROTIME in the last 168 hours. Cardiac Enzymes: Recent Labs  Lab 11/27/19 0516  CKTOTAL 285*   BNP (last 3 results) No results for input(s): PROBNP in the last 8760 hours. HbA1C: No results for input(s): HGBA1C in the last 72 hours. CBG: Recent Labs  Lab 12/01/19 0652  GLUCAP 128*   Lipid Profile: No results for input(s): CHOL, HDL, LDLCALC, TRIG, CHOLHDL, LDLDIRECT in the last 72 hours. Thyroid Function Tests: No results for input(s): TSH, T4TOTAL, FREET4, T3FREE, THYROIDAB in the last 72 hours. Anemia Panel: No results for input(s): VITAMINB12, FOLATE, FERRITIN, TIBC, IRON, RETICCTPCT in the last 72 hours. Sepsis Labs: No results for input(s): PROCALCITON, LATICACIDVEN in the last  168 hours.  Recent Results (from the past 240 hour(s))  Respiratory Panel by RT PCR (Flu A&B, Covid) - Nasopharyngeal Swab     Status: None   Collection Time: 11/26/19  9:38 PM   Specimen: Nasopharyngeal Swab  Result Value Ref Range Status   SARS Coronavirus 2 by RT PCR NEGATIVE NEGATIVE Final    Comment: (NOTE) SARS-CoV-2 target nucleic acids are NOT DETECTED. The SARS-CoV-2 RNA is generally detectable in upper respiratoy specimens during the acute phase of infection. The lowest concentration of SARS-CoV-2 viral copies this assay can detect is 131 copies/mL. A negative result does not preclude SARS-Cov-2 infection and should not be used as the sole basis for treatment or other patient management decisions. A negative result may occur with  improper specimen collection/handling, submission of specimen other than nasopharyngeal swab, presence of viral mutation(s) within the areas targeted by this assay, and inadequate number of viral copies (<131 copies/mL). A negative result must be combined with clinical observations, patient history, and epidemiological information. The expected result is  Negative. Fact Sheet for Patients:  PinkCheek.be Fact Sheet for Healthcare Providers:  GravelBags.it This test is not yet ap proved or cleared by the [place] FDA and  has been authorized for detection and/or diagnosis of SARS-CoV-2 by FDA under an Emergency Use Authorization (EUA). This EUA will remain  in effect (meaning this test can be used) for the duration of the COVID-19 declaration under Section 564(b)(1) of the Act, 21 U.S.C. section 360bbb-3(b)(1), unless the authorization is terminated or revoked sooner.    Influenza A by PCR NEGATIVE NEGATIVE Final   Influenza B by PCR NEGATIVE NEGATIVE Final    Comment: (NOTE) The Xpert Xpress SARS-CoV-2/FLU/RSV assay is intended as an aid in  the diagnosis of influenza from  Nasopharyngeal swab specimens and  should not be used as a sole basis for treatment. Nasal washings and  aspirates are unacceptable for Xpert Xpress SARS-CoV-2/FLU/RSV  testing. Fact Sheet for Patients: PinkCheek.be Fact Sheet for Healthcare Providers: GravelBags.it This test is not yet approved or cleared by the [place] FDA and  has been authorized for detection and/or diagnosis of SARS-CoV-2 by  FDA under an Emergency Use Authorization (EUA). This EUA will remain  in effect (meaning this test can be used) for the duration of the  Covid-19 declaration under Section 564(b)(1) of the Act, 21  U.S.C. section 360bbb-3(b)(1), unless the authorization is  terminated or revoked. Performed at [facility], [place] [address], [place], [place] [address]   SARS CORONAVIRUS 2 (TAT 6-24 HRS) Nasopharyngeal Nasopharyngeal Swab     Status: None   Collection Time: 11/29/19  9:24 PM   Specimen: Nasopharyngeal Swab  Result Value Ref Range Status   SARS Coronavirus 2 NEGATIVE NEGATIVE Final    Comment: (NOTE) SARS-CoV-2 target nucleic acids are NOT DETECTED. The SARS-CoV-2 RNA is generally detectable in upper and lower respiratory specimens during the acute phase of infection. Negative results do not preclude SARS-CoV-2 infection, do not rule out co-infections with other pathogens, and should not be used as the sole basis for treatment or other patient management decisions. Negative results must be combined with clinical observations, patient history, and epidemiological information. The expected result is Negative. Fact Sheet for Patients: SugarRoll.be Fact Sheet for Healthcare Providers: https://www.woods-mathews.com/ This test is not yet approved or cleared by the [place] FDA and  has been authorized for detection and/or diagnosis of SARS-CoV-2 by FDA under an Emergency  Use Authorization (EUA). This EUA will remain  in effect (meaning this test can be used) for the duration of the COVID-19 declaration under Section 56 4(b)(1) of the Act, 21 U.S.C. section 360bbb-3(b)(1), unless the authorization is terminated or revoked sooner. Performed at [facility], [place] [address], [place], [place] [address]      Radiology Studies: No results found.  Scheduled Meds: . amLODipine  10 mg Oral Daily  . apixaban  5 mg Oral BID  . arformoterol  15 mcg Nebulization BID  . aspirin EC  81 mg Oral Q supper  . budesonide  0.25 mg Nebulization BID  . busPIRone  10 mg Oral BID  . gabapentin  100 mg Oral TID  . isosorbide mononitrate  120 mg Oral Daily  . lubiprostone  8 mcg Oral Q12H  . metoprolol tartrate  12.5 mg Oral BID  . predniSONE  40 mg Oral Q breakfast  . Revefenacin  3 mL Inhalation Daily  . sodium chloride flush  3 mL Intravenous Once   Continuous Infusions:  LOS: 4 days   Marylu Lund, MD [facility] Pager On Amion  If 7PM-7AM, please contact night-coverage 12/01/2019, 4:27 PM

## 2019-12-01 NOTE — NC FL2 (Signed)
[place] LEVEL OF CARE SCREENING TOOL     IDENTIFICATION  Patient Name: [name] Birthdate: May 26, 1935 Sex: female Admission Date (Current Location): 11/26/2019  [facility] and [place] Number:  Herbalist and Address:  [facility],  [place], [place]      Provider Number: [id number]  Attending Physician Name and Address:  Donne Hazel, MD  Relative Name and Phone Number:  Cletus Gash [phone]    Current Level of Care: Hospital Recommended Level of Care: [place] Prior Approval Number:    Date Approved/Denied:   PASRR Number: [id number]  Discharge Plan:      Current Diagnoses: Patient Active Problem List   Diagnosis Date Noted  . Acute metabolic encephalopathy 123XX123  . Weakness generalized 11/26/2019  . Coronary artery disease involving native coronary artery of native heart with angina pectoris ([place]) 04/06/2019  . Dyslipidemia 04/06/2019  . Shortness of breath 04/06/2019  . Educated about COVID-19 virus infection 04/06/2019  . Acute on chronic diastolic heart failure ([place])   . NSTEMI (non-ST elevated myocardial infarction) ([place]) 08/28/2018  . Acute kidney injury ([place]) 08/28/2018  . Acute exacerbation of chronic obstructive pulmonary disease (COPD) ([place]) 08/28/2018  . GERD (gastroesophageal reflux disease) 04/14/2018  . Back pain 04/14/2018  . Right flank pain 04/14/2018  . COPD, group D, by GOLD 2017 classification ([place]) 10/06/2017  . Depression 10/05/2017  . Rectal pain 10/05/2017  . AP (abdominal pain)   . Atypical chest pain 11/28/2014  . Chest pain 11/28/2014  . Tobacco abuse 11/28/2014  . Skin cancer of nose   . Squamous cell carcinoma of skin of other and unspecified parts of face 10/15/2013  . COPD exacerbation ([place]) 01/30/2013  . Abdominal pain 01/30/2013  . Constipation 01/30/2013  . Hypokalemia 01/30/2013  . Leukocytosis 05/14/2012  . HTN (hypertension)  05/14/2012  . Progressive angina ([place]) 05/07/2012  . Hyperlipemia   . Dysphagia, unspecified(787.20) 02/04/2012  . Esophageal reflux 02/04/2012  . Coronary atherosclerosis 08/28/2010  . ANAL OR RECTAL PAIN 08/28/2010  . NAUSEA ALONE 08/28/2010    Orientation RESPIRATION BLADDER Height & Weight     Self    Incontinent Weight: 101.2 kg Height:  5\' 4"  (162.6 cm)  BEHAVIORAL SYMPTOMS/MOOD NEUROLOGICAL BOWEL NUTRITION STATUS      Incontinent Diet(Heart Healthy)  AMBULATORY STATUS COMMUNICATION OF NEEDS Skin   Limited Assist Verbally Normal                       Personal Care Assistance Level of Assistance  Bathing, Feeding, Dressing   Feeding assistance: Limited assistance Dressing Assistance: Limited assistance     Functional Limitations Info  Sight, Hearing, Speech Sight Info: Adequate Hearing Info: Impaired(HOH) Speech Info: Impaired(upper dentures)    SPECIAL CARE FACTORS FREQUENCY  PT (By licensed PT), OT (By licensed OT)     PT Frequency: 5x week OT Frequency: 5x week            Contractures Contractures Info: Not present    Additional Factors Info  Code Status, Allergies Code Status Info: Full Allergies Info: Iohexol, Lipitor           Current Medications (12/01/2019):  This is the current hospital active medication list Current Facility-Administered Medications  Medication Dose Route Frequency Provider Last Rate Last Admin  . acetaminophen (TYLENOL) tablet 650 mg  650 mg Oral Q6H PRN Rise Patience, MD   650 mg at 11/28/19  1432   Or  . acetaminophen (TYLENOL) suppository 650 mg  650 mg Rectal Q6H PRN Rise Patience, MD      . albuterol (PROVENTIL) (2.5 MG/3ML) 0.083% nebulizer solution 3 mL  3 mL Nebulization Q4H PRN Rise Patience, MD   3 mL at 11/30/19 1342  . amitriptyline (ELAVIL) tablet 10-20 mg  10-20 mg Oral QHS PRN Rise Patience, MD   20 mg at 11/30/19 2146  . amLODipine (NORVASC) tablet 10 mg  10 mg Oral Daily  Caren Griffins, MD   10 mg at 11/30/19 1028  . apixaban (ELIQUIS) tablet 5 mg  5 mg Oral BID Adrian Saran, RPH   5 mg at 11/30/19 2146  . arformoterol (BROVANA) nebulizer solution 15 mcg  15 mcg Nebulization BID Rise Patience, MD   15 mcg at 12/01/19 [id number]  . aspirin EC tablet 81 mg  81 mg Oral Q supper Rise Patience, MD   81 mg at 11/30/19 1555  . budesonide (PULMICORT) nebulizer solution 0.25 mg  0.25 mg Nebulization BID Rise Patience, MD   0.25 mg at 12/01/19 [id number]  . busPIRone (BUSPAR) tablet 10 mg  10 mg Oral BID Rise Patience, MD   10 mg at 11/30/19 2146  . dicyclomine (BENTYL) tablet 20 mg  20 mg Oral TID PRN Rise Patience, MD      . famotidine (PEPCID) tablet 20 mg  20 mg Oral Q12H PRN Rise Patience, MD      . gabapentin (NEURONTIN) capsule 100 mg  100 mg Oral TID Caren Griffins, MD   100 mg at 11/30/19 2146  . hydrALAZINE (APRESOLINE) injection 10 mg  10 mg Intravenous Q4H PRN Rise Patience, MD   10 mg at 11/29/19 1221  . isosorbide mononitrate (IMDUR) 24 hr tablet 120 mg  120 mg Oral Daily Rise Patience, MD   120 mg at 11/30/19 1028  . lubiprostone (AMITIZA) capsule 8 mcg  8 mcg Oral Q12H Rise Patience, MD   8 mcg at 11/30/19 1028  . metoprolol tartrate (LOPRESSOR) tablet 12.5 mg  12.5 mg Oral BID Caren Griffins, MD   12.5 mg at 11/30/19 2146  . nitroGLYCERIN (NITROSTAT) SL tablet 0.4 mg  0.4 mg Sublingual Q5 min PRN Rise Patience, MD      . ondansetron [facility]) tablet 4 mg  4 mg Oral Q6H PRN Rise Patience, MD       Or  . ondansetron [facility]) injection 4 mg  4 mg Intravenous Q6H PRN Rise Patience, MD      . predniSONE (DELTASONE) tablet 40 mg  40 mg Oral Q breakfast Caren Griffins, MD   40 mg at 11/30/19 1414  . Revefenacin SOLN 3 mL  3 mL Inhalation Daily Rise Patience, MD      . sodium chloride flush (NS) 0.9 % injection 3 mL  3 mL Intravenous Once Rise Patience, MD   Stopped  at 11/26/19 1953     Discharge Medications: Please see discharge summary for a list of discharge medications.  Relevant Imaging Results:  Relevant Lab Results:   Additional Information SS#: [id number]  Sharion Grieves, Juliann Pulse, RN

## 2019-12-01 NOTE — Care Management Important Message (Signed)
Important Message  Patient Details IM Letter given to Dessa Phi RN Case Manager to present to the Patient Name: [name] MRN: [id number] Date of Birth: Feb 12, 1935   Medicare Important Message Given:  Yes     Kerin Salen 12/01/2019, 12:49 PM

## 2019-12-02 LAB — COMPREHENSIVE METABOLIC PANEL
ALT: 22 U/L (ref 0–44)
AST: 23 U/L (ref 15–41)
Albumin: 3.5 g/dL (ref 3.5–5.0)
Alkaline Phosphatase: 49 U/L (ref 38–126)
Anion gap: 6 (ref 5–15)
BUN: 33 mg/dL — ABNORMAL HIGH (ref 8–23)
CO2: 38 mmol/L — ABNORMAL HIGH (ref 22–32)
Calcium: 9 mg/dL (ref 8.9–10.3)
Chloride: 96 mmol/L — ABNORMAL LOW (ref 98–111)
Creatinine, Ser: 1.17 mg/dL — ABNORMAL HIGH (ref 0.44–1.00)
GFR calc Af Amer: 50 mL/min — ABNORMAL LOW (ref >60)
GFR calc non Af Amer: 43 mL/min — ABNORMAL LOW (ref >60)
Glucose, Bld: 202 mg/dL — ABNORMAL HIGH (ref 70–99)
Potassium: 5.2 mmol/L — ABNORMAL HIGH (ref 3.5–5.1)
Sodium: 140 mmol/L (ref 135–145)
Total Bilirubin: 0.6 mg/dL (ref 0.3–1.2)
Total Protein: 6.3 g/dL — ABNORMAL LOW (ref 6.5–8.1)

## 2019-12-02 LAB — CBC
HCT: 42 % (ref 36.0–46.0)
Hemoglobin: 12.6 g/dL (ref 12.0–15.0)
MCH: 31 pg (ref 26.0–34.0)
MCHC: 30 g/dL (ref 30.0–36.0)
MCV: 103.2 fL — ABNORMAL HIGH (ref 80.0–100.0)
Platelets: 191 10*3/uL (ref 150–400)
RBC: 4.07 MIL/uL (ref 3.87–5.11)
RDW: 12.7 % (ref 11.5–15.5)
WBC: 11.4 10*3/uL — ABNORMAL HIGH (ref 4.0–10.5)
nRBC: 0 % (ref 0.0–0.2)

## 2019-12-02 LAB — SARS CORONAVIRUS 2 (TAT 6-24 HRS): SARS Coronavirus 2: NEGATIVE

## 2019-12-02 NOTE — TOC Progression Note (Signed)
Transition of Care [facility]) - Progression Note    Patient Details  Name: VASHTIE FORST MRN: [id number] Date of Birth: 12-05-1934  Transition of Care [facility]) CM/SW Contact  Zachary Lovins, Juliann Pulse, RN Phone Number: 12/02/2019, 12:25 PM  Clinical Narrative: Son [name] chose [facility] await response from [facility] if bed available. covid 1/11 neg.      Expected Discharge Plan: [name] Barriers to Discharge: Continued Medical Work up  Expected Discharge Plan and Services Expected Discharge Plan: [place]   Discharge Planning Services: CM Consult   Living arrangements for the past 2 months: Single Family Home                                       Social Determinants of Health (SDOH) Interventions    Readmission Risk Interventions No flowsheet data found.

## 2019-12-02 NOTE — Progress Notes (Signed)
PROGRESS NOTE    [name]  [id number] DOB: 07-26-1935 DOA: 11/26/2019 PCP: Leonard Downing, MD    Brief Narrative:  85 year old female with CAD status post stents, COPD, CKD 3, HTN who was brought to the ER after she was found laying on her bed with urine and feces around her, unable to get out of bed for the past 2 days. She was complaining of pain all over her body. On my evaluation after admission she appears confused. She is alert but does not really answer any of my questions or her answers are quite tangential. Her mental status has gradually improved.    Assessment & Plan:   Active Problems:   Weakness generalized   Acute metabolic encephalopathy  Principal Problem Acute metabolic encephalopathy/generalized weakness -MRI of the brain was negative for acute stroke however did show some old lacunar strokes -Infectious work-up negative, urinalysis, chest x-ray all unremarkable.  She is being monitored off antibiotics and remains afebrile and without leukocytosis -TSH was normal at 0.9 -Per Dr. Cruzita Lederer, son had mentioned to me that patient has been having episodes like this in the past, the usually last 1 day and then she returns to normal -With all the work-up negative possibly medication induced, family is helping and assisting with her medication except for the gabapentin which patient keeps by bedside and apparently has 2 bottles next to where she sleeps.  She was placed here on low-dose scheduled gabapentin and seems to be improving and stable.  -Consideration for possible underlying dementia -Pt seems more alert this AM  Active Problems Paroxysmal A. Fib, new -Patient reported intermittent fluttering and hearing her heartbeat occasionally prior to admit. During her hospital stay she flipped back and forth between A fib with RVR and sinus rhythm. Started on BB with good response.  -Start Eliquis,CHADSVASC score is 5 -Obtain 2D echo, EF 55-60%, rest as  below -Flips back and forth between sinus and A. fib with RVR.  Continue metoprolol  COPD with chronic hypoxic respiratory failure -follows with pulmonary as an outpatient. Has history of 30 pack year smoking, quit 2016 -Recently increased wheezing and sob, now on  Prednisone -Respiratory status seems improved. Mentation improved -Continue albuterol, pulmicort nebs  HTN -continue amlodipine, metoprolol -Stable at this time, vitals reviewed  Coronary artery disease status post stenting -No chest pain, troponin flat, slightly elevated probably representing demand ischemia -Stable at this time  Chronic kidney disease stage IIIa -Creatinine appears at baseline -recheck bmet in AM  DVT prophylaxis: Eliquis Code Status: Full Family Communication: Pt at bedside, family not at bedside Disposition Plan: SNF pending  Consultants:     Procedures:     Antimicrobials: Anti-infectives (From admission, onward)   None      Subjective: No complaints this afternoon  Objective: Vitals:   12/02/19 0427 12/02/19 0807 12/02/19 1233 12/02/19 1325  BP: (!) 142/70   (!) 147/81  Pulse: (!) 56  (!) 55 68  Resp: 20   20  Temp: 98.7 F (37.1 C)   (!) 97.5 F (36.4 C)  TempSrc:    Oral  SpO2: 100% 95%  99%  Weight:      Height:        Intake/Output Summary (Last 24 hours) at 12/02/2019 1617 Last data filed at 12/02/2019 1440 Gross per 24 hour  Intake 400 ml  Output --  Net 400 ml   Filed Weights   11/28/19 0500 11/29/19 0500 12/01/19 0606  Weight: 93.9 kg 99.4 kg 101.2  kg    Examination: General exam: Awake, laying in bed, in nad Respiratory system: Normal respiratory effort, no wheezing Cardiovascular system: regular rate, s1, s2 Gastrointestinal system: Soft, nondistended, positive BS Central nervous system: CN2-12 grossly intact, strength intact Extremities: Perfused, no clubbing Skin: Normal skin turgor, no notable skin lesions seen Psychiatry: Mood normal // no  visual hallucinations   Data Reviewed: I have personally reviewed following labs and imaging studies  CBC: Recent Labs  Lab 11/26/19 1846 11/27/19 0516 11/28/19 0512 12/01/19 0521 12/02/19 0423  WBC 7.9 8.9 7.9 10.1 11.4*  HGB 14.5 13.9 13.6 12.8 12.6  HCT 47.7* 45.9 44.3 42.9 42.0  MCV 99.8 100.4* 100.0 102.1* 103.2*  PLT 256 230 213 213 [id number]   Basic Metabolic Panel: Recent Labs  Lab 11/27/19 0516 11/28/19 0512 11/29/19 0454 12/01/19 0521 12/02/19 0423  NA 144 144 144 142 140  K 3.4* 3.3* 3.7 5.3* 5.2*  CL 98 101 100 101 96*  CO2 34* 31 32 35* 38*  GLUCOSE 150* 131* 139* 147* 202*  BUN 21 22 23  25* 33*  CREATININE 1.07* 1.06* 1.28* 1.10* 1.17*  CALCIUM 9.0 9.0 8.7* 9.2 9.0   GFR: Estimated Creatinine Clearance: 42.9 mL/min (A) (by C-G formula based on SCr of 1.17 mg/dL (H)). Liver Function Tests: Recent Labs  Lab 11/26/19 1846 11/28/19 0512 12/02/19 0423  AST 20 19 23   ALT 26 19 22   ALKPHOS 74 57 49  BILITOT 0.7 0.5 0.6  PROT 7.3 6.1* 6.3*  ALBUMIN 4.0 3.4* 3.5   Recent Labs  Lab 11/26/19 1846  LIPASE 24   No results for input(s): AMMONIA in the last 168 hours. Coagulation Profile: No results for input(s): INR, PROTIME in the last 168 hours. Cardiac Enzymes: Recent Labs  Lab 11/27/19 0516  CKTOTAL 285*   BNP (last 3 results) No results for input(s): PROBNP in the last 8760 hours. HbA1C: No results for input(s): HGBA1C in the last 72 hours. CBG: Recent Labs  Lab 12/01/19 0652  GLUCAP 128*   Lipid Profile: No results for input(s): CHOL, HDL, LDLCALC, TRIG, CHOLHDL, LDLDIRECT in the last 72 hours. Thyroid Function Tests: No results for input(s): TSH, T4TOTAL, FREET4, T3FREE, THYROIDAB in the last 72 hours. Anemia Panel: No results for input(s): VITAMINB12, FOLATE, FERRITIN, TIBC, IRON, RETICCTPCT in the last 72 hours. Sepsis Labs: No results for input(s): PROCALCITON, LATICACIDVEN in the last 168 hours.  Recent Results (from the past 240  hour(s))  Respiratory Panel by RT PCR (Flu A&B, Covid) - Nasopharyngeal Swab     Status: None   Collection Time: 11/26/19  9:38 PM   Specimen: Nasopharyngeal Swab  Result Value Ref Range Status   SARS Coronavirus 2 by RT PCR NEGATIVE NEGATIVE Final    Comment: (NOTE) SARS-CoV-2 target nucleic acids are NOT DETECTED. The SARS-CoV-2 RNA is generally detectable in upper respiratoy specimens during the acute phase of infection. The lowest concentration of SARS-CoV-2 viral copies this assay can detect is 131 copies/mL. A negative result does not preclude SARS-Cov-2 infection and should not be used as the sole basis for treatment or other patient management decisions. A negative result may occur with  improper specimen collection/handling, submission of specimen other than nasopharyngeal swab, presence of viral mutation(s) within the areas targeted by this assay, and inadequate number of viral copies (<131 copies/mL). A negative result must be combined with clinical observations, patient history, and epidemiological information. The expected result is Negative. Fact Sheet for Patients:  PinkCheek.be Fact Sheet for Healthcare  Providers:  GravelBags.it This test is not yet ap proved or cleared by the [place] and  has been authorized for detection and/or diagnosis of SARS-CoV-2 by FDA under an Emergency Use Authorization (EUA). This EUA will remain  in effect (meaning this test can be used) for the duration of the COVID-19 declaration under Section 564(b)(1) of the Act, 21 U.S.C. section 360bbb-3(b)(1), unless the authorization is terminated or revoked sooner.    Influenza A by PCR NEGATIVE NEGATIVE Final   Influenza B by PCR NEGATIVE NEGATIVE Final    Comment: (NOTE) The Xpert Xpress SARS-CoV-2/FLU/RSV assay is intended as an aid in  the diagnosis of influenza from Nasopharyngeal swab specimens and  should not be used as  a sole basis for treatment. Nasal washings and  aspirates are unacceptable for Xpert Xpress SARS-CoV-2/FLU/RSV  testing. Fact Sheet for Patients: PinkCheek.be Fact Sheet for Healthcare Providers: GravelBags.it This test is not yet approved or cleared by the [place] FDA and  has been authorized for detection and/or diagnosis of SARS-CoV-2 by  FDA under an Emergency Use Authorization (EUA). This EUA will remain  in effect (meaning this test can be used) for the duration of the  Covid-19 declaration under Section 564(b)(1) of the Act, 21  U.S.C. section 360bbb-3(b)(1), unless the authorization is  terminated or revoked. Performed at [facility], [place] [address], [place], [place] [address]   SARS CORONAVIRUS 2 (TAT 6-24 HRS) Nasopharyngeal Nasopharyngeal Swab     Status: None   Collection Time: 11/29/19  9:24 PM   Specimen: Nasopharyngeal Swab  Result Value Ref Range Status   SARS Coronavirus 2 NEGATIVE NEGATIVE Final    Comment: (NOTE) SARS-CoV-2 target nucleic acids are NOT DETECTED. The SARS-CoV-2 RNA is generally detectable in upper and lower respiratory specimens during the acute phase of infection. Negative results do not preclude SARS-CoV-2 infection, do not rule out co-infections with other pathogens, and should not be used as the sole basis for treatment or other patient management decisions. Negative results must be combined with clinical observations, patient history, and epidemiological information. The expected result is Negative. Fact Sheet for Patients: SugarRoll.be Fact Sheet for Healthcare Providers: https://www.woods-mathews.com/ This test is not yet approved or cleared by the [place] FDA and  has been authorized for detection and/or diagnosis of SARS-CoV-2 by FDA under an Emergency Use Authorization (EUA). This EUA will remain  in effect  (meaning this test can be used) for the duration of the COVID-19 declaration under Section 56 4(b)(1) of the Act, 21 U.S.C. section 360bbb-3(b)(1), unless the authorization is terminated or revoked sooner. Performed at [facility], [place] [address] [place], [place] [address]      Radiology Studies: No results found.  Scheduled Meds: . amLODipine  10 mg Oral Daily  . apixaban  5 mg Oral BID  . arformoterol  15 mcg Nebulization BID  . aspirin EC  81 mg Oral Q supper  . budesonide  0.25 mg Nebulization BID  . busPIRone  10 mg Oral BID  . gabapentin  100 mg Oral TID  . isosorbide mononitrate  120 mg Oral Daily  . lubiprostone  8 mcg Oral Q12H  . metoprolol tartrate  12.5 mg Oral BID  . predniSONE  40 mg Oral Q breakfast  . Revefenacin  3 mL Inhalation Daily  . sodium chloride flush  3 mL Intravenous Once   Continuous Infusions:   LOS: 5 days   Marylu Lund, MD [facility] Pager  On Amion  If 7PM-7AM, please contact night-coverage 12/02/2019, 4:17 PM

## 2019-12-03 LAB — COMPREHENSIVE METABOLIC PANEL
ALT: 24 U/L (ref 0–44)
AST: 16 U/L (ref 15–41)
Albumin: 3.5 g/dL (ref 3.5–5.0)
Alkaline Phosphatase: 54 U/L (ref 38–126)
Anion gap: 7 (ref 5–15)
BUN: 34 mg/dL — ABNORMAL HIGH (ref 8–23)
CO2: 34 mmol/L — ABNORMAL HIGH (ref 22–32)
Calcium: 9.1 mg/dL (ref 8.9–10.3)
Chloride: 98 mmol/L (ref 98–111)
Creatinine, Ser: 1.19 mg/dL — ABNORMAL HIGH (ref 0.44–1.00)
GFR calc Af Amer: 49 mL/min — ABNORMAL LOW (ref >60)
GFR calc non Af Amer: 42 mL/min — ABNORMAL LOW (ref >60)
Glucose, Bld: 174 mg/dL — ABNORMAL HIGH (ref 70–99)
Potassium: 5 mmol/L (ref 3.5–5.1)
Sodium: 139 mmol/L (ref 135–145)
Total Bilirubin: 0.5 mg/dL (ref 0.3–1.2)
Total Protein: 6.3 g/dL — ABNORMAL LOW (ref 6.5–8.1)

## 2019-12-03 MED ORDER — APIXABAN 5 MG PO TABS: 5.0000 mg | ORAL_TABLET | Freq: Two times a day (BID) | ORAL | 0 refills | Status: AC

## 2019-12-03 MED ORDER — AMLODIPINE BESYLATE 10 MG PO TABS: 10.0000 mg | ORAL_TABLET | Freq: Every day | ORAL | 0 refills | Status: AC

## 2019-12-03 NOTE — Progress Notes (Signed)
Report called into [facility] spoke to Nurse [name]

## 2019-12-03 NOTE — TOC Transition Note (Signed)
Transition of Care [facility]) - CM/SW Discharge Note   Patient Details  Name: [name] MRN: [id number] Date of Birth: 1935/08/02  Transition of Care [facility]) CM/SW Contact:  Dessa Phi, RN Phone Number: 12/03/2019, 12:40 PM   Clinical Narrative: d/c today [facility]-rep Claib[facility] following awaiting rm tel# for nurse to call report. Then will call PTAR.      Final next level of care: [place] Barriers to Discharge: No Barriers Identified   Patient Goals and CMS Choice     Choice offered to / list presented to : Adult Children  Discharge Placement              Patient chooses bed at: [facility] Patient to be transferred to facility by: [place] Name of family member notified: [name] son Patient and family notified of of transfer: 12/03/19  Discharge Plan and Services   Discharge Planning Services: CM Consult                                 Social Determinants of Health ([place]) Interventions     Readmission Risk Interventions No flowsheet data found.

## 2019-12-03 NOTE — Progress Notes (Signed)
Physical Therapy Treatment Patient Details Name: [name] MRN: [id number] DOB: 04-Oct-1935 Today's Date: 12/03/2019    History of Present Illness 85 yo female admitted to ED on 1/8 after family found pt covered in urine and too weak to ambulate at home. MRI negative for acute abnormality. PMH includes OA, CAD, COPD, HOH, HTN, CAD with history of NSTEMI and stenting, depression.    PT Comments    Pt's cognition and mobility appears improved since previous session.  Pt currently min assist for mobility and remained on 2L O2 [place].  RT in to see pt upon pt transferring to recliner.  Continue to recommend SNF.   Follow Up Recommendations  SNF;Supervision/Assistance - 24 hour     Equipment Recommendations  None recommended by PT    Recommendations for Other Services       Precautions / Restrictions Precautions Precautions: Fall Precaution Comments: on 2LO2 chronically    Mobility  Bed Mobility               General bed mobility comments: pt sitting EOB with tray table on arrival  Transfers Overall transfer level: Needs assistance Equipment used: Rolling walker (2 wheeled) Transfers: Sit to/from [facility] Sit to Stand: Min assist Stand pivot transfers: Min assist       General transfer comment: verbal and visual cues for hand placement, assist to rise and steady, improved mobility from previous session, pt remained on 2L O2 [place]  Ambulation/Gait                 Stairs             Wheelchair Mobility    Modified Rankin (Stroke Patients Only)       Balance                                            Cognition Arousal/Alertness: Awake/alert Behavior During Therapy: WFL for tasks assessed/performed Overall Cognitive Status: Within Functional Limits for tasks assessed                         Following Commands: Follows one step commands consistently       General Comments: pt appears less  confused then previous session, very HOH, conversation appropriate today and pt easily following simple commands      Exercises      General Comments        Pertinent Vitals/Pain Pain Assessment: No/denies pain Pain Intervention(s): Repositioned    Home Living                      Prior Function            PT Goals (current goals can now be found in the care plan section) Progress towards PT goals: Progressing toward goals    Frequency    Min 2X/week      PT Plan Current plan remains appropriate    Co-evaluation              AM-PAC PT "6 Clicks" Mobility   Outcome Measure  Help needed turning from your back to your side while in a flat bed without using bedrails?: A Little Help needed moving from lying on your back to sitting on the side of a flat bed without using bedrails?: A Little Help needed moving to and from  a bed to a chair (including a wheelchair)?: A Little Help needed standing up from a chair using your arms (e.g., wheelchair or bedside chair)?: A Little Help needed to walk in hospital room?: A Lot Help needed climbing 3-5 steps with a railing? : Total 6 Click Score: 15    End of Session Equipment Utilized During Treatment: Gait belt;Oxygen Activity Tolerance: Patient tolerated treatment well Patient left: in chair;with call bell/phone within reach;with chair alarm set Nurse Communication: Mobility status PT Visit Diagnosis: Difficulty in walking, not elsewhere classified (R26.2);Muscle weakness (generalized) (M62.81)     Time: [id number] PT Time Calculation (min) (ACUTE ONLY): 9 min  Charges:  $Therapeutic Activity: 8-22 mins                    Arlyce Dice, DPT Acute Rehabilitation Services Office: [phone]  Trena Platt 12/03/2019, 4:45 PM

## 2019-12-03 NOTE — TOC Progression Note (Signed)
Transition of Care [facility]) - Progression Note    Patient Details  Name: [name] MRN: [id number] Date of Birth: 03-04-1935  [facility] CM/SW Contact  Kennie Snedden, Juliann Pulse, RN Phone Number: 12/03/2019, 1:07 PM  Clinical Narrative:  Going to rm 2A,nurse call report [phone]. PTAR called.     Expected Discharge Plan: [place] Barriers to Discharge: No Barriers Identified  Expected Discharge Plan and Services Expected Discharge Plan: [place]   Discharge Planning Services: CM Consult   Living arrangements for the past 2 months: Single Family Home Expected Discharge Date: 12/03/19                                     Social Determinants of Health (SDOH) Interventions    Readmission Risk Interventions No flowsheet data found.

## 2019-12-03 NOTE — Progress Notes (Signed)
[name] for apixaban Indication: atrial fibrillation  Allergies  Allergen Reactions  . Iohexol Other (See Comments)    Code:  HIVES, Desc:  PER ROBIN @ [facility], PT IS ALLERGIC TO IVP DYE 08/28/10 RM  . Lipitor [Atorvastatin Calcium] Hives    Patient Measurements: Height: 5\' 4"  (162.6 cm) Weight: 216 lb 8 oz (98.2 kg) IBW/kg (Calculated) : 54.7  Vital Signs: Temp: 97.5 F (36.4 C) (01/15 0424) Temp Source: Oral (01/15 0424) BP: 148/77 (01/15 0424) Pulse Rate: 58 (01/15 0424)  Labs: Recent Labs    12/01/19 0521 12/02/19 0423 12/03/19 0431  HGB 12.8 12.6  --   HCT 42.9 42.0  --   PLT 213 191  --   CREATININE 1.10* 1.17* 1.19*    Estimated Creatinine Clearance: 41.5 mL/min (A) (by C-G formula based on SCr of 1.19 mg/dL (H)).   Medical History: Past Medical History:  Diagnosis Date  . Arthritis    "in my spine" (04/15/2018)  . Blurred vision   . CAD (coronary artery disease)    Multiple percutaneous revascularization. Catheterization May 2013 left main normal, patent LAD stent, 90% circumflex stenosis, patent right coronary artery stents. Patient had a DES to the circumflex. The EF was well-preserved.  . Chronic lower back pain   . COPD (chronic obstructive pulmonary disease) ([place])   . Fatigue   . GERD (gastroesophageal reflux disease)   . Hard of hearing   . HTN (hypertension)   . Hx of radiation therapy    right nose 4500 cGy in 10 sessions over 5 weeks (2 treatments a week)  . Hyperlipemia   . NSTEMI (non-ST elevated myocardial infarction) ([place]) 08/28/2018  . On home oxygen therapy    "2L; 24/7" (08/27/2018)  . Pneumonia    "I've had a touch of it" (04/15/2018)  . Squamous cell carcinoma of nose    right    Medications:  Scheduled:  . amLODipine  10 mg Oral Daily  . apixaban  5 mg Oral BID  . arformoterol  15 mcg Nebulization BID  . aspirin EC  81 mg Oral Q supper  . budesonide  0.25 mg Nebulization BID  . busPIRone   10 mg Oral BID  . gabapentin  100 mg Oral TID  . isosorbide mononitrate  120 mg Oral Daily  . lubiprostone  8 mcg Oral Q12H  . metoprolol tartrate  12.5 mg Oral BID  . predniSONE  40 mg Oral Q breakfast  . Revefenacin  3 mL Inhalation Daily  . sodium chloride flush  3 mL Intravenous Once   Infusions:    Assessment: 85 yo female with Afib to start apixaban per Md orders. Patient does not meet criteria for reduced dose apixaban as must have 2 of 3 of the following criteria: age>80, wt<60kg and Scr>1.5. Note patient received Lovenox 40mg  this AM around 1000. CBC ok.   12/03/2019: Patient tolerating Apixaban, CBC remains stable with no overt bleeding noted.   Plan:  1) Continue Apixaban 5mg  BID 2) No dose adjustments anticipated.  Pharmacy to sign off- will monitor peripherally.   Netta Cedars, PharmD, BCPS 12/03/2019,8:48 AM

## 2019-12-03 NOTE — Discharge Summary (Signed)
Physician Discharge Summary  [name] [id number] DOB: January 05, 1935 DOA: 11/26/2019  PCP: Leonard Downing, MD  Admit date: 11/26/2019 Discharge date: 12/03/2019  Admitted From: Home Disposition:  SNF  Recommendations for Outpatient Follow-up:  1. Follow up with PCP in 1-2 weeks 2. Recommend repeat BMET in 1-2 weeks 3. Consider arranging outpatient sleep study  Discharge Condition:Improved CODE STATUS:Full Diet recommendation: Heart healthy   Brief/Interim Summary: 85 year old female with CAD status post stents, COPD, CKD 3, HTN who was brought to the ER after she was found laying on her bed with urine and feces around her, unable to get out of bed for the past 2 days. She was complaining of pain all over her body. On my evaluation after admission she appears confused. She is alert but does not really answer any of my questions or her answers are quite tangential.Her mental status has gradually improved.  Discharge Diagnoses:  Active Problems:   Weakness generalized   Acute metabolic encephalopathy  Principal Problem Acute metabolic encephalopathy/generalized weakness -MRI of the brain was negative for acute stroke however did show some old lacunar strokes -Infectious work-up negative, urinalysis, chest x-ray all unremarkable. She is being monitored off antibiotics and remains afebrile and without leukocytosis -TSH was normal at 0.9 -Per Dr. Cruzita Lederer, son had mentioned that patient has been having episodes like this in the past, the usually last 1 day and then she returns to normal -With all the work-up negative possibly medication induced, family is helping and assisting with her medication except for the gabapentin which patient keeps by bedside and apparently has 2 bottles next to where she sleeps. She was placed here on low-dose scheduled gabapentin and seems to be improving and stable.  -Consideration for possible underlying dementia -Remains stable and alert  this AM  Active Problems Paroxysmal A. Fib, new -Patient reported intermittent fluttering and hearing her heartbeat occasionally prior to admit.During her hospital stay she flipped back and forth between A fib with RVR and sinus rhythm. Started on BB with good response. -Start Eliquis,CHADSVASC score is 5 -Obtain 2D echo,EF 55-60%, rest as below -Flips back and forth between sinus and A. fib with RVR.Continue metoprolol  COPD with chronic hypoxic respiratory failure -follows with pulmonary as an outpatient. Has history of 30 pack year smoking, quit 2016 -Recently increased wheezing and sob, now on  Prednisone -Respiratory status seems improved. Mentation improved -Continue albuterol, pulmicort nebs -Consider sleep study as outpatient  HTN -continue amlodipine, metoprolol -Stable at this time, vitals reviewed  Coronary artery disease status post stenting -No chest pain, troponin flat, slightly elevated probably representing demand ischemia -Stable at this time  Chronic kidney disease stage IIIa -Creatinine appears at baseline  Discharge Instructions   Allergies as of 12/03/2019      Reactions   Iohexol Other (See Comments)   Code:  HIVES, Desc:  PER ROBIN @ [facility], PT IS ALLERGIC TO IVP DYE 08/28/10 RM   Lipitor [atorvastatin Calcium] Hives      Medication List    STOP taking these medications   furosemide 20 MG tablet Commonly known as: Lasix     TAKE these medications   acetaminophen 650 MG CR tablet Commonly known as: Tylenol 8 Hour Take 1 tablet (650 mg total) by mouth every 8 (eight) hours as needed. What changed: reasons to take this   albuterol 108 (90 Base) MCG/ACT inhaler Commonly known as: VENTOLIN HFA Inhale 2 puffs into the lungs every 4 (four) hours as needed for wheezing.  Amitiza 8 MCG capsule Generic drug: lubiprostone Take 8 mcg by mouth every 12 (twelve) hours.   amitriptyline 10 MG tablet Commonly known as: ELAVIL Take 10-20 mg by  mouth at bedtime as needed (depression).   amLODipine 10 MG tablet Commonly known as: NORVASC Take 1 tablet (10 mg total) by mouth daily. Start taking on: December 04, 2019   apixaban 5 MG Tabs tablet Commonly known as: ELIQUIS Take 1 tablet (5 mg total) by mouth 2 (two) times daily.   aspirin 81 MG tablet Take 1 tablet (81 mg total) by mouth daily. With food   Brovana 15 MCG/2ML Nebu Generic drug: arformoterol USE 1 VIAL  IN  NEBULIZER TWICE  DAILY - morning and evening What changed: See the new instructions.   budesonide 0.25 MG/2ML nebulizer solution Commonly known as: PULMICORT USE 1 VIAL  IN  NEBULIZER TWICE  DAILY - rinse mouth after treatment What changed: See the new instructions.   busPIRone 10 MG tablet Commonly known as: BUSPAR Take 1 tablet (10 mg total) by mouth 2 (two) times daily.   cholecalciferol 1000 units tablet Commonly known as: VITAMIN D Take 1,000 Units by mouth daily.   dicyclomine 20 MG tablet Commonly known as: BENTYL Take 1 tablet (20 mg total) by mouth 3 (three) times daily as needed for spasms. Cramping abdominal pain. What changed:   reasons to take this  additional instructions   famotidine 20 MG tablet Commonly known as: PEPCID Take 20 mg by mouth every 12 (twelve) hours as needed for heartburn.   fluticasone 50 MCG/ACT nasal spray Commonly known as: FLONASE Place 2 sprays into both nostrils daily as needed for allergies or rhinitis.   gabapentin 300 MG capsule Commonly known as: NEURONTIN Take 300 mg by mouth every 6 (six) hours as needed for pain.   guaiFENesin 600 MG 12 hr tablet Commonly known as: MUCINEX Take 1 tablet (600 mg total) by mouth 2 (two) times daily.   isosorbide mononitrate 120 MG 24 hr tablet Commonly known as: IMDUR Take 1 tablet (120 mg total) by mouth daily.   nitroGLYCERIN 0.4 MG SL tablet Commonly known as: NITROSTAT Place 0.4 mg under the tongue every 5 (five) minutes as needed for chest pain.    pantoprazole 40 MG tablet Commonly known as: PROTONIX Take 40 mg by mouth daily as needed.   polyethylene glycol 17 g packet Commonly known as: MiraLax Take 17 g by mouth daily as needed for mild constipation or moderate constipation.   pravastatin 40 MG tablet Commonly known as: PRAVACHOL Take 1 tablet (40 mg total) by mouth every evening.   predniSONE 20 MG tablet Commonly known as: DELTASONE Take 2 tablets (40 mg total) by mouth daily with breakfast.   trolamine salicylate 10 % cream Commonly known as: ASPERCREME Apply 1 application topically daily as needed for muscle pain.   Yupelri 175 MCG/3ML Soln Generic drug: Revefenacin USE 1 VIAL IN NEBULIZER DAILY - DO NOT MIX WITH OTHER NEB MEDS,USE BEFORE OR AFTER What changed: See the new instructions.      Follow-up Information    Leonard Downing, MD. Schedule an appointment as soon as possible for a visit in 2 week(s).   Specialty: Family Medicine Contact information: [place] [address] [phone]        Minus Breeding, MD .   Specialty: Cardiology Contact information: [address] [place] [address] [phone]          Allergies  Allergen Reactions  . Iohexol Other (See Comments)    Code:  HIVES, Desc:  PER ROBIN @ [facility], PT IS ALLERGIC TO IVP DYE 08/28/10 RM  . Lipitor [Atorvastatin Calcium] Hives     Procedures/Studies: CT Head Wo Contrast  Result Date: 11/26/2019 CLINICAL DATA:  Ataxia, question stroke EXAM: CT HEAD WITHOUT CONTRAST TECHNIQUE: Contiguous axial images were obtained from the base of the skull through the vertex without intravenous contrast. COMPARISON:  November 24, 2018 FINDINGS: Brain: No evidence of acute territorial infarction, hemorrhage, hydrocephalus,extra-axial collection or mass lesion/mass effect. There is dilatation the ventricles and sulci consistent with age-related atrophy. Low-attenuation changes in the deep white matter  consistent with small vessel ischemia. Prior lacunar infarct in the left basal ganglia. Vascular: No hyperdense vessel or unexpected calcification. Skull: The skull is intact. No fracture or focal lesion identified. Sinuses/Orbits: Again noted is opacification of the bilateral mastoid air cells. The orbits and globes intact. Other: None IMPRESSION: No acute intracranial abnormality. Findings consistent with age related atrophy and chronic small vessel ischemia Small unchanged lacunar infarct in the left basal ganglia. Electronically Signed   By: Prudencio Pair M.D.   On: 11/26/2019 21:12   MR BRAIN WO CONTRAST  Result Date: 11/27/2019 CLINICAL DATA:  Ataxia, stroke suspected. EXAM: MRI HEAD WITHOUT CONTRAST TECHNIQUE: Multiplanar, multiecho pulse sequences of the brain and surrounding structures were obtained without intravenous contrast. COMPARISON:  Head CT 11/26/2019 and MRI 10/31/2004 FINDINGS: The study is motion degraded throughout with multiple sequences being severely degraded. Brain: No acute infarct is identified, however very small infarcts could be obscured by motion. There are chronic infarcts in the left caudate and left putamen which are new from the 2005 MRI. No significant white matter disease is seen for age. There is mild-to-moderate cerebral atrophy. Vascular: Major intracranial vascular flow voids are preserved. Skull and upper cervical spine: No gross skull lesion. Sinuses/Orbits: Unremarkable orbits. Minimal mucosal thickening in the paranasal sinuses. Large bilateral mastoid effusions. Other: None. IMPRESSION: 1. Severely motion degraded examination. 2. No acute infarct identified. 3. Chronic left basal ganglia lacunar infarcts. Electronically Signed   By: Logan Bores M.D.   On: 11/27/2019 14:06   DG Pelvis Portable  Result Date: 11/26/2019 CLINICAL DATA:  Weakness EXAM: PORTABLE PELVIS 1-2 VIEWS COMPARISON:  None. FINDINGS: Pelvic ring is intact. No acute fracture or dislocation is  noted. Postsurgical changes in the lumbar spine are seen. No soft tissue abnormality is noted. IMPRESSION: No acute abnormality seen. Electronically Signed   By: Inez Catalina M.D.   On: 11/26/2019 20:33   DG Chest Port 1 View  Result Date: 11/26/2019 CLINICAL DATA:  Shortness of breath. EXAM: PORTABLE CHEST 1 VIEW COMPARISON:  November 24, 2018 FINDINGS: Mild, chronic-appearing increased interstitial lung markings are seen. This is unchanged in appearance when compared to the prior study. There is no evidence of a pleural effusion or pneumothorax. The cardiac silhouette is moderately enlarged. There is moderate severity calcification of the aortic arch. The visualized skeletal structures are unremarkable. IMPRESSION: 1. Chronic appearing increased interstitial lung markings without evidence of acute or active cardiopulmonary disease. Electronically Signed   By: Virgina Norfolk M.D.   On: 11/26/2019 20:33   ECHOCARDIOGRAM COMPLETE  Result Date: 11/29/2019   ECHOCARDIOGRAM REPORT   Patient Name:   [name] Date of Exam: 11/29/2019 Medical Rec #:  [id number]      Height:       64.0 in Accession #:    [id number]  Weight:       219.1 lb Date of Birth:  [birth year]      BSA:          2.03 m Patient Age:    27 years       BP:           176/73 mmHg Patient Gender: F              HR:           70 bpm. Exam Location:  Inpatient Procedure: 2D Echo, Cardiac Doppler and Color Doppler Indications:    Atrial Fibrillation 427.31  History:        Patient has prior history of Echocardiogram examinations, most                 recent 08/28/2018. CAD, COPD, Arrythmias:non-specific ST                 changes, Signs/Symptoms:Shortness of Breath and Chest Pain; Risk                 Factors:Hypertension, Dyslipidemia and Former Smoker.  Sonographer:    Paulita Fujita RDCS Referring Phys: [place]  1. Left ventricular ejection fraction, by visual estimation, is 55 to 60%. The left ventricle has normal  function. There is no left ventricular hypertrophy.  2. Left ventricular diastolic parameters are consistent with Grade I diastolic dysfunction (impaired relaxation).  3. The left ventricle has no regional wall motion abnormalities.  4. Global right ventricle has normal systolic function.The right ventricular size is normal. No increase in right ventricular wall thickness.  5. Left atrial size was normal.  6. Right atrial size was normal.  7. The mitral valve is normal in structure. No evidence of mitral valve regurgitation. No evidence of mitral stenosis.  8. The tricuspid valve is normal in structure.  9. The aortic valve is normal in structure. Aortic valve regurgitation is not visualized. No evidence of aortic valve sclerosis or stenosis. 10. The pulmonic valve was normal in structure. Pulmonic valve regurgitation is not visualized. 11. The inferior vena cava is normal in size with greater than 50% respiratory variability, suggesting right atrial pressure of 3 mmHg. FINDINGS  Left Ventricle: Left ventricular ejection fraction, by visual estimation, is 55 to 60%. The left ventricle has normal function. The left ventricle has no regional wall motion abnormalities. There is no left ventricular hypertrophy. Left ventricular diastolic parameters are consistent with Grade I diastolic dysfunction (impaired relaxation). Normal left atrial pressure. Right Ventricle: The right ventricular size is normal. No increase in right ventricular wall thickness. Global RV systolic function is has normal systolic function. Left Atrium: Left atrial size was normal in size. Right Atrium: Right atrial size was normal in size Pericardium: There is no evidence of pericardial effusion. Mitral Valve: The mitral valve is normal in structure. No evidence of mitral valve regurgitation. No evidence of mitral valve stenosis by observation. Tricuspid Valve: The tricuspid valve is normal in structure. Tricuspid valve regurgitation is not  demonstrated. Aortic Valve: The aortic valve is normal in structure. Aortic valve regurgitation is not visualized. The aortic valve is structurally normal, with no evidence of sclerosis or stenosis. Pulmonic Valve: The pulmonic valve was normal in structure. Pulmonic valve regurgitation is not visualized. Pulmonic regurgitation is not visualized. Aorta: The aortic root, ascending aorta and aortic arch are all structurally normal, with no evidence of dilitation or obstruction. Venous: The inferior vena cava is normal in size with greater  than 50% respiratory variability, suggesting right atrial pressure of 3 mmHg. IAS/Shunts: No atrial level shunt detected by color flow Doppler. There is no evidence of a patent foramen ovale. No ventricular septal defect is seen or detected. There is no evidence of an atrial septal defect.  LEFT VENTRICLE PLAX 2D LVIDd:         4.40 cm       Diastology LVIDs:         3.30 cm       LV e' lateral:   5.48 cm/s LV PW:         1.10 cm       LV E/e' lateral: 17.0 LV IVS:        1.10 cm       LV e' medial:    4.80 cm/s LV SV:         44 ml         LV E/e' medial:  19.4 LV SV Index:   20.11  LV Volumes (MOD) LV area d, A2C:    32.30 cm LV area d, A4C:    34.00 cm LV area s, A2C:    19.10 cm LV area s, A4C:    19.40 cm LV major d, A2C:   8.22 cm LV major d, A4C:   8.11 cm LV major s, A2C:   6.83 cm LV major s, A4C:   6.68 cm LV vol d, MOD A2C: 109.0 ml LV vol d, MOD A4C: 117.0 ml LV vol s, MOD A2C: 46.7 ml LV vol s, MOD A4C: 47.2 ml LV SV MOD A2C:     62.3 ml LV SV MOD A4C:     117.0 ml LV SV MOD BP:      65.3 ml RIGHT VENTRICLE RV S prime:     14.90 cm/s TAPSE (M-mode): 2.4 cm LEFT ATRIUM           Index       RIGHT ATRIUM           Index LA diam:      4.30 cm 2.11 cm/m  RA Area:     13.00 cm LA Vol (A2C): 34.8 ml 17.11 ml/m RA Volume:   26.50 ml  13.03 ml/m LA Vol (A4C): 57.7 ml 28.37 ml/m  AORTIC VALVE LVOT Vmax:   159.00 cm/s LVOT Vmean:  113.000 cm/s LVOT VTI:    0.352 m  AORTA  Ao Root diam: 2.90 cm MITRAL VALVE MV Area (PHT): 2.56 cm             SHUNTS MV PHT:        85.84 msec           Systemic VTI: 0.35 m MV Decel Time: 296 msec MV E velocity: 93.00 cm/s 103 cm/s MV A velocity: 96.30 cm/s 70.3 cm/s MV E/A ratio:  0.97       1.5  Candee Furbish MD Electronically signed by Candee Furbish MD Signature Date/Time: 11/29/2019/2:32:21 PM    Final      Subjective: Without complaints this AM  Discharge Exam: Vitals:   12/03/19 1018 12/03/19 1023  BP:    Pulse:    Resp:    Temp:    SpO2: 93% 93%   Vitals:   12/02/19 2048 12/03/19 0424 12/03/19 1018 12/03/19 1023  BP:  (!) 148/77    Pulse: 60 (!) 58    Resp:  20    Temp:  (!) 97.5 F (36.4 C)  TempSrc:  Oral    SpO2: 94% 95% 93% 93%  Weight:  98.2 kg    Height:        General: Pt is alert, awake, not in acute distress Cardiovascular: RRR, S1/S2 +, no rubs, no gallops Respiratory: CTA bilaterally, no wheezing, no rhonchi Abdominal: Soft, NT, ND, bowel sounds + Extremities: no edema, no cyanosis   The results of significant diagnostics from this hospitalization (including imaging, microbiology, ancillary and laboratory) are listed below for reference.     Microbiology: Recent Results (from the past 240 hour(s))  Respiratory Panel by RT PCR (Flu A&B, Covid) - Nasopharyngeal Swab     Status: None   Collection Time: 11/26/19  9:38 PM   Specimen: Nasopharyngeal Swab  Result Value Ref Range Status   SARS Coronavirus 2 by RT PCR NEGATIVE NEGATIVE Final    Comment: (NOTE) SARS-CoV-2 target nucleic acids are NOT DETECTED. The SARS-CoV-2 RNA is generally detectable in upper respiratoy specimens during the acute phase of infection. The lowest concentration of SARS-CoV-2 viral copies this assay can detect is 131 copies/mL. A negative result does not preclude SARS-Cov-2 infection and should not be used as the sole basis for treatment or other patient management decisions. A negative result may occur with   improper specimen collection/handling, submission of specimen other than nasopharyngeal swab, presence of viral mutation(s) within the areas targeted by this assay, and inadequate number of viral copies (<131 copies/mL). A negative result must be combined with clinical observations, patient history, and epidemiological information. The expected result is Negative. Fact Sheet for Patients:  PinkCheek.be Fact Sheet for Healthcare Providers:  GravelBags.it This test is not yet ap proved or cleared by the [place] FDA and  has been authorized for detection and/or diagnosis of SARS-CoV-2 by FDA under an Emergency Use Authorization (EUA). This EUA will remain  in effect (meaning this test can be used) for the duration of the COVID-19 declaration under Section 564(b)(1) of the Act, 21 U.S.C. section 360bbb-3(b)(1), unless the authorization is terminated or revoked sooner.    Influenza A by PCR NEGATIVE NEGATIVE Final   Influenza B by PCR NEGATIVE NEGATIVE Final    Comment: (NOTE) The Xpert Xpress SARS-CoV-2/FLU/RSV assay is intended as an aid in  the diagnosis of influenza from Nasopharyngeal swab specimens and  should not be used as a sole basis for treatment. Nasal washings and  aspirates are unacceptable for Xpert Xpress SARS-CoV-2/FLU/RSV  testing. Fact Sheet for Patients: PinkCheek.be Fact Sheet for Healthcare Providers: GravelBags.it This test is not yet approved or cleared by the [place] FDA and  has been authorized for detection and/or diagnosis of SARS-CoV-2 by  FDA under an Emergency Use Authorization (EUA). This EUA will remain  in effect (meaning this test can be used) for the duration of the  Covid-19 declaration under Section 564(b)(1) of the Act, 21  U.S.C. section 360bbb-3(b)(1), unless the authorization is  terminated or revoked. Performed at  [facility], [place] [address] [place], [place] [address]   SARS CORONAVIRUS 2 (TAT 6-24 HRS) Nasopharyngeal Nasopharyngeal Swab     Status: None   Collection Time: 11/29/19  9:24 PM   Specimen: Nasopharyngeal Swab  Result Value Ref Range Status   SARS Coronavirus 2 NEGATIVE NEGATIVE Final    Comment: (NOTE) SARS-CoV-2 target nucleic acids are NOT DETECTED. The SARS-CoV-2 RNA is generally detectable in upper and lower respiratory specimens during the acute phase of infection. Negative results do not preclude SARS-CoV-2 infection, do not  rule out co-infections with other pathogens, and should not be used as the sole basis for treatment or other patient management decisions. Negative results must be combined with clinical observations, patient history, and epidemiological information. The expected result is Negative. Fact Sheet for Patients: SugarRoll.be Fact Sheet for Healthcare Providers: https://www.woods-mathews.com/ This test is not yet approved or cleared by the [place] FDA and  has been authorized for detection and/or diagnosis of SARS-CoV-2 by FDA under an Emergency Use Authorization (EUA). This EUA will remain  in effect (meaning this test can be used) for the duration of the COVID-19 declaration under Section 56 4(b)(1) of the Act, 21 U.S.C. section 360bbb-3(b)(1), unless the authorization is terminated or revoked sooner. Performed at [facility], [place] [address], [place], [place] [address]   SARS CORONAVIRUS 2 (TAT 6-24 HRS) Nasopharyngeal Nasopharyngeal Swab     Status: None   Collection Time: 12/02/19  3:04 PM   Specimen: Nasopharyngeal Swab  Result Value Ref Range Status   SARS Coronavirus 2 NEGATIVE NEGATIVE Final    Comment: (NOTE) SARS-CoV-2 target nucleic acids are NOT DETECTED. The SARS-CoV-2 RNA is generally detectable in upper and lower respiratory specimens during the acute phase of  infection. Negative results do not preclude SARS-CoV-2 infection, do not rule out co-infections with other pathogens, and should not be used as the sole basis for treatment or other patient management decisions. Negative results must be combined with clinical observations, patient history, and epidemiological information. The expected result is Negative. Fact Sheet for Patients: SugarRoll.be Fact Sheet for Healthcare Providers: https://www.woods-mathews.com/ This test is not yet approved or cleared by the [place] FDA and  has been authorized for detection and/or diagnosis of SARS-CoV-2 by FDA under an Emergency Use Authorization (EUA). This EUA will remain  in effect (meaning this test can be used) for the duration of the COVID-19 declaration under Section 56 4(b)(1) of the Act, 21 U.S.C. section 360bbb-3(b)(1), unless the authorization is terminated or revoked sooner. Performed at [facility], [place] [address] [place], [place] [address]      Labs: BNP (last 3 results) No results for input(s): BNP in the last 8760 hours. Basic Metabolic Panel: Recent Labs  Lab 11/28/19 0512 11/29/19 0454 12/01/19 0521 12/02/19 0423 12/03/19 0431  NA 144 144 142 140 139  K 3.3* 3.7 5.3* 5.2* 5.0  CL 101 100 101 96* 98  CO2 31 32 35* 38* 34*  GLUCOSE 131* 139* 147* 202* 174*  BUN 22 23 25* 33* 34*  CREATININE 1.06* 1.28* 1.10* 1.17* 1.19*  CALCIUM 9.0 8.7* 9.2 9.0 9.1   Liver Function Tests: Recent Labs  Lab 11/26/19 1846 11/28/19 0512 12/02/19 0423 12/03/19 0431  AST 20 19 23 16   ALT 26 19 22 24   ALKPHOS 74 57 49 54  BILITOT 0.7 0.5 0.6 0.5  PROT 7.3 6.1* 6.3* 6.3*  ALBUMIN 4.0 3.4* 3.5 3.5   Recent Labs  Lab 11/26/19 1846  LIPASE 24   No results for input(s): AMMONIA in the last 168 hours. CBC: Recent Labs  Lab 11/26/19 1846 11/27/19 0516 11/28/19 0512 12/01/19 0521 12/02/19 0423  WBC 7.9 8.9 7.9 10.1 11.4*   HGB 14.5 13.9 13.6 12.8 12.6  HCT 47.7* 45.9 44.3 42.9 42.0  MCV 99.8 100.4* 100.0 102.1* 103.2*  PLT 256 230 213 213 191   Cardiac Enzymes: Recent Labs  Lab 11/27/19 0516  CKTOTAL 285*   BNP: Invalid input(s): POCBNP CBG: Recent Labs  Lab 12/01/19 [phone]  GLUCAP 128*   D-Dimer No results for input(s): DDIMER in the last 72 hours. Hgb A1c No results for input(s): HGBA1C in the last 72 hours. Lipid Profile No results for input(s): CHOL, HDL, LDLCALC, TRIG, CHOLHDL, LDLDIRECT in the last 72 hours. Thyroid function studies No results for input(s): TSH, T4TOTAL, T3FREE, THYROIDAB in the last 72 hours.  Invalid input(s): FREET3 Anemia work up No results for input(s): VITAMINB12, FOLATE, FERRITIN, TIBC, IRON, RETICCTPCT in the last 72 hours. Urinalysis    Component Value Date/Time   COLORURINE YELLOW 11/26/2019 2031   APPEARANCEUR CLEAR 11/26/2019 2031   LABSPEC 1.013 11/26/2019 2031   PHURINE 8.0 11/26/2019 2031   GLUCOSEU NEGATIVE 11/26/2019 2031   HGBUR NEGATIVE 11/26/2019 2031   BILIRUBINUR NEGATIVE 11/26/2019 2031   KETONESUR 20 (A) 11/26/2019 2031   PROTEINUR 30 (A) 11/26/2019 2031   UROBILINOGEN 0.2 11/28/2014 1557   NITRITE NEGATIVE 11/26/2019 2031   LEUKOCYTESUR NEGATIVE 11/26/2019 2031   Sepsis Labs Invalid input(s): PROCALCITONIN,  WBC,  LA[facility] Microbiology Recent Results (from the past 240 hour(s))  Respiratory Panel by RT PCR (Flu A&B, Covid) - Nasopharyngeal Swab     Status: None   Collection Time: 11/26/19  9:38 PM   Specimen: Nasopharyngeal Swab  Result Value Ref Range Status   SARS Coronavirus 2 by RT PCR NEGATIVE NEGATIVE Final    Comment: (NOTE) SARS-CoV-2 target nucleic acids are NOT DETECTED. The SARS-CoV-2 RNA is generally detectable in upper respiratoy specimens during the acute phase of infection. The lowest concentration of SARS-CoV-2 viral copies this assay can detect is 131 copies/mL. A negative result does not preclude  SARS-Cov-2 infection and should not be used as the sole basis for treatment or other patient management decisions. A negative result may occur with  improper specimen collection/handling, submission of specimen other than nasopharyngeal swab, presence of viral mutation(s) within the areas targeted by this assay, and inadequate number of viral copies (<131 copies/mL). A negative result must be combined with clinical observations, patient history, and epidemiological information. The expected result is Negative. Fact Sheet for Patients:  PinkCheek.be Fact Sheet for Healthcare Providers:  GravelBags.it This test is not yet ap proved or cleared by the [place] FDA and  has been authorized for detection and/or diagnosis of SARS-CoV-2 by FDA under an Emergency Use Authorization (EUA). This EUA will remain  in effect (meaning this test can be used) for the duration of the COVID-19 declaration under Section 564(b)(1) of the Act, 21 U.S.C. section 360bbb-3(b)(1), unless the authorization is terminated or revoked sooner.    Influenza A by PCR NEGATIVE NEGATIVE Final   Influenza B by PCR NEGATIVE NEGATIVE Final    Comment: (NOTE) The Xpert Xpress SARS-CoV-2/FLU/RSV assay is intended as an aid in  the diagnosis of influenza from Nasopharyngeal swab specimens and  should not be used as a sole basis for treatment. Nasal washings and  aspirates are unacceptable for Xpert Xpress SARS-CoV-2/FLU/RSV  testing. Fact Sheet for Patients: PinkCheek.be Fact Sheet for Healthcare Providers: GravelBags.it This test is not yet approved or cleared by the [place] FDA and  has been authorized for detection and/or diagnosis of SARS-CoV-2 by  FDA under an Emergency Use Authorization (EUA). This EUA will remain  in effect (meaning this test can be used) for the duration of the  Covid-19  declaration under Section 564(b)(1) of the Act, 21  U.S.C. section 360bbb-3(b)(1), unless the authorization is  terminated or revoked. Performed at [facility], [address]  [place], [place] [address]   SARS CORONAVIRUS 2 (TAT 6-24 HRS) Nasopharyngeal Nasopharyngeal Swab     Status: None   Collection Time: 11/29/19  9:24 PM   Specimen: Nasopharyngeal Swab  Result Value Ref Range Status   SARS Coronavirus 2 NEGATIVE NEGATIVE Final    Comment: (NOTE) SARS-CoV-2 target nucleic acids are NOT DETECTED. The SARS-CoV-2 RNA is generally detectable in upper and lower respiratory specimens during the acute phase of infection. Negative results do not preclude SARS-CoV-2 infection, do not rule out co-infections with other pathogens, and should not be used as the sole basis for treatment or other patient management decisions. Negative results must be combined with clinical observations, patient history, and epidemiological information. The expected result is Negative. Fact Sheet for Patients: SugarRoll.be Fact Sheet for Healthcare Providers: https://www.woods-mathews.com/ This test is not yet approved or cleared by the [place] FDA and  has been authorized for detection and/or diagnosis of SARS-CoV-2 by FDA under an Emergency Use Authorization (EUA). This EUA will remain  in effect (meaning this test can be used) for the duration of the COVID-19 declaration under Section 56 4(b)(1) of the Act, 21 U.S.C. section 360bbb-3(b)(1), unless the authorization is terminated or revoked sooner. Performed at [facility], [place] [address] [place], [place] [address]   SARS CORONAVIRUS 2 (TAT 6-24 HRS) Nasopharyngeal Nasopharyngeal Swab     Status: None   Collection Time: 12/02/19  3:04 PM   Specimen: Nasopharyngeal Swab  Result Value Ref Range Status   SARS Coronavirus 2 NEGATIVE NEGATIVE Final    Comment: (NOTE) SARS-CoV-2  target nucleic acids are NOT DETECTED. The SARS-CoV-2 RNA is generally detectable in upper and lower respiratory specimens during the acute phase of infection. Negative results do not preclude SARS-CoV-2 infection, do not rule out co-infections with other pathogens, and should not be used as the sole basis for treatment or other patient management decisions. Negative results must be combined with clinical observations, patient history, and epidemiological information. The expected result is Negative. Fact Sheet for Patients: SugarRoll.be Fact Sheet for Healthcare Providers: https://www.woods-mathews.com/ This test is not yet approved or cleared by the [place] FDA and  has been authorized for detection and/or diagnosis of SARS-CoV-2 by FDA under an Emergency Use Authorization (EUA). This EUA will remain  in effect (meaning this test can be used) for the duration of the COVID-19 declaration under Section 56 4(b)(1) of the Act, 21 U.S.C. section 360bbb-3(b)(1), unless the authorization is terminated or revoked sooner. Performed at [facility], [place] [address] [place], [place] [address]    Time spent: 30 min  SIGNED:   Marylu Lund, MD  [facility] 12/03/2019, 11:38 AM  If 7PM-7AM, please contact night-coverage

## 2019-12-12 ENCOUNTER — Emergency Department: Payer: Medicare Other

## 2019-12-12 ENCOUNTER — Encounter (HOSPITAL_COMMUNITY): Payer: Self-pay | Admitting: Pulmonary Disease

## 2019-12-12 ENCOUNTER — Emergency Department
Admission: EM | Admit: 2019-12-12 | Discharge: 2019-12-12 | Disposition: A | Discharge status: Other Acute Inpatient Hospital | Payer: Medicare Other | Attending: Emergency Medicine | Admitting: Emergency Medicine

## 2019-12-12 ENCOUNTER — Other Ambulatory Visit: Payer: Self-pay

## 2019-12-12 ENCOUNTER — Inpatient Hospital Stay (HOSPITAL_COMMUNITY)
Admission: AD | Admit: 2019-12-12 | Discharge: 2020-01-17 | DRG: 870 | Disposition: E | Payer: Medicare Other | Source: Other Acute Inpatient Hospital | Attending: Pulmonary Disease | Admitting: Pulmonary Disease

## 2019-12-12 DIAGNOSIS — Z452 Encounter for adjustment and management of vascular access device: Secondary | ICD-10-CM

## 2019-12-12 DIAGNOSIS — E1122 Type 2 diabetes mellitus with diabetic chronic kidney disease: Secondary | ICD-10-CM | POA: Diagnosis present

## 2019-12-12 DIAGNOSIS — U071 COVID-19: Secondary | ICD-10-CM | POA: Diagnosis present

## 2019-12-12 DIAGNOSIS — J440 Chronic obstructive pulmonary disease with acute lower respiratory infection: Secondary | ICD-10-CM | POA: Diagnosis present

## 2019-12-12 DIAGNOSIS — N179 Acute kidney failure, unspecified: Secondary | ICD-10-CM | POA: Diagnosis present

## 2019-12-12 DIAGNOSIS — I5033 Acute on chronic diastolic (congestive) heart failure: Secondary | ICD-10-CM | POA: Diagnosis present

## 2019-12-12 DIAGNOSIS — Z83511 Family history of glaucoma: Secondary | ICD-10-CM | POA: Diagnosis not present

## 2019-12-12 DIAGNOSIS — Z9861 Coronary angioplasty status: Secondary | ICD-10-CM | POA: Diagnosis not present

## 2019-12-12 DIAGNOSIS — I4819 Other persistent atrial fibrillation: Secondary | ICD-10-CM | POA: Diagnosis present

## 2019-12-12 DIAGNOSIS — G9341 Metabolic encephalopathy: Secondary | ICD-10-CM | POA: Diagnosis present

## 2019-12-12 DIAGNOSIS — K5900 Constipation, unspecified: Secondary | ICD-10-CM | POA: Diagnosis not present

## 2019-12-12 DIAGNOSIS — I110 Hypertensive heart disease with heart failure: Secondary | ICD-10-CM | POA: Diagnosis not present

## 2019-12-12 DIAGNOSIS — E785 Hyperlipidemia, unspecified: Secondary | ICD-10-CM | POA: Diagnosis present

## 2019-12-12 DIAGNOSIS — E1165 Type 2 diabetes mellitus with hyperglycemia: Secondary | ICD-10-CM | POA: Diagnosis not present

## 2019-12-12 DIAGNOSIS — Z66 Do not resuscitate: Secondary | ICD-10-CM | POA: Diagnosis present

## 2019-12-12 DIAGNOSIS — J1282 Pneumonia due to coronavirus disease 2019: Secondary | ICD-10-CM | POA: Insufficient documentation

## 2019-12-12 DIAGNOSIS — Z85828 Personal history of other malignant neoplasm of skin: Secondary | ICD-10-CM | POA: Diagnosis not present

## 2019-12-12 DIAGNOSIS — Y95 Nosocomial condition: Secondary | ICD-10-CM | POA: Diagnosis not present

## 2019-12-12 DIAGNOSIS — J80 Acute respiratory distress syndrome: Secondary | ICD-10-CM | POA: Diagnosis present

## 2019-12-12 DIAGNOSIS — I130 Hypertensive heart and chronic kidney disease with heart failure and stage 1 through stage 4 chronic kidney disease, or unspecified chronic kidney disease: Secondary | ICD-10-CM | POA: Diagnosis present

## 2019-12-12 DIAGNOSIS — Z888 Allergy status to other drugs, medicaments and biological substances status: Secondary | ICD-10-CM

## 2019-12-12 DIAGNOSIS — R0902 Hypoxemia: Secondary | ICD-10-CM

## 2019-12-12 DIAGNOSIS — Z79899 Other long term (current) drug therapy: Secondary | ICD-10-CM | POA: Insufficient documentation

## 2019-12-12 DIAGNOSIS — M1990 Unspecified osteoarthritis, unspecified site: Secondary | ICD-10-CM | POA: Diagnosis present

## 2019-12-12 DIAGNOSIS — Z7951 Long term (current) use of inhaled steroids: Secondary | ICD-10-CM

## 2019-12-12 DIAGNOSIS — E870 Hyperosmolality and hypernatremia: Secondary | ICD-10-CM | POA: Diagnosis not present

## 2019-12-12 DIAGNOSIS — K219 Gastro-esophageal reflux disease without esophagitis: Secondary | ICD-10-CM | POA: Diagnosis present

## 2019-12-12 DIAGNOSIS — Z7952 Long term (current) use of systemic steroids: Secondary | ICD-10-CM

## 2019-12-12 DIAGNOSIS — D6959 Other secondary thrombocytopenia: Secondary | ICD-10-CM | POA: Diagnosis present

## 2019-12-12 DIAGNOSIS — Z515 Encounter for palliative care: Secondary | ICD-10-CM | POA: Diagnosis not present

## 2019-12-12 DIAGNOSIS — G9340 Encephalopathy, unspecified: Secondary | ICD-10-CM | POA: Diagnosis not present

## 2019-12-12 DIAGNOSIS — Z809 Family history of malignant neoplasm, unspecified: Secondary | ICD-10-CM | POA: Diagnosis not present

## 2019-12-12 DIAGNOSIS — A4189 Other specified sepsis: Secondary | ICD-10-CM | POA: Diagnosis present

## 2019-12-12 DIAGNOSIS — J9600 Acute respiratory failure, unspecified whether with hypoxia or hypercapnia: Secondary | ICD-10-CM

## 2019-12-12 DIAGNOSIS — J15211 Pneumonia due to Methicillin susceptible Staphylococcus aureus: Secondary | ICD-10-CM | POA: Diagnosis not present

## 2019-12-12 DIAGNOSIS — I2510 Atherosclerotic heart disease of native coronary artery without angina pectoris: Secondary | ICD-10-CM | POA: Diagnosis present

## 2019-12-12 DIAGNOSIS — Z8249 Family history of ischemic heart disease and other diseases of the circulatory system: Secondary | ICD-10-CM

## 2019-12-12 DIAGNOSIS — N1831 Chronic kidney disease, stage 3a: Secondary | ICD-10-CM | POA: Diagnosis present

## 2019-12-12 DIAGNOSIS — Z87891 Personal history of nicotine dependence: Secondary | ICD-10-CM | POA: Diagnosis not present

## 2019-12-12 DIAGNOSIS — J449 Chronic obstructive pulmonary disease, unspecified: Secondary | ICD-10-CM | POA: Diagnosis not present

## 2019-12-12 DIAGNOSIS — R4182 Altered mental status, unspecified: Secondary | ICD-10-CM | POA: Diagnosis not present

## 2019-12-12 DIAGNOSIS — J9601 Acute respiratory failure with hypoxia: Secondary | ICD-10-CM | POA: Diagnosis not present

## 2019-12-12 DIAGNOSIS — Z7982 Long term (current) use of aspirin: Secondary | ICD-10-CM

## 2019-12-12 DIAGNOSIS — Z91041 Radiographic dye allergy status: Secondary | ICD-10-CM

## 2019-12-12 DIAGNOSIS — E11649 Type 2 diabetes mellitus with hypoglycemia without coma: Secondary | ICD-10-CM | POA: Diagnosis present

## 2019-12-12 DIAGNOSIS — Z7901 Long term (current) use of anticoagulants: Secondary | ICD-10-CM

## 2019-12-12 DIAGNOSIS — Z8673 Personal history of transient ischemic attack (TIA), and cerebral infarction without residual deficits: Secondary | ICD-10-CM

## 2019-12-12 DIAGNOSIS — E875 Hyperkalemia: Secondary | ICD-10-CM | POA: Diagnosis not present

## 2019-12-12 DIAGNOSIS — Z9981 Dependence on supplemental oxygen: Secondary | ICD-10-CM

## 2019-12-12 DIAGNOSIS — G253 Myoclonus: Secondary | ICD-10-CM | POA: Diagnosis not present

## 2019-12-12 LAB — COMPREHENSIVE METABOLIC PANEL
ALT: 25 U/L (ref 0–44)
ALT: 28 U/L (ref 0–44)
AST: 25 U/L (ref 15–41)
AST: 29 U/L (ref 15–41)
Albumin: 3.1 g/dL — ABNORMAL LOW (ref 3.5–5.0)
Albumin: 3.1 g/dL — ABNORMAL LOW (ref 3.5–5.0)
Alkaline Phosphatase: 49 U/L (ref 38–126)
Alkaline Phosphatase: 48 U/L (ref 38–126)
Anion gap: 11 (ref 5–15)
Anion gap: 12 (ref 5–15)
BUN: 32 mg/dL — ABNORMAL HIGH (ref 8–23)
BUN: 34 mg/dL — ABNORMAL HIGH (ref 8–23)
CO2: 33 mmol/L — ABNORMAL HIGH (ref 22–32)
CO2: 26 mmol/L (ref 22–32)
Calcium: 8.4 mg/dL — ABNORMAL LOW (ref 8.9–10.3)
Calcium: 8 mg/dL — ABNORMAL LOW (ref 8.9–10.3)
Chloride: 98 mmol/L (ref 98–111)
Chloride: 103 mmol/L (ref 98–111)
Creatinine, Ser: 1.51 mg/dL — ABNORMAL HIGH (ref 0.44–1.00)
Creatinine, Ser: 1.42 mg/dL — ABNORMAL HIGH (ref 0.44–1.00)
GFR calc Af Amer: 37 mL/min — ABNORMAL LOW (ref >60)
GFR calc Af Amer: 40 mL/min — ABNORMAL LOW (ref >60)
GFR calc non Af Amer: 32 mL/min — ABNORMAL LOW (ref >60)
GFR calc non Af Amer: 34 mL/min — ABNORMAL LOW (ref >60)
Glucose, Bld: 161 mg/dL — ABNORMAL HIGH (ref 70–99)
Glucose, Bld: 205 mg/dL — ABNORMAL HIGH (ref 70–99)
Potassium: 5.2 mmol/L — ABNORMAL HIGH (ref 3.5–5.1)
Potassium: 4.7 mmol/L (ref 3.5–5.1)
Sodium: 142 mmol/L (ref 135–145)
Sodium: 141 mmol/L (ref 135–145)
Total Bilirubin: 0.9 mg/dL (ref 0.3–1.2)
Total Bilirubin: 1 mg/dL (ref 0.3–1.2)
Total Protein: 6.2 g/dL — ABNORMAL LOW (ref 6.5–8.1)
Total Protein: 6 g/dL — ABNORMAL LOW (ref 6.5–8.1)

## 2019-12-12 LAB — BLOOD GAS, VENOUS
Acid-Base Excess: 9.6 mmol/L — ABNORMAL HIGH (ref 0.0–2.0)
Bicarbonate: 38.5 mmol/L — ABNORMAL HIGH (ref 20.0–28.0)
FIO2: 1
MECHVT: 450 mL
Mechanical Rate: 20
O2 Saturation: 64.2 %
PEEP: 5 cmH2O
Patient temperature: 37
pCO2, Ven: 73 mmHg (ref 44.0–60.0)
pH, Ven: 7.33 (ref 7.250–7.430)
pO2, Ven: 36 mmHg (ref 32.0–45.0)

## 2019-12-12 LAB — POCT I-STAT 7, (LYTES, BLD GAS, ICA,H+H)
Bicarbonate: 25.3 mmol/L (ref 20.0–28.0)
Calcium, Ion: 1.15 mmol/L (ref 1.15–1.40)
HCT: 38 % (ref 36.0–46.0)
Hemoglobin: 12.9 g/dL (ref 12.0–15.0)
O2 Saturation: 96 %
Patient temperature: 98.3
Potassium: 4.6 mmol/L (ref 3.5–5.1)
Sodium: 140 mmol/L (ref 135–145)
TCO2: 27 mmol/L (ref 22–32)
pCO2 arterial: 43.4 mmHg (ref 32.0–48.0)
pH, Arterial: 7.373 (ref 7.350–7.450)
pO2, Arterial: 86 mmHg (ref 83.0–108.0)

## 2019-12-12 LAB — CBC WITH DIFFERENTIAL/PLATELET
Abs Immature Granulocytes: 0.07 10*3/uL (ref 0.00–0.07)
Abs Immature Granulocytes: 0.08 10*3/uL — ABNORMAL HIGH (ref 0.00–0.07)
Basophils Absolute: 0 10*3/uL (ref 0.0–0.1)
Basophils Absolute: 0 10*3/uL (ref 0.0–0.1)
Basophils Relative: 0 %
Basophils Relative: 0 %
Eosinophils Absolute: 0 10*3/uL (ref 0.0–0.5)
Eosinophils Absolute: 0 10*3/uL (ref 0.0–0.5)
Eosinophils Relative: 0 %
Eosinophils Relative: 0 %
HCT: 42.5 % (ref 36.0–46.0)
HCT: 44.9 % (ref 36.0–46.0)
Hemoglobin: 12.6 g/dL (ref 12.0–15.0)
Hemoglobin: 13 g/dL (ref 12.0–15.0)
Immature Granulocytes: 1 %
Immature Granulocytes: 1 %
Lymphocytes Relative: 2 %
Lymphocytes Relative: 4 %
Lymphs Abs: 0.2 10*3/uL — ABNORMAL LOW (ref 0.7–4.0)
Lymphs Abs: 0.3 10*3/uL — ABNORMAL LOW (ref 0.7–4.0)
MCH: 30.8 pg (ref 26.0–34.0)
MCH: 30 pg (ref 26.0–34.0)
MCHC: 29.6 g/dL — ABNORMAL LOW (ref 30.0–36.0)
MCHC: 29 g/dL — ABNORMAL LOW (ref 30.0–36.0)
MCV: 103.9 fL — ABNORMAL HIGH (ref 80.0–100.0)
MCV: 103.7 fL — ABNORMAL HIGH (ref 80.0–100.0)
Monocytes Absolute: 0.5 10*3/uL (ref 0.1–1.0)
Monocytes Absolute: 0.4 10*3/uL (ref 0.1–1.0)
Monocytes Relative: 5 %
Monocytes Relative: 4 %
Neutro Abs: 9.1 10*3/uL — ABNORMAL HIGH (ref 1.7–7.7)
Neutro Abs: 8 10*3/uL — ABNORMAL HIGH (ref 1.7–7.7)
Neutrophils Relative %: 92 %
Neutrophils Relative %: 91 %
Platelets: 173 10*3/uL (ref 150–400)
Platelets: 158 10*3/uL (ref 150–400)
RBC: 4.09 MIL/uL (ref 3.87–5.11)
RBC: 4.33 MIL/uL (ref 3.87–5.11)
RDW: 12.9 % (ref 11.5–15.5)
RDW: 12.8 % (ref 11.5–15.5)
WBC: 9.9 10*3/uL (ref 4.0–10.5)
WBC: 8.8 10*3/uL (ref 4.0–10.5)
nRBC: 0.3 % — ABNORMAL HIGH (ref 0.0–0.2)
nRBC: 0 % (ref 0.0–0.2)

## 2019-12-12 LAB — HEMOGLOBIN A1C
Hgb A1c MFr Bld: 8 % — ABNORMAL HIGH (ref 4.8–5.6)
Mean Plasma Glucose: 182.9 mg/dL

## 2019-12-12 LAB — TROPONIN I (HIGH SENSITIVITY): Troponin I (High Sensitivity): 46 ng/L — ABNORMAL HIGH (ref <18)

## 2019-12-12 LAB — URINALYSIS, ROUTINE W REFLEX MICROSCOPIC
Bilirubin Urine: NEGATIVE
Glucose, UA: 50 mg/dL — AB
Hgb urine dipstick: NEGATIVE
Ketones, ur: 5 mg/dL — AB
Leukocytes,Ua: NEGATIVE
Nitrite: NEGATIVE
Protein, ur: 30 mg/dL — AB
Specific Gravity, Urine: 1.017 (ref 1.005–1.030)
pH: 5 (ref 5.0–8.0)

## 2019-12-12 LAB — RESPIRATORY PANEL BY RT PCR (FLU A&B, COVID)
Influenza A by PCR: NEGATIVE
Influenza B by PCR: NEGATIVE
SARS Coronavirus 2 by RT PCR: POSITIVE — AB

## 2019-12-12 LAB — MAGNESIUM: Magnesium: 1.9 mg/dL (ref 1.7–2.4)

## 2019-12-12 LAB — C-REACTIVE PROTEIN: CRP: 19.7 mg/dL — ABNORMAL HIGH (ref <1.0)

## 2019-12-12 LAB — GLUCOSE, CAPILLARY
Glucose-Capillary: 215 mg/dL — ABNORMAL HIGH (ref 70–99)
Glucose-Capillary: 238 mg/dL — ABNORMAL HIGH (ref 70–99)
Glucose-Capillary: 215 mg/dL — ABNORMAL HIGH (ref 70–99)
Glucose-Capillary: 197 mg/dL — ABNORMAL HIGH (ref 70–99)

## 2019-12-12 LAB — D-DIMER, QUANTITATIVE: D-Dimer, Quant: 0.48 ug/mL-FEU (ref 0.00–0.50)

## 2019-12-12 LAB — POC SARS CORONAVIRUS 2 AG: SARS Coronavirus 2 Ag: POSITIVE — AB

## 2019-12-12 LAB — PROTIME-INR
INR: 1.4 — ABNORMAL HIGH (ref 0.8–1.2)
Prothrombin Time: 17.4 seconds — ABNORMAL HIGH (ref 11.4–15.2)

## 2019-12-12 LAB — LACTATE DEHYDROGENASE: LDH: 280 U/L — ABNORMAL HIGH (ref 98–192)

## 2019-12-12 LAB — LACTIC ACID, PLASMA
Lactic Acid, Venous: 2.5 mmol/L (ref 0.5–1.9)
Lactic Acid, Venous: 1.1 mmol/L (ref 0.5–1.9)

## 2019-12-12 LAB — APTT: aPTT: 39 seconds — ABNORMAL HIGH (ref 24–36)

## 2019-12-12 LAB — PHOSPHORUS: Phosphorus: 2.2 mg/dL — ABNORMAL LOW (ref 2.5–4.6)

## 2019-12-12 MED ORDER — NOREPINEPHRINE 4 MG/250ML-% IV SOLN
0.0000 ug/min | INTRAVENOUS | Status: DC
  Administered 2019-12-12: 10 ug/min via INTRAVENOUS

## 2019-12-12 MED ORDER — APIXABAN 2.5 MG PO TABS
2.5000 mg | ORAL_TABLET | Freq: Two times a day (BID) | ORAL | Status: DC
  Administered 2019-12-12 – 2019-12-13 (×3): 2.5 mg
  Filled 2019-12-12 (×4): qty 1

## 2019-12-12 MED ORDER — ACETAMINOPHEN 325 MG PO TABS
650.0000 mg | ORAL_TABLET | ORAL | Status: DC | PRN
  Administered 2019-12-13 – 2019-12-14 (×3): 650 mg via ORAL
  Filled 2019-12-12 (×3): qty 2

## 2019-12-12 MED ORDER — HEPARIN SODIUM (PORCINE) 10000 UNIT/ML IJ SOLN: 10000.0000 [IU] | Freq: Three times a day (TID) | INTRAMUSCULAR | Status: DC

## 2019-12-12 MED ORDER — PROPOFOL 1000 MG/100ML IV EMUL
5.0000 ug/kg/min | INTRAVENOUS | Status: DC
  Administered 2019-12-12: 40 ug/kg/min via INTRAVENOUS
  Filled 2019-12-12: qty 100

## 2019-12-12 MED ORDER — CHLORHEXIDINE GLUCONATE 0.12% ORAL RINSE (MEDLINE KIT)
15.0000 mL | Freq: Two times a day (BID) | OROMUCOSAL | Status: DC
  Administered 2019-12-12 – 2019-12-20 (×16): 15 mL via OROMUCOSAL

## 2019-12-12 MED ORDER — LACTATED RINGERS IV SOLN: INTRAVENOUS | Status: DC

## 2019-12-12 MED ORDER — ORAL CARE MOUTH RINSE
15.0000 mL | OROMUCOSAL | Status: DC
  Administered 2019-12-12 – 2019-12-22 (×98): 15 mL via OROMUCOSAL

## 2019-12-12 MED ORDER — NOREPINEPHRINE 4 MG/250ML-% IV SOLN
2.0000 ug/min | INTRAVENOUS | Status: DC
  Administered 2019-12-12: 14:00:00 2 ug/min via INTRAVENOUS

## 2019-12-12 MED ORDER — ALBUTEROL SULFATE (2.5 MG/3ML) 0.083% IN NEBU: 2.5000 mg | INHALATION_SOLUTION | RESPIRATORY_TRACT | Status: DC | PRN

## 2019-12-12 MED ORDER — PRO-STAT SUGAR FREE PO LIQD
30.0000 mL | Freq: Two times a day (BID) | ORAL | Status: DC
  Administered 2019-12-12 – 2019-12-13 (×3): 30 mL
  Filled 2019-12-12 (×3): qty 30

## 2019-12-12 MED ORDER — SODIUM CHLORIDE 0.9 % IV SOLN
250.0000 mL | INTRAVENOUS | Status: DC
  Administered 2019-12-12 – 2019-12-18 (×2): 250 mL via INTRAVENOUS

## 2019-12-12 MED ORDER — FREE WATER
200.0000 mL | Status: DC
  Administered 2019-12-12 – 2019-12-26 (×84): 200 mL

## 2019-12-12 MED ORDER — SUCCINYLCHOLINE CHLORIDE 20 MG/ML IJ SOLN
INTRAMUSCULAR | Status: AC | PRN
  Administered 2019-12-12: 200 mg via INTRAVENOUS

## 2019-12-12 MED ORDER — SODIUM CHLORIDE 0.9 % IV SOLN
500.0000 mg | INTRAVENOUS | Status: DC
  Administered 2019-12-12: 500 mg via INTRAVENOUS
  Filled 2019-12-12: qty 500

## 2019-12-12 MED ORDER — MIDAZOLAM 50MG/50ML (1MG/ML) PREMIX INFUSION
0.5000 mg/h | INTRAVENOUS | Status: DC
  Filled 2019-12-12: qty 50

## 2019-12-12 MED ORDER — ENOXAPARIN SODIUM 40 MG/0.4ML ~~LOC~~ SOLN: 40.0000 mg | SUBCUTANEOUS | Status: DC

## 2019-12-12 MED ORDER — SODIUM CHLORIDE 0.9 % IV SOLN
100.0000 mg | Freq: Every day | INTRAVENOUS | Status: AC
  Administered 2019-12-13 – 2019-12-16 (×4): 100 mg via INTRAVENOUS
  Filled 2019-12-12 (×4): qty 20

## 2019-12-12 MED ORDER — DEXMEDETOMIDINE HCL IN NACL 400 MCG/100ML IV SOLN
0.0000 ug/kg/h | INTRAVENOUS | Status: DC
  Administered 2019-12-12: 20:00:00 0.6 ug/kg/h via INTRAVENOUS
  Administered 2019-12-12: 14:00:00 0.4 ug/kg/h via INTRAVENOUS
  Administered 2019-12-13: 1.2 ug/kg/h via INTRAVENOUS
  Administered 2019-12-13: 0.6 ug/kg/h via INTRAVENOUS
  Administered 2019-12-13: 0.8 ug/kg/h via INTRAVENOUS
  Administered 2019-12-13 – 2019-12-14 (×3): 1.2 ug/kg/h via INTRAVENOUS
  Administered 2019-12-14: 22:00:00 0.4 ug/kg/h via INTRAVENOUS
  Administered 2019-12-14: 1.2 ug/kg/h via INTRAVENOUS
  Administered 2019-12-14: 0.8 ug/kg/h via INTRAVENOUS
  Filled 2019-12-12 (×11): qty 100

## 2019-12-12 MED ORDER — FAMOTIDINE IN NACL 20-0.9 MG/50ML-% IV SOLN
20.0000 mg | Freq: Two times a day (BID) | INTRAVENOUS | Status: DC
  Administered 2019-12-12 – 2019-12-13 (×3): 20 mg via INTRAVENOUS
  Filled 2019-12-12 (×3): qty 50

## 2019-12-12 MED ORDER — SODIUM CHLORIDE 0.9 % IV BOLUS
1000.0000 mL | Freq: Once | INTRAVENOUS | Status: AC
  Administered 2019-12-12: 1000 mL via INTRAVENOUS

## 2019-12-12 MED ORDER — ROCURONIUM BROMIDE 50 MG/5ML IV SOLN
10.0000 mg | Freq: Once | INTRAVENOUS | Status: AC
  Administered 2019-12-12: 10 mg via INTRAVENOUS

## 2019-12-12 MED ORDER — PROPOFOL 1000 MG/100ML IV EMUL
INTRAVENOUS | Status: AC
  Filled 2019-12-12: qty 100

## 2019-12-12 MED ORDER — SODIUM CHLORIDE 0.9 % IV SOLN
200.0000 mg | Freq: Once | INTRAVENOUS | Status: AC
  Administered 2019-12-12: 200 mg via INTRAVENOUS
  Filled 2019-12-12: qty 200

## 2019-12-12 MED ORDER — FENTANYL 2500MCG IN NS 250ML (10MCG/ML) PREMIX INFUSION: 0.0000 ug/h | INTRAVENOUS | Status: DC

## 2019-12-12 MED ORDER — SODIUM CHLORIDE 0.9 % IV SOLN: 100.0000 mg | Freq: Every day | INTRAVENOUS | Status: DC

## 2019-12-12 MED ORDER — PRAVASTATIN SODIUM 40 MG PO TABS
40.0000 mg | ORAL_TABLET | Freq: Every day | ORAL | Status: DC
  Administered 2019-12-12 – 2019-12-20 (×9): 40 mg via ORAL
  Filled 2019-12-12 (×9): qty 1

## 2019-12-12 MED ORDER — MIDAZOLAM HCL 2 MG/2ML IJ SOLN
1.0000 mg | INTRAMUSCULAR | Status: AC | PRN
  Administered 2019-12-13 – 2019-12-14 (×3): 1 mg via INTRAVENOUS
  Filled 2019-12-12 (×3): qty 2

## 2019-12-12 MED ORDER — DEXAMETHASONE SODIUM PHOSPHATE 10 MG/ML IJ SOLN
10.0000 mg | Freq: Once | INTRAMUSCULAR | Status: AC
  Administered 2019-12-12: 10 mg via INTRAVENOUS
  Filled 2019-12-12: qty 1

## 2019-12-12 MED ORDER — DEXAMETHASONE SODIUM PHOSPHATE 10 MG/ML IJ SOLN
6.0000 mg | Freq: Once | INTRAMUSCULAR | Status: AC
  Administered 2019-12-12: 14:00:00 6 mg via INTRAVENOUS
  Filled 2019-12-12: qty 1

## 2019-12-12 MED ORDER — FENTANYL CITRATE (PF) 100 MCG/2ML IJ SOLN
25.0000 ug | Freq: Once | INTRAMUSCULAR | Status: AC
  Administered 2019-12-15: 25 ug via INTRAVENOUS
  Filled 2019-12-12: qty 2

## 2019-12-12 MED ORDER — INSULIN ASPART 100 UNIT/ML ~~LOC~~ SOLN
0.0000 [IU] | SUBCUTANEOUS | Status: DC
  Administered 2019-12-12 (×2): 5 [IU] via SUBCUTANEOUS
  Administered 2019-12-12 – 2019-12-13 (×2): 3 [IU] via SUBCUTANEOUS
  Administered 2019-12-13: 05:00:00 5 [IU] via SUBCUTANEOUS
  Administered 2019-12-13: 3 [IU] via SUBCUTANEOUS
  Administered 2019-12-13: 5 [IU] via SUBCUTANEOUS
  Administered 2019-12-13 – 2019-12-14 (×2): 8 [IU] via SUBCUTANEOUS
  Administered 2019-12-14: 04:00:00 3 [IU] via SUBCUTANEOUS
  Administered 2019-12-14 (×2): 5 [IU] via SUBCUTANEOUS

## 2019-12-12 MED ORDER — ONDANSETRON HCL 4 MG/2ML IJ SOLN: 4.0000 mg | Freq: Four times a day (QID) | INTRAMUSCULAR | Status: DC | PRN

## 2019-12-12 MED ORDER — CHLORHEXIDINE GLUCONATE CLOTH 2 % EX PADS
6.0000 | MEDICATED_PAD | Freq: Every day | CUTANEOUS | Status: DC
  Administered 2019-12-13 – 2019-12-27 (×15): 6 via TOPICAL

## 2019-12-12 MED ORDER — FENTANYL 2500MCG IN NS 250ML (10MCG/ML) PREMIX INFUSION
25.0000 ug/h | INTRAVENOUS | Status: DC
  Administered 2019-12-12: 15:00:00 150 ug/h via INTRAVENOUS
  Administered 2019-12-12 – 2019-12-13 (×2): 175 ug/h via INTRAVENOUS
  Administered 2019-12-14: 21:00:00 125 ug/h via INTRAVENOUS
  Administered 2019-12-14 – 2019-12-16 (×3): 200 ug/h via INTRAVENOUS
  Administered 2019-12-16: 17:00:00 150 ug/h via INTRAVENOUS
  Administered 2019-12-17 – 2019-12-18 (×2): 125 ug/h via INTRAVENOUS
  Administered 2019-12-18: 150 ug/h via INTRAVENOUS
  Administered 2019-12-19: 175 ug/h via INTRAVENOUS
  Administered 2019-12-20 – 2019-12-21 (×2): 150 ug/h via INTRAVENOUS
  Filled 2019-12-12 (×14): qty 250

## 2019-12-12 MED ORDER — ETOMIDATE 2 MG/ML IV SOLN
INTRAVENOUS | Status: AC | PRN
  Administered 2019-12-12: 20 mg via INTRAVENOUS

## 2019-12-12 MED ORDER — FENTANYL BOLUS VIA INFUSION
25.0000 ug | INTRAVENOUS | Status: DC | PRN
  Administered 2019-12-13 – 2019-12-18 (×3): 25 ug via INTRAVENOUS
  Filled 2019-12-12: qty 25

## 2019-12-12 MED ORDER — MIDAZOLAM HCL 2 MG/2ML IJ SOLN
1.0000 mg | INTRAMUSCULAR | Status: DC | PRN
  Administered 2019-12-14 – 2019-12-27 (×28): 1 mg via INTRAVENOUS
  Filled 2019-12-12 (×27): qty 2

## 2019-12-12 MED ORDER — FENTANYL 2500MCG IN NS 250ML (10MCG/ML) PREMIX INFUSION
INTRAVENOUS | Status: AC
  Administered 2019-12-12: 50 ug/h via INTRAVENOUS
  Filled 2019-12-12: qty 250

## 2019-12-12 MED ORDER — CHLORHEXIDINE GLUCONATE 0.12% ORAL RINSE (MEDLINE KIT)
15.0000 mL | Freq: Two times a day (BID) | OROMUCOSAL | Status: DC
  Administered 2019-12-12: 15 mL via OROMUCOSAL

## 2019-12-12 MED ORDER — BARICITINIB 2 MG PO TABS
2.0000 mg | ORAL_TABLET | Freq: Every day | ORAL | Status: AC
  Administered 2019-12-12 – 2019-12-25 (×14): 2 mg via ORAL
  Filled 2019-12-12 (×14): qty 1

## 2019-12-12 MED ORDER — VITAL HIGH PROTEIN PO LIQD
1000.0000 mL | ORAL | Status: DC
  Administered 2019-12-12 – 2019-12-13 (×2): 1000 mL

## 2019-12-12 MED ORDER — SODIUM CHLORIDE 0.9 % IV SOLN
2.0000 g | INTRAVENOUS | Status: DC
  Administered 2019-12-12: 2 g via INTRAVENOUS
  Filled 2019-12-12: qty 20

## 2019-12-12 NOTE — Progress Notes (Addendum)
ANTICOAGULATION CONSULT NOTE - Initial Consult  Pharmacy Consult for apixaban Indication: atrial fibrillation  Allergies  Allergen Reactions  . Iohexol Other (See Comments)    Code:  HIVES, Desc:  PER ROBIN @ [facility], PT IS ALLERGIC TO IVP DYE 08/28/10 RM  . Lipitor [Atorvastatin Calcium] Hives    Patient Measurements: Actual body weight: 100.2 kg   Vital Signs: Temp: 98.6 F (37 C) (01/24 1030) Temp Source: Bladder (01/24 0910) BP: 113/68 (01/24 1150) Pulse Rate: 89 (01/24 1230)  Labs: Recent Labs    12/18/2019 0634 11/28/2019 0636  HGB 12.6  --   HCT 42.5  --   PLT 173  --   APTT 39*  --   LABPROT 17.4*  --   INR 1.4*  --   CREATININE 1.51*  --   TROPONINIHS  --  46*    Estimated Creatinine Clearance: 33.1 mL/min (A) (by C-G formula based on SCr of 1.51 mg/dL (H)).   Medical History: Past Medical History:  Diagnosis Date  . Arthritis    "in my spine" (04/15/2018)  . Blurred vision   . CAD (coronary artery disease)    Multiple percutaneous revascularization. Catheterization May 2013 left main normal, patent LAD stent, 90% circumflex stenosis, patent right coronary artery stents. Patient had a DES to the circumflex. The EF was well-preserved.  . Chronic lower back pain   . COPD (chronic obstructive pulmonary disease) ([place])   . Fatigue   . GERD (gastroesophageal reflux disease)   . Hard of hearing   . HTN (hypertension)   . Hx of radiation therapy    right nose 4500 cGy in 10 sessions over 5 weeks (2 treatments a week)  . Hyperlipemia   . NSTEMI (non-ST elevated myocardial infarction) ([place]) 08/28/2018  . On home oxygen therapy    "2L; 24/7" (08/27/2018)  . Pneumonia    "I've had a touch of it" (04/15/2018)  . Squamous cell carcinoma of nose    right    Medications:  Medications Prior to Admission  Medication Sig Dispense Refill Last Dose  . acetaminophen (TYLENOL 8 HOUR) 650 MG CR tablet Take 1 tablet (650 mg total) by mouth every 8 (eight) hours as needed.  (Patient taking differently: Take 650 mg by mouth every 8 (eight) hours as needed for pain or fever. ) 30 tablet 0 prn at prn  . albuterol (PROVENTIL HFA;VENTOLIN HFA) 108 (90 BASE) MCG/ACT inhaler Inhale 2 puffs into the lungs every 4 (four) hours as needed for wheezing. 1 Inhaler 0 prn at prn  . AMITIZA 8 MCG capsule Take 8 mcg by mouth every 12 (twelve) hours.   unknown  . amitriptyline (ELAVIL) 10 MG tablet Take 10 mg by mouth at bedtime as needed (depression).    prn  . amLODipine (NORVASC) 10 MG tablet Take 1 tablet (10 mg total) by mouth daily. 30 tablet 0 unknown  . apixaban (ELIQUIS) 5 MG TABS tablet Take 1 tablet (5 mg total) by mouth 2 (two) times daily. 60 tablet 0 unknown  . aspirin 81 MG tablet Take 1 tablet (81 mg total) by mouth daily. With food 30 tablet 5 unknown  . BROVANA 15 MCG/2ML NEBU USE 1 VIAL  IN  NEBULIZER TWICE  DAILY - morning and evening (Patient not taking: No sig reported) 60 mL 11 Not Taking at Unknown time  . budesonide (PULMICORT) 0.25 MG/2ML nebulizer solution USE 1 VIAL  IN  NEBULIZER TWICE  DAILY - rinse mouth after treatment (Patient taking differently:  Take 0.25 mg by nebulization 2 (two) times daily. ) 60 mL 11 unknown  . busPIRone (BUSPAR) 10 MG tablet Take 1 tablet (10 mg total) by mouth 2 (two) times daily. 60 tablet 2 unknown  . cholecalciferol (VITAMIN D) 1000 UNITS tablet Take 1,000 Units by mouth daily.    unknown  . dicyclomine (BENTYL) 20 MG tablet Take 1 tablet (20 mg total) by mouth 3 (three) times daily as needed for spasms. Cramping abdominal pain. (Patient taking differently: Take 20 mg by mouth every 8 (eight) hours as needed (cramping abdominal pain). ) 30 tablet 0 prn  . famotidine (PEPCID) 20 MG tablet Take 20 mg by mouth every 12 (twelve) hours as needed for heartburn.    prn  . fluticasone (FLONASE) 50 MCG/ACT nasal spray Place 2 sprays into both nostrils daily as needed for allergies or rhinitis. 16 g 0 prn  . gabapentin (NEURONTIN) 300 MG  capsule Take 300 mg by mouth every 6 (six) hours as needed for pain.   prn  . guaiFENesin (MUCINEX) 600 MG 12 hr tablet Take 1 tablet (600 mg total) by mouth 2 (two) times daily. 14 tablet 0 unknown  . isosorbide mononitrate (IMDUR) 120 MG 24 hr tablet Take 1 tablet (120 mg total) by mouth daily. 90 tablet 3 unknown  . Melatonin 5 MG TABS Take 5 mg by mouth at bedtime.   unknown  . nitroGLYCERIN (NITROSTAT) 0.4 MG SL tablet Place 0.4 mg under the tongue every 5 (five) minutes as needed for chest pain.   prn  . pantoprazole (PROTONIX) 40 MG tablet Take 40 mg by mouth daily.    unknown  . polyethylene glycol (MIRALAX) packet Take 17 g by mouth daily as needed for mild constipation or moderate constipation. 14 each 0 prn  . pravastatin (PRAVACHOL) 40 MG tablet Take 40 mg by mouth at bedtime.   unknown  . predniSONE (DELTASONE) 20 MG tablet Take 40 mg by mouth daily.   unknown  . trolamine salicylate (ASPERCREME) 10 % cream Apply 1 application topically daily as needed for muscle pain.   prn  . YUPELRI 175 MCG/3ML SOLN USE 1 VIAL IN NEBULIZER DAILY - DO NOT MIX WITH OTHER NEB MEDS,USE BEFORE OR AFTER (Patient taking differently: Inhale 3 mLs into the lungs at bedtime. ) 30 mL 11 unknown    Assessment: 23 YOF with COVID-19 pneumonia was recently started on apixaban 5 mg twice daily for AFib and high CHADSVASC score. Pharmacy consulted to resume home apixaban. H/H and Plt wnl. SCr is elevated at 1.51 and Age > 80. Will dose adjust apixaban until renal fx recovers   Goal of Therapy:  Primary stroke prevention Monitor platelets by anticoagulation protocol: Yes   Plan:  -D/c SQ heparin order (no doses given yet)  -Apixaban 2.5 mg twice daily -Monitor renal fx, CBC and s/s of bleeding  Albertina Parr, PharmD., BCPS Clinical Pharmacist Clinical phone for 12/02/2019 until 5pm: [phone]  Addendum: Now adding baricitinib. CrCl 33 mL/min. No h/o of immunosuppressive disease states. Will start renally  dose adjusted baricitinib at 2 mg daily x 14 days.   Albertina Parr, PharmD., BCPS Clinical Pharmacist

## 2019-12-12 NOTE — ED Notes (Signed)
Date and time results received: 12/04/2019 0835   Test: lactic acid  Critical Value: 2.5  Name of Provider Notified: Dr. Owens Shark

## 2019-12-12 NOTE — Plan of Care (Signed)
Initiated care plan 

## 2019-12-12 NOTE — Progress Notes (Signed)
CODE SEPSIS - PHARMACY COMMUNICATION  **Broad Spectrum Antibiotics should be administered within 1 hour of Sepsis diagnosis**  Time Code Sepsis Called/Page Received: 0624  Antibiotics Ordered: Rocephin and Zithromax  Time of 1st antibiotic administration: 0725  Additional action taken by pharmacy: n/a  If necessary, Name of Provider/Nurse Contacted: n/a    Ena Dawley ,PharmD Clinical Pharmacist  12/11/2019  6:25 AM

## 2019-12-12 NOTE — Consult Note (Signed)
Remdesivir - Pharmacy Brief Note  1/24 SARS Coronavirus 2 Ag: positive    O:  ALT: 25 CXR: Patchy bilateral airspace disease possible pneumonia. SpO2: 77 % on Room Air    A/P:  Remdesivir 200 mg IVPB once followed by 100 mg IVPB daily x 4 days.   Pernell Dupre, PharmD, BCPS Clinical Pharmacist 12/16/2019 7:49 AM

## 2019-12-12 NOTE — Progress Notes (Signed)
Notified provider of need to order additional fluid bolus. He didn't feel that it was appropriate for this patient.

## 2019-12-12 NOTE — ED Provider Notes (Addendum)
[facility] Emergency Department Provider Note  ____________________________________________   First MD Initiated Contact with Patient 12/19/2019 [phone]     (approximate)  I have reviewed the triage vital signs and the nursing notes.  Level 5 caveat history review of system limited secondary to severe respiratory distress.    HISTORY  Chief Complaint Altered Mental Status and Respiratory Distress    HPI [name] is a 85 y.o. female with below list of previous medical conditions including COPD pneumonia and CAD presents to the emergency department via EMS from [place] secondary to initial call for unresponsive on their arrival EMS states that patient's oxygen saturation 77% with the patient combative.  EMS attempted to place a nonrebreather on the patient however this was difficult to do that secondary to her combative nature.  EMS administered 2 mg of IM Versed e before arrival to the emergency department        Past Medical History:  Diagnosis Date  . Arthritis    "in my spine" (04/15/2018)  . Blurred vision   . CAD (coronary artery disease)    Multiple percutaneous revascularization. Catheterization May 2013 left main normal, patent LAD stent, 90% circumflex stenosis, patent right coronary artery stents. Patient had a DES to the circumflex. The EF was well-preserved.  . Chronic lower back pain   . COPD (chronic obstructive pulmonary disease) ([place])   . Fatigue   . GERD (gastroesophageal reflux disease)   . Hard of hearing   . HTN (hypertension)   . Hx of radiation therapy    right nose 4500 cGy in 10 sessions over 5 weeks (2 treatments a week)  . Hyperlipemia   . NSTEMI (non-ST elevated myocardial infarction) ([place]) 08/28/2018  . On home oxygen therapy    "2L; 24/7" (08/27/2018)  . Pneumonia    "I've had a touch of it" (04/15/2018)  . Squamous cell carcinoma of nose    right    Patient Active Problem List   Diagnosis Date Noted    . Acute metabolic encephalopathy 02/58/5277  . Weakness generalized 11/26/2019  . Coronary artery disease involving native coronary artery of native heart with angina pectoris ([place]) 04/06/2019  . Dyslipidemia 04/06/2019  . Shortness of breath 04/06/2019  . Educated about COVID-19 virus infection 04/06/2019  . Acute on chronic diastolic heart failure ([place])   . NSTEMI (non-ST elevated myocardial infarction) ([place]) 08/28/2018  . Acute kidney injury ([place]) 08/28/2018  . Acute exacerbation of chronic obstructive pulmonary disease (COPD) ([place]) 08/28/2018  . GERD (gastroesophageal reflux disease) 04/14/2018  . Back pain 04/14/2018  . Right flank pain 04/14/2018  . COPD, group D, by GOLD 2017 classification ([place]) 10/06/2017  . Depression 10/05/2017  . Rectal pain 10/05/2017  . AP (abdominal pain)   . Atypical chest pain 11/28/2014  . Chest pain 11/28/2014  . Tobacco abuse 11/28/2014  . Skin cancer of nose   . Squamous cell carcinoma of skin of other and unspecified parts of face 10/15/2013  . COPD exacerbation ([place]) 01/30/2013  . Abdominal pain 01/30/2013  . Constipation 01/30/2013  . Hypokalemia 01/30/2013  . Leukocytosis 05/14/2012  . HTN (hypertension) 05/14/2012  . Progressive angina ([place]) 05/07/2012  . Hyperlipemia   . Dysphagia, unspecified(787.20) 02/04/2012  . Esophageal reflux 02/04/2012  . Coronary atherosclerosis 08/28/2010  . ANAL OR RECTAL PAIN 08/28/2010  . NAUSEA ALONE 08/28/2010    Past Surgical History:  Procedure Laterality Date  . ABDOMINAL HYSTERECTOMY    . BACK SURGERY  ?  Marland Kitchen  CORONARY ANGIOPLASTY WITH STENT PLACEMENT  2002-2009  . CORONARY ANGIOPLASTY WITH STENT PLACEMENT  05/06/12   "1; this makes 5"  . DILATION AND CURETTAGE OF UTERUS    . ESOPHAGEAL DILATION  ~ 2012   Dr. Deatra Ina  . LUMBAR SPINE SURGERY  04/2011   Rods, screws and cages  . PERCUTANEOUS CORONARY STENT INTERVENTION (PCI-S) N/A 05/06/2012   Procedure: PERCUTANEOUS CORONARY STENT INTERVENTION  (PCI-S);  Surgeon: Burnell Blanks, MD;  Location: [facility] CATH LAB;  Service: Cardiovascular;  Laterality: N/A;  . SKIN BIOPSY  10/15/13   right nose-microinvasive squamous cell carcinoma  . TUBAL LIGATION      Prior to Admission medications   Medication Sig Start Date End Date Taking? Authorizing Provider  acetaminophen (TYLENOL 8 HOUR) 650 MG CR tablet Take 1 tablet (650 mg total) by mouth every 8 (eight) hours as needed. Patient taking differently: Take 650 mg by mouth every 8 (eight) hours as needed for pain or fever.  03/16/18  Yes Varney Biles, MD  albuterol (PROVENTIL HFA;VENTOLIN HFA) 108 (90 BASE) MCG/ACT inhaler Inhale 2 puffs into the lungs every 4 (four) hours as needed for wheezing. 12/01/14  Yes Delfina Redwood, MD  AMITIZA 8 MCG capsule Take 8 mcg by mouth every 12 (twelve) hours. 11/21/19  Yes [provider]  amitriptyline (ELAVIL) 10 MG tablet Take 10 mg by mouth at bedtime as needed (depression).    Yes [provider]  amLODipine (NORVASC) 10 MG tablet Take 1 tablet (10 mg total) by mouth daily. 12/04/19 01/03/20 Yes Donne Hazel, MD  apixaban (ELIQUIS) 5 MG TABS tablet Take 1 tablet (5 mg total) by mouth 2 (two) times daily. 12/03/19 01/02/20 Yes Donne Hazel, MD  aspirin 81 MG tablet Take 1 tablet (81 mg total) by mouth daily. With food 09/01/18  Yes Emokpae, Courage, MD  budesonide (PULMICORT) 0.25 MG/2ML nebulizer solution USE 1 VIAL  IN  NEBULIZER TWICE  DAILY - rinse mouth after treatment Patient taking differently: Take 0.25 mg by nebulization 2 (two) times daily.  08/30/19  Yes Mannam, Praveen, MD  busPIRone (BUSPAR) 10 MG tablet Take 1 tablet (10 mg total) by mouth 2 (two) times daily. 09/01/18  Yes Emokpae, Courage, MD  cholecalciferol (VITAMIN D) 1000 UNITS tablet Take 1,000 Units by mouth daily.    Yes [provider]  dicyclomine (BENTYL) 20 MG tablet Take 1 tablet (20 mg total) by mouth 3 (three) times daily as needed for  spasms. Cramping abdominal pain. Patient taking differently: Take 20 mg by mouth every 8 (eight) hours as needed (cramping abdominal pain).  08/16/17  Yes Charlesetta Shanks, MD  famotidine (PEPCID) 20 MG tablet Take 20 mg by mouth every 12 (twelve) hours as needed for heartburn.  12/11/18  Yes [provider]  fluticasone (FLONASE) 50 MCG/ACT nasal spray Place 2 sprays into both nostrils daily as needed for allergies or rhinitis. 10/12/17  Yes Cherene Altes, MD  gabapentin (NEURONTIN) 300 MG capsule Take 300 mg by mouth every 6 (six) hours as needed for pain. 11/22/19  Yes [provider]  guaiFENesin (MUCINEX) 600 MG 12 hr tablet Take 1 tablet (600 mg total) by mouth 2 (two) times daily. 09/01/18  Yes Roxan Hockey, MD  isosorbide mononitrate (IMDUR) 120 MG 24 hr tablet Take 1 tablet (120 mg total) by mouth daily. 04/14/19  Yes Minus Breeding, MD  Melatonin 5 MG TABS Take 5 mg by mouth at bedtime.   Yes [provider]  nitroGLYCERIN (NITROSTAT) 0.4 MG SL tablet Place 0.4 mg under the tongue every 5 (five) minutes as needed for chest pain.   Yes [provider]  pantoprazole (PROTONIX) 40 MG tablet Take 40 mg by mouth daily.  03/17/19  Yes [provider]  polyethylene glycol (MIRALAX) packet Take 17 g by mouth daily as needed for mild constipation or moderate constipation. 09/01/18  Yes Emokpae, Courage, MD  pravastatin (PRAVACHOL) 40 MG tablet Take 40 mg by mouth at bedtime.   Yes [provider]  predniSONE (DELTASONE) 20 MG tablet Take 40 mg by mouth daily.   Yes [provider]  trolamine salicylate (ASPERCREME) 10 % cream Apply 1 application topically daily as needed for muscle pain.   Yes [provider]  YUPELRI 175 MCG/3ML SOLN USE 1 VIAL IN NEBULIZER DAILY - DO NOT MIX WITH OTHER NEB MEDS,USE BEFORE OR AFTER Patient taking differently: Inhale 3 mLs into the lungs at bedtime.  08/30/19  Yes Mannam, Praveen, MD  BROVANA  15 MCG/2ML NEBU USE 1 VIAL  IN  NEBULIZER TWICE  DAILY - morning and evening Patient not taking: No sig reported 08/30/19   Mannam, Hart Robinsons, MD    Allergies Iohexol and Lipitor [atorvastatin calcium]  Family History  Problem Relation Age of Onset  . Glaucoma Father   . Heart attack Father   . Bone cancer Mother     Social History Social History   Tobacco Use  . Smoking status: Former Smoker    Packs/day: 0.50    Years: 55.00    Pack years: 27.50    Types: Cigarettes    Quit date: 11/28/2012    Years since quitting: 7.0  . Smokeless tobacco: Never Used  Substance Use Topics  . Alcohol use: Never    Alcohol/week: 0.0 standard drinks    Comment: "drank one time; never again"  . Drug use: Never    Review of Systems Constitutional: Positive for fever noted on arrival to ED. Eyes: No visual changes. ENT: No sore throat. Cardiovascular: Denies chest pain. Respiratory: Positive for respiratory distress Gastrointestinal: No abdominal pain.  No nausea, no vomiting.  No diarrhea.  No constipation. Genitourinary: Negative for dysuria. Musculoskeletal: Negative for neck pain.  Negative for back pain. Integumentary: Negative for rash. Neurological: Negative for headaches, focal weakness or numbness.  ____________________________________________   PHYSICAL EXAM:  VITAL SIGNS: ED Triage Vitals  Enc Vitals Group     BP      Pulse      Resp      Temp      Temp src      SpO2      Weight      Height      Head Circumference      Peak Flow      Pain Score      Pain Loc      Pain Edu?      Excl. in [place]?     Constitutional: Apparent respiratory distress Eyes: Conjunctivae are normal.  Head: Atraumatic. Mouth/Throat: Patient is wearing a mask. Neck: No stridor.  No meningeal signs.   Cardiovascular: Tachycardia, regular regular rhythm Respiratory: Tachypnea, positive accessory respiratory muscle use, diffuse rhonchi on auscultation. Gastrointestinal: Soft and  nontender. No distention.  Musculoskeletal: No lower extremity tenderness nor edema. No gross deformities of extremities. Neurologic:   No gross focal neurologic deficits are appreciated.  Skin:  Skin is warm, dry and intact. Psychiatric: Combative. speech and behavior are normal.  ____________________________________________  LABS (all labs ordered are listed, but only abnormal results are displayed)  Labs Reviewed  APTT - Abnormal; Notable for the following components:      Result Value   aPTT 39 (*)    All other components within normal limits  PROTIME-INR - Abnormal; Notable for the following components:   Prothrombin Time 17.4 (*)    INR 1.4 (*)    All other components within normal limits  URINALYSIS, ROUTINE W REFLEX MICROSCOPIC - Abnormal; Notable for the following components:   Color, Urine YELLOW (*)    APPearance HAZY (*)    Glucose, UA 50 (*)    Ketones, ur 5 (*)    Protein, ur 30 (*)    Bacteria, UA RARE (*)    All other components within normal limits  BLOOD GAS, VENOUS - Abnormal; Notable for the following components:   pCO2, Ven 73 (*)    Bicarbonate 38.5 (*)    Acid-Base Excess 9.6 (*)    All other components within normal limits  POC SARS CORONAVIRUS 2 AG - Abnormal; Notable for the following components:   SARS Coronavirus 2 Ag POSITIVE (*)    All other components within normal limits  CULTURE, BLOOD (ROUTINE X 2)  CULTURE, BLOOD (ROUTINE X 2)  URINE CULTURE  RESPIRATORY PANEL BY RT PCR (FLU A&B, COVID)  LACTIC ACID, PLASMA  LACTIC ACID, PLASMA  COMPREHENSIVE METABOLIC PANEL  CBC WITH DIFFERENTIAL/PLATELET  POC SARS CORONAVIRUS 2 AG -  ED  TROPONIN I (HIGH SENSITIVITY)   ____________________________________________  EKG  ED ECG REPORT I, [place] N Kellsey Sansone, the attending physician, personally viewed and interpreted this ECG.   Date: 12/06/2019  EKG Time: 6:16 AM  Rate: 147  Rhythm: Atrial fibrillation with rapid ventricular response  Axis:  Normal  Intervals:Normal  ST&T Change: None  ____________________________________________  RADIOLOGY I, [place] N Phung Kotas, personally viewed and evaluated these images (plain radiographs) as part of my medical decision making, as well as reviewing the written report by the radiologist.  ED MD interpretation: Patchy bilateral airspace disease compatible with pneumonia.  Official radiology report(s): DG Chest Portable 1 View  Result Date: 12/06/2019 CLINICAL DATA:  Intubation EXAM: PORTABLE CHEST 1 VIEW COMPARISON:  11/26/2019 FINDINGS: Endotracheal tube in good position. NG tube enters the stomach with the tip not visualized. Mild patchy airspace disease bilaterally with progression. No effusion. No heart failure. Cardiac enlargement. IMPRESSION: Endotracheal tube in good position Patchy bilateral airspace disease possible pneumonia. Electronically Signed   By: Franchot Gallo M.D.   On: 11/24/2019 06:49   DG Abd Portable 1 View  Result Date: 11/23/2019 CLINICAL DATA:  OG tube placement EXAM: PORTABLE ABDOMEN - 1 VIEW COMPARISON:  None. FINDINGS: Enteric tube terminates in the proximal gastric body. Nonobstructive bowel gas pattern. Patchy opacities at the lung bases, left greater than right. Cardiomegaly. Lumbar spine fixation hardware, incompletely visualized. IMPRESSION: Enteric tube terminates in the proximal gastric body. Electronically Signed   By: Julian Hy M.D.   On: 11/25/2019 07:47    ____________________________________________   PROCEDURES   .Critical Care Performed by: Gregor Hams, MD Authorized by: Gregor Hams, MD   Critical care provider statement:    Critical care time (minutes):  60   Critical care time was exclusive of:  Separately billable procedures and treating other patients   Critical care was necessary to treat or prevent imminent or life-threatening deterioration of the following conditions:  Respiratory failure   Critical care was time  spent personally  by me on the following activities:  Development of treatment plan with patient or surrogate, discussions with consultants, evaluation of patient's response to treatment, examination of patient, obtaining history from patient or surrogate, ordering and performing treatments and interventions, ordering and review of laboratory studies, ordering and review of radiographic studies, pulse oximetry, re-evaluation of patient's condition and review of old charts Procedure Name: Intubation Date/Time: 12/07/2019 7:43 AM Performed by: Gregor Hams, MD Pre-anesthesia Checklist: Patient identified, Emergency Drugs available, Suction available and Patient being monitored Preoxygenation: Pre-oxygenation with 100% oxygen Induction Type: IV induction and Rapid sequence Laryngoscope Size: Mac and 4 Tube size: 7.5 mm Placement Confirmation: ETT inserted through vocal cords under direct vision,  CO2 detector and Breath sounds checked- equal and bilateral Secured at: 21 cm Dental Injury: Teeth and Oropharynx as per pre-operative assessment  Difficulty Due To: Difficulty was anticipated        ____________________________________________   INITIAL IMPRESSION / MDM / [place] / ED COURSE  As part of my medical decision making, I reviewed the following data within the electronic MEDICAL RECORD NUMBER  85 year old female presented to the emergency department above history and physical exam differential diagnosis including but not limited to acute respiratory failure with hypoxia most likely due to COVID-19 versus  Bacterial pneumonia.  Patient noted to be Covid positive here in the emergency department.  Given patient severe respiratory distress with hypoxia endotracheal intubation performed.  Patiently currently sedated with propofol and fentanyl.  Patient currently on Levophed 5 mcg secondary to tension.  Patient given Decadron 10 mg IV.  Remdesivir ordered.  Patient also given  ceftriaxone 2 g IV and azithromycin 500 mg IV.  Patient discussed with Dr. Lanney Gins intensivist here at [facility] and then subsequently discussed with Dr. [place] Bells at [facility] who accepted the patient in transfer. I notified the patient's son Cletus Gash of his mother's clinical status.       ____________________________________________  FINAL CLINICAL IMPRESSION(S) / ED DIAGNOSES  Final diagnoses:  Acute respiratory failure with hypoxia (HCC)  Pneumonia due to COVID-19 virus     MEDICATIONS GIVEN DURING THIS VISIT:  Medications  propofol (DIPRIVAN) 1000 MG/100ML infusion (40 mcg/kg/min  98.2 kg Intravenous Rate/Dose Change 11/23/2019 0704)  cefTRIAXone (ROCEPHIN) 2 g in sodium chloride 0.9 % 100 mL IVPB (2 g Intravenous New Bag/Given 11/22/2019 0731)  azithromycin (ZITHROMAX) 500 mg in sodium chloride 0.9 % 250 mL IVPB (has no administration in time range)  fentaNYL 2576mg in NS 2562m(101mml) infusion-PREMIX (150 mcg/hr Intravenous Rate/Dose Change 12/13/2019 0721)  midazolam (VERSED) 50 mg/50 mL (1 mg/mL) premix infusion (0 mg/hr Intravenous Hold 12/03/2019 0735)  norepinephrine (LEVOPHED) 4mg32m 250mL84mmix infusion (7.5 mcg/min Intravenous Rate/Dose Change 12/11/2019 0732)  rocuronium (ZEMURON) injection 10 mg (10 mg Intravenous Given 11/20/2019 0704)  sodium chloride 0.9 % bolus 1,000 mL (1,000 mLs Intravenous New Bag/Given 12/05/2019 0718)  sodium chloride 0.9 % bolus 1,000 mL (1,000 mLs Intravenous New Bag/Given 12/16/2019 0718)  succinylcholine (ANECTINE) injection (200 mg Intravenous Given 11/22/2019 0612)  etomidate (AMIDATE) injection (20 mg Intravenous Given 12/06/2019 0611)  dexamethasone (DECADRON) injection 10 mg (10 mg Intravenous Given 12/09/2019 0745)     ED Discharge Orders    None      *Please note:  [name] ANDRUSevaluated in Emergency Department on 12/19/2019 for the symptoms described in the history of present illness. She was evaluated in the context of the  global COVID-19 pandemic, which necessitated consideration that the patient  might be at risk for infection with the SARS-CoV-2 virus that causes COVID-19. Institutional protocols and algorithms that pertain to the evaluation of patients at risk for COVID-19 are in a state of rapid change based on information released by regulatory bodies including the CDC and federal and state organizations. These policies and algorithms were followed during the patient's care in the ED.  Some ED evaluations and interventions may be delayed as a result of limited staffing during the pandemic.*  Note:  This document was prepared using Dragon voice recognition software and may include unintentional dictation errors.   Gregor Hams, MD 12/15/2019 [id number]    Gregor Hams, MD 12/06/2019 [phone]

## 2019-12-12 NOTE — H&P (Signed)
NAME:  [name], MRN:  [id number], DOB:  [birth year], LOS: 0 ADMISSION DATE:  12/04/2019, CONSULTATION DATE:   December 12, 2019 REFERRING MD: Dr. Owens Shark, CHIEF COMPLAINT:  Brought in by EMS confused, hypoxemic  Brief History   85 year old female recently diagnosed with Covid pneumonia brought to the [facility] emergency room combative hypoxemic on January 24 from a nursing home.  Required intubation, transferred to [facility] for further management.  History of present illness   85 year old female recently diagnosed with Covid pneumonia brought to the [place] emergency room combative hypoxemic on January 24 from a nursing home.  Required intubation, transferred to [facility] for further management. She was intubated upon arrival and cannot provide further history.  By chart review it seems that she takes Eliquis for atrial fibrillation.  Past Medical History  Squamous cell carcinoma of the nose COPD on 2 L oxygen Coronary artery disease Hyperlipidemia Hypertension GERD Osteoarthritis  Significant Hospital Events   1/24 intubation/admit  Consults:    Procedures:  1/24 ETT >   Significant Diagnostic Tests:    Micro Data:  1/24 POC SARS CoV2 > positive  Antimicrobials:  1/24 decadron >  1/24 remdesivir >   Interim history/subjective:  As above  Objective   There were no vitals taken for this visit.    Vent Mode: AC FiO2 (%):  [100 %] 100 % Set Rate:  [20 bmp] 20 bmp Vt Set:  [450 mL] 450 mL PEEP:  [5 cmH20] 5 cmH20  No intake or output data in the 24 hours ending 11/30/2019 1200 There were no vitals filed for this visit.  Examination:  General:  In bed on vent HENT: NCAT ETT in place PULM: CTA B, vent supported breathing CV: RRR, no mgr, radial pulses intact, cap refill normal GI: BS+, soft, nontender MSK: normal bulk and tone Neuro: sedated on vent    Resolved Hospital Problem list     Assessment & Plan:  COVID pneumonia causing  severe acute respiratory failure Admit to ICU Continue mechanical ventilation per ARDS protocol Target TVol 6-8cc/kgIBW Target Plateau Pressure < 30cm H20 Target driving pressure less than 15 cm of water Target PaO2 55-65: titrate PEEP/FiO2 per protocol As long as PaO2 to FiO2 ratio is less than 1:150 position in prone position for 16 hours a day Check CVP daily if CVL in place Target CVP less than 4, diurese as necessary Ventilator associated pneumonia prevention protocol Decadron 10 days remdesivir 5 days Family says that she has never had TB or viral hepatitis, so will start baricitinib Check ABG  Sepsis in setting of COVID infection: lactic acid normal No more IV fluids  Hypotension in setting of propfol use Stop propofol Start precedex  Need for sedation for mechanical ventilation Acute metabolic encephalopathy due to hypoxemia from COVID Start PAD protocol: precedex, fentanyl infusion, prn versed  AKI: Gentle IV fluids overnight Monitor BMET and UOP Replace electrolytes as needed  CODE STATUS: Son says that she is DNR No more fluids for sepsis  CAD Tele Continue statin Hold home nitrate for now  Essential Hypertension Hold home amlodipine  Atrial fib Eliquis  Best practice:  Diet: start tube feeding and free water Pain/Anxiety/Delirium protocol (if indicated): yes, as above VAP protocol (if indicated): yes DVT prophylaxis: Eliquis GI prophylaxis: famotidine Glucose control: SSI q4h accuchecks Mobility: bed rest Code Status: full Family Communication: I updated her son by phone on 1/24 Disposition: remain in ICU  Labs   CBC: Recent Labs  Lab 12/15/2019 0634  WBC 9.9  NEUTROABS 9.1*  HGB 12.6  HCT 42.5  MCV 103.9*  PLT [id number]    Basic Metabolic Panel: Recent Labs  Lab 12/03/2019 0634  NA 142  K 5.2*  CL 98  CO2 33*  GLUCOSE 161*  BUN 32*  CREATININE 1.51*  CALCIUM 8.4*   GFR: Estimated Creatinine Clearance: 33.1 mL/min (A) (by C-G  formula based on SCr of 1.51 mg/dL (H)). Recent Labs  Lab 12/06/2019 0634 11/26/2019 0636 12/09/2019 0900  WBC 9.9  --   --   LATICACIDVEN  --  2.5* 1.1    Liver Function Tests: Recent Labs  Lab 12/13/2019 0634  AST 25  ALT 25  ALKPHOS 49  BILITOT 0.9  PROT 6.2*  ALBUMIN 3.1*   No results for input(s): LIPASE, AMYLASE in the last 168 hours. No results for input(s): AMMONIA in the last 168 hours.  ABG    Component Value Date/Time   PHART 7.380 10/06/2017 0014   PCO2ART 48.4 (H) 10/06/2017 0014   PO2ART 59.0 (L) 10/06/2017 0014   HCO3 38.5 (H) 11/25/2019 0647   TCO2 32 03/16/2018 1210   O2SAT 64.2 11/19/2019 0647     Coagulation Profile: Recent Labs  Lab 11/28/2019 0634  INR 1.4*    Cardiac Enzymes: No results for input(s): CKTOTAL, CKMB, CKMBINDEX, TROPONINI in the last 168 hours.  HbA1C: Hgb A1c MFr Bld  Date/Time Value Ref Range Status  10/06/2017 04:47 AM 6.5 (H) 4.8 - 5.6 % Final    Comment:    (NOTE) Pre diabetes:          5.7%-6.4% Diabetes:              >6.4% Glycemic control for   <7.0% adults with diabetes   11/29/2014 04:32 AM 6.2 (H) <5.7 % Final    Comment:    (NOTE)                                                                       According to the ADA Clinical Practice Recommendations for 2011, when HbA1c is used as a screening test:  >=6.5%   Diagnostic of Diabetes Mellitus           (if abnormal result is confirmed) 5.7-6.4%   Increased risk of developing Diabetes Mellitus References:Diagnosis and Classification of Diabetes Mellitus,Diabetes [id number] 1):S62-S69 and Standards of Medical Care in         Diabetes - 2011,Diabetes [id number] (Suppl 1):S11-S61.     CBG: No results for input(s): GLUCAP in the last 168 hours.  Review of Systems:   Cannot obtain due to intubation  Past Medical History  She,  has a past medical history of Arthritis, Blurred vision, CAD (coronary artery disease), Chronic lower back pain, COPD  (chronic obstructive pulmonary disease) (HCC), Fatigue, GERD (gastroesophageal reflux disease), Hard of hearing, HTN (hypertension), radiation therapy, Hyperlipemia, NSTEMI (non-ST elevated myocardial infarction) ([place]) (08/28/2018), On home oxygen therapy, Pneumonia, and Squamous cell carcinoma of nose.   Surgical History    Past Surgical History:  Procedure Laterality Date  . ABDOMINAL HYSTERECTOMY    . BACK SURGERY  ?  . CORONARY ANGIOPLASTY WITH STENT PLACEMENT  2002-2009  . CORONARY ANGIOPLASTY WITH STENT PLACEMENT  05/06/12   "  1; this makes 5"  . DILATION AND CURETTAGE OF UTERUS    . ESOPHAGEAL DILATION  ~ 2012   Dr. Deatra Ina  . LUMBAR SPINE SURGERY  04/2011   Rods, screws and cages  . PERCUTANEOUS CORONARY STENT INTERVENTION (PCI-S) N/A 05/06/2012   Procedure: PERCUTANEOUS CORONARY STENT INTERVENTION (PCI-S);  Surgeon: Burnell Blanks, MD;  Location: [facility] CATH LAB;  Service: Cardiovascular;  Laterality: N/A;  . SKIN BIOPSY  10/15/13   right nose-microinvasive squamous cell carcinoma  . TUBAL LIGATION       Social History   reports that she quit smoking about 7 years ago. Her smoking use included cigarettes. She has a 27.50 pack-year smoking history. She has never used smokeless tobacco. She reports that she does not drink alcohol or use drugs.   Family History   Her family history includes Bone cancer in her mother; Glaucoma in her father; Heart attack in her father.   Allergies Allergies  Allergen Reactions  . Iohexol Other (See Comments)    Code:  HIVES, Desc:  PER ROBIN @ GI, PT IS ALLERGIC TO IVP DYE 08/28/10 RM  . Lipitor [Atorvastatin Calcium] Hives     Home Medications  Prior to Admission medications   Medication Sig Start Date End Date Taking? Authorizing Provider  acetaminophen (TYLENOL 8 HOUR) 650 MG CR tablet Take 1 tablet (650 mg total) by mouth every 8 (eight) hours as needed. Patient taking differently: Take 650 mg by mouth every 8 (eight) hours as needed  for pain or fever.  03/16/18   Varney Biles, MD  albuterol (PROVENTIL HFA;VENTOLIN HFA) 108 (90 BASE) MCG/ACT inhaler Inhale 2 puffs into the lungs every 4 (four) hours as needed for wheezing. 12/01/14   Delfina Redwood, MD  AMITIZA 8 MCG capsule Take 8 mcg by mouth every 12 (twelve) hours. 11/21/19   [provider]  amitriptyline (ELAVIL) 10 MG tablet Take 10 mg by mouth at bedtime as needed (depression).     [provider]  amLODipine (NORVASC) 10 MG tablet Take 1 tablet (10 mg total) by mouth daily. 12/04/19 01/03/20  Donne Hazel, MD  apixaban (ELIQUIS) 5 MG TABS tablet Take 1 tablet (5 mg total) by mouth 2 (two) times daily. 12/03/19 01/02/20  Donne Hazel, MD  aspirin 81 MG tablet Take 1 tablet (81 mg total) by mouth daily. With food 09/01/18   Roxan Hockey, MD  BROVANA 15 MCG/2ML NEBU USE 1 VIAL  IN  NEBULIZER TWICE  DAILY - morning and evening Patient not taking: No sig reported 08/30/19   Mannam, Praveen, MD  budesonide (PULMICORT) 0.25 MG/2ML nebulizer solution USE 1 VIAL  IN  NEBULIZER TWICE  DAILY - rinse mouth after treatment Patient taking differently: Take 0.25 mg by nebulization 2 (two) times daily.  08/30/19   Mannam, Hart Robinsons, MD  busPIRone (BUSPAR) 10 MG tablet Take 1 tablet (10 mg total) by mouth 2 (two) times daily. 09/01/18   Roxan Hockey, MD  cholecalciferol (VITAMIN D) 1000 UNITS tablet Take 1,000 Units by mouth daily.     [provider]  dicyclomine (BENTYL) 20 MG tablet Take 1 tablet (20 mg total) by mouth 3 (three) times daily as needed for spasms. Cramping abdominal pain. Patient taking differently: Take 20 mg by mouth every 8 (eight) hours as needed (cramping abdominal pain).  08/16/17   Charlesetta Shanks, MD  famotidine (PEPCID) 20 MG tablet Take 20 mg by mouth every 12 (twelve) hours as needed for heartburn.  12/11/18  [provider]  fluticasone (FLONASE) 50 MCG/ACT nasal spray Place 2 sprays into both nostrils daily as  needed for allergies or rhinitis. 10/12/17   Cherene Altes, MD  gabapentin (NEURONTIN) 300 MG capsule Take 300 mg by mouth every 6 (six) hours as needed for pain. 11/22/19   [provider]  guaiFENesin (MUCINEX) 600 MG 12 hr tablet Take 1 tablet (600 mg total) by mouth 2 (two) times daily. 09/01/18   Roxan Hockey, MD  isosorbide mononitrate (IMDUR) 120 MG 24 hr tablet Take 1 tablet (120 mg total) by mouth daily. 04/14/19   Minus Breeding, MD  Melatonin 5 MG TABS Take 5 mg by mouth at bedtime.    [provider]  nitroGLYCERIN (NITROSTAT) 0.4 MG SL tablet Place 0.4 mg under the tongue every 5 (five) minutes as needed for chest pain.    [provider]  pantoprazole (PROTONIX) 40 MG tablet Take 40 mg by mouth daily.  03/17/19   [provider]  polyethylene glycol (MIRALAX) packet Take 17 g by mouth daily as needed for mild constipation or moderate constipation. 09/01/18   Roxan Hockey, MD  pravastatin (PRAVACHOL) 40 MG tablet Take 40 mg by mouth at bedtime.    [provider]  predniSONE (DELTASONE) 20 MG tablet Take 40 mg by mouth daily.    [provider]  trolamine salicylate (ASPERCREME) 10 % cream Apply 1 application topically daily as needed for muscle pain.    [provider]  YUPELRI 175 MCG/3ML SOLN USE 1 VIAL IN NEBULIZER DAILY - DO NOT MIX WITH OTHER NEB MEDS,USE BEFORE OR AFTER Patient taking differently: Inhale 3 mLs into the lungs at bedtime.  08/30/19   Marshell Garfinkel, MD     Critical care time: 40 minutes    Roselie Awkward, MD [place] Pager: [phone] Cell: [phone] If no response, call [phone]

## 2019-12-12 NOTE — ED Triage Notes (Signed)
Patient presents to Emergency Department via [facility] from [facility] with complaints of O2 Sats 77% and combative   EMS gave 2 mg Versed IM

## 2019-12-12 NOTE — Progress Notes (Signed)
Patient's son [name] called and was updated on patient's status and care.  All questions answered.

## 2019-12-12 NOTE — Progress Notes (Signed)
Attempted to notify bedside nurse of need to obtain additional order fluid bolus. Patient is transferring to [facility] at this time. Will need to contact receiving hospital for additional fluid bolus.

## 2019-12-12 NOTE — ED Notes (Signed)
EMTALA reviewed. 

## 2019-12-12 NOTE — ED Notes (Signed)
X-ray at bedside

## 2019-12-13 LAB — CBC
HCT: 43.1 % (ref 36.0–46.0)
Hemoglobin: 13.4 g/dL (ref 12.0–15.0)
MCH: 30.7 pg (ref 26.0–34.0)
MCHC: 31.1 g/dL (ref 30.0–36.0)
MCV: 98.9 fL (ref 80.0–100.0)
Platelets: 133 10*3/uL — ABNORMAL LOW (ref 150–400)
RBC: 4.36 MIL/uL (ref 3.87–5.11)
RDW: 12.9 % (ref 11.5–15.5)
WBC: 5.2 10*3/uL (ref 4.0–10.5)
nRBC: 0 % (ref 0.0–0.2)

## 2019-12-13 LAB — BASIC METABOLIC PANEL
Anion gap: 10 (ref 5–15)
BUN: 49 mg/dL — ABNORMAL HIGH (ref 8–23)
CO2: 23 mmol/L (ref 22–32)
Calcium: 8.3 mg/dL — ABNORMAL LOW (ref 8.9–10.3)
Chloride: 106 mmol/L (ref 98–111)
Creatinine, Ser: 1.22 mg/dL — ABNORMAL HIGH (ref 0.44–1.00)
GFR calc Af Amer: 48 mL/min — ABNORMAL LOW (ref >60)
GFR calc non Af Amer: 41 mL/min — ABNORMAL LOW (ref >60)
Glucose, Bld: 249 mg/dL — ABNORMAL HIGH (ref 70–99)
Potassium: 4.6 mmol/L (ref 3.5–5.1)
Sodium: 139 mmol/L (ref 135–145)

## 2019-12-13 LAB — MRSA PCR SCREENING: MRSA by PCR: NEGATIVE

## 2019-12-13 LAB — MAGNESIUM
Magnesium: 1.9 mg/dL (ref 1.7–2.4)
Magnesium: 1.9 mg/dL (ref 1.7–2.4)
Magnesium: 1.9 mg/dL (ref 1.7–2.4)

## 2019-12-13 LAB — PHOSPHORUS
Phosphorus: 2.3 mg/dL — ABNORMAL LOW (ref 2.5–4.6)
Phosphorus: 2.7 mg/dL (ref 2.5–4.6)
Phosphorus: 3.3 mg/dL (ref 2.5–4.6)

## 2019-12-13 LAB — URINE CULTURE: Culture: NO GROWTH

## 2019-12-13 LAB — GLUCOSE, CAPILLARY
Glucose-Capillary: 228 mg/dL — ABNORMAL HIGH (ref 70–99)
Glucose-Capillary: 264 mg/dL — ABNORMAL HIGH (ref 70–99)
Glucose-Capillary: 235 mg/dL — ABNORMAL HIGH (ref 70–99)
Glucose-Capillary: 159 mg/dL — ABNORMAL HIGH (ref 70–99)
Glucose-Capillary: 184 mg/dL — ABNORMAL HIGH (ref 70–99)

## 2019-12-13 LAB — PROCALCITONIN: Procalcitonin: 0.41 ng/mL

## 2019-12-13 MED ORDER — DEXAMETHASONE SODIUM PHOSPHATE 10 MG/ML IJ SOLN
6.0000 mg | INTRAMUSCULAR | Status: AC
  Administered 2019-12-13 – 2019-12-21 (×9): 6 mg via INTRAVENOUS
  Filled 2019-12-13 (×10): qty 1

## 2019-12-13 MED ORDER — PROPRANOLOL HCL 10 MG PO TABS
10.0000 mg | ORAL_TABLET | Freq: Two times a day (BID) | ORAL | Status: DC
  Administered 2019-12-13 – 2019-12-19 (×13): 10 mg via ORAL
  Filled 2019-12-13 (×15): qty 1

## 2019-12-13 MED ORDER — PRO-STAT SUGAR FREE PO LIQD
30.0000 mL | Freq: Every day | ORAL | Status: DC
  Administered 2019-12-14 – 2019-12-27 (×14): 30 mL
  Filled 2019-12-13 (×14): qty 30

## 2019-12-13 MED ORDER — PIVOT 1.5 CAL PO LIQD
1000.0000 mL | ORAL | Status: DC
  Administered 2019-12-13 – 2019-12-27 (×10): 1000 mL

## 2019-12-13 MED ORDER — FAMOTIDINE IN NACL 20-0.9 MG/50ML-% IV SOLN
20.0000 mg | INTRAVENOUS | Status: DC
  Administered 2019-12-14 – 2019-12-20 (×7): 20 mg via INTRAVENOUS
  Filled 2019-12-13 (×7): qty 50

## 2019-12-13 MED ORDER — SODIUM CHLORIDE 0.9 % IV SOLN
750.0000 mg | Freq: Two times a day (BID) | INTRAVENOUS | Status: DC
  Administered 2019-12-14 (×2): 750 mg via INTRAVENOUS
  Filled 2019-12-13 (×3): qty 7.5

## 2019-12-13 MED ORDER — APIXABAN 5 MG PO TABS
5.0000 mg | ORAL_TABLET | Freq: Two times a day (BID) | ORAL | Status: DC
  Administered 2019-12-13 – 2019-12-25 (×24): 5 mg
  Filled 2019-12-13 (×24): qty 1

## 2019-12-13 NOTE — Progress Notes (Signed)
Inpatient Diabetes Program Recommendations  AACE/ADA: New Consensus Statement on Inpatient Glycemic Control   Target Ranges:  Prepandial:   less than 140 mg/dL      Peak postprandial:   less than 180 mg/dL (1-2 hours)      Critically ill patients:  140 - 180 mg/dL   Results for LUSIA, COFFEL (MRN [id number]) as of 12/13/2019 13:15  Ref. Range 11/27/2019 12:21 12/04/2019 16:36 12/13/2019 19:52 12/16/2019 23:28 12/13/2019 04:43 12/13/2019 07:52 12/13/2019 11:24  Glucose-Capillary Latest Ref Range: 70 - 99 mg/dL 215 (H) 238 (H) 215 (H) 197 (H) 228 (H) 264 (H) 235 (H)  Results for RAIDYN, VILLADA (MRN [id number]) as of 12/13/2019 13:15  Ref. Range 10/06/2017 04:47 12/10/2019 17:05  Hemoglobin A1C Latest Ref Range: 4.8 - 5.6 % 6.5 (H) 8.0 (H)   Review of Glycemic Control  Diabetes history: No Outpatient Diabetes medications: NA; noted Prednisone 40 mg daily on home med list Current orders for Inpatient glycemic control: Novolog 0-15 units Q4H; Decadron 6 mg Q24H  Inpatient Diabetes Program Recommendations:    Insulin-If steroids are continued, please consider ordering Levemir 10 units BID.  A1C: A1C 8% on 11/22/2019 indicating an average glucose of 183 mg/dl over the past 2-3 months. Per ADA, if A1C is 6.5% or greater then meets criteria to dx with DM. Noted patient was inpatient 11/27/19 to 12/03/19 and was ordered Prednisone 40 mg daily and also discharged on Prednisone 40 mg daily. Recent steroid use likely impacting A1C results. MD, please note if patient will be newly dx with DM during this admission.  NOTE: Patient from SNF with COVID Pneumonia and currently intubated and on ventilator. Patient was inpatient 11/27/19 to 12/03/19 and was ordered Prednisone 40 mg daily and also discharged on Prednisone 40 mg daily on 12/03/19. Recent steroid use likely impacting A1C results.   Thanks, [name] Alderman, RN, MSN, CDE Diabetes Coordinator Inpatient Diabetes Program [phone] (Team Pager from 8am to  5pm)

## 2019-12-13 NOTE — Progress Notes (Signed)
Initial Nutrition Assessment  RD working remotely.   DOCUMENTATION CODES:   Obesity unspecified  INTERVENTION:   Change TF via OG tube to Pivot 1.5 Cal at goal rate of 50 ml/h (1200 ml per day) and Prostat 30 ml once daily to provide 1900 kcals, 127 gm protein, 910.8 ml free water daily.  Continue 225mL free water flushes Q4, providing a total of 2110.29mL free water per day.  NUTRITION DIAGNOSIS:   Inadequate oral intake related to inability to eat as evidenced by NPO status.   GOAL:   Patient will meet greater than or equal to 90% of their needs   MONITOR:   Vent status, TF tolerance, Weight trends, Labs, I & O's  REASON FOR ASSESSMENT:   Consult Enteral/tube feeding initiation and management  ASSESSMENT:   Pt with a PMH significant for COPD, CAD, HLD, HTN, GERD, OA, and recently dx with Covid-19 PNA brought into ED hypoxic and combative and required intubation.   1/24 - pt intubated  Medications reviewed and include: Decadron, SSI, Pepcid  Labs reviewed: CBGs 197-264  UOP: 747mL x 24 hours I/O: +3,564.26mL since admit   Patient is currently intubated on ventilator support MV: 12 L/min Temp (24hrs), Avg:99.5 F (37.5 C), Min:96.4 F (35.8 C), Max:101.1 F (38.4 C)  Pt currently receiving TF via OG tube with Vital High Protein at rate of 40 ml/h (960 ml per day), Prostat 30 ml BID, and free water 237mL Q4 to provide 1,160 kcals, 114 gm protein, 2002.54 ml free water daily.   Diet Order:   Diet Order    None      EDUCATION NEEDS:   Not appropriate for education at this time  Skin:  Skin Assessment: Reviewed RN Assessment  Last BM:  12/02/2019  Height:   Ht Readings from Last 1 Encounters:  12/13/19 5\' 4"  (1.626 m)    Weight:   Wt Readings from Last 6 Encounters:  12/13/19 100.5 kg  12/11/2019 100.2 kg  12/03/19 98.2 kg  08/05/19 101.2 kg  04/06/19 90.7 kg  12/29/18 92.1 kg     Ideal Body Weight:  54.54 kg  BMI:  Body mass index is  38.03 kg/m.  Estimated Nutritional Needs:   Kcal:  1800-2000  Protein:  120-135 grams  Fluid:  >1.8L/d   Larkin Ina, MS, RD, LDN Pager: [phone] Weekend/After Hours Pager: [phone]

## 2019-12-13 NOTE — Progress Notes (Signed)
Gold colored plain band ring removed from left pinky finger and given to Arelia Sneddon, RN to be sent to security.

## 2019-12-13 NOTE — Progress Notes (Signed)
NAME:  [name], MRN:  [id number], DOB:  [birth year], LOS: 1 ADMISSION DATE:  12/15/2019, CONSULTATION DATE:   December 12, 2019 REFERRING MD: Dr. Owens Shark, CHIEF COMPLAINT:  Brought in by EMS confused, hypoxemic  Brief History   85 year old female recently diagnosed with Covid pneumonia brought to the [facility] emergency room combative hypoxemic on January 24 from a nursing home.  Required intubation, transferred to [facility] for further management.  History of present illness   85 year old female recently diagnosed with Covid pneumonia brought to the [place] emergency room combative hypoxemic on January 24 from a nursing home.  Required intubation, transferred to [facility] for further management. She was intubated upon arrival and cannot provide further history.  By chart review it seems that she takes Eliquis for atrial fibrillation.  Past Medical History  Squamous cell carcinoma of the nose COPD on 2 L oxygen Coronary artery disease Hyperlipidemia Hypertension GERD Osteoarthritis  Significant Hospital Events   1/24 intubation/admit  Consults:    Procedures:  1/24 ETT >   Significant Diagnostic Tests:  1/8 cth: No acute intracranial abnormality.  Findings consistent with age related atrophy and chronic small vessel ischemia  Small unchanged lacunar infarct in the left basal ganglia. 1/9 MRI brain: 1. Severely motion degraded examination. 2. No acute infarct identified. 3. Chronic left basal ganglia lacunar infarcts. 1/11 ECHO:  LVEF 0000000 Grade 1 diastolic dysfunction    Micro Data:  1/24 POC SARS CoV2 > positive 1/24 urine: ngtd 1/24 blood: ngtd  Antimicrobials:  1/24 decadron >  1/24 remdesivir >   Interim history/subjective:  1/25: remains intubated sedated. Some agitation reported overnight. On precedex infusion. afib with rvr overnight but now rate controlled.   Objective   Blood pressure (!) 139/96, pulse 64, temperature 99.9  F (37.7 C), resp. rate (!) 28, height 5\' 4"  (1.626 m), weight 100.5 kg, SpO2 94 %.    Vent Mode: PRVC FiO2 (%):  [50 %-80 %] 60 % Set Rate:  [28 bmp] 28 bmp Vt Set:  [430 mL] 430 mL PEEP:  [10 cmH20] 10 cmH20 Plateau Pressure:  [22 cmH20-23 cmH20] 22 cmH20   Intake/Output Summary (Last 24 hours) at 12/13/2019 [id number] Last data filed at 12/13/2019 0700 Gross per 24 hour  Intake 3702.69 ml  Output 690 ml  Net 3012.69 ml   Filed Weights   11/24/2019 1230 12/13/19 0315  Weight: 100.2 kg 100.5 kg    Examination:  General:  In bed on vent HENT: NCAT PERRLA, ETT in place PULM: CTA B, vent supported breathing CV: RRR, no mgr, radial pulses intact, cap refill normal GI: BS+, soft, nontender MSK: normal bulk and tone Neuro: sedated on vent    Resolved Hospital Problem list     Assessment & Plan:  COVID pneumonia causing severe acute respiratory failure Cont ICU care Continue mechanical ventilation per ARDS protocol Target TVol 6-8cc/kgIBW Target Plateau Pressure < 30cm H20 Target driving pressure less than 15 cm of water Target PaO2 55-65: titrate PEEP/FiO2 per protocol As long as PaO2 to FiO2 ratio is less than 1:150 position in prone position for 16 hours a day Check CVP daily if CVL in place Target CVP less than 4, diurese as necessary Ventilator associated pneumonia prevention protocol Decadron 10 days remdesivir 5 days Family says that she has never had TB or viral hepatitis, so will start baricitinib   Sepsis in setting of COVID infection: lactic acid normal -hemodynamically stable -tmax 102.1  Hypotension in setting of  propfol use -tolerating precedex gtt -prn versed  Need for sedation for mechanical ventilation Acute metabolic encephalopathy due to hypoxemia from COVID -cont PAD protocol: precedex, fentanyl infusion, prn versed  AKI CKD3a: -improving, baseline Cr 1.1 Monitor BMET and UOP Replace electrolytes as needed  CAD Tele Continue statin Hold  home nitrate for now  Essential Hypertension currently normotensive Hold home amlodipine  Atrial fib Eliquis  DM2 with Hyperglycemia: A1c: 8 -ssi  Thrombocytopenia:  -follow values likely 2/2 acute illness  CODE STATUS: Son says that she is DNR  Best practice:  Diet: tube feeding and free water Pain/Anxiety/Delirium protocol (if indicated): yes, as above VAP protocol (if indicated): yes DVT prophylaxis: Eliquis GI prophylaxis: famotidine Glucose control: SSI q4h accuchecks Mobility: bed rest Code Status: DNR Family Communication: I updated her son by phone on 1/25 Disposition: remain in ICU  Labs   CBC: Recent Labs  Lab 12/15/2019 0634 11/26/2019 1316 12/11/2019 1330 12/13/19 0604  WBC 9.9  --  8.8 5.2  NEUTROABS 9.1*  --  8.0*  --   HGB 12.6 12.9 13.0 13.4  HCT 42.5 38.0 44.9 43.1  MCV 103.9*  --  103.7* 98.9  PLT 173  --  158 133*    Basic Metabolic Panel: Recent Labs  Lab 11/23/2019 0634 11/27/2019 1316 12/03/2019 1330 12/04/2019 1705 12/13/19 0604  NA 142 140 141  --  139  K 5.2* 4.6 4.7  --  4.6  CL 98  --  103  --  106  CO2 33*  --  26  --  23  GLUCOSE 161*  --  205*  --  249*  BUN 32*  --  34*  --  49*  CREATININE 1.51*  --  1.42*  --  1.22*  CALCIUM 8.4*  --  8.0*  --  8.3*  MG  --   --  1.9 1.9 1.9  PHOS  --   --  2.2* 2.3* 2.7   GFR: Estimated Creatinine Clearance: 41 mL/min (A) (by C-G formula based on SCr of 1.22 mg/dL (H)). Recent Labs  Lab 12/13/2019 0634 12/16/2019 0636 11/24/2019 0900 12/06/2019 1330 12/13/19 0604  WBC 9.9  --   --  8.8 5.2  LATICACIDVEN  --  2.5* 1.1  --   --     Liver Function Tests: Recent Labs  Lab 12/11/2019 0634 12/10/2019 1330  AST 25 29  ALT 25 28  ALKPHOS 49 48  BILITOT 0.9 1.0  PROT 6.2* 6.0*  ALBUMIN 3.1* 3.1*   No results for input(s): LIPASE, AMYLASE in the last 168 hours. No results for input(s): AMMONIA in the last 168 hours.  ABG    Component Value Date/Time   PHART 7.373 11/20/2019 1316   PCO2ART  43.4 12/15/2019 1316   PO2ART 86.0 12/04/2019 1316   HCO3 25.3 12/15/2019 1316   TCO2 27 12/04/2019 1316   O2SAT 96.0 12/17/2019 1316     Coagulation Profile: Recent Labs  Lab 12/11/2019 0634  INR 1.4*    Cardiac Enzymes: No results for input(s): CKTOTAL, CKMB, CKMBINDEX, TROPONINI in the last 168 hours.  HbA1C: Hgb A1c MFr Bld  Date/Time Value Ref Range Status  11/20/2019 05:05 PM 8.0 (H) 4.8 - 5.6 % Final    Comment:    (NOTE) Pre diabetes:          5.7%-6.4% Diabetes:              >6.4% Glycemic control for   <7.0% adults with diabetes  10/06/2017 04:47 AM 6.5 (H) 4.8 - 5.6 % Final    Comment:    (NOTE) Pre diabetes:          5.7%-6.4% Diabetes:              >6.4% Glycemic control for   <7.0% adults with diabetes     CBG: Recent Labs  Lab 11/27/2019 1221 12/16/2019 1636 12/11/2019 1952 12/01/2019 2328 12/13/19 0443  GLUCAP 215* 238* 215* 197* 228*       Critical care time: 41 minutes    Critical care time: The patient is critically ill with multiple organ systems failure and requires high complexity decision making for assessment and support, frequent evaluation and titration of therapies, application of advanced monitoring technologies and extensive interpretation of multiple databases.  Critical care time 41 mins. This represents my time independent of the NPs time taking care of the pt. This is excluding procedures.    [facility] 12/13/2019, 7:33 AM

## 2019-12-13 NOTE — Progress Notes (Signed)
[facility] Progress Note Patient Name: [name] DOB: 11-08-1935 MRN: [id number]   Date of Service  12/13/2019  HPI/Events of Note  Pt with generalized jerking body movements suspicious for seizures, a prior episode was extinguished by 2 mg of Versed iv, EEG  Is pending.  eICU Interventions  Versed 2 mg iv now for active movements, Keppra 750 mg iv Q 12 hours with first dose stat, follow up EEG results when available, Pt may also need neurology evaluation, recent CT and MRI without obvious potential cause but the MRI was a sub-optimal study.        Kerry Kass Sriram Febles 12/13/2019, 10:50 PM

## 2019-12-13 NOTE — Progress Notes (Signed)
[place] for apixaban Indication: atrial fibrillation  Allergies  Allergen Reactions  . Iohexol Other (See Comments)    Code:  HIVES, Desc:  PER ROBIN @ [facility], PT IS ALLERGIC TO IVP DYE 08/28/10 RM  . Lipitor [Atorvastatin Calcium] Hives    Patient Measurements: Actual body weight: 100.2 kg   Vital Signs: Temp: 100.7 F (38.2 C) (01/25 1325) Temp Source: Oral (01/25 1325) BP: 146/90 (01/25 1430) Pulse Rate: 76 (01/25 1430)  Labs: Recent Labs    12/17/2019 0634 11/21/2019 0634 12/07/2019 0636 11/22/2019 1316 12/14/2019 1316 12/18/2019 1330 12/13/19 0604  HGB 12.6   < >  --  12.9   < > 13.0 13.4  HCT 42.5   < >  --  38.0  --  44.9 43.1  PLT 173  --   --   --   --  158 133*  APTT 39*  --   --   --   --   --   --   LABPROT 17.4*  --   --   --   --   --   --   INR 1.4*  --   --   --   --   --   --   CREATININE 1.51*  --   --   --   --  1.42* 1.22*  TROPONINIHS  --   --  46*  --   --   --   --    < > = values in this interval not displayed.    Estimated Creatinine Clearance: 41 mL/min (A) (by C-G formula based on SCr of 1.22 mg/dL (H)).   Medications:  . apixaban  2.5 mg Per Tube BID  . baricitinib  2 mg Oral Daily  . chlorhexidine gluconate (MEDLINE KIT)  15 mL Mouth Rinse BID  . Chlorhexidine Gluconate Cloth  6 each Topical Daily  . dexamethasone (DECADRON) injection  6 mg Intravenous Q24H  . feeding supplement (PRO-STAT SUGAR FREE 64)  30 mL Per Tube BID  . feeding supplement (VITAL HIGH PROTEIN)  1,000 mL Per Tube Q24H  . fentaNYL (SUBLIMAZE) injection  25 mcg Intravenous Once  . free water  200 mL Per Tube Q4H  . insulin aspart  0-15 Units Subcutaneous Q4H  . mouth rinse  15 mL Mouth Rinse 10 times per day  . pravastatin  40 mg Oral Daily  . propranolol  10 mg Oral BID     Assessment: 12 YOF with COVID-19 pneumonia was recently started on apixaban 5 mg twice daily for AFib and high CHADSVASC score. Pharmacy consulted to resume  apixaban. H/H wnl and Plt decreased to 133k. SCr is elevated but improving (1.51>1.22).  Will resume full dose apixaban.  Goal of Therapy:  Primary stroke prevention Monitor platelets by anticoagulation protocol: Yes   Plan:  -Apixaban 5 mg twice daily -Monitor renal fx, CBC and s/s of bleeding  Gretta Arab PharmD, BCPS Clinical pharmacist phone 7am- 5pm: [phone] 12/13/2019 2:58 PM

## 2019-12-13 NOTE — Progress Notes (Signed)
Agitation with positioning at this time. Precedex drip titrated up. Afib rate 127 with agitation. Will report to oncoming RN.

## 2019-12-14 ENCOUNTER — Inpatient Hospital Stay (HOSPITAL_COMMUNITY)
Admission: AD | Admit: 2019-12-14 | Discharge: 2019-12-14 | Disposition: A | Discharge status: Home / Self Care | Payer: Medicare Other | Source: Other Acute Inpatient Hospital | Attending: Critical Care Medicine | Admitting: Critical Care Medicine

## 2019-12-14 ENCOUNTER — Inpatient Hospital Stay (HOSPITAL_COMMUNITY): Payer: Medicare Other

## 2019-12-14 ENCOUNTER — Inpatient Hospital Stay: Payer: Self-pay

## 2019-12-14 DIAGNOSIS — R4182 Altered mental status, unspecified: Secondary | ICD-10-CM

## 2019-12-14 DIAGNOSIS — J9601 Acute respiratory failure with hypoxia: Secondary | ICD-10-CM

## 2019-12-14 LAB — CBC
HCT: 46.2 % — ABNORMAL HIGH (ref 36.0–46.0)
Hemoglobin: 14.1 g/dL (ref 12.0–15.0)
MCH: 30.3 pg (ref 26.0–34.0)
MCHC: 30.5 g/dL (ref 30.0–36.0)
MCV: 99.4 fL (ref 80.0–100.0)
Platelets: 145 10*3/uL — ABNORMAL LOW (ref 150–400)
RBC: 4.65 MIL/uL (ref 3.87–5.11)
RDW: 13.1 % (ref 11.5–15.5)
WBC: 11.2 10*3/uL — ABNORMAL HIGH (ref 4.0–10.5)
nRBC: 0 % (ref 0.0–0.2)

## 2019-12-14 LAB — COMPREHENSIVE METABOLIC PANEL
ALT: 33 U/L (ref 0–44)
AST: 26 U/L (ref 15–41)
Albumin: 2.9 g/dL — ABNORMAL LOW (ref 3.5–5.0)
Alkaline Phosphatase: 54 U/L (ref 38–126)
Anion gap: 10 (ref 5–15)
BUN: 60 mg/dL — ABNORMAL HIGH (ref 8–23)
CO2: 25 mmol/L (ref 22–32)
Calcium: 8.4 mg/dL — ABNORMAL LOW (ref 8.9–10.3)
Chloride: 107 mmol/L (ref 98–111)
Creatinine, Ser: 1.23 mg/dL — ABNORMAL HIGH (ref 0.44–1.00)
GFR calc Af Amer: 47 mL/min — ABNORMAL LOW (ref >60)
GFR calc non Af Amer: 41 mL/min — ABNORMAL LOW (ref >60)
Glucose, Bld: 239 mg/dL — ABNORMAL HIGH (ref 70–99)
Potassium: 4.6 mmol/L (ref 3.5–5.1)
Sodium: 142 mmol/L (ref 135–145)
Total Bilirubin: 0.3 mg/dL (ref 0.3–1.2)
Total Protein: 6.2 g/dL — ABNORMAL LOW (ref 6.5–8.1)

## 2019-12-14 LAB — GLUCOSE, CAPILLARY
Glucose-Capillary: 176 mg/dL — ABNORMAL HIGH (ref 70–99)
Glucose-Capillary: 221 mg/dL — ABNORMAL HIGH (ref 70–99)
Glucose-Capillary: 245 mg/dL — ABNORMAL HIGH (ref 70–99)
Glucose-Capillary: 252 mg/dL — ABNORMAL HIGH (ref 70–99)
Glucose-Capillary: 276 mg/dL — ABNORMAL HIGH (ref 70–99)
Glucose-Capillary: 332 mg/dL — ABNORMAL HIGH (ref 70–99)

## 2019-12-14 LAB — C-REACTIVE PROTEIN: CRP: 13.4 mg/dL — ABNORMAL HIGH (ref <1.0)

## 2019-12-14 LAB — D-DIMER, QUANTITATIVE: D-Dimer, Quant: 0.94 ug/mL-FEU — ABNORMAL HIGH (ref 0.00–0.50)

## 2019-12-14 LAB — MAGNESIUM: Magnesium: 2 mg/dL (ref 1.7–2.4)

## 2019-12-14 LAB — PHOSPHORUS: Phosphorus: 2.9 mg/dL (ref 2.5–4.6)

## 2019-12-14 LAB — PROCALCITONIN: Procalcitonin: 0.37 ng/mL

## 2019-12-14 MED ORDER — MIDAZOLAM HCL 2 MG/2ML IJ SOLN
4.0000 mg | Freq: Once | INTRAMUSCULAR | Status: AC
  Administered 2019-12-14: 4 mg via INTRAMUSCULAR
  Filled 2019-12-14: qty 4

## 2019-12-14 MED ORDER — INSULIN DETEMIR 100 UNIT/ML ~~LOC~~ SOLN
10.0000 [IU] | Freq: Every day | SUBCUTANEOUS | Status: DC
  Administered 2019-12-14 – 2019-12-19 (×6): 10 [IU] via SUBCUTANEOUS
  Filled 2019-12-14 (×6): qty 0.1

## 2019-12-14 MED ORDER — INSULIN ASPART 100 UNIT/ML ~~LOC~~ SOLN
0.0000 [IU] | SUBCUTANEOUS | Status: DC
  Administered 2019-12-14: 7 [IU] via SUBCUTANEOUS
  Administered 2019-12-14: 15 [IU] via SUBCUTANEOUS
  Administered 2019-12-15: 11 [IU] via SUBCUTANEOUS
  Administered 2019-12-15: 7 [IU] via SUBCUTANEOUS
  Administered 2019-12-15 (×2): 4 [IU] via SUBCUTANEOUS
  Administered 2019-12-15 (×3): 11 [IU] via SUBCUTANEOUS
  Administered 2019-12-16: 23:00:00 4 [IU] via SUBCUTANEOUS
  Administered 2019-12-16: 12:00:00 11 [IU] via SUBCUTANEOUS
  Administered 2019-12-16: 17:00:00 4 [IU] via SUBCUTANEOUS
  Administered 2019-12-16: 7 [IU] via SUBCUTANEOUS
  Administered 2019-12-16: 20:00:00 15 [IU] via SUBCUTANEOUS
  Administered 2019-12-16: 05:00:00 4 [IU] via SUBCUTANEOUS
  Administered 2019-12-17 (×2): 11 [IU] via SUBCUTANEOUS
  Administered 2019-12-17: 04:00:00 4 [IU] via SUBCUTANEOUS
  Administered 2019-12-17: 08:00:00 3 [IU] via SUBCUTANEOUS
  Administered 2019-12-17 – 2019-12-18 (×2): 7 [IU] via SUBCUTANEOUS
  Administered 2019-12-18 (×2): 4 [IU] via SUBCUTANEOUS
  Administered 2019-12-18 (×2): 7 [IU] via SUBCUTANEOUS
  Administered 2019-12-18: 15 [IU] via SUBCUTANEOUS
  Administered 2019-12-18: 3 [IU] via SUBCUTANEOUS
  Administered 2019-12-19 (×3): 4 [IU] via SUBCUTANEOUS
  Administered 2019-12-19 (×3): 7 [IU] via SUBCUTANEOUS
  Administered 2019-12-20 (×3): 4 [IU] via SUBCUTANEOUS
  Administered 2019-12-20 – 2019-12-21 (×2): 11 [IU] via SUBCUTANEOUS
  Administered 2019-12-21: 7 [IU] via SUBCUTANEOUS
  Administered 2019-12-21: 0 [IU] via SUBCUTANEOUS
  Administered 2019-12-21: 4 [IU] via SUBCUTANEOUS
  Administered 2019-12-21: 7 [IU] via SUBCUTANEOUS
  Administered 2019-12-22 (×2): 4 [IU] via SUBCUTANEOUS
  Administered 2019-12-23: 3 [IU] via SUBCUTANEOUS
  Administered 2019-12-23: 4 [IU] via SUBCUTANEOUS
  Administered 2019-12-23: 3 [IU] via SUBCUTANEOUS
  Administered 2019-12-24 (×3): 4 [IU] via SUBCUTANEOUS
  Administered 2019-12-25: 7 [IU] via SUBCUTANEOUS
  Administered 2019-12-25: 11 [IU] via SUBCUTANEOUS
  Administered 2019-12-25: 7 [IU] via SUBCUTANEOUS
  Administered 2019-12-25 – 2019-12-26 (×3): 4 [IU] via SUBCUTANEOUS
  Administered 2019-12-26: 3 [IU] via SUBCUTANEOUS
  Administered 2019-12-26: 4 [IU] via SUBCUTANEOUS
  Administered 2019-12-26: 3 [IU] via SUBCUTANEOUS
  Administered 2019-12-26 – 2019-12-27 (×2): 7 [IU] via SUBCUTANEOUS
  Administered 2019-12-27: 3 [IU] via SUBCUTANEOUS
  Administered 2019-12-27: 4 [IU] via SUBCUTANEOUS
  Administered 2019-12-27: 3 [IU] via SUBCUTANEOUS

## 2019-12-14 NOTE — Progress Notes (Addendum)
Communicated to physician after patients IV's all lost adhesive to skin and were dislodged.  Called charge RN to help with IV placement.  Seven attempts were made to place IV.  No success.  Procured order from Physician for PICC line placement.  Physician stated unless patient becomes hemodynamically unstable, she does not think the patient needs any benzodiazepines d/t earlier EEG results presenting negative for seizure.  Called Son Cletus Gash to obtain consent for PICC placement.  Explained science of PICC therapy and necessity for IV procedure.

## 2019-12-14 NOTE — Progress Notes (Signed)
eLink Physician-Brief Progress Note Patient Name: [name] DOB: 08/14/1935 MRN: [id number]   Date of Service  12/14/2019  HPI/Events of Note  Pt with frequent watery stools  eICU Interventions  Order for [place] entered.        Kerry Kass Quillan Whitter 12/14/2019, 11:59 PM

## 2019-12-14 NOTE — Progress Notes (Signed)
Around this time, while repositioning pt, noted pt to be exhibiting jerking like movements which resembled seizure activity.  Elink was called for orders, orders were already available.  Pt was given 16mcq of fentanyl with no response followed by 1mg  versed, jerking subsided.  Will continue to monitor.

## 2019-12-14 NOTE — Progress Notes (Signed)
EEG complete - results pending 

## 2019-12-14 NOTE — Progress Notes (Signed)
NAME:  [name], MRN:  [id number], DOB:  1935-05-04, LOS: 2 ADMISSION DATE:  12/17/2019, CONSULTATION DATE:   December 12, 2019 REFERRING MD: Dr. Owens Shark, CHIEF COMPLAINT:  Brought in by EMS confused, hypoxemic  Brief History   85 year old female recently diagnosed with Covid pneumonia brought to the [facility] emergency room combative hypoxemic on January 24 from a nursing home.  Required intubation, transferred to [facility] for further management.  History of present illness   85 year old female recently diagnosed with Covid pneumonia brought to the [facility] emergency room combative hypoxemic on January 24 from a nursing home.  Required intubation, transferred to [facility] for further management. She was intubated upon arrival and cannot provide further history.  By chart review it seems that she takes Eliquis for atrial fibrillation.  Past Medical History  Squamous cell carcinoma of the nose COPD on 2 L oxygen Coronary artery disease Hyperlipidemia Hypertension GERD Osteoarthritis  Significant Hospital Events   1/24 intubation/admit  Consults:    Procedures:  1/24 ETT >   Significant Diagnostic Tests:  1/8 cth: No acute intracranial abnormality.  Findings consistent with age related atrophy and chronic small vessel ischemia  Small unchanged lacunar infarct in the left basal ganglia. 1/9 MRI brain: 1. Severely motion degraded examination. 2. No acute infarct identified. 3. Chronic left basal ganglia lacunar infarcts. 1/11 ECHO:  LVEF 0000000 Grade 1 diastolic dysfunction  [id number] eeg: pending    Micro Data:  1/24 POC SARS CoV2 > positive 1/24 urine: ngtd 1/24 blood: ngtd  Antimicrobials:  1/24 decadron >  1/24 remdesivir >  1/24 baricitinib->  Interim history/subjective:  1/26:  Pt with tremulous/jerking-like motion when sedation weaned. Family states has baseline tremor for which she is on propanolol. This was restarted yesterday late  morning and eeg ordered. Overnight still with episodes so was started on keppra and prn versed. eeg still pending. tmax 102.7 overnight. On 50%/10 on PRVC. All labs still pending this am 1/25: remains intubated sedated. Some agitation reported overnight. On precedex infusion. afib with rvr overnight but now rate controlled.   Objective   Blood pressure (!) 152/102, pulse 73, temperature (!) 102.7 F (39.3 C), resp. rate (!) 28, height 5\' 4"  (1.626 m), weight 100.5 kg, SpO2 95 %.    Vent Mode: PRVC FiO2 (%):  [40 %-100 %] 50 % Set Rate:  [28 bmp] 28 bmp Vt Set:  [430 mL] 430 mL PEEP:  [10 cmH20] 10 cmH20 Plateau Pressure:  [21 cmH20-25 cmH20] 25 cmH20   Intake/Output Summary (Last 24 hours) at 12/14/2019 [id number] Last data filed at 12/14/2019 0600 Gross per 24 hour  Intake 3428.79 ml  Output 1710 ml  Net 1718.79 ml   Filed Weights   11/26/2019 1230 12/13/19 0315  Weight: 100.2 kg 100.5 kg    Examination:  General:  In bed on vent HENT: NCAT PERRLA, ETT in place PULM: CTA B, vent supported breathing CV: irreg, no mgr, radial pulses intact, cap refill normal GI: BS+, soft, nontender MSK: normal bulk and tone Neuro: sedated on vent    Resolved Hospital Problem list     Assessment & Plan:  COVID pneumonia causing severe acute respiratory failure Cont ICU care Continue mechanical ventilation per ARDS protocol Target TVol 6-8cc/kgIBW Target Plateau Pressure < 30cm H20 Target driving pressure less than 15 cm of water Target PaO2 55-65: titrate PEEP/FiO2 per protocol As long as PaO2 to FiO2 ratio is less than 1:150 position in prone position for 16  hours a day Check CVP daily if CVL in place Target CVP less than 4, diurese as necessary Ventilator associated pneumonia prevention protocol Decadron 10 days remdesivir 5 days baricitinib 14 days   Sepsis in setting of COVID infection: lactic acid normal -hemodynamically stable -tmax 102.7 -pct increasing to 0.41 -will send  resp cx  Hypotension in setting of propfol use -tolerating precedex gtt -prn versed  Need for sedation for mechanical ventilation Acute metabolic encephalopathy due to hypoxemia from COVID -cont PAD protocol: precedex, fentanyl infusion, prn versed  AKI CKD3a: -improving, baseline Cr 1.1 Monitor BMET and UOP Replace electrolytes as needed -labs pending this am.   CAD Tele Continue statin Hold home nitrate for now HFpEF:  -if BP allows will attempt to diurese some today.  Essential Hypertension currently normotensive Hold home amlodipine  Atrial fib Eliquis  DM2 with Hyperglycemia: A1c: 8 -ssi  Thrombocytopenia:  -follow values likely 2/2 acute illness -labs pending this am.   ? Seizure like activity with baseline tremors:  -restarted propanolol on 1/25 -overnight keppra added -eeg pending.  -presenting cth/mri negative for acute process.   CODE STATUS: DNR  Best practice:  Diet: tube feeding and free water Pain/Anxiety/Delirium protocol (if indicated): yes, as above VAP protocol (if indicated): yes DVT prophylaxis: Eliquis GI prophylaxis: famotidine Glucose control: SSI q4h accuchecks Mobility: bed rest Code Status: DNR Family Communication: I updated her son by phone on 1/26 Disposition: remain in ICU  Labs   CBC: Recent Labs  Lab 11/19/2019 0634 12/19/2019 1316 12/17/2019 1330 12/13/19 0604  WBC 9.9  --  8.8 5.2  NEUTROABS 9.1*  --  8.0*  --   HGB 12.6 12.9 13.0 13.4  HCT 42.5 38.0 44.9 43.1  MCV 103.9*  --  103.7* 98.9  PLT 173  --  158 133*    Basic Metabolic Panel: Recent Labs  Lab 11/28/2019 0634 12/18/2019 1316 11/24/2019 1330 12/16/2019 1705 12/13/19 0604 12/13/19 1620  NA 142 140 141  --  139  --   K 5.2* 4.6 4.7  --  4.6  --   CL 98  --  103  --  106  --   CO2 33*  --  26  --  23  --   GLUCOSE 161*  --  205*  --  249*  --   BUN 32*  --  34*  --  49*  --   CREATININE 1.51*  --  1.42*  --  1.22*  --   CALCIUM 8.4*  --  8.0*  --  8.3*   --   MG  --   --  1.9 1.9 1.9 1.9  PHOS  --   --  2.2* 2.3* 2.7 3.3   GFR: Estimated Creatinine Clearance: 41 mL/min (A) (by C-G formula based on SCr of 1.22 mg/dL (H)). Recent Labs  Lab 11/20/2019 0634 11/24/2019 0636 11/27/2019 0900 11/23/2019 1330 12/13/19 0604  PROCALCITON  --   --   --   --  0.41  WBC 9.9  --   --  8.8 5.2  LATICACIDVEN  --  2.5* 1.1  --   --     Liver Function Tests: Recent Labs  Lab 11/26/2019 0634 12/15/2019 1330  AST 25 29  ALT 25 28  ALKPHOS 49 48  BILITOT 0.9 1.0  PROT 6.2* 6.0*  ALBUMIN 3.1* 3.1*   No results for input(s): LIPASE, AMYLASE in the last 168 hours. No results for input(s): AMMONIA in the last 168 hours.  ABG    Component Value Date/Time   PHART 7.373 11/21/2019 1316   PCO2ART 43.4 12/09/2019 1316   PO2ART 86.0 11/26/2019 1316   HCO3 25.3 11/22/2019 1316   TCO2 27 11/22/2019 1316   O2SAT 96.0 11/21/2019 1316     Coagulation Profile: Recent Labs  Lab 11/27/2019 0634  INR 1.4*    Cardiac Enzymes: No results for input(s): CKTOTAL, CKMB, CKMBINDEX, TROPONINI in the last 168 hours.  HbA1C: Hgb A1c MFr Bld  Date/Time Value Ref Range Status  12/09/2019 05:05 PM 8.0 (H) 4.8 - 5.6 % Final    Comment:    (NOTE) Pre diabetes:          5.7%-6.4% Diabetes:              >6.4% Glycemic control for   <7.0% adults with diabetes   10/06/2017 04:47 AM 6.5 (H) 4.8 - 5.6 % Final    Comment:    (NOTE) Pre diabetes:          5.7%-6.4% Diabetes:              >6.4% Glycemic control for   <7.0% adults with diabetes     CBG: Recent Labs  Lab 12/13/19 1638 12/13/19 2055 12/14/19 0002 12/14/19 0342 12/14/19 0717  GLUCAP 159* 184* 245* 176* 276*       Critical care time: 40 minutes    Critical care time: The patient is critically ill with multiple organ systems failure and requires high complexity decision making for assessment and support, frequent evaluation and titration of therapies, application of advanced monitoring  technologies and extensive interpretation of multiple databases.  Critical care time 40 mins. This represents my time independent of the NPs time taking care of the pt. This is excluding procedures.    [facility] 12/14/2019, 7:38 AM

## 2019-12-14 NOTE — Progress Notes (Addendum)
During EEG patient had full tonic clonic seizure.  Eyelids blinking with her pupils aimed towards her crown.  Her arms lifted from bed bilaterally and all of her extremities presented with extreme tremor.  Delivered 1mg  versed IV push and turned patient to her left side for comfort.  Assessed oral cavity for emesis.  None noted.

## 2019-12-14 NOTE — Procedures (Signed)
Patient Name: [name]  MRN: [id number]  Epilepsy Attending: Lora Havens  Referring Physician/Provider: Dr. [name] Date: 12/14/2019 Duration: 24.34 minutes  Patient history: 85 year old female recently diagnosed with Covid pneumonia was transferred from [facility] hospital for altered mental status.  EEG to evaluate for seizures.  Level of alertness: Comatose  AEDs during EEG study: Keppra, Versed  Technical aspects: This EEG study was done with scalp electrodes positioned according to the 10-20 International system of electrode placement. Electrical activity was acquired at a sampling rate of 500Hz  and reviewed with a high frequency filter of 70Hz  and a low frequency filter of 1Hz . EEG data were recorded continuously and digitally stored.   DESCRIPTION: EEG showed continuous generalized 2-3hz  theta-delta slowing. Triphasic waves, generalized, maximal bifrontal were also noted. Per eeg tech annotation, at [phone] patient had an episode where she was noted to have whole body twitching, predominantly "upper body" with eyes deviated upwards and clenching on ET tube after tactile stimulation, lasting about 5 minutes. Concomitant eeg before, during and after event didn't show any eeg change to suggest seizure. Hyperventilation and photic stimulation were not performed.  ABNORMALITY - Continuous slow, generalized - Triphasic waves, generalized   IMPRESSION: This study showed evidence of severe diffuse encephalopathy, non specific to etiology but could be secondary to toxic-metabolic etiologies.  One episode of whole body shaking with eyes deviated upwards, lasting about 5 minutes without eeg change was noted and is therefore not epileptic seizure. No seizures or epileptiform discharges were seen throughout the recording.  Fred Franzen Barbra Sarks

## 2019-12-14 NOTE — Progress Notes (Signed)
Results from eeg in and despite event seen during eeg no seizures noted.   Will d/c keppra at this time.  If persists will ask neuro to formally eval, although with intermittent nature doubtful would be much utility unless transferred for LTM.

## 2019-12-14 NOTE — Progress Notes (Signed)
Again, while repositioning pt, she began to have more jerking movements.  Elink nurse was called per their request to witness these episodes.  Elink nurse was able to see with the camera these movements, along with the doctors.  See orders. Will continue to monitor.

## 2019-12-14 NOTE — Progress Notes (Signed)
Patient is a 85 YO female transferred to [place] from [facility] regional after being intubated for Covid + saturation depression.  Upon assessment patient is not responsive to vocie or command.  She is receiving precedex 1.62mcg and fentanyl 29mcg drip per order.  EEG ordered for this morning; technician Nicki is at bedside prepping patient for EEG.  She related that ordering physician stated he wanted precedex stopped before test to have as little interference with EEG capture.  I looked up half-life of precedex on [employer] which stated for her geriatric age, precedex half-life was around 155 minutes.  Technician stated she needed about 40 minutes for patient preparation for test so I turned precedex off 0739.    Patient ETT at lip is 23cm receiving 50FiO2 PEEP 10 430 TV saturating 98% with a strong wave PLETH.  HR Sinus arrhythmia with flutter. B/P 172/90 (115).  No skin injuries to note.  No advantageous breath sounds notable.

## 2019-12-14 NOTE — Progress Notes (Signed)
Patient IV lines all removed from patient body d/t adhesive failure.  Contacted physician as patient was experiencing severe tremors.  Physician ordered 4mg  Versed IM.  Physician ordered central to be placed as emergent.  Placed by [name] DO. After placement a chest xray was obtained ad placement verified.  Patient is now receiving 69mcg/hr Fentanyl and 0.51mcg/hr precedex.  Vitals WDL.  HR in the 60's.  SBP 99.  RR 28.  No signs of distress.

## 2019-12-14 NOTE — Progress Notes (Signed)
Inpatient Diabetes Program Recommendations  AACE/ADA: New Consensus Statement on Inpatient Glycemic Control   Target Ranges:  Prepandial:   less than 140 mg/dL      Peak postprandial:   less than 180 mg/dL (1-2 hours)      Critically ill patients:  140 - 180 mg/dL   Results for [name], [name] (MRN [id number]) as of 12/14/2019 12:09  Ref. Range 12/13/2019 07:52 12/13/2019 11:24 12/13/2019 16:38 12/13/2019 20:55 12/14/2019 00:02 12/14/2019 03:42 12/14/2019 07:17  Glucose-Capillary Latest Ref Range: 70 - 99 mg/dL 264 (H) 235 (H) 159 (H) 184 (H) 245 (H) 176 (H) 276 (H)  Results for [name], [name] (MRN [id number]) as of 12/14/2019 12:09  Ref. Range 10/06/2017 04:47 11/30/2019 17:05  Hemoglobin A1C Latest Ref Range: 4.8 - 5.6 % 6.5 (H) 8.0 (H)   Review of Glycemic Control  Diabetes history: No Outpatient Diabetes medications: NA; noted Prednisone 40 mg daily on home med list Current orders for Inpatient glycemic control: Novolog 0-15 units Q4H; Decadron 6 mg Q24H  Inpatient Diabetes Program Recommendations:    Insulin-If steroids are continued, please consider ordering Levemir 5 units BID.  Insulin-Tube Feeding Coverage:  Please consider ordering Novolog 4 units Q4H for tube feeding coverage. If tube feeding is stopped or held then Novolog tube feeding coverage should also be stopped or held.  A1C: A1C 8% on 12/06/2019 indicating an average glucose of 183 mg/dl over the past 2-3 months. Per ADA, if A1C is 6.5% or greater then meets criteria to dx with DM. Noted patient was inpatient 11/27/19 to 12/03/19 and was ordered Prednisone 40 mg daily and also discharged on Prednisone 40 mg daily. Recent steroid use likely impacting A1C results. MD, please note if patient will be newly dx with DM during this admission.  NOTE: Patient from SNF with COVID Pneumonia and currently intubated on ventilator and ordered tube feeding. Patient was inpatient 11/27/19 to 12/03/19 and was ordered Prednisone 40 mg daily and also  discharged on Prednisone 40 mg daily on 12/03/19. Recent steroid use likely impacting A1C results.   Thanks, Barnie Alderman, RN, MSN, CDE Diabetes Coordinator Inpatient Diabetes Program [phone] (Team Pager from 8am to 5pm)

## 2019-12-14 NOTE — Progress Notes (Signed)
Provided patient with a cold water soap bath to cleanse and also help reduce her internal core temperature.  Currently 38.8.  Patient has been repositioned; mouth care; and is resting with no blankets.  During bathing patient experienced seizure like symptoms again.  Delivered 1mg  versed IV push and continued with bathing once tremors and shaking subsided.

## 2019-12-14 NOTE — Progress Notes (Signed)
Contacted patients Son [name] and explained patients plan of care with medical therapies including ventilator support.  Patients temperature has now decreased from 39.2 - 38.9c.  Delivered 650mg  tylenol crushed through the feeding tube this morning to alleviate patient of fever.  Patient is still receiving PIVOT 1.5 at 75ml hour.  Changed the tubing and feeding bag as planned per 24hr order.  UOP is WDL.  B/P 133/83.  HR mid 60's to high 70's.  A-fib to flutter.  Negative of seizure activity since this mornings assessment and EEG.  Turning patient q2hrs.  No B/M as of present.  Remdesivir dose delivered.

## 2019-12-14 NOTE — Procedures (Signed)
Central Venous Catheter Insertion Procedure Note [name] [id number] 23-Jul-1935  Procedure: Insertion of Central Venous Catheter Indications: no IV access  Procedure Details Consent: emergenty Time Out: Verified patient identification, verified procedure, site/side was marked, verified correct patient position, special equipment/implants available, medications/allergies/relevent history reviewed, required imaging and test results available.  Performed  Maximum sterile technique was used including antiseptics, cap, gloves, gown, hand hygiene, mask and sheet. Skin prep: Chlorhexidine; local anesthetic administered A antimicrobial bonded/coated triple lumen catheter was placed in the right internal jugular vein using the Seldinger technique.  Evaluation Blood flow good Complications: No apparent complications Patient did tolerate procedure well. Chest X-ray ordered to verify placement.  CXR: pending    Line was done under u/s guidance. Easily compressible vein noted with neighboring pulsatile artery. Upon stick dark red non pulsatile blood was noted. Wire easily advanced. Wire was verified to be in easily compressible/non pulsatile vessel in both cross sectional and longitudinal views under ultrasound guidance. Skin was easily dilated and catheter advanced without issue. Wire was withdrawn from vessel. No air was aspirated thru entirety of the procedure. Pt tolerated well. No complications were appreciated. [name] 12/14/2019, 6:37 PM

## 2019-12-15 LAB — CBC
HCT: 41.8 % (ref 36.0–46.0)
Hemoglobin: 12.7 g/dL (ref 12.0–15.0)
MCH: 30.3 pg (ref 26.0–34.0)
MCHC: 30.4 g/dL (ref 30.0–36.0)
MCV: 99.8 fL (ref 80.0–100.0)
Platelets: 138 10*3/uL — ABNORMAL LOW (ref 150–400)
RBC: 4.19 MIL/uL (ref 3.87–5.11)
RDW: 12.9 % (ref 11.5–15.5)
WBC: 8.9 10*3/uL (ref 4.0–10.5)
nRBC: 0.3 % — ABNORMAL HIGH (ref 0.0–0.2)

## 2019-12-15 LAB — COMPREHENSIVE METABOLIC PANEL
ALT: 35 U/L (ref 0–44)
AST: 32 U/L (ref 15–41)
Albumin: 2.4 g/dL — ABNORMAL LOW (ref 3.5–5.0)
Alkaline Phosphatase: 43 U/L (ref 38–126)
Anion gap: 6 (ref 5–15)
BUN: 58 mg/dL — ABNORMAL HIGH (ref 8–23)
CO2: 30 mmol/L (ref 22–32)
Calcium: 7.7 mg/dL — ABNORMAL LOW (ref 8.9–10.3)
Chloride: 105 mmol/L (ref 98–111)
Creatinine, Ser: 1.12 mg/dL — ABNORMAL HIGH (ref 0.44–1.00)
GFR calc Af Amer: 53 mL/min — ABNORMAL LOW (ref >60)
GFR calc non Af Amer: 46 mL/min — ABNORMAL LOW (ref >60)
Glucose, Bld: 178 mg/dL — ABNORMAL HIGH (ref 70–99)
Potassium: 4.1 mmol/L (ref 3.5–5.1)
Sodium: 141 mmol/L (ref 135–145)
Total Bilirubin: 0.8 mg/dL (ref 0.3–1.2)
Total Protein: 5 g/dL — ABNORMAL LOW (ref 6.5–8.1)

## 2019-12-15 LAB — MAGNESIUM: Magnesium: 1.8 mg/dL (ref 1.7–2.4)

## 2019-12-15 LAB — GLUCOSE, CAPILLARY
Glucose-Capillary: 171 mg/dL — ABNORMAL HIGH (ref 70–99)
Glucose-Capillary: 174 mg/dL — ABNORMAL HIGH (ref 70–99)
Glucose-Capillary: 208 mg/dL — ABNORMAL HIGH (ref 70–99)
Glucose-Capillary: 258 mg/dL — ABNORMAL HIGH (ref 70–99)
Glucose-Capillary: 264 mg/dL — ABNORMAL HIGH (ref 70–99)
Glucose-Capillary: 290 mg/dL — ABNORMAL HIGH (ref 70–99)
Glucose-Capillary: 296 mg/dL — ABNORMAL HIGH (ref 70–99)

## 2019-12-15 LAB — PHOSPHORUS: Phosphorus: 3 mg/dL (ref 2.5–4.6)

## 2019-12-15 LAB — PROCALCITONIN: Procalcitonin: 0.23 ng/mL

## 2019-12-15 MED ORDER — SODIUM CHLORIDE 0.9% FLUSH: 10.0000 mL | INTRAVENOUS | Status: DC | PRN

## 2019-12-15 MED ORDER — DEXMEDETOMIDINE HCL IN NACL 400 MCG/100ML IV SOLN
0.0000 ug/kg/h | INTRAVENOUS | Status: DC
  Administered 2019-12-15: 11:00:00 0.4 ug/kg/h via INTRAVENOUS
  Administered 2019-12-15: 0.6 ug/kg/h via INTRAVENOUS
  Administered 2019-12-15: 0.8 ug/kg/h via INTRAVENOUS
  Administered 2019-12-16 (×2): 1 ug/kg/h via INTRAVENOUS
  Administered 2019-12-16: 0.8 ug/kg/h via INTRAVENOUS
  Administered 2019-12-16: 1 ug/kg/h via INTRAVENOUS
  Administered 2019-12-16: 17:00:00 0.7 ug/kg/h via INTRAVENOUS
  Administered 2019-12-17: 0.8 ug/kg/h via INTRAVENOUS
  Administered 2019-12-17: 0.7 ug/kg/h via INTRAVENOUS
  Filled 2019-12-15 (×9): qty 100

## 2019-12-15 MED ORDER — SODIUM CHLORIDE 0.9% FLUSH
10.0000 mL | Freq: Two times a day (BID) | INTRAVENOUS | Status: DC
  Administered 2019-12-15 – 2019-12-22 (×13): 10 mL

## 2019-12-15 NOTE — Progress Notes (Signed)
2000: Pt has severe tremor with any interaction, including turns/mouth care. Eyes deviate upward through out tremor, with a startle reflex, however after tremor has stopped there is a period of time ( about 2 minutes) where pt appears to be post-ictal, with no startle reflex, and seemingly unresponsive. EEG was done today that shows no seizure activity. Responds well to PRN Versed.   2243: Precedex paused due to pt heart rate dropping to low 50s. Remains on fentanyl gtt. Still with tremors at any interaction with pt.   2300: Pt with multiple watery stools, called MD for Flexiseal.   0140: Spoke with MD regarding continued tremors/seizure like activity. No changes at this time. Pt is grabbing toward ET Tube, seemingly more purposeful movements.

## 2019-12-15 NOTE — Progress Notes (Addendum)
NAME:  [name], MRN:  [id number], DOB:  02-Aug-1935, LOS: 3 ADMISSION DATE:  11/27/2019, CONSULTATION DATE:   December 12, 2019 REFERRING MD: Dr. Owens Shark, CHIEF COMPLAINT:  Brought in by EMS confused, hypoxemic  Brief History   85 year old female recently diagnosed with Covid pneumonia brought to the [facility] emergency room combative hypoxemic on January 24 from a nursing home.  Required intubation, transferred to [facility] for further management.  History of present illness   85 year old female recently diagnosed with Covid pneumonia brought to the [place] emergency room combative hypoxemic on January 24 from a nursing home.  Required intubation, transferred to [facility] for further management. She was intubated upon arrival and cannot provide further history.  By chart review it seems that she takes Eliquis for atrial fibrillation.  Past Medical History  Squamous cell carcinoma of the nose COPD on 2 L oxygen Coronary artery disease Hyperlipidemia Hypertension GERD Osteoarthritis  Significant Hospital Events   1/24 intubation/admit  Consults:    Procedures:  1/24 ETT >  1/26 cvc->  Significant Diagnostic Tests:  1/8 cth: No acute intracranial abnormality.   Findings consistent with age related atrophy and chronic small vessel ischemia   Small unchanged lacunar infarct in the left basal ganglia. 1/9 MRI brain: 1. Severely motion degraded examination. 2. No acute infarct identified. 3. Chronic left basal ganglia lacunar infarcts. 1/11 ECHO:  LVEF 0000000 Grade 1 diastolic dysfunction  99991111 eeg: pending      Micro Data:  1/24 POC SARS CoV2 > positive 1/24 urine: ngtd 1/24 blood: ngtd 1/26 resp:   Antimicrobials:  1/24 decadron >  1/24 remdesivir >  1/24 baricitinib->  Interim history/subjective:  1/27: pt lost iv access yesterday so cvc placed. Also appears to have had more episodes yesterday that were seizure like but consistent with  episode noted on eeg that is NOT a seizure. ? pseudosz activity although pt is on sedation so less likely. Also known to have tremors per pt's family although these episodes seem more pronounced. Diarrhea overnight as well as temp to 102.9 1/26:  Pt with tremulous/jerking-like motion when sedation weaned. Family states has baseline tremor for which she is on propanolol. This was restarted yesterday late morning and eeg ordered. Overnight still with episodes so was started on keppra and prn versed. eeg still pending. tmax 102.7 overnight. On 50%/10 on PRVC. All labs still pending this am 1/25: remains intubated sedated. Some agitation reported overnight. On precedex infusion. afib with rvr overnight but now rate controlled.   Objective   Blood pressure 103/67, pulse 71, temperature 97.8 F (36.6 C), temperature source Oral, resp. rate (!) 28, height 5\' 4"  (1.626 m), weight 102.4 kg, SpO2 (!) 89 %.    Vent Mode: PRVC FiO2 (%):  [50 %-80 %] 80 % Set Rate:  [28 bmp] 28 bmp Vt Set:  [430 mL] 430 mL PEEP:  [8 cmH20-12 cmH20] 12 cmH20 Plateau Pressure:  [22 cmH20-25 cmH20] 22 cmH20   Intake/Output Summary (Last 24 hours) at 12/15/2019 0921 Last data filed at 12/15/2019 0800 Gross per 24 hour  Intake 2241.74 ml  Output 1435 ml  Net 806.74 ml   Filed Weights   12/15/2019 1230 12/13/19 0315 12/15/19 0432  Weight: 100.2 kg 100.5 kg 102.4 kg    Examination:  General:  In bed on vent awake but not following commands. Tremors noted HENT: NCAT PERRLA, ETT in place PULM: CTA B, vent supported breathing CV: irreg, no mgr, radial pulses intact,  cap refill normal GI: BS+, soft, nontender MSK: normal bulk and tone Neuro: agitated (sedation held) on vent    Resolved Hospital Problem list     Assessment & Plan:  COVID pneumonia causing severe acute respiratory failure Cont ICU care Continue mechanical ventilation per ARDS protocol Target TVol 6-8cc/kgIBW Target Plateau Pressure < 30cm  H20 Target driving pressure less than 15 cm of water Target PaO2 55-65: titrate PEEP/FiO2 per protocol As long as PaO2 to FiO2 ratio is less than 1:150 position in prone position for 16 hours a day Check CVP daily if CVL in place Target CVP less than 4, diurese as necessary Ventilator associated pneumonia prevention protocol Decadron 10 days remdesivir 5 days baricitinib 14 days   Sepsis in setting of COVID infection: lactic acid normal -hemodynamically stable -tmax 102.9 -pct downtrending -resp cx pending  Hypotension in setting of propfol use -tolerating precedex gtt, some bradycardia but no hypotension associated. Can cont precedex with bradycardia as long as no other hemodynamic instabilities.  -prn versed  Need for sedation for mechanical ventilation Acute metabolic encephalopathy due to hypoxemia from COVID -cont PAD protocol: precedex, fentanyl infusion, prn versed  AKI CKD3a: -improved, baseline Cr 1.1 Monitor BMET and UOP Replace electrolytes as needed   CAD Tele Continue statin Hold home nitrate for now HFpEF:  -if BP allows will attempt to diurese some today.  Essential Hypertension currently normotensive Hold home amlodipine Persistent Atrial fib Eliquis  DM2 with Hyperglycemia: A1c: 8 -ssi  Thrombocytopenia:  -follow values likely 2/2 acute illness -stable  Tremors with myoclonus:  -restarted propanolol on 1/25 -eeg negative (episode was occurring during eeg and did NOT correlate) -presenting cth/mri negative for acute process.   CODE STATUS: DNR  Best practice:  Diet: tube feeding and free water Pain/Anxiety/Delirium protocol (if indicated): yes, as above VAP protocol (if indicated): yes DVT prophylaxis: Eliquis GI prophylaxis: famotidine Glucose control: SSI q4h accuchecks Mobility: bed rest Code Status: DNR Family Communication: I updated her son by phone on 1/27 Disposition: remain in ICU  Labs   CBC: Recent Labs  Lab  11/20/2019 0634 12/06/2019 0634 12/01/2019 1316 12/14/2019 1330 12/13/19 0604 12/14/19 1140 12/15/19 0450  WBC 9.9  --   --  8.8 5.2 11.2* 8.9  NEUTROABS 9.1*  --   --  8.0*  --   --   --   HGB 12.6   < > 12.9 13.0 13.4 14.1 12.7  HCT 42.5   < > 38.0 44.9 43.1 46.2* 41.8  MCV 103.9*  --   --  103.7* 98.9 99.4 99.8  PLT 173  --   --  158 133* 145* 138*   < > = values in this interval not displayed.    Basic Metabolic Panel: Recent Labs  Lab 12/18/2019 0634 11/29/2019 0634 12/18/2019 1316 12/08/2019 1330 11/22/2019 1330 12/11/2019 1705 12/13/19 0604 12/13/19 1620 12/14/19 0604 12/15/19 0450  NA 142   < > 140 141  --   --  139  --  142 141  K 5.2*   < > 4.6 4.7  --   --  4.6  --  4.6 4.1  CL 98  --   --  103  --   --  106  --  107 105  CO2 33*  --   --  26  --   --  23  --  25 30  GLUCOSE 161*  --   --  205*  --   --  249*  --  239* 178*  BUN 32*  --   --  34*  --   --  49*  --  60* 58*  CREATININE 1.51*  --   --  1.42*  --   --  1.22*  --  1.23* 1.12*  CALCIUM 8.4*  --   --  8.0*  --   --  8.3*  --  8.4* 7.7*  MG  --   --   --  1.9   < > 1.9 1.9 1.9 2.0 1.8  PHOS  --   --   --  2.2*   < > 2.3* 2.7 3.3 2.9 3.0   < > = values in this interval not displayed.   GFR: Estimated Creatinine Clearance: 45.1 mL/min (A) (by C-G formula based on SCr of 1.12 mg/dL (H)). Recent Labs  Lab 12/17/2019 0634 12/01/2019 0636 12/02/2019 0900 12/09/2019 1330 12/13/19 0604 12/14/19 0604 12/14/19 1140 12/15/19 0450  PROCALCITON  --   --   --   --  0.41 0.37  --  0.23  WBC   < >  --   --  8.8 5.2  --  11.2* 8.9  LATICACIDVEN  --  2.5* 1.1  --   --   --   --   --    < > = values in this interval not displayed.    Liver Function Tests: Recent Labs  Lab 11/28/2019 0634 12/09/2019 1330 12/14/19 0604 12/15/19 0450  AST 25 29 26  32  ALT 25 28 33 35  ALKPHOS 49 48 54 43  BILITOT 0.9 1.0 0.3 0.8  PROT 6.2* 6.0* 6.2* 5.0*  ALBUMIN 3.1* 3.1* 2.9* 2.4*   No results for input(s): LIPASE, AMYLASE in the last 168  hours. No results for input(s): AMMONIA in the last 168 hours.  ABG    Component Value Date/Time   PHART 7.373 11/23/2019 1316   PCO2ART 43.4 12/18/2019 1316   PO2ART 86.0 11/30/2019 1316   HCO3 25.3 11/23/2019 1316   TCO2 27 12/02/2019 1316   O2SAT 96.0 11/29/2019 1316     Coagulation Profile: Recent Labs  Lab 12/02/2019 0634  INR 1.4*    Cardiac Enzymes: No results for input(s): CKTOTAL, CKMB, CKMBINDEX, TROPONINI in the last 168 hours.  HbA1C: Hgb A1c MFr Bld  Date/Time Value Ref Range Status  12/08/2019 05:05 PM 8.0 (H) 4.8 - 5.6 % Final    Comment:    (NOTE) Pre diabetes:          5.7%-6.4% Diabetes:              >6.4% Glycemic control for   <7.0% adults with diabetes   10/06/2017 04:47 AM 6.5 (H) 4.8 - 5.6 % Final    Comment:    (NOTE) Pre diabetes:          5.7%-6.4% Diabetes:              >6.4% Glycemic control for   <7.0% adults with diabetes     CBG: Recent Labs  Lab 12/14/19 1542 12/14/19 1945 12/15/19 0017 12/15/19 0414 12/15/19 0751  GLUCAP 332* 221* 208* 171* 174*       Critical care time: 50 minutes    Critical care time: The patient is critically ill with multiple organ systems failure and requires high complexity decision making for assessment and support, frequent evaluation and titration of therapies, application of advanced monitoring technologies and extensive interpretation of multiple databases.  Critical care time 50 mins. This represents my time  independent of the NPs time taking care of the pt. This is excluding procedures.    [facility] 12/15/2019, 9:21 AM

## 2019-12-15 NOTE — Progress Notes (Signed)
Inpatient Diabetes Program Recommendations  AACE/ADA: New Consensus Statement on Inpatient Glycemic Control (2015)  Target Ranges:  Prepandial:   less than 140 mg/dL      Peak postprandial:   less than 180 mg/dL (1-2 hours)      Critically ill patients:  140 - 180 mg/dL   Lab Results  Component Value Date   GLUCAP 174 (H) 12/15/2019   HGBA1C 8.0 (H) 12/05/2019    Review of Glycemic Control Results for LEAHNA, QUILLMAN (MRN [id number]) as of 12/15/2019 10:42  Ref. Range 12/14/2019 19:45 12/15/2019 00:17 12/15/2019 04:14 12/15/2019 07:51  Glucose-Capillary Latest Ref Range: 70 - 99 mg/dL 221 (H) 208 (H) 171 (H) 174 (H)   Diabetes history: None Outpatient Diabetes medications:NA; noted Prednisone 40 mg daily on home med list Current orders for Inpatient glycemic control:Novolog 0-20 units Q4H; Decadron 6 mg Q24H, Levemir 10 units daily Pivot 1.5 cal- 50 ml/hr  Inpatient Diabetes Program Recommendations:    Blood sugars improved this morning. If blood sugars>180 mg/dL, consider adding tube feed coverage.  Thanks,  Adah Perl, RN, BC-ADM Inpatient Diabetes Coordinator Pager [phone] (8a-5p)

## 2019-12-15 NOTE — Progress Notes (Signed)
eLink Physician-Brief Progress Note Patient Name: [name] DOB: 01/24/1935 MRN: [id number]   Date of Service  12/15/2019  HPI/Events of Note  Essential tremor simulating seizures? (EEG was negative while she was having an event.)  eICU Interventions  Pt is already on Propranolol, no other intervention at this time.        Kerry Kass Frederica Chrestman 12/15/2019, 1:37 AM

## 2019-12-16 LAB — GLUCOSE, CAPILLARY
Glucose-Capillary: 182 mg/dL — ABNORMAL HIGH (ref 70–99)
Glucose-Capillary: 233 mg/dL — ABNORMAL HIGH (ref 70–99)
Glucose-Capillary: 267 mg/dL — ABNORMAL HIGH (ref 70–99)
Glucose-Capillary: 195 mg/dL — ABNORMAL HIGH (ref 70–99)
Glucose-Capillary: 199 mg/dL — ABNORMAL HIGH (ref 70–99)

## 2019-12-16 LAB — COMPREHENSIVE METABOLIC PANEL
ALT: 44 U/L (ref 0–44)
AST: 32 U/L (ref 15–41)
Albumin: 2.9 g/dL — ABNORMAL LOW (ref 3.5–5.0)
Alkaline Phosphatase: 54 U/L (ref 38–126)
Anion gap: 5 (ref 5–15)
BUN: 64 mg/dL — ABNORMAL HIGH (ref 8–23)
CO2: 30 mmol/L (ref 22–32)
Calcium: 8.4 mg/dL — ABNORMAL LOW (ref 8.9–10.3)
Chloride: 106 mmol/L (ref 98–111)
Creatinine, Ser: 1.21 mg/dL — ABNORMAL HIGH (ref 0.44–1.00)
GFR calc Af Amer: 48 mL/min — ABNORMAL LOW (ref >60)
GFR calc non Af Amer: 42 mL/min — ABNORMAL LOW (ref >60)
Glucose, Bld: 242 mg/dL — ABNORMAL HIGH (ref 70–99)
Potassium: 5.2 mmol/L — ABNORMAL HIGH (ref 3.5–5.1)
Sodium: 141 mmol/L (ref 135–145)
Total Bilirubin: 0.5 mg/dL (ref 0.3–1.2)
Total Protein: 5.9 g/dL — ABNORMAL LOW (ref 6.5–8.1)

## 2019-12-16 LAB — CULTURE, RESPIRATORY W GRAM STAIN: Culture: NORMAL

## 2019-12-16 LAB — C-REACTIVE PROTEIN: CRP: 4.7 mg/dL — ABNORMAL HIGH (ref <1.0)

## 2019-12-16 LAB — D-DIMER, QUANTITATIVE: D-Dimer, Quant: 1.53 ug/mL-FEU — ABNORMAL HIGH (ref 0.00–0.50)

## 2019-12-16 LAB — CBC
HCT: 46.6 % — ABNORMAL HIGH (ref 36.0–46.0)
Hemoglobin: 14.6 g/dL (ref 12.0–15.0)
MCH: 31.2 pg (ref 26.0–34.0)
MCHC: 31.3 g/dL (ref 30.0–36.0)
MCV: 99.6 fL (ref 80.0–100.0)
Platelets: 138 10*3/uL — ABNORMAL LOW (ref 150–400)
RBC: 4.68 MIL/uL (ref 3.87–5.11)
RDW: 12.9 % (ref 11.5–15.5)
WBC: 7.7 10*3/uL (ref 4.0–10.5)
nRBC: 0.3 % — ABNORMAL HIGH (ref 0.0–0.2)

## 2019-12-16 LAB — MAGNESIUM: Magnesium: 2.3 mg/dL (ref 1.7–2.4)

## 2019-12-16 LAB — PHOSPHORUS: Phosphorus: 3.5 mg/dL (ref 2.5–4.6)

## 2019-12-16 MED ORDER — GABAPENTIN 300 MG PO CAPS
300.0000 mg | ORAL_CAPSULE | Freq: Two times a day (BID) | ORAL | Status: DC
  Administered 2019-12-16 – 2019-12-17 (×3): 300 mg
  Filled 2019-12-16 (×3): qty 1

## 2019-12-16 MED ORDER — AMLODIPINE BESYLATE 5 MG PO TABS
10.0000 mg | ORAL_TABLET | Freq: Every day | ORAL | Status: DC
  Administered 2019-12-16 – 2019-12-20 (×5): 10 mg via ORAL
  Filled 2019-12-16 (×5): qty 2

## 2019-12-16 MED ORDER — AMITRIPTYLINE HCL 10 MG PO TABS
10.0000 mg | ORAL_TABLET | Freq: Every evening | ORAL | Status: DC | PRN
  Administered 2019-12-16: 10 mg via ORAL
  Filled 2019-12-16 (×2): qty 1

## 2019-12-16 MED ORDER — FUROSEMIDE 10 MG/ML IJ SOLN
40.0000 mg | Freq: Once | INTRAMUSCULAR | Status: AC
  Administered 2019-12-16: 40 mg via INTRAVENOUS
  Filled 2019-12-16: qty 4

## 2019-12-16 MED ORDER — INSULIN ASPART 100 UNIT/ML ~~LOC~~ SOLN
2.0000 [IU] | SUBCUTANEOUS | Status: DC
  Administered 2019-12-16 – 2019-12-19 (×20): 2 [IU] via SUBCUTANEOUS

## 2019-12-16 MED ORDER — DEXTROSE 10 % IV SOLN: INTRAVENOUS | Status: DC

## 2019-12-16 MED ORDER — BUSPIRONE HCL 10 MG PO TABS
10.0000 mg | ORAL_TABLET | Freq: Two times a day (BID) | ORAL | Status: DC
  Administered 2019-12-16 – 2019-12-20 (×9): 10 mg via ORAL
  Filled 2019-12-16 (×9): qty 1

## 2019-12-16 MED ORDER — ISOSORBIDE MONONITRATE 10 MG PO TABS
10.0000 mg | ORAL_TABLET | Freq: Two times a day (BID) | ORAL | Status: DC
  Administered 2019-12-16 – 2019-12-20 (×8): 10 mg via ORAL
  Filled 2019-12-16 (×10): qty 1

## 2019-12-16 MED FILL — Fentanyl Citrate Preservative Free (PF) Inj 2500 MCG/50ML: INTRAMUSCULAR | Qty: 50 | Status: AC

## 2019-12-16 MED FILL — Sodium Chloride IV Soln 0.9%: INTRAVENOUS | Qty: 250 | Status: AC

## 2019-12-16 NOTE — Progress Notes (Signed)
MD notified:  Per discussion at rounds re BP s/p lasix has had 1898mL output and BP went from 164/110 to 184/98  S/w patient's son [name] [phone]; provided progress report for today and answered all questions

## 2019-12-16 NOTE — Progress Notes (Signed)
ANTICOAGULATION CONSULT NOTE   Pharmacy Consult for apixaban Indication: atrial fibrillation  Allergies  Allergen Reactions  . Iohexol Other (See Comments)    Code:  HIVES, Desc:  PER ROBIN @ GI, PT IS ALLERGIC TO IVP DYE 08/28/10 RM  . Lipitor [Atorvastatin Calcium] Hives    Patient Measurements: Actual body weight: 100.2 kg   Vital Signs: Temp: 98.5 F (36.9 C) (01/28 0400) Temp Source: Oral (01/28 0400) BP: 160/103 (01/28 0903) Pulse Rate: 79 (01/28 0903)  Labs: Recent Labs    12/14/19 0604 12/14/19 1140 12/14/19 1140 12/15/19 0450 12/16/19 0500  HGB  --  14.1   < > 12.7 14.6  HCT  --  46.2*  --  41.8 46.6*  PLT  --  145*  --  138* 138*  CREATININE 1.23*  --   --  1.12* 1.21*   < > = values in this interval not displayed.    Estimated Creatinine Clearance: 41.8 mL/min (A) (by C-G formula based on SCr of 1.21 mg/dL (H)).   Medications:  . apixaban  5 mg Per Tube BID  . baricitinib  2 mg Oral Daily  . chlorhexidine gluconate (MEDLINE KIT)  15 mL Mouth Rinse BID  . Chlorhexidine Gluconate Cloth  6 each Topical Daily  . dexamethasone (DECADRON) injection  6 mg Intravenous Q24H  . feeding supplement (PRO-STAT SUGAR FREE 64)  30 mL Per Tube Daily  . free water  200 mL Per Tube Q4H  . insulin aspart  0-20 Units Subcutaneous Q4H  . insulin aspart  2 Units Subcutaneous Q4H  . insulin detemir  10 Units Subcutaneous Daily  . mouth rinse  15 mL Mouth Rinse 10 times per day  . pravastatin  40 mg Oral Daily  . propranolol  10 mg Oral BID  . sodium chloride flush  10-40 mL Intracatheter Q12H     Assessment: 84 YOF with COVID-19 pneumonia was recently started on apixaban 5 mg twice daily for AFib and high CHADSVASC score. Pharmacy consulted to resume apixaban. H/H wnl and Plt remains low/stable at 138k. SCr is stable ~ 1.2.  Goal of Therapy:  Primary stroke prevention Monitor platelets by anticoagulation protocol: Yes   Plan:  -Apixaban 5 mg twice daily -Pharmacy  will sign off consult, but will continue to f/u peripherally.    PharmD, BCPS Clinical pharmacist phone 7am- 5pm: [phone] 12/16/2019 9:49 AM       

## 2019-12-16 NOTE — Progress Notes (Signed)
NAME:  [name], MRN:  [id number], DOB:  1935-05-13, LOS: 4 ADMISSION DATE:  12/19/2019, CONSULTATION DATE:   December 12, 2019 REFERRING MD: Dr. Owens Shark, CHIEF COMPLAINT:  Brought in by EMS confused, hypoxemic  Brief History   85 year old female recently diagnosed with Covid pneumonia brought to the [facility] emergency room combative hypoxemic on January 24 from a nursing home.  Required intubation, transferred to [facility] for further management.  History of present illness   85 year old female recently diagnosed with Covid pneumonia brought to the [place] emergency room combative hypoxemic on January 24 from a nursing home.  Required intubation, transferred to [facility] for further management. She was intubated upon arrival and cannot provide further history.  By chart review it seems that she takes Eliquis for atrial fibrillation.  Past Medical History  Squamous cell carcinoma of the nose COPD on 2 L oxygen Coronary artery disease Hyperlipidemia Hypertension GERD Osteoarthritis  Significant Hospital Events   1/24 intubation/admit  Consults:    Procedures:  1/24 ETT >  1/26 cvc->  Significant Diagnostic Tests:  1/8 cth: No acute intracranial abnormality.   Findings consistent with age related atrophy and chronic small vessel ischemia   Small unchanged lacunar infarct in the left basal ganglia. 1/9 MRI brain: 1. Severely motion degraded examination. 2. No acute infarct identified. 3. Chronic left basal ganglia lacunar infarcts. 1/11 ECHO:  LVEF 0000000 Grade 1 diastolic dysfunction  99991111 eeg: pending      Micro Data:  1/24 POC SARS CoV2 > positive 1/24 urine: ngtd 1/24 blood: ngtd 1/26 resp:   Antimicrobials:  1/24 decadron >  1/24 remdesivir >  1/24 baricitinib->  Interim history/subjective:  1/28: no acute events overnight reported 1/27: pt lost iv access yesterday so cvc placed. Also appears to have had more episodes yesterday  that were seizure like but consistent with episode noted on eeg that is NOT a seizure. ? pseudosz activity although pt is on sedation so less likely. Also known to have tremors per pt's family although these episodes seem more pronounced. Diarrhea overnight as well as temp to 102.9 1/26:  Pt with tremulous/jerking-like motion when sedation weaned. Family states has baseline tremor for which she is on propanolol. This was restarted yesterday late morning and eeg ordered. Overnight still with episodes so was started on keppra and prn versed. eeg still pending. tmax 102.7 overnight. On 50%/10 on PRVC. All labs still pending this am 1/25: remains intubated sedated. Some agitation reported overnight. On precedex infusion. afib with rvr overnight but now rate controlled.   Objective   Blood pressure (!) 156/103, pulse 82, temperature 98.5 F (36.9 C), temperature source Oral, resp. rate (!) 22, height 5\' 4"  (1.626 m), weight 102.4 kg, SpO2 93 %.    Vent Mode: PRVC FiO2 (%):  [60 %-80 %] 60 % Set Rate:  [28 bmp] 28 bmp Vt Set:  [430 mL] 430 mL PEEP:  [10 [id number] cmH20] 10 cmH20 Plateau Pressure:  [22 cmH20-25 cmH20] 24 cmH20   Intake/Output Summary (Last 24 hours) at 12/16/2019 0810 Last data filed at 12/16/2019 [id number] Gross per 24 hour  Intake 1875.39 ml  Output 1900 ml  Net -24.61 ml   Filed Weights   11/25/2019 1230 12/13/19 0315 12/15/19 0432  Weight: 100.2 kg 100.5 kg 102.4 kg    Examination:  General:  In bed on vent awake but not following commands. Tremors noted HENT: NCAT PERRLA, ETT in place PULM: CTA B, vent supported breathing CV:  irreg, no mgr, radial pulses intact, cap refill normal GI: BS+, soft, nontender MSK: normal bulk and tone Neuro: agitated (sedation held) on vent    Resolved Hospital Problem list     Assessment & Plan:  COVID pneumonia causing severe acute on chronic hypoxic respiratory failure Cont ICU care Continue mechanical ventilation per ARDS  protocol Target TVol 6-8cc/kgIBW Target Plateau Pressure < 30cm H20 Target driving pressure less than 15 cm of water Target PaO2 55-65: titrate PEEP/FiO2 per protocol As long as PaO2 to FiO2 ratio is less than 1:150 position in prone position for 16 hours a day Check CVP daily if CVL in place Target CVP less than 4, diurese as necessary.Marland Kitchen giving dose lasix today.  Ventilator associated pneumonia prevention protocol Decadron 10 days remdesivir 5 days baricitinib 14 days -ddimer increased to 1.53 today from 0.9.  -pt on chronic steroids at baseline  H/o copd:  -on chronic steroids per [facility] Chronic hypoxic resp failure:  -on 2-3L at baseline  Sepsis in setting of COVID infection: lactic acid normal -hemodynamically stable -fever curve improved -pct downtrending -resp cx pending  Hypotension in setting of propfol use -tolerating precedex gtt, some bradycardia but no hypotension associated. Can cont precedex with bradycardia as long as no other hemodynamic instabilities.  -prn versed -improved and now becoming hypertensive will diurese and if needed will resume home meds.   Need for sedation for mechanical ventilation Acute metabolic encephalopathy due to hypoxemia from COVID -cont PAD protocol: precedex, fentanyl infusion, prn versed -restart home meds today.  -qt ok if needed to add seroquel.  -cont to utilize precedex as well.   AKI resolved, on  CKD3a: -improved, baseline Cr 1.1 -monitor uop and indices Hyperkalemia:  -will give dose of lasix at this time.    CAD Tele Continue statin Hold home nitrate for now HFpEF:  -diuresing today Essential Hypertension currently normotensive Hold home amlodipine -bp is trending up will provide diuretic first and if needed resume home meds.  Persistent Atrial fib Eliquis  DM2 with Hyperglycemia: A1c: 8 -ssi adding tf coverage insulin  Thrombocytopenia:  -follow values likely 2/2 acute illness -stable  Tremors with  myoclonus:  -restarted propanolol on 1/25 -eeg negative (episode was occurring during eeg and did NOT correlate) -presenting cth/mri negative for acute process.    CODE STATUS: DNR  Best practice:  Diet: tube feeding and free water Pain/Anxiety/Delirium protocol (if indicated): yes, as above VAP protocol (if indicated): yes DVT prophylaxis: Eliquis GI prophylaxis: famotidine Glucose control: SSI q4h accuchecks, tf coverage Mobility: bed rest Code Status: DNR Family Communication: I updated her son by phone on 1/28 Disposition: remain in ICU  Labs   CBC: Recent Labs  Lab 12/15/2019 0634 11/19/2019 1316 12/04/2019 1330 12/13/19 0604 12/14/19 1140 12/15/19 0450 12/16/19 0500  WBC 9.9  --  8.8 5.2 11.2* 8.9 7.7  NEUTROABS 9.1*  --  8.0*  --   --   --   --   HGB 12.6   < > 13.0 13.4 14.1 12.7 14.6  HCT 42.5   < > 44.9 43.1 46.2* 41.8 46.6*  MCV 103.9*  --  103.7* 98.9 99.4 99.8 99.6  PLT 173  --  158 133* 145* 138* 138*   < > = values in this interval not displayed.    Basic Metabolic Panel: Recent Labs  Lab 12/03/2019 1330 12/09/2019 1705 12/13/19 0604 12/13/19 1620 12/14/19 0604 12/15/19 0450 12/16/19 0500  NA 141  --  139  --  142 141  141  K 4.7  --  4.6  --  4.6 4.1 5.2*  CL 103  --  106  --  107 105 106  CO2 26  --  23  --  25 30 30   GLUCOSE 205*  --  249*  --  239* 178* 242*  BUN 34*  --  49*  --  60* 58* 64*  CREATININE 1.42*  --  1.22*  --  1.23* 1.12* 1.21*  CALCIUM 8.0*  --  8.3*  --  8.4* 7.7* 8.4*  MG 1.9   < > 1.9 1.9 2.0 1.8 2.3  PHOS 2.2*   < > 2.7 3.3 2.9 3.0 3.5   < > = values in this interval not displayed.   GFR: Estimated Creatinine Clearance: 41.8 mL/min (A) (by C-G formula based on SCr of 1.21 mg/dL (H)). Recent Labs  Lab 11/28/2019 0636 11/20/2019 0900 12/18/2019 1330 12/13/19 0604 12/14/19 0604 12/14/19 1140 12/15/19 0450 12/16/19 0500  PROCALCITON  --   --   --  0.41 0.37  --  0.23  --   WBC  --   --    < > 5.2  --  11.2* 8.9 7.7   LATICACIDVEN 2.5* 1.1  --   --   --   --   --   --    < > = values in this interval not displayed.    Liver Function Tests: Recent Labs  Lab 12/16/2019 0634 12/15/2019 1330 12/14/19 0604 12/15/19 0450 12/16/19 0500  AST 25 29 26  32 32  ALT 25 28 33 35 44  ALKPHOS 49 48 54 43 54  BILITOT 0.9 1.0 0.3 0.8 0.5  PROT 6.2* 6.0* 6.2* 5.0* 5.9*  ALBUMIN 3.1* 3.1* 2.9* 2.4* 2.9*   No results for input(s): LIPASE, AMYLASE in the last 168 hours. No results for input(s): AMMONIA in the last 168 hours.  ABG    Component Value Date/Time   PHART 7.373 12/06/2019 1316   PCO2ART 43.4 12/15/2019 1316   PO2ART 86.0 11/22/2019 1316   HCO3 25.3 12/11/2019 1316   TCO2 27 12/11/2019 1316   O2SAT 96.0 12/15/2019 1316     Coagulation Profile: Recent Labs  Lab 11/30/2019 0634  INR 1.4*    Cardiac Enzymes: No results for input(s): CKTOTAL, CKMB, CKMBINDEX, TROPONINI in the last 168 hours.  HbA1C: Hgb A1c MFr Bld  Date/Time Value Ref Range Status  12/19/2019 05:05 PM 8.0 (H) 4.8 - 5.6 % Final    Comment:    (NOTE) Pre diabetes:          5.7%-6.4% Diabetes:              >6.4% Glycemic control for   <7.0% adults with diabetes   10/06/2017 04:47 AM 6.5 (H) 4.8 - 5.6 % Final    Comment:    (NOTE) Pre diabetes:          5.7%-6.4% Diabetes:              >6.4% Glycemic control for   <7.0% adults with diabetes     CBG: Recent Labs  Lab 12/15/19 1233 12/15/19 1812 12/15/19 2001 12/15/19 2324 12/16/19 0442  GLUCAP 290* 296* 258* 264* 182*          Critical care time: The patient is critically ill with multiple organ systems failure and requires high complexity decision making for assessment and support, frequent evaluation and titration of therapies, application of advanced monitoring technologies and extensive interpretation of multiple databases.  Critical care time 43mins. This represents my time independent of the NPs time taking care of the pt. This is excluding  procedures.    [name] DO [facility] 12/16/2019, 8:10 AM

## 2019-12-17 LAB — COMPREHENSIVE METABOLIC PANEL
ALT: 51 U/L — ABNORMAL HIGH (ref 0–44)
AST: 30 U/L (ref 15–41)
Albumin: 2.6 g/dL — ABNORMAL LOW (ref 3.5–5.0)
Alkaline Phosphatase: 45 U/L (ref 38–126)
Anion gap: 10 (ref 5–15)
BUN: 68 mg/dL — ABNORMAL HIGH (ref 8–23)
CO2: 28 mmol/L (ref 22–32)
Calcium: 7.9 mg/dL — ABNORMAL LOW (ref 8.9–10.3)
Chloride: 101 mmol/L (ref 98–111)
Creatinine, Ser: 1.36 mg/dL — ABNORMAL HIGH (ref 0.44–1.00)
GFR calc Af Amer: 42 mL/min — ABNORMAL LOW (ref >60)
GFR calc non Af Amer: 36 mL/min — ABNORMAL LOW (ref >60)
Glucose, Bld: 182 mg/dL — ABNORMAL HIGH (ref 70–99)
Potassium: 4.1 mmol/L (ref 3.5–5.1)
Sodium: 139 mmol/L (ref 135–145)
Total Bilirubin: 0.8 mg/dL (ref 0.3–1.2)
Total Protein: 5.2 g/dL — ABNORMAL LOW (ref 6.5–8.1)

## 2019-12-17 LAB — CBC
HCT: 39.9 % (ref 36.0–46.0)
Hemoglobin: 12.5 g/dL (ref 12.0–15.0)
MCH: 30.7 pg (ref 26.0–34.0)
MCHC: 31.3 g/dL (ref 30.0–36.0)
MCV: 98 fL (ref 80.0–100.0)
Platelets: 139 10*3/uL — ABNORMAL LOW (ref 150–400)
RBC: 4.07 MIL/uL (ref 3.87–5.11)
RDW: 12.7 % (ref 11.5–15.5)
WBC: 12.1 10*3/uL — ABNORMAL HIGH (ref 4.0–10.5)
nRBC: 0 % (ref 0.0–0.2)

## 2019-12-17 LAB — MAGNESIUM: Magnesium: 1.9 mg/dL (ref 1.7–2.4)

## 2019-12-17 LAB — CULTURE, BLOOD (ROUTINE X 2)
Culture: NO GROWTH
Culture: NO GROWTH
Special Requests: ADEQUATE
Special Requests: ADEQUATE

## 2019-12-17 LAB — GLUCOSE, CAPILLARY
Glucose-Capillary: 311 mg/dL — ABNORMAL HIGH (ref 70–99)
Glucose-Capillary: 157 mg/dL — ABNORMAL HIGH (ref 70–99)
Glucose-Capillary: 141 mg/dL — ABNORMAL HIGH (ref 70–99)
Glucose-Capillary: 285 mg/dL — ABNORMAL HIGH (ref 70–99)
Glucose-Capillary: 231 mg/dL — ABNORMAL HIGH (ref 70–99)
Glucose-Capillary: 276 mg/dL — ABNORMAL HIGH (ref 70–99)

## 2019-12-17 LAB — PHOSPHORUS: Phosphorus: 3.3 mg/dL (ref 2.5–4.6)

## 2019-12-17 MED ORDER — GABAPENTIN 250 MG/5ML PO SOLN
300.0000 mg | Freq: Two times a day (BID) | ORAL | Status: DC
Start: 2019-12-17 — End: 2019-12-27
  Administered 2019-12-17 – 2019-12-27 (×20): 300 mg
  Filled 2019-12-17 (×23): qty 6

## 2019-12-17 MED ORDER — DEXMEDETOMIDINE HCL IN NACL 400 MCG/100ML IV SOLN
0.0000 ug/kg/h | INTRAVENOUS | Status: DC
  Administered 2019-12-17: 11:00:00 1 ug/kg/h via INTRAVENOUS
  Administered 2019-12-17 (×3): 1.2 ug/kg/h via INTRAVENOUS
  Administered 2019-12-18: 20:00:00 1 ug/kg/h via INTRAVENOUS
  Administered 2019-12-18: 01:00:00 1.1 ug/kg/h via INTRAVENOUS
  Administered 2019-12-18: 1.2 ug/kg/h via INTRAVENOUS
  Administered 2019-12-18 (×2): 1 ug/kg/h via INTRAVENOUS
  Administered 2019-12-18: 05:00:00 0.8 ug/kg/h via INTRAVENOUS
  Administered 2019-12-19 (×2): 1 ug/kg/h via INTRAVENOUS
  Administered 2019-12-19: 16:00:00 1.1 ug/kg/h via INTRAVENOUS
  Administered 2019-12-19 – 2019-12-20 (×8): 1 ug/kg/h via INTRAVENOUS
  Administered 2019-12-21: 0.8 ug/kg/h via INTRAVENOUS
  Administered 2019-12-21: 0.6 ug/kg/h via INTRAVENOUS
  Administered 2019-12-21: 1 ug/kg/h via INTRAVENOUS
  Administered 2019-12-21: 0.8 ug/kg/h via INTRAVENOUS
  Administered 2019-12-21: 1 ug/kg/h via INTRAVENOUS
  Administered 2019-12-21: 0.8 ug/kg/h via INTRAVENOUS
  Administered 2019-12-22 (×2): 1 ug/kg/h via INTRAVENOUS
  Administered 2019-12-22: 0.8 ug/kg/h via INTRAVENOUS
  Administered 2019-12-22: 1 ug/kg/h via INTRAVENOUS
  Administered 2019-12-22 – 2019-12-23 (×2): 1.1 ug/kg/h via INTRAVENOUS
  Administered 2019-12-23 (×2): 1.2 ug/kg/h via INTRAVENOUS
  Administered 2019-12-23: 1.1 ug/kg/h via INTRAVENOUS
  Administered 2019-12-24 – 2019-12-25 (×5): 0.4 ug/kg/h via INTRAVENOUS
  Administered 2019-12-25 – 2019-12-26 (×4): 0.5 ug/kg/h via INTRAVENOUS
  Administered 2019-12-27: 0.4 ug/kg/h via INTRAVENOUS
  Filled 2019-12-17 (×40): qty 100
  Filled 2019-12-17: qty 1400
  Filled 2019-12-17 (×5): qty 100

## 2019-12-17 NOTE — Progress Notes (Signed)
Notified Elink  Informed pt has mittens on and almost extubated herself, and hitting and kicking, pt is very strong, requested 4 point restraints .

## 2019-12-17 NOTE — Progress Notes (Signed)
[facility] Progress Note Patient Name: [name] DOB: 09/26/1935 MRN: [id number]   Date of Service  12/17/2019  HPI/Events of Note  RN asking for wrist and ankle restraints - pt pulling at ETT and kicking.    eICU Interventions  Soft wrist and ankle restraints ordered.  ELink RN already advised floor RN to increase Fentanyl gtt and use Versed prn orders.     Intervention Category Minor Interventions: Other:  Richardson Dopp, PA-C  12/17/2019, 9:45 PM

## 2019-12-17 NOTE — Progress Notes (Signed)
NAME:  [name], MRN:  [id number], DOB:  04-03-1935, LOS: 5 ADMISSION DATE:  11/21/2019, CONSULTATION DATE:   December 12, 2019 REFERRING MD: Dr. Owens Shark, CHIEF COMPLAINT:  Brought in by EMS confused, hypoxemic  Brief History   85 year old female recently diagnosed with Covid pneumonia brought to the [facility] emergency room combative hypoxemic on January 24 from a nursing home.  Required intubation, transferred to [facility] for further management.  History of present illness   85 year old female recently diagnosed with Covid pneumonia brought to the [place] emergency room combative hypoxemic on January 24 from a nursing home.  Required intubation, transferred to [facility] for further management. She was intubated upon arrival and cannot provide further history.  By chart review it seems that she takes Eliquis for atrial fibrillation.  Past Medical History  Squamous cell carcinoma of the nose COPD on 2 L oxygen Coronary artery disease Hyperlipidemia Hypertension GERD Osteoarthritis  Significant Hospital Events   1/24 intubation/admit 1/25: remains intubated sedated. Some agitation reported overnight. On precedex infusion. afib with rvr overnight but now rate controlled.  1/26:  Pt with tremulous/jerking-like motion when sedation weaned. Family states has baseline tremor for which she is on propanolol. This was restarted yesterday late morning and eeg ordered. Overnight still with episodes so was started on keppra and prn versed. eeg still pending. tmax 102.7 overnight. On 50%/10 on PRVC. All labs still pending this am 1/27: pt lost iv access yesterday so cvc placed. Also appears to have had more episodes yesterday that were seizure like but consistent with episode noted on eeg that is NOT a seizure. ? pseudosz activity although pt is on sedation so less likely. Also known to have tremors per pt's family although these episodes seem more pronounced. Diarrhea overnight as  well as temp to 102.9 1/28: no acute events overnight reported   Consults:    Procedures:  1/24 ETT >  1/26 RIJ cvc->  Significant Diagnostic Tests:  1/8 cth: No acute intracranial abnormality.   Findings consistent with age related atrophy and chronic small vessel ischemia   Small unchanged lacunar infarct in the left basal ganglia. 1/9 MRI brain: 1. Severely motion degraded examination. 2. No acute infarct identified. 3. Chronic left basal ganglia lacunar infarcts. 1/11 ECHO:  [id number] 0000000 Grade 1 diastolic dysfunction  99991111 eeg: Severe diffuse encephalopathy, no EEG correlate with upward deviated eyes and whole body shaking, no seizure activity      Micro Data:  1/24 POC SARS CoV2 > positive 1/24 urine: ngtd 1/24 blood: ngtd 1/26 resp: Normal flora  Antimicrobials:  1/24 decadron >  1/24 remdesivir > 1/28 1/24 baricitinib->  Interim history/subjective:  1/29:  Current sedation fentanyl 125, Precedex increased thia am. Multiple boluses required FiO2 0.55, PEEP 10 I/O+ 3 L total   Objective   Blood pressure 132/84, pulse 70, temperature 99.4 F (37.4 C), temperature source Oral, resp. rate (!) 28, height 5\' 4"  (1.626 m), weight 103.3 kg, SpO2 90 %.    Vent Mode: PRVC FiO2 (%):  [50 %-55 %] 55 % Set Rate:  [28 bmp] 28 bmp Vt Set:  [430 mL] 430 mL PEEP:  [10 cmH20] 10 cmH20 Plateau Pressure:  [22 cmH20-24 cmH20] 24 cmH20   Intake/Output Summary (Last 24 hours) at 12/17/2019 0736 Last data filed at 12/17/2019 0730 Gross per 24 hour  Intake 3853.83 ml  Output 6270 ml  Net -2416.17 ml   Filed Weights   12/13/19 0315 12/15/19 0432 12/17/19 0356  Weight: 100.5 kg 102.4 kg 103.3 kg    Examination:  General:  Elderly, ill appearing HENT: ETT in position, no secretions PULM: coarse B CV: iireg irreg, adequate rate control GI: + BS, rectal tube MSK: normal bulk and tone Neuro: either sedated or agitated with tremor, with stim  Resolved Hospital  Problem list     Assessment & Plan:  COVID pneumonia causing severe acute on chronic hypoxic respiratory failure Mechanical ventilation via ARDS protocol, target PRVC 6 cc/kg Wean PEEP and FiO2 as able, currently 0.55, PEEP 10 Goal plateau pressure less than 30, driving pressure less than 15 Paralytics if necessary for vent synchrony, gas exchange Cycle prone positioning if necessary for oxygenation Deep sedation per PAD protocol, goal RASS -4, currently fentanyl, propofol infusions Diuresis as blood pressure and renal function can tolerate, goal CVP 5-8.  Diuresis given 1/28, slight rise in serum creatinine 1/29, likely can tolerate repeat dosing 1/29 VAP prevention order set  H/o copd with chronic hypoxemic respiratory failure (2 to 3 L at baseline):  On chronic steroids at baseline, currently dexamethasone Bronchodilators: Albuterol as needed  Sepsis in setting of COVID infection: lactic acid normal Follow hemodynamics, currently stable Procalcitonin trend reassuring Follow culture data  Hypotension (in setting of propfol use, now off) Continue Precedex and fentanyl for sedation Follow telemetry for bradycardia, tolerated currently Home antihypertensive regimen restarted 1/28 as below  Need for sedation for mechanical ventilation Acute metabolic encephalopathy due to hypoxemia from COVID At risk delirium on high-dose steroids PAD protocol, Precedex, fentanyl, Versed as needed Home BuSpar, Elavil, Neurontin Consider Seroquel depending on course  AKI resolved, on  CKD3a, slight worsening 1/29 with diuresis: Follow urine output, BMP Diuresis as she can tolerate from hemodynamic standpoint, renal standpoint  Hyperkalemia:  Follow BMP  CAD On home pravastatin HFpEF:  Follow volume status, diurese as she can tolerate Essential Hypertension currently normotensive Home amlodipine, isosorbide mono nitrate, propranolol  Persistent Atrial fib Eliquis per pharmacy  dosing  DM2 with Hyperglycemia: A1c: 8 Sliding-scale insulin per protocol Levemir 10 U daily Tube feed coverage 2 units every 4 hours  Thrombocytopenia, stabilizing:  Follow CBC  Tremors with  ? myoclonus when sedation lifted:  Home propranolol restarted 1/25 EEG reassuring, no evidence epileptic activity Slowly decrease sedating medications as she can tolerate   CODE STATUS: DNR  Best practice:  Diet: tube feeding and free water Pain/Anxiety/Delirium protocol (if indicated): yes, as above VAP protocol (if indicated): yes DVT prophylaxis: Eliquis GI prophylaxis: famotidine Glucose control: SSI q4h accuchecks, tf coverage Mobility: bed rest Code Status: DNR Family Communication: Updated patient's son [name] on 1/29 Disposition: remain in ICU  Labs   CBC: Recent Labs  Lab 12/07/2019 0634 12/04/2019 1316 12/18/2019 1330 11/26/2019 1330 12/13/19 0604 12/14/19 1140 12/15/19 0450 12/16/19 0500 12/17/19 0359  WBC 9.9  --  8.8   < > 5.2 11.2* 8.9 7.7 12.1*  NEUTROABS 9.1*  --  8.0*  --   --   --   --   --   --   HGB 12.6   < > 13.0   < > 13.4 14.1 12.7 14.6 12.5  HCT 42.5   < > 44.9   < > 43.1 46.2* 41.8 46.6* 39.9  MCV 103.9*  --  103.7*   < > 98.9 99.4 99.8 99.6 98.0  PLT 173  --  158   < > 133* 145* 138* 138* 139*   < > = values in this interval not displayed.  Basic Metabolic Panel: Recent Labs  Lab 12/13/19 0604 12/13/19 0604 12/13/19 1620 12/14/19 0604 12/15/19 0450 12/16/19 0500 12/17/19 0359  NA 139  --   --  142 141 141 139  K 4.6  --   --  4.6 4.1 5.2* 4.1  CL 106  --   --  107 105 106 101  CO2 23  --   --  25 30 30 28   GLUCOSE 249*  --   --  239* 178* 242* 182*  BUN 49*  --   --  60* 58* 64* 68*  CREATININE 1.22*  --   --  1.23* 1.12* 1.21* 1.36*  CALCIUM 8.3*  --   --  8.4* 7.7* 8.4* 7.9*  MG 1.9   < > 1.9 2.0 1.8 2.3 1.9  PHOS 2.7   < > 3.3 2.9 3.0 3.5 3.3   < > = values in this interval not displayed.   GFR: Estimated Creatinine Clearance:  37.3 mL/min (A) (by C-G formula based on SCr of 1.36 mg/dL (H)). Recent Labs  Lab 12/11/2019 0636 12/11/2019 0900 11/22/2019 1330 12/13/19 0604 12/13/19 0604 12/14/19 0604 12/14/19 1140 12/15/19 0450 12/16/19 0500 12/17/19 0359  PROCALCITON  --   --   --  0.41  --  0.37  --  0.23  --   --   WBC  --   --    < > 5.2   < >  --  11.2* 8.9 7.7 12.1*  LATICACIDVEN 2.5* 1.1  --   --   --   --   --   --   --   --    < > = values in this interval not displayed.    Liver Function Tests: Recent Labs  Lab 12/14/2019 1330 12/14/19 0604 12/15/19 0450 12/16/19 0500 12/17/19 0359  AST 29 26 32 32 30  ALT 28 33 35 44 51*  ALKPHOS 48 54 43 54 45  BILITOT 1.0 0.3 0.8 0.5 0.8  PROT 6.0* 6.2* 5.0* 5.9* 5.2*  ALBUMIN 3.1* 2.9* 2.4* 2.9* 2.6*   No results for input(s): LIPASE, AMYLASE in the last 168 hours. No results for input(s): AMMONIA in the last 168 hours.  ABG    Component Value Date/Time   PHART 7.373 11/22/2019 1316   PCO2ART 43.4 11/20/2019 1316   PO2ART 86.0 11/26/2019 1316   HCO3 25.3 12/11/2019 1316   TCO2 27 11/30/2019 1316   O2SAT 96.0 11/21/2019 1316     Coagulation Profile: Recent Labs  Lab 12/19/2019 0634  INR 1.4*    Cardiac Enzymes: No results for input(s): CKTOTAL, CKMB, CKMBINDEX, TROPONINI in the last 168 hours.  HbA1C: Hgb A1c MFr Bld  Date/Time Value Ref Range Status  12/09/2019 05:05 PM 8.0 (H) 4.8 - 5.6 % Final    Comment:    (NOTE) Pre diabetes:          5.7%-6.4% Diabetes:              >6.4% Glycemic control for   <7.0% adults with diabetes   10/06/2017 04:47 AM 6.5 (H) 4.8 - 5.6 % Final    Comment:    (NOTE) Pre diabetes:          5.7%-6.4% Diabetes:              >6.4% Glycemic control for   <7.0% adults with diabetes     CBG: Recent Labs  Lab 12/16/19 0901 12/16/19 1124 12/16/19 1646 12/16/19 2307 12/17/19 0336  GLUCAP 233*  267* 195* 199* 157*          Critical care time: The patient is critically ill with multiple organ  systems failure and requires high complexity decision making for assessment and support, frequent evaluation and titration of therapies, application of advanced monitoring technologies and extensive interpretation of multiple databases.  Critical care time 33 mins. This represents my time independent of the NPs time taking care of the pt. This is excluding procedures.     Baltazar Apo, MD, PhD 12/17/2019, 1:52 PM [facility] [phone] or if no answer [phone]

## 2019-12-18 ENCOUNTER — Inpatient Hospital Stay (HOSPITAL_COMMUNITY): Payer: Medicare Other

## 2019-12-18 DIAGNOSIS — N179 Acute kidney failure, unspecified: Secondary | ICD-10-CM

## 2019-12-18 DIAGNOSIS — G9340 Encephalopathy, unspecified: Secondary | ICD-10-CM

## 2019-12-18 LAB — GLUCOSE, CAPILLARY
Glucose-Capillary: 139 mg/dL — ABNORMAL HIGH (ref 70–99)
Glucose-Capillary: 174 mg/dL — ABNORMAL HIGH (ref 70–99)
Glucose-Capillary: 192 mg/dL — ABNORMAL HIGH (ref 70–99)
Glucose-Capillary: 221 mg/dL — ABNORMAL HIGH (ref 70–99)
Glucose-Capillary: 229 mg/dL — ABNORMAL HIGH (ref 70–99)
Glucose-Capillary: 304 mg/dL — ABNORMAL HIGH (ref 70–99)

## 2019-12-18 LAB — COMPREHENSIVE METABOLIC PANEL
ALT: 53 U/L — ABNORMAL HIGH (ref 0–44)
AST: 24 U/L (ref 15–41)
Albumin: 2.6 g/dL — ABNORMAL LOW (ref 3.5–5.0)
Alkaline Phosphatase: 45 U/L (ref 38–126)
Anion gap: 6 (ref 5–15)
BUN: 65 mg/dL — ABNORMAL HIGH (ref 8–23)
CO2: 33 mmol/L — ABNORMAL HIGH (ref 22–32)
Calcium: 8.2 mg/dL — ABNORMAL LOW (ref 8.9–10.3)
Chloride: 104 mmol/L (ref 98–111)
Creatinine, Ser: 1.38 mg/dL — ABNORMAL HIGH (ref 0.44–1.00)
GFR calc Af Amer: 41 mL/min — ABNORMAL LOW (ref >60)
GFR calc non Af Amer: 36 mL/min — ABNORMAL LOW (ref >60)
Glucose, Bld: 204 mg/dL — ABNORMAL HIGH (ref 70–99)
Potassium: 4.3 mmol/L (ref 3.5–5.1)
Sodium: 143 mmol/L (ref 135–145)
Total Bilirubin: 0.6 mg/dL (ref 0.3–1.2)
Total Protein: 5.5 g/dL — ABNORMAL LOW (ref 6.5–8.1)

## 2019-12-18 LAB — CBC
HCT: 46.9 % — ABNORMAL HIGH (ref 36.0–46.0)
Hemoglobin: 14.3 g/dL (ref 12.0–15.0)
MCH: 30 pg (ref 26.0–34.0)
MCHC: 30.5 g/dL (ref 30.0–36.0)
MCV: 98.5 fL (ref 80.0–100.0)
Platelets: 161 10*3/uL (ref 150–400)
RBC: 4.76 MIL/uL (ref 3.87–5.11)
RDW: 12.7 % (ref 11.5–15.5)
WBC: 13.4 10*3/uL — ABNORMAL HIGH (ref 4.0–10.5)
nRBC: 0.1 % (ref 0.0–0.2)

## 2019-12-18 LAB — PHOSPHORUS: Phosphorus: 3.7 mg/dL (ref 2.5–4.6)

## 2019-12-18 LAB — MAGNESIUM: Magnesium: 2.2 mg/dL (ref 1.7–2.4)

## 2019-12-18 MED ORDER — SODIUM CHLORIDE 0.9% FLUSH
10.0000 mL | Freq: Two times a day (BID) | INTRAVENOUS | Status: DC
  Administered 2019-12-18 – 2019-12-22 (×9): 10 mL
  Administered 2019-12-22: 30 mL
  Administered 2019-12-23 – 2019-12-27 (×8): 10 mL

## 2019-12-18 MED ORDER — SODIUM CHLORIDE 0.9% FLUSH: 10.0000 mL | INTRAVENOUS | Status: DC | PRN

## 2019-12-18 NOTE — Progress Notes (Signed)
Update given to son over the phone

## 2019-12-18 NOTE — Progress Notes (Signed)
NAME:  [name], MRN:  [id number], DOB:  30-May-1935, LOS: 6 ADMISSION DATE:  11/21/2019, CONSULTATION DATE:   December 12, 2019 REFERRING MD: Dr. Owens Shark, CHIEF COMPLAINT:  Brought in by EMS confused, hypoxemic  Brief History   85 year old NHR recently diagnosed with Covid pneumonia brought to the [facility] emergency room combative hypoxemic on January 24 .  Required intubation, transferred to [facility] for further management.    Past Medical History  Squamous cell carcinoma of the nose COPD on 2 L oxygen Coronary artery disease  Eliquis for atrial fibrillation. Hyperlipidemia Hypertension GERD Osteoarthritis  Significant Hospital Events   1/24 intubation/admit 1/25: remains intubated sedated. Some agitation reported overnight. On precedex infusion. afib with rvr overnight but now rate controlled.  1/26:  Pt with tremulous/jerking-like motion when sedation weaned. Family states has baseline tremor for which she is on propanolol. This was restarted yesterday late morning and eeg ordered. Overnight still with episodes so was started on keppra and prn versed. eeg still pending. tmax 102.7 overnight. On 50%/10 on PRVC. All labs still pending this am 1/27: pt lost iv access yesterday so cvc placed. Also appears to have had more episodes yesterday that were seizure like but consistent with episode noted on eeg that is NOT a seizure. ? pseudosz activity although pt is on sedation so less likely. Also known to have tremors per pt's family although these episodes seem more pronounced. Diarrhea overnight as well as temp to 102.9    Consults:    Procedures:  1/24 ETT >  1/26 RIJ cvc->  Significant Diagnostic Tests:  1/8 cth: No acute intracranial abnormality.   Findings consistent with age related atrophy and chronic small vessel ischemia   Small unchanged lacunar infarct in the left basal ganglia. 1/9 MRI brain: 1. Severely motion degraded examination. 2. No acute infarct  identified. 3. Chronic left basal ganglia lacunar infarcts. 1/11 ECHO:  [id number] 0000000 Grade 1 diastolic dysfunction  99991111 eeg: Severe diffuse encephalopathy, no EEG correlate with upward deviated eyes and whole body shaking, no seizure activity      Micro Data:  1/24 POC SARS CoV2 > positive 1/24 urine: ngtd 1/24 blood: ngtd 1/26 resp: Normal flora  Antimicrobials:  1/24 decadron >  1/24 remdesivir > 1/28 1/24 baricitinib->  Interim history/subjective:   Critically ill, intubated On four-point restraints, severely agitated per RN, Versed seems to work better Good urine output Afebrile   Objective   Blood pressure (!) 141/68, pulse 86, temperature 98.2 F (36.8 C), temperature source Axillary, resp. rate 12, height 5\' 4"  (1.626 m), weight 102.1 kg, SpO2 92 %.    Vent Mode: CPAP;PSV FiO2 (%):  [55 %] 55 % Set Rate:  [28 bmp] 28 bmp Vt Set:  [430 mL] 430 mL PEEP:  [10 cmH20] 10 cmH20 Pressure Support:  [10 cmH20] 10 cmH20 Plateau Pressure:  [22 cmH20-24 cmH20] 23 cmH20   Intake/Output Summary (Last 24 hours) at 12/18/2019 1211 Last data filed at 12/18/2019 1100 Gross per 24 hour  Intake 2519.08 ml  Output 1910 ml  Net 609.08 ml   Filed Weights   12/15/19 0432 12/17/19 0356 12/18/19 0500  Weight: 102.4 kg 103.3 kg 102.1 kg    Examination:  General:  Elderly, critically ill appearing, sedated, in restraints HENT: ETT oral, mild pallor, no icterus no JVD PULM: coarse B CV: iireg irreg, adequate rate control GI: + BS, rectal tube MSK: normal bulk and tone Neuro: either sedated or agitated per RN, currently sedated,  RASS -3  Labs show normal electrolytes, stable creatinine, mild leukocytosis  Chest x-ray 1/30 personally reviewed which shows bilateral patchy infiltrates with slight worsening at the right lung base  Resolved Hospital Problem list    Hypotension -in setting of propfol use  Assessment & Plan:  COVID pneumonia/ ARDS  Mechanical ventilation via  ARDS protocol, target PRVC 6 cc/kg Wean PEEP and FiO2 as able, currently 55% PEEP 10 Goal plateau pressure less than 30, driving pressure less than 15 Does not need paralytics or prone position anymore Diuresis as blood pressure and renal function can tolerate, goal CVP 5-8.  VAP prevention order set -Not ready to wean yet  H/o copd with chronic hypoxemic respiratory failure (2 to 3 L at baseline):  12/2017FEV1 0.92 (44%), last seen in office by Dr. Vaughan Browner 12/2018, not on steroids then  ?On chronic steroids at baseline, currently dexamethasone Bronchodilators: Albuterol as needed -1/30 worsening chest x-ray, low threshold to add antibiotics if fever, leukocytosis   Acute metabolic encephalopathy due to hypoxemia from COVID At risk delirium  PAD protocol, Precedex, fentanyl, Versed as needed , goal RASS 0 to -1 Home BuSpar, Elavil, Neurontin Consider Seroquel depending on course  AKI on  CKD3 - baseline cr 1.1 range Follow urine output, BMP Lasix 40 daily   CAD On home pravastatin HFpEF:  Follow volume status, diurese as she can tolerate Essential Hypertension currently normotensive Home amlodipine, isosorbide mono nitrate  Persistent Atrial fib Eliquis per pharmacy dosing  DM2 with Hyperglycemia: A1c: 8 Sliding-scale insulin per protocol Levemir 10 U daily Tube feed coverage 2 units every 4 hours  Thrombocytopenia, stabilizing:  Follow CBC  Tremors with  ? myoclonus when sedation lifted:  Home propranolol restarted 1/25 EEG reassuring, no evidence epileptic activity   CODE STATUS: DNR  Best practice:  Diet: tube feeding and free water Pain/Anxiety/Delirium protocol (if indicated): yes, as above VAP protocol (if indicated): yes DVT prophylaxis: Eliquis GI prophylaxis: famotidine Glucose control: SSI q4h accuchecks, tf coverage Mobility: bed rest Code Status: DNR Family Communication:  patient's son [name] on 1/29 Disposition:  ICU   The patient is  critically ill with multiple organ systems failure and requires high complexity decision making for assessment and support, frequent evaluation and titration of therapies, application of advanced monitoring technologies and extensive interpretation of multiple databases. Critical Care Time devoted to patient care services described in this note independent of APP/resident  time is 35 minutes.    Kara Mead MD. Shade Flood. [facility]  If no response to pager , please call [phone]   12/18/2019

## 2019-12-18 NOTE — Progress Notes (Signed)
RT note: Placed patient on vent rest mode do to increased agitation, rate and desats. Bedside RN aware.

## 2019-12-19 ENCOUNTER — Inpatient Hospital Stay (HOSPITAL_COMMUNITY): Payer: Medicare Other

## 2019-12-19 LAB — BASIC METABOLIC PANEL
Anion gap: 10 (ref 5–15)
BUN: 66 mg/dL — ABNORMAL HIGH (ref 8–23)
CO2: 30 mmol/L (ref 22–32)
Calcium: 8.2 mg/dL — ABNORMAL LOW (ref 8.9–10.3)
Chloride: 101 mmol/L (ref 98–111)
Creatinine, Ser: 1.15 mg/dL — ABNORMAL HIGH (ref 0.44–1.00)
GFR calc Af Amer: 51 mL/min — ABNORMAL LOW (ref >60)
GFR calc non Af Amer: 44 mL/min — ABNORMAL LOW (ref >60)
Glucose, Bld: 275 mg/dL — ABNORMAL HIGH (ref 70–99)
Potassium: 4.9 mmol/L (ref 3.5–5.1)
Sodium: 141 mmol/L (ref 135–145)

## 2019-12-19 LAB — GLUCOSE, CAPILLARY
Glucose-Capillary: 165 mg/dL — ABNORMAL HIGH (ref 70–99)
Glucose-Capillary: 196 mg/dL — ABNORMAL HIGH (ref 70–99)
Glucose-Capillary: 197 mg/dL — ABNORMAL HIGH (ref 70–99)
Glucose-Capillary: 206 mg/dL — ABNORMAL HIGH (ref 70–99)
Glucose-Capillary: 212 mg/dL — ABNORMAL HIGH (ref 70–99)
Glucose-Capillary: 213 mg/dL — ABNORMAL HIGH (ref 70–99)

## 2019-12-19 LAB — CBC
HCT: 43.2 % (ref 36.0–46.0)
Hemoglobin: 13 g/dL (ref 12.0–15.0)
MCH: 30.2 pg (ref 26.0–34.0)
MCHC: 30.1 g/dL (ref 30.0–36.0)
MCV: 100.5 fL — ABNORMAL HIGH (ref 80.0–100.0)
Platelets: 166 10*3/uL (ref 150–400)
RBC: 4.3 MIL/uL (ref 3.87–5.11)
RDW: 12.8 % (ref 11.5–15.5)
WBC: 11.4 10*3/uL — ABNORMAL HIGH (ref 4.0–10.5)
nRBC: 0 % (ref 0.0–0.2)

## 2019-12-19 LAB — MAGNESIUM: Magnesium: 2.3 mg/dL (ref 1.7–2.4)

## 2019-12-19 LAB — PHOSPHORUS: Phosphorus: 3.3 mg/dL (ref 2.5–4.6)

## 2019-12-19 MED ORDER — INSULIN ASPART 100 UNIT/ML ~~LOC~~ SOLN
5.0000 [IU] | SUBCUTANEOUS | Status: DC
  Administered 2019-12-19 – 2019-12-27 (×41): 5 [IU] via SUBCUTANEOUS

## 2019-12-19 MED ORDER — INSULIN DETEMIR 100 UNIT/ML ~~LOC~~ SOLN
10.0000 [IU] | Freq: Two times a day (BID) | SUBCUTANEOUS | Status: DC
  Administered 2019-12-19 – 2019-12-20 (×2): 10 [IU] via SUBCUTANEOUS
  Filled 2019-12-19 (×3): qty 0.1

## 2019-12-19 MED ORDER — PROPRANOLOL HCL 20 MG PO TABS
20.0000 mg | ORAL_TABLET | Freq: Two times a day (BID) | ORAL | Status: DC
  Administered 2019-12-19 – 2019-12-20 (×2): 20 mg via ORAL
  Filled 2019-12-19 (×2): qty 1

## 2019-12-19 MED ORDER — MOMETASONE FURO-FORMOTEROL FUM 100-5 MCG/ACT IN AERO
2.0000 | INHALATION_SPRAY | Freq: Two times a day (BID) | RESPIRATORY_TRACT | Status: DC
  Filled 2019-12-19: qty 8.8

## 2019-12-19 NOTE — Progress Notes (Signed)
NAME:  [name], MRN:  [id number], DOB:  August 31, 1935, LOS: 7 ADMISSION DATE:  12/10/2019, CONSULTATION DATE:   December 12, 2019 REFERRING MD: Dr. Owens Shark, CHIEF COMPLAINT:  Brought in by EMS confused, hypoxemic  Brief History   85 year old NHR recently diagnosed with Covid pneumonia brought to the [facility] emergency room combative hypoxemic on January 24 .  Required intubation, transferred to [facility] for further management.    Past Medical History  Squamous cell carcinoma of the nose COPD on 2 L oxygen Coronary artery disease  Eliquis for atrial fibrillation. Hyperlipidemia Hypertension GERD Osteoarthritis  Significant Hospital Events   1/24 intubation/admit 1/25:  Some agitation reported overnight. On precedex infusion. afib with rvr overnight but now rate controlled.  1/26:  Pt with tremulous/jerking-like motion when sedation weaned. Family states has baseline tremor for which she is on propanolol. This was restarted yesterday late morning and eeg ordered. Overnight still with episodes so was started on keppra and prn versed. eeg still pending. tmax 102.7 overnight. On 50%/10 on PRVC. All labs still pending this am 1/27: pt lost iv access yesterday so cvc placed. Also appears to have had more episodes yesterday that were seizure like but consistent with episode noted on eeg that is NOT a seizure. ? pseudosz activity although pt is on sedation so less likely. Also known to have tremors per pt's family although these episodes seem more pronounced. Diarrhea overnight as well as temp to 102.9    Consults:    Procedures:  1/24 ETT >  1/26 RIJ cvc->  Significant Diagnostic Tests:  1/8 cth: No acute intracranial abnormality.   Findings consistent with age related atrophy and chronic small vessel ischemia   Small unchanged lacunar infarct in the left basal ganglia. 1/9 MRI brain: 1. Severely motion degraded examination. 2. No acute infarct identified. 3. Chronic  left basal ganglia lacunar infarcts. 1/11 ECHO:  [id number] 0000000 Grade 1 diastolic dysfunction  [id number] eeg: Severe diffuse encephalopathy, no EEG correlate with upward deviated eyes and whole body shaking, no seizure activity      Micro Data:  1/24 POC SARS CoV2 > positive 1/24 urine: ngtd 1/24 blood: ngtd 1/26 resp: Normal flora  Antimicrobials:  1/24 decadron >  1/24 remdesivir > 1/28 1/24 baricitinib->  Interim history/subjective:   Critically ill, intubated Less agitation today on Precedex/fentanyl drips Afebrile   Objective   Blood pressure (!) 150/91, pulse 90, temperature 98.8 F (37.1 C), temperature source Oral, resp. rate 16, height 5\' 4"  (1.626 m), weight 102.2 kg, SpO2 90 %. CVP:  [12 mmHg-13 mmHg] 13 mmHg  Vent Mode: CPAP;PSV FiO2 (%):  [50 %-55 %] 50 % Set Rate:  [28 bmp] 28 bmp Vt Set:  [430 mL] 430 mL PEEP:  [10 cmH20] 10 cmH20 Pressure Support:  [8 cmH20-10 cmH20] 8 cmH20 Plateau Pressure:  [13 cmH20-23 cmH20] 23 cmH20   Intake/Output Summary (Last 24 hours) at 12/19/2019 1104 Last data filed at 12/19/2019 0900 Gross per 24 hour  Intake 2399.27 ml  Output 1540 ml  Net 859.27 ml   Filed Weights   12/17/19 0356 12/18/19 0500 12/19/19 0500  Weight: 103.3 kg 102.1 kg 102.2 kg    Examination:  General:  Elderly, critically ill appearing, sedated, mittens on HENT: ETT oral, mild pallor, no icterus no JVD PULM: coarse B ventilated breath sounds CV: iireg irreg, adequate rate control GI: + BS, rectal tube MSK: normal bulk and tone Neuro: RASS -1, opens eyes, did not follow commands for  me  Labs show normal electrolytes, decreasing creatinine, elevated glucose, decreasing leukocytosis  Chest x-ray 1/31 personally reviewed which shows no change in bilateral interstitial opacities  Resolved Hospital Problem list    Hypotension -in setting of propfol use  Assessment & Plan:  COVID pneumonia/ ARDS  Mechanical ventilation via ARDS protocol, target  PRVC 6 cc/kg Wean PEEP and FiO2 as able, currently 50%/PEEP of 10 Goal plateau pressure less than 30, driving pressure less than 15 -1/31 tolerating spontaneous breathing trial on pressure support 8/10 Diuresis as blood pressure and renal function can tolerate, goal CVP 5-8.  VAP prevention order set Chest x-ray worsened 1/30 but no evidence of HAP  H/o copd with chronic hypoxemic respiratory failure (2 to 3 L at baseline):  12/2017FEV1 0.92 (44%), last seen in office by Dr. Vaughan Browner 12/2018, not on steroids then  ?On chronic steroids at baseline, currently dexamethasone Bronchodilators: Albuterol as needed   Acute metabolic encephalopathy due to hypoxemia from COVID At risk delirium  PAD protocol, Precedex to continue, minimize fentanyl, Versed as needed , goal RASS 0 to -1 Home BuSpar, Elavil, Neurontin Consider Seroquel depending on course  AKI on  CKD3 - baseline cr 1.1 range Follow urine output, BMP Lasix 40 daily   CAD On home pravastatin HFpEF:  Follow volume status, diurese as she can tolerate Essential Hypertension currently normotensive Home amlodipine, isosorbide mono nitrate  Persistent Atrial fib Eliquis per pharmacy dosing  DM2 with Hyperglycemia: A1c: 8 Sliding-scale insulin per protocol Levemir 10 U daily Tube feed coverage 2 units every 4 hours  Thrombocytopenia, stable  Follow CBC  Tremors with  ? myoclonus when sedation lifted:  Home propranolol restarted 1/25 EEG reassuring, no evidence epileptic activity   CODE STATUS: DNR , discussed with son [name] and explained concept of reintubation, he agreed that this would not be in mom's best interest  Best practice:  Diet: tube feeding and free water Pain/Anxiety/Delirium protocol (if indicated): yes, as above VAP protocol (if indicated): yes DVT prophylaxis: Eliquis GI prophylaxis: famotidine Glucose control: SSI q4h accuchecks, tf coverage Mobility: bed rest Code Status: DNR Family  Communication:  patient's son [name] on 1/31  Disposition:  ICU   The patient is critically ill with multiple organ systems failure and requires high complexity decision making for assessment and support, frequent evaluation and titration of therapies, application of advanced monitoring technologies and extensive interpretation of multiple databases. Critical Care Time devoted to patient care services described in this note independent of APP/resident  time is 33 minutes.     Kara Mead MD. Shade Flood. [facility]  If no response to pager , please call [phone]   12/19/2019

## 2019-12-19 NOTE — Progress Notes (Signed)
Pt placed back on full vent support due to desats even with suction and sedation given.  PT is tolerating well, sats are 90%.  RT will continue to monitor.

## 2019-12-20 ENCOUNTER — Inpatient Hospital Stay (HOSPITAL_COMMUNITY): Payer: Medicare Other

## 2019-12-20 LAB — CBC
HCT: 44.4 % (ref 36.0–46.0)
Hemoglobin: 13.2 g/dL (ref 12.0–15.0)
MCH: 30.2 pg (ref 26.0–34.0)
MCHC: 29.7 g/dL — ABNORMAL LOW (ref 30.0–36.0)
MCV: 101.6 fL — ABNORMAL HIGH (ref 80.0–100.0)
Platelets: 190 10*3/uL (ref 150–400)
RBC: 4.37 MIL/uL (ref 3.87–5.11)
RDW: 12.8 % (ref 11.5–15.5)
WBC: 13.4 10*3/uL — ABNORMAL HIGH (ref 4.0–10.5)
nRBC: 0 % (ref 0.0–0.2)

## 2019-12-20 LAB — BASIC METABOLIC PANEL
Anion gap: 7 (ref 5–15)
BUN: 69 mg/dL — ABNORMAL HIGH (ref 8–23)
CO2: 30 mmol/L (ref 22–32)
Calcium: 8.3 mg/dL — ABNORMAL LOW (ref 8.9–10.3)
Chloride: 104 mmol/L (ref 98–111)
Creatinine, Ser: 1.26 mg/dL — ABNORMAL HIGH (ref 0.44–1.00)
GFR calc Af Amer: 46 mL/min — ABNORMAL LOW (ref >60)
GFR calc non Af Amer: 40 mL/min — ABNORMAL LOW (ref >60)
Glucose, Bld: 216 mg/dL — ABNORMAL HIGH (ref 70–99)
Potassium: 5.1 mmol/L (ref 3.5–5.1)
Sodium: 141 mmol/L (ref 135–145)

## 2019-12-20 LAB — GLUCOSE, CAPILLARY
Glucose-Capillary: 112 mg/dL — ABNORMAL HIGH (ref 70–99)
Glucose-Capillary: 119 mg/dL — ABNORMAL HIGH (ref 70–99)
Glucose-Capillary: 170 mg/dL — ABNORMAL HIGH (ref 70–99)
Glucose-Capillary: 178 mg/dL — ABNORMAL HIGH (ref 70–99)
Glucose-Capillary: 182 mg/dL — ABNORMAL HIGH (ref 70–99)
Glucose-Capillary: 209 mg/dL — ABNORMAL HIGH (ref 70–99)
Glucose-Capillary: 266 mg/dL — ABNORMAL HIGH (ref 70–99)
Glucose-Capillary: 64 mg/dL — ABNORMAL LOW (ref 70–99)

## 2019-12-20 LAB — MAGNESIUM: Magnesium: 2.3 mg/dL (ref 1.7–2.4)

## 2019-12-20 LAB — PHOSPHORUS: Phosphorus: 3.4 mg/dL (ref 2.5–4.6)

## 2019-12-20 MED ORDER — CHLORHEXIDINE GLUCONATE 0.12 % MT SOLN
15.0000 mL | Freq: Two times a day (BID) | OROMUCOSAL | Status: DC
  Administered 2019-12-20 – 2019-12-22 (×4): 15 mL via OROMUCOSAL
  Filled 2019-12-20 (×4): qty 15

## 2019-12-20 MED ORDER — ISOSORBIDE MONONITRATE 10 MG PO TABS
10.0000 mg | ORAL_TABLET | Freq: Two times a day (BID) | ORAL | Status: DC
  Administered 2019-12-20 – 2019-12-24 (×9): 10 mg
  Filled 2019-12-20 (×10): qty 1

## 2019-12-20 MED ORDER — BUSPIRONE HCL 10 MG PO TABS
10.0000 mg | ORAL_TABLET | Freq: Two times a day (BID) | ORAL | Status: DC
  Administered 2019-12-20 – 2019-12-27 (×14): 10 mg
  Filled 2019-12-20 (×15): qty 1

## 2019-12-20 MED ORDER — FUROSEMIDE 10 MG/ML IJ SOLN
40.0000 mg | Freq: Once | INTRAMUSCULAR | Status: AC
  Administered 2019-12-20: 40 mg via INTRAVENOUS
  Filled 2019-12-20: qty 4

## 2019-12-20 MED ORDER — PROPRANOLOL HCL 20 MG PO TABS
20.0000 mg | ORAL_TABLET | Freq: Two times a day (BID) | ORAL | Status: DC
  Administered 2019-12-20 – 2019-12-24 (×9): 20 mg
  Filled 2019-12-20 (×10): qty 1

## 2019-12-20 MED ORDER — DEXTROSE 50 % IV SOLN: 25.0000 mL | Freq: Once | INTRAVENOUS | Status: AC

## 2019-12-20 MED ORDER — QUETIAPINE FUMARATE 50 MG PO TABS
50.0000 mg | ORAL_TABLET | Freq: Two times a day (BID) | ORAL | Status: DC
  Administered 2019-12-20: 50 mg via ORAL
  Filled 2019-12-20: qty 1

## 2019-12-20 MED ORDER — QUETIAPINE FUMARATE 50 MG PO TABS
50.0000 mg | ORAL_TABLET | Freq: Two times a day (BID) | ORAL | Status: DC
  Administered 2019-12-20 – 2019-12-27 (×14): 50 mg
  Filled 2019-12-20 (×14): qty 1

## 2019-12-20 MED ORDER — FAMOTIDINE 40 MG/5ML PO SUSR
20.0000 mg | Freq: Every day | ORAL | Status: DC
  Administered 2019-12-21 – 2019-12-27 (×7): 20 mg via ORAL
  Filled 2019-12-20 (×7): qty 2.5

## 2019-12-20 MED ORDER — PRAVASTATIN SODIUM 40 MG PO TABS
40.0000 mg | ORAL_TABLET | Freq: Every day | ORAL | Status: DC
  Administered 2019-12-21 – 2019-12-27 (×7): 40 mg
  Filled 2019-12-20 (×7): qty 1

## 2019-12-20 MED ORDER — INSULIN DETEMIR 100 UNIT/ML ~~LOC~~ SOLN
15.0000 [IU] | Freq: Two times a day (BID) | SUBCUTANEOUS | Status: DC
  Administered 2019-12-20 – 2019-12-22 (×4): 15 [IU] via SUBCUTANEOUS
  Filled 2019-12-20 (×5): qty 0.15

## 2019-12-20 MED ORDER — DEXTROSE 50 % IV SOLN
INTRAVENOUS | Status: AC
  Administered 2019-12-20: 25 mL via INTRAVENOUS
  Filled 2019-12-20: qty 50

## 2019-12-20 MED ORDER — AMLODIPINE BESYLATE 5 MG PO TABS
10.0000 mg | ORAL_TABLET | Freq: Every day | ORAL | Status: DC
  Administered 2019-12-21 – 2019-12-23 (×3): 10 mg
  Filled 2019-12-20 (×4): qty 2

## 2019-12-20 NOTE — Progress Notes (Signed)
Nutrition Follow-up  DOCUMENTATION CODES:   Obesity unspecified  INTERVENTION:   Pivot 1.5 Cal @ 50 ml/h (1200 ml per day)   Prostat 30 ml once daily   Provides 1900 kcals, 127 gm protein, 910.8 ml free water daily.  Continue 280mL free water flushes Q4, providing a total of 2110.35mL free water per day.  NUTRITION DIAGNOSIS:   Inadequate oral intake related to inability to eat as evidenced by NPO status.   GOAL:   Patient will meet greater than or equal to 90% of their needs   MONITOR:   Vent status, TF tolerance, Weight trends, Labs, I & O's  REASON FOR ASSESSMENT:   Consult Enteral/tube feeding initiation and management  ASSESSMENT:   Pt with a PMH significant for COPD, CAD, HLD, HTN, GERD, OA, and recently dx with Covid-19 PNA brought into ED hypoxic and combative and required intubation.  Per CCM pt weaning on vent.  Pt with moderate edema and is 9 L positive   1/24 - pt intubated  Medications reviewed and include: Decadron, SSI, 5 units novolog every 4 hrs, 15 units levemir BID 200 ml free water every 4 hours = 1200 ml  Labs reviewed: CBGs [phone]  Patient is currently intubated on ventilator support MV: 10.1 L/min Temp (24hrs), Avg:98.6 F (37 C), Min:97.9 F (36.6 C), Max:99 F (37.2 C)  Diet Order:   Diet Order    None      EDUCATION NEEDS:   Not appropriate for education at this time  Skin:  Skin Assessment: Reviewed RN Assessment  Last BM:  2/1 via rectal tube  Height:   Ht Readings from Last 1 Encounters:  12/13/19 5\' 4"  (1.626 m)    Weight:   Wt Readings from Last 6 Encounters:  12/20/19 103.7 kg  12/11/2019 100.2 kg  12/03/19 98.2 kg  08/05/19 101.2 kg  04/06/19 90.7 kg  12/29/18 92.1 kg     Ideal Body Weight:  54.54 kg  BMI:  Body mass index is 39.24 kg/m.  Estimated Nutritional Needs:   Kcal:  1800-2100  Protein:  120-135 grams  Fluid:  >1.8L/d  Maylon Peppers RD, LDN, CNSC [phone] Pager [phone]  After Hours Pager

## 2019-12-20 NOTE — Progress Notes (Signed)
NAME:  [name], MRN:  [id number], DOB:  09-Apr-1935, LOS: 8 ADMISSION DATE:  11/26/2019, CONSULTATION DATE:   December 12, 2019 REFERRING MD: Dr. Owens Shark, CHIEF COMPLAINT:  Brought in by EMS confused, hypoxemic  Brief History   85 year old NHR recently diagnosed with Covid pneumonia brought to the [facility] emergency room combative hypoxemic on January 24 .  Required intubation, transferred to [facility] for further management.    Past Medical History  Squamous cell carcinoma of the nose COPD on 2 L oxygen Coronary artery disease  Eliquis for atrial fibrillation. Hyperlipidemia Hypertension GERD Osteoarthritis  Significant Hospital Events   1/24 intubation/admit 1/25:  Some agitation reported overnight. On precedex infusion. afib with rvr overnight but now rate controlled.  1/26:  Pt with tremulous/jerking-like motion when sedation weaned. Family states has baseline tremor for which she is on propanolol. This was restarted yesterday late morning and eeg ordered. Overnight still with episodes so was started on keppra and prn versed. eeg still pending. tmax 102.7 overnight. On 50%/10 on PRVC. All labs still pending this am 1/27: pt lost iv access yesterday so cvc placed. Also appears to have had more episodes yesterday that were seizure like but consistent with episode noted on eeg that is NOT a seizure. ? pseudosz activity although pt is on sedation so less likely. Also known to have tremors per pt's family although these episodes seem more pronounced. Diarrhea overnight as well as temp to 102.9 1/30 agitation 1/31 tolerating spontaneous breathing trial on pressure support 8/10   Consults:    Procedures:  1/24 ETT >  1/26 RIJ cvc->  Significant Diagnostic Tests:  1/8 cth: No acute intracranial abnormality.   Findings consistent with age related atrophy and chronic small vessel ischemia   Small unchanged lacunar infarct in the left basal ganglia. 1/9 MRI brain: 1.  Severely motion degraded examination. 2. No acute infarct identified. 3. Chronic left basal ganglia lacunar infarcts. 1/11 ECHO:  [id number] Grade 1 diastolic dysfunction  [id number] eeg: Severe diffuse encephalopathy, no EEG correlate with upward deviated eyes and whole body shaking, no seizure activity      Micro Data:  1/24 POC SARS CoV2 > positive 1/24 urine: ngtd 1/24 blood: ngtd 1/26 resp: Normal flora  Antimicrobials:  1/24 decadron >  1/24 remdesivir > 1/28 1/24 baricitinib->  Interim history/subjective:   Continues to have breakthrough agitation on Precedex/fentanyl requiring intermittent Versed Critically ill, intubated Afebrile   Objective   Blood pressure 138/85, pulse 88, temperature 99 F (37.2 C), temperature source Oral, resp. rate (!) 21, height 5\' 4"  (1.626 m), weight 103.7 kg, SpO2 90 %. CVP:  [9 mmHg-21 mmHg] 18 mmHg  Vent Mode: PRVC FiO2 (%):  [50 %-55 %] 55 % Set Rate:  [28 bmp] 28 bmp Vt Set:  [430 mL] 430 mL PEEP:  [10 cmH20] 10 cmH20 Pressure Support:  [8 cmH20] 8 cmH20 Plateau Pressure:  [1 cmH20-23 cmH20] 9 cmH20   Intake/Output Summary (Last 24 hours) at 12/20/2019 1123 Last data filed at 12/20/2019 1000 Gross per 24 hour  Intake 3017.69 ml  Output 1485 ml  Net 1532.69 ml   Filed Weights   12/18/19 0500 12/19/19 0500 12/20/19 0445  Weight: 102.1 kg 102.2 kg 103.7 kg    Examination:  General:  Elderly, critically ill appearing, sedated, wrist restraints HENT: ETT oral, mild pallor, no icterus no JVD PULM: coarse B ventilated breath sounds CV: iireg irreg, adequate rate control GI: + BS, rectal tube MSK: normal  bulk and tone Neuro: RASS -2, just received Versed, unresponsive  Labs show mild hyperkalemia, elevated sugars, slight increase in creatinine, mild leukocytosis  Chest x-ray 2/1 personally reviewed which shows slight increase in bilateral interstitial opacities  Resolved Hospital Problem list    Hypotension -in setting of  propfol use  Assessment & Plan:  COVID pneumonia/ ARDS  Mechanical ventilation via ARDS protocol, target PRVC 6-8 cc/kg Wean PEEP and FiO2 as able, currently 55%/PEEP of 10 Goal plateau pressure less than 30, driving pressure less than 15 Attempt pressure support weaning again  Diuresis as blood pressure and renal function can tolerate, goal CVP 5-8.  VAP prevention order set Chest x-ray slight worse but no evidence of HAP  H/o copd with chronic hypoxemic respiratory failure (2 to 3 L at baseline):  12/2017FEV1 0.92 (44%), last seen in office by Dr. Vaughan Browner 12/2018, not on steroids then  ?On chronic steroids at baseline, currently dexamethasone Bronchodilators: Albuterol as needed , added Dulera   Acute metabolic encephalopathy due to hypoxemia from COVID At risk delirium  PAD protocol, Precedex to continue, minimize fentanyl, Versed as needed , goal RASS 0 to -1 Home BuSpar, Elavil, Neurontin 2/1 add Seroquel 50 twice daily  AKI on  CKD3 - baseline cr 1.1 range Follow urine output, BMP Lasix 40 x1 -creatinine rising slightly and may limit   CAD , Htn HFpEF:  Follow volume status, diurese as she can tolerate Home amlodipine, isosorbide mono nitrate On home pravastatin  Persistent Atrial fib Eliquis per pharmacy dosing  DM2 with Hyperglycemia: A1c: 8 Sliding-scale insulin per protocol Increase Levemir 15 U daily Tube feed coverage 5 units every 4 hours  Thrombocytopenia, stable  Follow CBC  Tremors with  ? myoclonus when sedation lifted:  Home propranolol 20 mg EEG reassuring, no evidence epileptic activity   CODE STATUS: DNR , no reintubation has not been extubated per discussion with son [name] practice:  Diet: tube feeding and free water Pain/Anxiety/Delirium protocol (if indicated): Precedex/fentanyl, goal RASS 0 VAP protocol (if indicated): yes DVT prophylaxis: Eliquis GI prophylaxis: famotidine Glucose control: SSI q4h accuchecks, tf  coverage Mobility: bed rest Code Status: DNR Family Communication:  son [name] 2/1 Disposition:  ICU   The patient is critically ill with multiple organ systems failure and requires high complexity decision making for assessment and support, frequent evaluation and titration of therapies, application of advanced monitoring technologies and extensive interpretation of multiple databases. Critical Care Time devoted to patient care services described in this note independent of APP/resident  time is 35 minutes.      Kara Mead MD. [facility]. [facility]  If no response to pager , please call [phone]   12/20/2019

## 2019-12-20 DEATH — deceased

## 2019-12-21 ENCOUNTER — Inpatient Hospital Stay (HOSPITAL_COMMUNITY): Payer: Medicare Other

## 2019-12-21 LAB — BASIC METABOLIC PANEL
Anion gap: 6 (ref 5–15)
BUN: 74 mg/dL — ABNORMAL HIGH (ref 8–23)
CO2: 31 mmol/L (ref 22–32)
Calcium: 8 mg/dL — ABNORMAL LOW (ref 8.9–10.3)
Chloride: 101 mmol/L (ref 98–111)
Creatinine, Ser: 1.43 mg/dL — ABNORMAL HIGH (ref 0.44–1.00)
GFR calc Af Amer: 39 mL/min — ABNORMAL LOW (ref >60)
GFR calc non Af Amer: 34 mL/min — ABNORMAL LOW (ref >60)
Glucose, Bld: 246 mg/dL — ABNORMAL HIGH (ref 70–99)
Potassium: 5 mmol/L (ref 3.5–5.1)
Sodium: 138 mmol/L (ref 135–145)

## 2019-12-21 LAB — CBC
HCT: 42.7 % (ref 36.0–46.0)
Hemoglobin: 13.1 g/dL (ref 12.0–15.0)
MCH: 30.8 pg (ref 26.0–34.0)
MCHC: 30.7 g/dL (ref 30.0–36.0)
MCV: 100.2 fL — ABNORMAL HIGH (ref 80.0–100.0)
Platelets: 200 10*3/uL (ref 150–400)
RBC: 4.26 MIL/uL (ref 3.87–5.11)
RDW: 12.7 % (ref 11.5–15.5)
WBC: 10.7 10*3/uL — ABNORMAL HIGH (ref 4.0–10.5)
nRBC: 0 % (ref 0.0–0.2)

## 2019-12-21 LAB — GLUCOSE, CAPILLARY
Glucose-Capillary: 181 mg/dL — ABNORMAL HIGH (ref 70–99)
Glucose-Capillary: 238 mg/dL — ABNORMAL HIGH (ref 70–99)
Glucose-Capillary: 289 mg/dL — ABNORMAL HIGH (ref 70–99)
Glucose-Capillary: 250 mg/dL — ABNORMAL HIGH (ref 70–99)
Glucose-Capillary: 101 mg/dL — ABNORMAL HIGH (ref 70–99)

## 2019-12-21 LAB — PHOSPHORUS: Phosphorus: 4.5 mg/dL (ref 2.5–4.6)

## 2019-12-21 LAB — MAGNESIUM: Magnesium: 2.3 mg/dL (ref 1.7–2.4)

## 2019-12-21 MED ORDER — FENTANYL CITRATE (PF) 100 MCG/2ML IJ SOLN: 25.0000 ug | INTRAMUSCULAR | Status: DC | PRN

## 2019-12-21 MED ORDER — FENTANYL CITRATE (PF) 100 MCG/2ML IJ SOLN
25.0000 ug | INTRAMUSCULAR | Status: DC | PRN
  Administered 2019-12-21 – 2019-12-24 (×2): 100 ug via INTRAVENOUS
  Administered 2019-12-25 – 2019-12-27 (×2): 50 ug via INTRAVENOUS
  Filled 2019-12-21 (×4): qty 2

## 2019-12-21 NOTE — Progress Notes (Signed)
NAME:  [name], MRN:  [id number], DOB:  1935-05-24, LOS: 9 ADMISSION DATE:  11/26/2019, CONSULTATION DATE:   December 12, 2019 REFERRING MD: Dr. Owens Shark, CHIEF COMPLAINT:  Brought in by EMS confused, hypoxemic  Brief History   85 year old NHR recently diagnosed with Covid pneumonia brought to the [facility] emergency room combative hypoxemic on January 24 .  Required intubation, transferred to [facility] for further management.  Past Medical History  Squamous cell carcinoma of the nose COPD on 2 L oxygen Coronary artery disease  Eliquis for atrial fibrillation. Hyperlipidemia Hypertension GERD Osteoarthritis  Significant Hospital Events   1/24 intubation/admit 1/25:  Some agitation reported overnight. On precedex infusion. afib with rvr overnight but now rate controlled.  1/26:  Pt with tremulous/jerking-like motion when sedation weaned. Family states has baseline tremor for which she is on propanolol. This was restarted yesterday late morning and eeg ordered. Overnight still with episodes so was started on keppra and prn versed. eeg still pending. tmax 102.7 overnight. On 50%/10 on PRVC. All labs still pending this am 1/27: pt lost iv access yesterday so cvc placed. Also appears to have had more episodes yesterday that were seizure like but consistent with episode noted on eeg that is NOT a seizure. ? pseudosz activity although pt is on sedation so less likely. Also known to have tremors per pt's family although these episodes seem more pronounced. Diarrhea overnight as well as temp to 102.9 1/30 agitation 1/31 tolerating spontaneous breathing trial on pressure support 8/10  Consults:    Procedures:  1/24 ETT >  1/26 RIJ cvc->  Significant Diagnostic Tests:  1/8 cth: No acute intracranial abnormality.   Findings consistent with age related atrophy and chronic small vessel ischemia   Small unchanged lacunar infarct in the left basal ganglia. 1/9 MRI brain: 1. Severely  motion degraded examination. 2. No acute infarct identified. 3. Chronic left basal ganglia lacunar infarcts. 1/11 ECHO:  [id number] Grade 1 diastolic dysfunction  [id number] eeg: Severe diffuse encephalopathy, no EEG correlate with upward deviated eyes and whole body shaking, no seizure activity  Micro Data:  1/24 POC SARS CoV2 > positive 1/24 urine: ngtd 1/24 blood: ngtd 1/26 resp: Normal flora  Antimicrobials:  1/24 decadron >  1/24 remdesivir > 1/28 1/24 baricitinib->  Interim history/subjective:   No acute events.  Remains on the ventilator. On PSV weans   Objective   Blood pressure 125/81, pulse 64, temperature 98.3 F (36.8 C), temperature source Axillary, resp. rate (!) 28, height 5\' 4"  (1.626 m), weight 103.4 kg, SpO2 92 %. CVP:  [3 mmHg-11 mmHg] 5 mmHg  Vent Mode: PRVC FiO2 (%):  [50 %-55 %] 50 % Set Rate:  [28 bmp] 28 bmp Vt Set:  [430 mL] 430 mL PEEP:  [5 cmH20-10 cmH20] 10 cmH20 Pressure Support:  [10 cmH20] 10 cmH20 Plateau Pressure:  [18 cmH20-33 cmH20] 25 cmH20   Intake/Output Summary (Last 24 hours) at 12/21/2019 0740 Last data filed at 12/21/2019 0558 Gross per 24 hour  Intake 2830.33 ml  Output 3400 ml  Net -569.67 ml   Filed Weights   12/19/19 0500 12/20/19 0445 12/21/19 0432  Weight: 102.2 kg 103.7 kg 103.4 kg    Examination: Gen:      No acute distress HEENT:  EOMI, sclera anicteric, ETT Neck:     No masses; no thyromegaly Lungs:    Clear to auscultation bilaterally; normal respiratory effort CV:         Regular rate and  rhythm; no murmurs Abd:      + bowel sounds; soft, non-tender; no palpable masses, no distension Ext:    No edema; adequate peripheral perfusion Skin:      Warm and dry; no rash Neuro: Sedated, unresponsive  Resolved Hospital Problem list    Hypotension -in setting of propfol use  Assessment & Plan:  COVID pneumonia/ ARDS Continue pressure support weans as tolerated Intermittent chest x-ray Continue baricitinib,  steroids.  Finished remdesivir. Diuresis as tolerated.  H/o copd with chronic hypoxemic respiratory failure (2 to 3 L at baseline):  12/2017FEV1 0.92 (44%), last seen in office by Dr. Vaughan Browner 12/2018, not on steroids then Continue bronchodilators, Dulera, dexamethasone  Acute metabolic encephalopathy due to hypoxemia from COVID At risk delirium  Continue Precedex.  Minimize sedation Home BuSpar, Elavil, Neurontin Seroquel added 2/1.  AKI on  CKD3 - baseline cr 1.1 range Follow urine output, BMP Repeat Lasix 40 mg x 1  CAD , HTN HFpEF:  Follow volume status, diurese as she can tolerate Home amlodipine, isosorbide mono nitrate On home pravastatin  Persistent Atrial fib Continue Eliquis for anticoagulation  DM2 with Hyperglycemia: A1c: 8 SSI, NovoLog Levemir increased 2/1  Thrombocytopenia, stable  Follow CBC  Tremors with  ? myoclonus when sedation lifted:  Home propranolol 20 mg EEG reassuring, no evidence epileptic activity  CODE STATUS: DNR , no reintubation has not been extubated per discussion with son [name] practice:  Diet: tube feeding and free water Pain/Anxiety/Delirium protocol (if indicated): Precedex/fentanyl, goal RASS 0. Wean sedation VAP protocol (if indicated): yes DVT prophylaxis: Eliquis GI prophylaxis: famotidine Glucose control: SSI q4h accuchecks, tf coverage Mobility: bed rest Code Status: DNR Family Communication:  Son [name] 2/2 Disposition:  ICU  The patient is critically ill with multiple organ system failure and requires high complexity decision making for assessment and support, frequent evaluation and titration of therapies, advanced monitoring, review of radiographic studies and interpretation of complex data.   Critical Care Time devoted to patient care services, exclusive of separately billable procedures, described in this note is 35  minutes.   Marshell Garfinkel MD [facility] Please see [email or web address] for  pager details.  12/21/2019, 7:40 AM

## 2019-12-21 NOTE — Plan of Care (Signed)

## 2019-12-22 ENCOUNTER — Inpatient Hospital Stay (HOSPITAL_COMMUNITY): Payer: Medicare Other

## 2019-12-22 LAB — CBC
HCT: 40.5 % (ref 36.0–46.0)
Hemoglobin: 12.5 g/dL (ref 12.0–15.0)
MCH: 30.6 pg (ref 26.0–34.0)
MCHC: 30.9 g/dL (ref 30.0–36.0)
MCV: 99.3 fL (ref 80.0–100.0)
Platelets: 192 10*3/uL (ref 150–400)
RBC: 4.08 MIL/uL (ref 3.87–5.11)
RDW: 12.7 % (ref 11.5–15.5)
WBC: 9.5 10*3/uL (ref 4.0–10.5)
nRBC: 0 % (ref 0.0–0.2)

## 2019-12-22 LAB — BASIC METABOLIC PANEL
Anion gap: 5 (ref 5–15)
BUN: 71 mg/dL — ABNORMAL HIGH (ref 8–23)
CO2: 31 mmol/L (ref 22–32)
Calcium: 8 mg/dL — ABNORMAL LOW (ref 8.9–10.3)
Chloride: 105 mmol/L (ref 98–111)
Creatinine, Ser: 1.06 mg/dL — ABNORMAL HIGH (ref 0.44–1.00)
GFR calc Af Amer: 57 mL/min — ABNORMAL LOW (ref >60)
GFR calc non Af Amer: 49 mL/min — ABNORMAL LOW (ref >60)
Glucose, Bld: 129 mg/dL — ABNORMAL HIGH (ref 70–99)
Potassium: 4.6 mmol/L (ref 3.5–5.1)
Sodium: 141 mmol/L (ref 135–145)

## 2019-12-22 LAB — GLUCOSE, CAPILLARY
Glucose-Capillary: 111 mg/dL — ABNORMAL HIGH (ref 70–99)
Glucose-Capillary: 111 mg/dL — ABNORMAL HIGH (ref 70–99)
Glucose-Capillary: 124 mg/dL — ABNORMAL HIGH (ref 70–99)
Glucose-Capillary: 170 mg/dL — ABNORMAL HIGH (ref 70–99)
Glucose-Capillary: 197 mg/dL — ABNORMAL HIGH (ref 70–99)
Glucose-Capillary: 58 mg/dL — ABNORMAL LOW (ref 70–99)
Glucose-Capillary: 84 mg/dL (ref 70–99)
Glucose-Capillary: 97 mg/dL (ref 70–99)

## 2019-12-22 LAB — MAGNESIUM: Magnesium: 2.2 mg/dL (ref 1.7–2.4)

## 2019-12-22 LAB — D-DIMER, QUANTITATIVE: D-Dimer, Quant: 1.53 ug/mL-FEU — ABNORMAL HIGH (ref 0.00–0.50)

## 2019-12-22 LAB — PHOSPHORUS: Phosphorus: 3.5 mg/dL (ref 2.5–4.6)

## 2019-12-22 LAB — C-REACTIVE PROTEIN: CRP: 1.1 mg/dL — ABNORMAL HIGH (ref <1.0)

## 2019-12-22 MED ORDER — CLONAZEPAM 1 MG PO TABS
1.0000 mg | ORAL_TABLET | Freq: Two times a day (BID) | ORAL | Status: DC
  Administered 2019-12-22 – 2019-12-27 (×11): 1 mg
  Filled 2019-12-22 (×13): qty 1

## 2019-12-22 MED ORDER — INSULIN DETEMIR 100 UNIT/ML ~~LOC~~ SOLN
10.0000 [IU] | Freq: Two times a day (BID) | SUBCUTANEOUS | Status: DC
  Administered 2019-12-22: 10 [IU] via SUBCUTANEOUS
  Filled 2019-12-22 (×2): qty 0.1

## 2019-12-22 MED ORDER — FUROSEMIDE 10 MG/ML IJ SOLN
40.0000 mg | Freq: Two times a day (BID) | INTRAMUSCULAR | Status: DC
  Administered 2019-12-22 – 2019-12-24 (×5): 40 mg via INTRAVENOUS
  Filled 2019-12-22 (×5): qty 4

## 2019-12-22 MED ORDER — ORAL CARE MOUTH RINSE
15.0000 mL | OROMUCOSAL | Status: DC
  Administered 2019-12-22 – 2019-12-27 (×53): 15 mL via OROMUCOSAL

## 2019-12-22 MED ORDER — AMITRIPTYLINE HCL 10 MG PO TABS
10.0000 mg | ORAL_TABLET | Freq: Every day | ORAL | Status: DC
  Administered 2019-12-22 – 2019-12-23 (×2): 10 mg
  Filled 2019-12-22 (×2): qty 1

## 2019-12-22 MED ORDER — MELATONIN 3 MG PO TABS
6.0000 mg | ORAL_TABLET | Freq: Every day | ORAL | Status: DC
  Administered 2019-12-22 – 2019-12-26 (×5): 6 mg via ORAL
  Filled 2019-12-22 (×6): qty 2

## 2019-12-22 MED ORDER — CHLORHEXIDINE GLUCONATE 0.12% ORAL RINSE (MEDLINE KIT)
15.0000 mL | Freq: Two times a day (BID) | OROMUCOSAL | Status: DC
  Administered 2019-12-22 – 2019-12-27 (×11): 15 mL via OROMUCOSAL

## 2019-12-22 NOTE — Progress Notes (Addendum)
NAME:  [name], MRN:  [id number], DOB:  Aug 14, 1935, LOS: 10 ADMISSION DATE:  12/15/2019, CONSULTATION DATE:   December 12, 2019 REFERRING MD: Dr. Owens Shark, CHIEF COMPLAINT:  Brought in by EMS confused, hypoxemic  Brief History   85 year old NHR recently diagnosed with Covid pneumonia brought to the [facility] emergency room combative hypoxemic on January 24 .  Required intubation, transferred to [facility] for further management.  Past Medical History  Squamous cell carcinoma of the nose COPD on 2 L oxygen Coronary artery disease  Eliquis for atrial fibrillation. Hyperlipidemia Hypertension GERD Osteoarthritis  Significant Hospital Events   1/24 intubation/admit 1/25:  Some agitation reported overnight. On precedex infusion. afib with rvr overnight but now rate controlled.  1/26:  Pt with tremulous/jerking-like motion when sedation weaned. Family states has baseline tremor for which she is on propanolol. This was restarted yesterday late morning and eeg ordered. Overnight still with episodes so was started on keppra and prn versed. eeg still pending. tmax 102.7 overnight. On 50%/10 on PRVC. All labs still pending this am 1/27: pt lost iv access yesterday so cvc placed. Also appears to have had more episodes yesterday that were seizure like but consistent with episode noted on eeg that is NOT a seizure. ? pseudosz activity although pt is on sedation so less likely. Also known to have tremors per pt's family although these episodes seem more pronounced. Diarrhea overnight as well as temp to 102.9 1/30 agitation 1/31 tolerating spontaneous breathing trial on pressure support 8/10  Consults:    Procedures:  1/24 ETT >  1/26 RIJ cvc->  Significant Diagnostic Tests:  1/8 cth: No acute intracranial abnormality.   Findings consistent with age related atrophy and chronic small vessel ischemia   Small unchanged lacunar infarct in the left basal ganglia. 1/9 MRI brain: 1.  Severely motion degraded examination. 2. No acute infarct identified. 3. Chronic left basal ganglia lacunar infarcts. 1/11 ECHO:  [id number] Grade 1 diastolic dysfunction  [id number] eeg: Severe diffuse encephalopathy, no EEG correlate with upward deviated eyes and whole body shaking, no seizure activity  Micro Data:  1/24 POC SARS CoV2 > positive 1/24 urine: ngtd 1/24 blood: ngtd 1/26 resp: Normal flora  Antimicrobials:  1/24 decadron >  1/24 remdesivir > 1/28 1/24 baricitinib->  Interim history/subjective:   No acute events.  Remains on the ventilator. On PSV weans   Objective   Blood pressure (!) 139/110, pulse 84, temperature 98.2 F (36.8 C), temperature source Axillary, resp. rate (!) 26, height 5\' 4"  (1.626 m), weight 105.5 kg, SpO2 100 %. CVP:  [0 mmHg-12 mmHg] 5 mmHg  Vent Mode: PRVC FiO2 (%):  [50 %] 50 % Set Rate:  [28 bmp] 28 bmp Vt Set:  [430 mL] 430 mL PEEP:  [5 cmH20] 5 cmH20 Pressure Support:  [10 cmH20] 10 cmH20 Plateau Pressure:  [18 cmH20-26 cmH20] 24 cmH20   Intake/Output Summary (Last 24 hours) at 12/22/2019 0743 Last data filed at 12/22/2019 0600 Gross per 24 hour  Intake 3901.22 ml  Output 2065 ml  Net 1836.22 ml   Filed Weights   12/20/19 0445 12/21/19 0432 12/22/19 0500  Weight: 103.7 kg 103.4 kg 105.5 kg    Examination: Blood pressure (!) 139/110, pulse 84, temperature 98.2 F (36.8 C), temperature source Axillary, resp. rate (!) 26, height 5\' 4"  (1.626 m), weight 105.5 kg, SpO2 100 %. Gen:      No acute distress, elderly female HEENT:  EOMI, sclera anicteric Neck:  No masses; no thyromegaly ET tube Lungs:    Clear to auscultation bilaterally; normal respiratory effort CV:         Regular rate and rhythm; no murmurs Abd:      + bowel sounds; soft, non-tender; no palpable masses, no distension Ext:    No edema; adequate peripheral perfusion Skin:      Warm and dry; no rash Neuro: Sedated  2/3-Labs are stable.  Chest x-ray today reviewed  with bilateral opacities with mild improvement at the base.  Lines and tubes are in stable position  Resolved Hospital Problem list    Hypotension -in setting of propfol use  Assessment & Plan:  COVID pneumonia/ ARDS Continue pressure support weans as tolerated Intermittent chest x-ray Continue baricitinib, steroids.  Finished remdesivir. Start Lasix 40 every 12 hours  H/o copd with chronic hypoxemic respiratory failure (2 to 3 L at baseline):  12/2017FEV1 0.92 (44%), last seen in office by Dr. Vaughan Browner 12/2018, not on steroids then Continue bronchodilators, Dulera, dexamethasone  Acute metabolic encephalopathy due to hypoxemia from COVID At risk delirium  Continue Precedex.  Minimize sedation Home BuSpar, Elavil, Neurontin Restart elavil, melatonin at night. Start klonazepam Seroquel added 2/1.  AKI on  CKD3 - baseline cr 1.1 range Follow urine output, BMP Lasix as above  CAD , HTN HFpEF:  Follow volume status, diurese as she can tolerate Home amlodipine, isosorbide mono nitrate On home pravastatin  Persistent Atrial fib Continue Eliquis for anticoagulation  DM2 with Hyperglycemia: A1c: 8 SSI, NovoLog Levemir increased 2/1  Thrombocytopenia, stable  Follow CBC  Tremors with  ? myoclonus when sedation lifted:  Home propranolol 20 mg EEG reassuring, no evidence epileptic activity  CODE STATUS: DNR , no reintubation has not been extubated per discussion with son [name] practice:  Diet: tube feeding and free water Pain/Anxiety/Delirium protocol (if indicated): Precedex/fentanyl, goal RASS 0. Wean sedation VAP protocol (if indicated): yes DVT prophylaxis: Eliquis GI prophylaxis: famotidine Glucose control: SSI q4h accuchecks, tf coverage Mobility: bed rest Code Status: DNR Family Communication:  Son [name] 2/3 Disposition:  ICU  The patient is critically ill with multiple organ system failure and requires high complexity decision making for assessment and  support, frequent evaluation and titration of therapies, advanced monitoring, review of radiographic studies and interpretation of complex data.   Critical Care Time devoted to patient care services, exclusive of separately billable procedures, described in this note is 35  minutes.   Marshell Garfinkel MD [place] Please see [email or web address] for pager details.  12/22/2019, 7:43 AM

## 2019-12-23 LAB — BASIC METABOLIC PANEL
Anion gap: 9 (ref 5–15)
BUN: 75 mg/dL — ABNORMAL HIGH (ref 8–23)
CO2: 32 mmol/L (ref 22–32)
Calcium: 8.3 mg/dL — ABNORMAL LOW (ref 8.9–10.3)
Chloride: 102 mmol/L (ref 98–111)
Creatinine, Ser: 1.23 mg/dL — ABNORMAL HIGH (ref 0.44–1.00)
GFR calc Af Amer: 47 mL/min — ABNORMAL LOW (ref >60)
GFR calc non Af Amer: 41 mL/min — ABNORMAL LOW (ref >60)
Glucose, Bld: 35 mg/dL — CL (ref 70–99)
Potassium: 3.9 mmol/L (ref 3.5–5.1)
Sodium: 143 mmol/L (ref 135–145)

## 2019-12-23 LAB — CBC
HCT: 43.8 % (ref 36.0–46.0)
Hemoglobin: 13.5 g/dL (ref 12.0–15.0)
MCH: 30.2 pg (ref 26.0–34.0)
MCHC: 30.8 g/dL (ref 30.0–36.0)
MCV: 98 fL (ref 80.0–100.0)
Platelets: 243 10*3/uL (ref 150–400)
RBC: 4.47 MIL/uL (ref 3.87–5.11)
RDW: 12.8 % (ref 11.5–15.5)
WBC: 12.3 10*3/uL — ABNORMAL HIGH (ref 4.0–10.5)
nRBC: 0 % (ref 0.0–0.2)

## 2019-12-23 LAB — GLUCOSE, CAPILLARY
Glucose-Capillary: 61 mg/dL — ABNORMAL LOW (ref 70–99)
Glucose-Capillary: 101 mg/dL — ABNORMAL HIGH (ref 70–99)
Glucose-Capillary: 136 mg/dL — ABNORMAL HIGH (ref 70–99)
Glucose-Capillary: 137 mg/dL — ABNORMAL HIGH (ref 70–99)
Glucose-Capillary: 196 mg/dL — ABNORMAL HIGH (ref 70–99)
Glucose-Capillary: 106 mg/dL — ABNORMAL HIGH (ref 70–99)
Glucose-Capillary: 52 mg/dL — ABNORMAL LOW (ref 70–99)

## 2019-12-23 LAB — MAGNESIUM: Magnesium: 2.2 mg/dL (ref 1.7–2.4)

## 2019-12-23 LAB — PHOSPHORUS: Phosphorus: 4 mg/dL (ref 2.5–4.6)

## 2019-12-23 MED ORDER — DEXTROSE 50 % IV SOLN: 12.5000 g | INTRAVENOUS | Status: AC

## 2019-12-23 MED ORDER — AMITRIPTYLINE HCL 10 MG PO TABS
10.0000 mg | ORAL_TABLET | Freq: Every day | ORAL | Status: DC
  Administered 2019-12-24 – 2019-12-26 (×3): 10 mg
  Filled 2019-12-23 (×4): qty 1

## 2019-12-23 MED ORDER — DEXTROSE 50 % IV SOLN
INTRAVENOUS | Status: AC
  Administered 2019-12-23: 12.5 g via INTRAVENOUS
  Filled 2019-12-23: qty 50

## 2019-12-23 MED ORDER — DEXTROSE 50 % IV SOLN
25.0000 g | INTRAVENOUS | Status: AC
  Administered 2019-12-23: 25 g via INTRAVENOUS
  Filled 2019-12-23: qty 50

## 2019-12-23 NOTE — Progress Notes (Signed)
NAME:  [name], MRN:  [id number], DOB:  [birth year], LOS: 42 ADMISSION DATE:  11/27/2019, CONSULTATION DATE:   December 12, 2019 REFERRING MD: Dr. Owens Shark, CHIEF COMPLAINT:  Brought in by EMS confused, hypoxemic  Brief History   85 year old NHR recently diagnosed with Covid pneumonia brought to the [facility] emergency room combative hypoxemic on January 24 .  Required intubation, transferred to [facility] for further management.  Past Medical History  Squamous cell carcinoma of the nose COPD on 2 L oxygen Coronary artery disease  Eliquis for atrial fibrillation. Hyperlipidemia Hypertension GERD Osteoarthritis  Significant Hospital Events   1/24 intubation/admit 1/25:  Some agitation reported overnight. On precedex infusion. afib with rvr overnight but now rate controlled.  1/26:  Pt with tremulous/jerking-like motion when sedation weaned. Family states has baseline tremor for which she is on propanolol. This was restarted yesterday late morning and eeg ordered. Overnight still with episodes so was started on keppra and prn versed. eeg still pending. tmax 102.7 overnight. On 50%/10 on PRVC. All labs still pending this am 1/27: pt lost iv access yesterday so cvc placed. Also appears to have had more episodes yesterday that were seizure like but consistent with episode noted on eeg that is NOT a seizure. ? pseudosz activity although pt is on sedation so less likely. Also known to have tremors per pt's family although these episodes seem more pronounced. Diarrhea overnight as well as temp to 102.9 1/30 agitation 1/31 tolerating spontaneous breathing trial on pressure support 8/10  Consults:    Procedures:  1/24 ETT >  1/26 RIJ cvc->  Significant Diagnostic Tests:  1/8 cth: No acute intracranial abnormality.   Findings consistent with age related atrophy and chronic small vessel ischemia   Small unchanged lacunar infarct in the left basal ganglia. 1/9 MRI brain: 1.  Severely motion degraded examination. 2. No acute infarct identified. 3. Chronic left basal ganglia lacunar infarcts. 1/11 ECHO:  LVEF 0000000 Grade 1 diastolic dysfunction  99991111 eeg: Severe diffuse encephalopathy, no EEG correlate with upward deviated eyes and whole body shaking, no seizure activity  Micro Data:  1/24 POC SARS CoV2 > positive 1/24 urine: ngtd 1/24 blood: ngtd 1/26 resp: Normal flora  Antimicrobials:  1/24 decadron >  1/24 remdesivir > 1/28 1/24 baricitinib->  Interim history/subjective:   Remains on the ventilator, on PSV weans  Objective   Blood pressure 121/78, pulse 89, temperature 98.3 F (36.8 C), temperature source Axillary, resp. rate (!) 28, height 5\' 4"  (1.626 m), weight 105.1 kg, SpO2 (!) 88 %. CVP:  [0 mmHg-36 mmHg] 18 mmHg  Vent Mode: PRVC FiO2 (%):  [45 %-60 %] 60 % Set Rate:  [28 bmp] 28 bmp Vt Set:  [430 mL] 430 mL PEEP:  [5 cmH20] 5 cmH20 Pressure Support:  [5 cmH20-10 cmH20] 5 cmH20 Plateau Pressure:  [22 cmH20-26 cmH20] 24 cmH20   Intake/Output Summary (Last 24 hours) at 12/23/2019 0801 Last data filed at 12/23/2019 0700 Gross per 24 hour  Intake 2794.17 ml  Output 2950 ml  Net -155.83 ml   Filed Weights   12/21/19 0432 12/22/19 0500 12/23/19 0500  Weight: 103.4 kg 105.5 kg 105.1 kg    Examination: Gen:      No acute distress HEENT:  EOMI, sclera anicteric Neck:     No masses; no thyromegaly, ETT Lungs:    Clear to auscultation bilaterally; normal respiratory effort CV:         Regular rate and rhythm; no murmurs Abd:      +  bowel sounds; soft, non-tender; no palpable masses, no distension Ext:    No edema; adequate peripheral perfusion Skin:      Warm and dry; no rash Neuro: Sedated  Labs significant for BUN/creatinine 25/1.23, WBC 12.3, platelets 243  Resolved Hospital Problem list    Hypotension -in setting of propfol use  Assessment & Plan:  COVID pneumonia/ ARDS Continue pressure support weans as  tolerated Intermittent chest x-ray Continue baricitinib, steroids.  Finished remdesivir. Continue Lasix  H/o copd with chronic hypoxemic respiratory failure (2 to 3 L at baseline):  12/2017FEV1 0.92 (44%), last seen in office by Dr. Vaughan Browner 12/2018, not on steroids then Continue bronchodilators, Dulera Finished 10 days of steroids  Acute metabolic encephalopathy due to hypoxemia from COVID At risk delirium  Continue Precedex.  Minimize sedation Home BuSpar, Elavil, Neurontin Restart elavil, melatonin at night. Start klonazepam Seroquel added 2/1.  AKI on  CKD3 - baseline cr 1.1 range Follow urine output, BMP Lasix as above  CAD , HTN HFpEF:  Follow volume status, diurese as she can tolerate Home amlodipine, isosorbide mono nitrate On home pravastatin  Persistent Atrial fib Continue Eliquis for anticoagulation  DM2 A1c: 8 SSI, NovoLog Hold levemir due to hyperglycemia  Thrombocytopenia, stable  Follow CBC  Tremors with  ? myoclonus when sedation lifted:  Home propranolol 20 mg EEG reassuring, no evidence epileptic activity  CODE STATUS: DNR , no reintubation has not been extubated per discussion with son [name] practice:  Diet: tube feeding and free water Pain/Anxiety/Delirium protocol (if indicated): Precedex/fentanyl, goal RASS 0. Wean sedation VAP protocol (if indicated): yes DVT prophylaxis: Eliquis GI prophylaxis: famotidine Glucose control: SSI q4h accuchecks, tf coverage Mobility: bed rest Code Status: DNR Family Communication:  Son [name] 2/3 Disposition:  ICU  The patient is critically ill with multiple organ system failure and requires high complexity decision making for assessment and support, frequent evaluation and titration of therapies, advanced monitoring, review of radiographic studies and interpretation of complex data.   Critical Care Time devoted to patient care services, exclusive of separately billable procedures, described in this note  is 35 minutes.   Marshell Garfinkel MD [facility] Please see [email or web address] for pager details.  12/23/2019, 8:04 AM

## 2019-12-23 NOTE — Progress Notes (Signed)
Ventilator pt transported from 201 to 207 without any complications.

## 2019-12-24 ENCOUNTER — Inpatient Hospital Stay: Payer: Self-pay

## 2019-12-24 ENCOUNTER — Inpatient Hospital Stay (HOSPITAL_COMMUNITY): Payer: Medicare Other

## 2019-12-24 LAB — MRSA PCR SCREENING: MRSA by PCR: NEGATIVE

## 2019-12-24 LAB — PROCALCITONIN: Procalcitonin: 0.24 ng/mL

## 2019-12-24 LAB — BASIC METABOLIC PANEL
Anion gap: 12 (ref 5–15)
BUN: 94 mg/dL — ABNORMAL HIGH (ref 8–23)
CO2: 30 mmol/L (ref 22–32)
Calcium: 8.2 mg/dL — ABNORMAL LOW (ref 8.9–10.3)
Chloride: 100 mmol/L (ref 98–111)
Creatinine, Ser: 1.74 mg/dL — ABNORMAL HIGH (ref 0.44–1.00)
GFR calc Af Amer: 31 mL/min — ABNORMAL LOW (ref >60)
GFR calc non Af Amer: 27 mL/min — ABNORMAL LOW (ref >60)
Glucose, Bld: 208 mg/dL — ABNORMAL HIGH (ref 70–99)
Potassium: 4.2 mmol/L (ref 3.5–5.1)
Sodium: 142 mmol/L (ref 135–145)

## 2019-12-24 LAB — CBC
HCT: 43.7 % (ref 36.0–46.0)
Hemoglobin: 13.4 g/dL (ref 12.0–15.0)
MCH: 30.7 pg (ref 26.0–34.0)
MCHC: 30.7 g/dL (ref 30.0–36.0)
MCV: 100 fL (ref 80.0–100.0)
Platelets: 278 10*3/uL (ref 150–400)
RBC: 4.37 MIL/uL (ref 3.87–5.11)
RDW: 13.1 % (ref 11.5–15.5)
WBC: 13.8 10*3/uL — ABNORMAL HIGH (ref 4.0–10.5)
nRBC: 0 % (ref 0.0–0.2)

## 2019-12-24 LAB — GLUCOSE, CAPILLARY
Glucose-Capillary: 105 mg/dL — ABNORMAL HIGH (ref 70–99)
Glucose-Capillary: 107 mg/dL — ABNORMAL HIGH (ref 70–99)
Glucose-Capillary: 171 mg/dL — ABNORMAL HIGH (ref 70–99)
Glucose-Capillary: 177 mg/dL — ABNORMAL HIGH (ref 70–99)
Glucose-Capillary: 180 mg/dL — ABNORMAL HIGH (ref 70–99)
Glucose-Capillary: 196 mg/dL — ABNORMAL HIGH (ref 70–99)
Glucose-Capillary: 64 mg/dL — ABNORMAL LOW (ref 70–99)
Glucose-Capillary: 73 mg/dL (ref 70–99)

## 2019-12-24 LAB — PHOSPHORUS: Phosphorus: 5.3 mg/dL — ABNORMAL HIGH (ref 2.5–4.6)

## 2019-12-24 LAB — MAGNESIUM: Magnesium: 2.3 mg/dL (ref 1.7–2.4)

## 2019-12-24 MED ORDER — VANCOMYCIN HCL 1500 MG/300ML IV SOLN
1500.0000 mg | INTRAVENOUS | Status: DC
  Filled 2019-12-24: qty 300

## 2019-12-24 MED ORDER — CHLORHEXIDINE GLUCONATE CLOTH 2 % EX PADS: 6.0000 | MEDICATED_PAD | Freq: Every day | CUTANEOUS | Status: DC

## 2019-12-24 MED ORDER — SODIUM CHLORIDE 0.9 % IV SOLN
2.0000 g | INTRAVENOUS | Status: DC
  Administered 2019-12-24 – 2019-12-26 (×3): 2 g via INTRAVENOUS
  Filled 2019-12-24 (×3): qty 2

## 2019-12-24 MED ORDER — VANCOMYCIN HCL 2000 MG/400ML IV SOLN
2000.0000 mg | Freq: Once | INTRAVENOUS | Status: AC
  Administered 2019-12-24: 2000 mg via INTRAVENOUS
  Filled 2019-12-24: qty 400

## 2019-12-24 MED ORDER — SODIUM CHLORIDE 0.9% FLUSH: 10.0000 mL | INTRAVENOUS | Status: DC | PRN

## 2019-12-24 MED ORDER — SODIUM CHLORIDE 0.9% FLUSH
10.0000 mL | Freq: Two times a day (BID) | INTRAVENOUS | Status: DC
  Administered 2019-12-24 – 2019-12-27 (×6): 10 mL

## 2019-12-24 MED ORDER — DEXTROSE 50 % IV SOLN
INTRAVENOUS | Status: AC
  Administered 2019-12-24: 12.5 g via INTRAVENOUS
  Filled 2019-12-24: qty 50

## 2019-12-24 MED ORDER — DEXTROSE 50 % IV SOLN: 12.5000 g | INTRAVENOUS | Status: AC

## 2019-12-24 MED FILL — Sodium Chloride IV Soln 0.9%: INTRAVENOUS | Qty: 250 | Status: AC

## 2019-12-24 MED FILL — Fentanyl Citrate Preservative Free (PF) Inj 2500 MCG/50ML: INTRAMUSCULAR | Qty: 50 | Status: AC

## 2019-12-24 MED FILL — Fentanyl Citrate Preservative Free (PF) Inj 2500 MCG/50ML: INTRAMUSCULAR | Qty: 50 | Status: CN

## 2019-12-24 MED FILL — Sodium Chloride IV Soln 0.9%: INTRAVENOUS | Qty: 250 | Status: CN

## 2019-12-24 NOTE — Progress Notes (Signed)
Peripherally Inserted Central Catheter/Midline Placement  The IV Nurse has discussed with the patient and/or persons authorized to consent for the patient, the purpose of this procedure and the potential benefits and risks involved with this procedure.  The benefits include less needle sticks, lab draws from the catheter, and the patient may be discharged home with the catheter. Risks include, but not limited to, infection, bleeding, blood clot (thrombus formation), and puncture of an artery; nerve damage and irregular heartbeat and possibility to perform a PICC exchange if needed/ordered by physician.  Alternatives to this procedure were also discussed.  Bard Power PICC patient education guide, fact sheet on infection prevention and patient information card has been provided to patient /or left at bedside.    PICC/Midline Placement Documentation  PICC Double Lumen [id number] PICC Right Basilic 40 cm 0 cm (Active)  Indication for Insertion or Continuance of Line Prolonged intravenous therapies 12/24/19 1802  Exposed Catheter (cm) 0 cm 12/24/19 1802  Site Assessment Clean;Dry;Intact 12/24/19 1802  Lumen #1 Status Flushed;Blood return noted;Saline locked 12/24/19 1802  Lumen #2 Status Flushed;Blood return noted;Saline locked 12/24/19 1802  Dressing Type Transparent 12/24/19 1802  Dressing Status Clean;Intact;Dry;Antimicrobial disc in place 12/24/19 1802  Dressing Change Due 12/31/19 12/24/19 1802       Scotty Court 12/24/2019, 6:05 PM

## 2019-12-24 NOTE — Progress Notes (Signed)
This RT called to bedside due to the patient's O2 sats were in mid 70's.  RT lavaged patient to assure there was no mucous plugs, with no return of plugs upon suctioning and peak pressure on the ventilator within same range as they have been trending.  RT attempted a recruitment maneuver with patient O2 saturations getting worse over the course of 2 minutes.  This RT also attempted to gradually give the patient more PEEP and patient O2 saturations only began to lower with this change as well. RT contacted E-link and advised of events and MD will get back with next measures.

## 2019-12-24 NOTE — Progress Notes (Addendum)
NAME:  [name], MRN:  [id number], DOB:  [birth year], LOS: 12 ADMISSION DATE:  12/18/2019, CONSULTATION DATE:   December 12, 2019 REFERRING MD: Dr. Owens [name], CHIEF COMPLAINT:  Brought in by EMS confused, hypoxemic  Brief History   85 year old NHR recently diagnosed with Covid pneumonia brought to the [facility] emergency room combative hypoxemic on January 24 .  Required intubation, transferred to [facility] for further management.  Past Medical History  Squamous cell carcinoma of the nose COPD on 2 L oxygen Coronary artery disease  Eliquis for atrial fibrillation. Hyperlipidemia Hypertension GERD Osteoarthritis  Significant Hospital Events   1/24 intubation/admit 1/25:  Some agitation reported overnight. On precedex infusion. afib with rvr overnight but now rate controlled.  1/26:  Pt with tremulous/jerking-like motion when sedation weaned. Family states has baseline tremor for which she is on propanolol. This was restarted yesterday late morning and eeg ordered. Overnight still with episodes so was started on keppra and prn versed. eeg still pending. tmax 102.7 overnight. On 50%/10 on PRVC. All labs still pending this am 1/27: pt lost iv access yesterday so cvc placed. Also appears to have had more episodes yesterday that were seizure like but consistent with episode noted on eeg that is NOT a seizure. ? pseudosz activity although pt is on sedation so less likely. Also known to have tremors per pt's family although these episodes seem more pronounced. Diarrhea overnight as well as temp to 102.9 1/30 agitation 1/31 tolerating spontaneous breathing trial on pressure support 8/10 2/4 increasing O2, PEEP requirement  Consults:    Procedures:  1/24 ETT >  1/26 RIJ cvc->  Significant Diagnostic Tests:  1/8 cth: No acute intracranial abnormality.   Findings consistent with age related atrophy and chronic small vessel ischemia   Small unchanged lacunar infarct in the left  basal ganglia. 1/9 MRI brain: 1. Severely motion degraded examination. 2. No acute infarct identified. 3. Chronic left basal ganglia lacunar infarcts. 1/11 ECHO:  LVEF 0000000 Grade 1 diastolic dysfunction  [id number] eeg: Severe diffuse encephalopathy, no EEG correlate with upward deviated eyes and whole body shaking, no seizure activity  Micro Data:  1/24 POC SARS CoV2 > positive 1/24 urine: ngtd 1/24 blood: ngtd 1/26 resp: Normal flora  Antimicrobials:  1/24 decadron >  1/24 remdesivir > 1/28 1/24 baricitinib->  Interim history/subjective:   Remains on the ventilator.  PEEP up to 10, 70% FiO2  Objective   Blood pressure 110/72, pulse 89, temperature 98.8 F (37.1 C), resp. rate (!) 30, height 5\' 4"  (1.626 m), weight 101.5 kg, SpO2 (!) 88 %.    Vent Mode: PRVC FiO2 (%):  [50 %-70 %] 70 % Set Rate:  [28 bmp] 28 bmp Vt Set:  [430 mL] 430 mL PEEP:  [5 cmH20-10 cmH20] 10 cmH20 Pressure Support:  [10 cmH20] 10 cmH20 Plateau Pressure:  [17 cmH20-34 cmH20] 34 cmH20   Intake/Output Summary (Last 24 hours) at 12/24/2019 0807 Last data filed at 12/24/2019 0600 Gross per 24 hour  Intake 1303.99 ml  Output 1250 ml  Net 53.99 ml   Filed Weights   12/22/19 0500 12/23/19 0500 12/24/19 0500  Weight: 105.5 kg 105.1 kg 101.5 kg    Examination: Gen:      No acute distress HEENT:  EOMI, sclera anicteric Neck:     No masses; no thyromegaly, ET tube Lungs:    Clear to auscultation bilaterally; normal respiratory effort CV:         Regular rate and rhythm; no  murmurs Abd:      + bowel sounds; soft, non-tender; no palpable masses, no distension Ext:    No edema; adequate peripheral perfusion Skin:      Warm and dry; no rash Neuro: Sedated, intermittently agitated  Labs significant for BUN/creatinine 94/1.7, WBC 13.8  Resolved Hospital Problem list    Hypotension -in setting of propfol use  Assessment & Plan:  COVID pneumonia/ ARDS Increasing PEEP, FiO2 requirements Follow chest  x-ray Check procalcitonin and sputum cultures.  May need antibiotics for possible HAP DC CVL after getting peripheral access Continue baricitinib, steroids.  Finished remdesivir. Hold Lasix as creatinine is higher today  H/o copd with chronic hypoxemic respiratory failure (2 to 3 L at baseline):  12/2017FEV1 0.92 (44%), last seen in office by Dr. Vaughan [name] 12/2018, not on steroids then Continue bronchodilators, Dulera Finished 10 days of steroids  Acute metabolic encephalopathy due to hypoxemia from COVID At risk delirium  Continue Precedex.  Minimize sedation Home BuSpar, Elavil, Neurontin Restart elavil, melatonin at night. Start klonazepam Seroquel added 2/1.  AKI on  CKD3 - baseline cr 1.1 range Follow urine output, BMP  CAD , HTN HFpEF:  Follow volume status, diurese as she can tolerate Home amlodipine, isosorbide mono nitrate On home pravastatin  Persistent Atrial fib Continue Eliquis for anticoagulation  DM2 A1c: 8 SSI, NovoLog Hold levemir due to hyperglycemia  Thrombocytopenia, stable  Follow CBC  Tremors with  ? myoclonus when sedation lifted:  Home propranolol 20 mg EEG reassuring, no evidence epileptic activity  CODE STATUS: DNR , no reintubation has not been extubated per discussion with son [name] practice:  Diet: tube feeding and free water Pain/Anxiety/Delirium protocol (if indicated): Precedex/fentanyl, goal RASS 0. Wean sedation VAP protocol (if indicated): yes DVT prophylaxis: Eliquis GI prophylaxis: famotidine Glucose control: SSI q4h accuchecks, tf coverage Mobility: bed rest Code Status: DNR Family Communication:  Son [name] 2/5 Disposition:  ICU  The patient is critically ill with multiple organ system failure and requires high complexity decision making for assessment and support, frequent evaluation and titration of therapies, advanced monitoring, review of radiographic studies and interpretation of complex data.   Critical Care Time  devoted to patient care services, exclusive of separately billable procedures, described in this note is 35 minutes.   [name] Garfinkel MD [facility] Please see [email or web address] for pager details.  12/24/2019, 8:07 AM

## 2019-12-24 NOTE — Progress Notes (Signed)
Pharmacy Antibiotic Note  [name] is a 85 y.o. female admitted on 12/18/2019 with COVID-19 now with concern for new pneumonia.  Pharmacy has been consulted for Cefepime and vancomycin dosing. WBC up to 13.8. Fevered yesterday morning but afebrile today. SCr up to 1.74. RN reports increased secretions   Plan: -Cefepime 2 gm IV Q 24 hours -Vancomycin 2 gm IV once, then Vancomycin 1500 mg IV Q 48 hrs. Goal AUC 400-550. Expected AUC: 524 SCr used: 1.74 -Recultured today and MRSA PCR - f/u results -Monitor renal fx and clinical progress   Height: 5\' 4"  (162.6 cm) Weight: 223 lb 12.3 oz (101.5 kg) IBW/kg (Calculated) : 54.7  Temp (24hrs), Avg:99.2 F (37.3 C), Min:98.3 F (36.8 C), Max:100.4 F (38 C)  Recent Labs  Lab 12/20/19 0345 12/21/19 0505 12/22/19 0319 12/23/19 0420 12/24/19 0450  WBC 13.4* 10.7* 9.5 12.3* 13.8*  CREATININE 1.26* 1.43* 1.06* 1.23* 1.74*    Estimated Creatinine Clearance: 28.9 mL/min (A) (by C-G formula based on SCr of 1.74 mg/dL (H)).    Allergies  Allergen Reactions  . 5-Alpha Reductase Inhibitors   . Iohexol Other (See Comments)    Code:  HIVES, Desc:  PER ROBIN @ GI, PT IS ALLERGIC TO IVP DYE 08/28/10 RM  . Lipitor [Atorvastatin Calcium] Hives    Antimicrobials this admission: Cefepime 2/5 >>  Vancomcyin 2/5 >>   Dose adjustments this admission:  Microbiology results: 2/5 BCx:  2/5 TA: 2/5 MRSA PCR:   Thank you for allowing pharmacy to be a part of this patient's care.  Albertina Parr, PharmD., BCPS Clinical Pharmacist Clinical phone for 12/24/19 until 5pm: [phone]

## 2019-12-25 ENCOUNTER — Inpatient Hospital Stay (HOSPITAL_COMMUNITY): Payer: Medicare Other

## 2019-12-25 DIAGNOSIS — J9600 Acute respiratory failure, unspecified whether with hypoxia or hypercapnia: Secondary | ICD-10-CM

## 2019-12-25 LAB — GLUCOSE, CAPILLARY
Glucose-Capillary: 215 mg/dL — ABNORMAL HIGH (ref 70–99)
Glucose-Capillary: 123 mg/dL — ABNORMAL HIGH (ref 70–99)
Glucose-Capillary: 187 mg/dL — ABNORMAL HIGH (ref 70–99)
Glucose-Capillary: 205 mg/dL — ABNORMAL HIGH (ref 70–99)

## 2019-12-25 LAB — POCT I-STAT 7, (LYTES, BLD GAS, ICA,H+H)
Acid-Base Excess: 3 mmol/L — ABNORMAL HIGH (ref 0.0–2.0)
Bicarbonate: 29.6 mmol/L — ABNORMAL HIGH (ref 20.0–28.0)
Calcium, Ion: 1.15 mmol/L (ref 1.15–1.40)
HCT: 36 % (ref 36.0–46.0)
Hemoglobin: 12.2 g/dL (ref 12.0–15.0)
O2 Saturation: 86 %
Patient temperature: 98.6
Potassium: 4 mmol/L (ref 3.5–5.1)
Sodium: 143 mmol/L (ref 135–145)
TCO2: 31 mmol/L (ref 22–32)
pCO2 arterial: 51.4 mmHg — ABNORMAL HIGH (ref 32.0–48.0)
pH, Arterial: 7.369 (ref 7.350–7.450)
pO2, Arterial: 54 mmHg — ABNORMAL LOW (ref 83.0–108.0)

## 2019-12-25 LAB — BASIC METABOLIC PANEL
Anion gap: 16 — ABNORMAL HIGH (ref 5–15)
BUN: 108 mg/dL — ABNORMAL HIGH (ref 8–23)
CO2: 27 mmol/L (ref 22–32)
Calcium: 7.5 mg/dL — ABNORMAL LOW (ref 8.9–10.3)
Chloride: 101 mmol/L (ref 98–111)
Creatinine, Ser: 1.79 mg/dL — ABNORMAL HIGH (ref 0.44–1.00)
GFR calc Af Amer: 30 mL/min — ABNORMAL LOW (ref >60)
GFR calc non Af Amer: 26 mL/min — ABNORMAL LOW (ref >60)
Glucose, Bld: 191 mg/dL — ABNORMAL HIGH (ref 70–99)
Potassium: 4 mmol/L (ref 3.5–5.1)
Sodium: 144 mmol/L (ref 135–145)

## 2019-12-25 LAB — CBC
HCT: 39.2 % (ref 36.0–46.0)
Hemoglobin: 11.5 g/dL — ABNORMAL LOW (ref 12.0–15.0)
MCH: 30.3 pg (ref 26.0–34.0)
MCHC: 29.3 g/dL — ABNORMAL LOW (ref 30.0–36.0)
MCV: 103.4 fL — ABNORMAL HIGH (ref 80.0–100.0)
Platelets: 223 10*3/uL (ref 150–400)
RBC: 3.79 MIL/uL — ABNORMAL LOW (ref 3.87–5.11)
RDW: 13.2 % (ref 11.5–15.5)
WBC: 8 10*3/uL (ref 4.0–10.5)
nRBC: 0 % (ref 0.0–0.2)

## 2019-12-25 LAB — PROCALCITONIN: Procalcitonin: 1.15 ng/mL

## 2019-12-25 LAB — PHOSPHORUS: Phosphorus: 5.3 mg/dL — ABNORMAL HIGH (ref 2.5–4.6)

## 2019-12-25 LAB — MAGNESIUM: Magnesium: 2.5 mg/dL — ABNORMAL HIGH (ref 1.7–2.4)

## 2019-12-25 MED ORDER — VECURONIUM BROMIDE 10 MG IV SOLR: 10.0000 mg | Freq: Once | INTRAVENOUS | Status: DC

## 2019-12-25 MED ORDER — APIXABAN 2.5 MG PO TABS
2.5000 mg | ORAL_TABLET | Freq: Two times a day (BID) | ORAL | Status: DC
  Administered 2019-12-25 – 2019-12-27 (×4): 2.5 mg
  Filled 2019-12-25 (×6): qty 1

## 2019-12-25 MED ORDER — NOREPINEPHRINE 4 MG/250ML-% IV SOLN
0.0000 ug/min | INTRAVENOUS | Status: DC
  Administered 2019-12-25: 2 ug/min via INTRAVENOUS
  Filled 2019-12-25: qty 250

## 2019-12-25 NOTE — Progress Notes (Signed)
Patient's sats have started to drop to the upper 70's.  RT called to check pt.

## 2019-12-25 NOTE — Progress Notes (Signed)
eLink Physician-Brief Progress Note Patient Name: [name] DOB: [birth year] MRN: [id number]   Date of Service  12/25/2019  HPI/Events of Note  Notified of hypotension and desaturation. Currently not on pressors BP 72/45 O2 sat 84%  eICU Interventions   Ordered ABG and CXR  Start norepinephrine  eLink to be informed once tests are done     Intervention Category Major Interventions: Hypotension - evaluation and management;Respiratory failure - evaluation and management  Judd Lien 12/25/2019, 12:18 AM

## 2019-12-25 NOTE — Progress Notes (Signed)
NAME:  [name], MRN:  [id number], DOB:  1935-04-24, LOS: 20 ADMISSION DATE:  12/19/2019, CONSULTATION DATE:   December 12, 2019 REFERRING MD: Dr. Owens Shark, CHIEF COMPLAINT:  Brought in by EMS confused, hypoxemic  Brief History   85 year old NHR recently diagnosed with Covid pneumonia brought to the [facility] emergency room combative hypoxemic on January 24 .  Required intubation, transferred to [facility] for further management.  Past Medical History  Squamous cell carcinoma of the nose COPD on 2 L oxygen Coronary artery disease  Eliquis for atrial fibrillation. Hyperlipidemia Hypertension GERD Osteoarthritis  Significant Hospital Events   1/24 intubation/admit 1/25:  Some agitation reported overnight. On precedex infusion. afib with rvr overnight but now rate controlled.  1/26:  Pt with tremulous/jerking-like motion when sedation weaned. Family states has baseline tremor for which she is on propanolol. This was restarted yesterday late morning and eeg ordered. Overnight still with episodes so was started on keppra and prn versed. eeg still pending. tmax 102.7 overnight. On 50%/10 on PRVC. All labs still pending this am 1/27: pt lost iv access yesterday so cvc placed. Also appears to have had more episodes yesterday that were seizure like but consistent with episode noted on eeg that is NOT a seizure. ? pseudosz activity although pt is on sedation so less likely. Also known to have tremors per pt's family although these episodes seem more pronounced. Diarrhea overnight as well as temp to 102.9 1/30 agitation 1/31 tolerating spontaneous breathing trial on pressure support 8/10 2/4 increasing O2, PEEP requirement 2/5 started antibiotics for HAP  Consults:    Procedures:  1/24 ETT >  1/26 RIJ CVL->  Significant Diagnostic Tests:  1/8 cth: No acute intracranial abnormality.   Findings consistent with age related atrophy and chronic small vessel ischemia   Small unchanged  lacunar infarct in the left basal ganglia. 1/9 MRI brain: 1. Severely motion degraded examination. 2. No acute infarct identified. 3. Chronic left basal ganglia lacunar infarcts. 1/11 ECHO:  LVEF 0000000 Grade 1 diastolic dysfunction  99991111 eeg: Severe diffuse encephalopathy, no EEG correlate with upward deviated eyes and whole body shaking, no seizure activity  Micro Data:  1/24 POC SARS CoV2 > positive 1/24 urine: ngtd 1/24 blood: ngtd 1/26 resp: Normal flora   2/5 respiratory-GPC, GNR 2/5 blood-  Antimicrobials:  1/24 decadron >  2/4 1/24 remdesivir > 1/28 1/24 baricitinib->  Cefepime 2/6 >> Vanco 2/6 >>  Interim history/subjective:   Started Levophed, FiO2 up to 100%  Objective   Blood pressure (!) 87/61, pulse 83, temperature 98.9 F (37.2 C), temperature source Axillary, resp. rate (!) 26, height 5\' 4"  (1.626 m), weight 102.4 kg, SpO2 (!) 79 %.    Vent Mode: PRVC FiO2 (%):  [100 %] 100 % Set Rate:  [28 bmp] 28 bmp Vt Set:  [430 mL] 430 mL PEEP:  [12 cmH20] 12 cmH20 Plateau Pressure:  [24 cmH20-26 cmH20] 24 cmH20   Intake/Output Summary (Last 24 hours) at 12/25/2019 0817 Last data filed at 12/25/2019 0700 Gross per 24 hour  Intake 2458.91 ml  Output 2650 ml  Net -191.09 ml   Filed Weights   12/23/19 0500 12/24/19 0500 12/25/19 0500  Weight: 105.1 kg 101.5 kg 102.4 kg    Examination: Gen:      No acute distress HEENT:  EOMI, sclera anicteric, ETT Neck:     No masses; no thyromegaly Lungs:    Clear to auscultation bilaterally; normal respiratory effort CV:  Regular rate and rhythm; no murmurs Abd:      + bowel sounds; soft, non-tender; no palpable masses, no distension Ext:    No edema; adequate peripheral perfusion Skin:      Warm and dry; no rash Neuro: [facility] Problem list    Hypotension -in setting of propfol use  Assessment & Plan:  COVID pneumonia/ ARDS  HAP Started antibiotics.  Follow sputum culture DC CVL after  getting peripheral access Continue baricitinib, steroids.  Finished remdesivir.  H/o copd with chronic hypoxemic respiratory failure (2 to 3 L at baseline):  12/2017FEV1 0.92 (44%), last seen in office by Dr. Vaughan Browner 12/2018, not on steroids then Continue bronchodilators, Dulera Finished 10 days of steroids  Acute metabolic encephalopathy due to hypoxemia from COVID At risk delirium  Continue Precedex.  Minimize sedation Home BuSpar, Elavil, Neurontin Restart elavil, melatonin at night. Start klonazepam Seroquel added 2/1.  AKI on  CKD3 - baseline cr 1.1 range Follow urine output, BMP  CAD , HTN HFpEF:  Wean pressors as tolerated Hold amlodipine, isosorbide mono nitrate On home pravastatin  Persistent Atrial fib Continue Eliquis for anticoagulation  DM2 SSI, NovoLog Holding levemir due to hyperglycemia  Thrombocytopenia, stable  Follow CBC  Tremors with  ? myoclonus when sedation lifted:  Hold propranolol 20 mg EEG reassuring, no evidence epileptic activity  CODE STATUS: DNR , no reintubation has not been extubated per discussion with son [name] practice:  Diet: tube feeding and free water Pain/Anxiety/Delirium protocol (if indicated): Precedex/fentanyl, goal RASS 0. Wean sedation VAP protocol (if indicated): yes DVT prophylaxis: Eliquis GI prophylaxis: famotidine Glucose control: SSI q4h accuchecks, tf coverage Mobility: bed rest Code Status: DNR Family Communication:  Son [name] updated 2/5. Called 2/6 but no answer on phone. Disposition:  ICU  The patient is critically ill with multiple organ system failure and requires high complexity decision making for assessment and support, frequent evaluation and titration of therapies, advanced monitoring, review of radiographic studies and interpretation of complex data.   Critical Care Time devoted to patient care services, exclusive of separately billable procedures, described in this note is 35 minutes.    Marshell Garfinkel MD [place] [facility] Please see [email or web address] for pager details.  12/25/2019, 8:17 AM

## 2019-12-25 NOTE — Progress Notes (Signed)
Patient has not voided this AM. Bladder scan done, shows >500. Has had straight cath x3. MD notified and orders to place a foley received.

## 2019-12-25 NOTE — Progress Notes (Signed)
Patients son called and I updated him per chart. Answered all questions. [name]

## 2019-12-25 NOTE — Progress Notes (Signed)
Pt's sats have remained low during the shift.  Continues to be on 100% Fio2 on the vent.  BP has improved with Levophed. Pt is still not voiding much on her own.  Required straight cath during the night.  Pure wick remains in place.

## 2019-12-25 NOTE — Progress Notes (Signed)
CVP laying flat - 11

## 2019-12-25 NOTE — Progress Notes (Signed)
Around this time Elink MD Dr. Silvano Rusk called to check on pt.  Informed of pt's status.  Orders were given.  (See new orders).

## 2019-12-26 ENCOUNTER — Inpatient Hospital Stay (HOSPITAL_COMMUNITY): Payer: Medicare Other

## 2019-12-26 LAB — CULTURE, RESPIRATORY W GRAM STAIN: Special Requests: NORMAL

## 2019-12-26 LAB — BASIC METABOLIC PANEL
Anion gap: 7 (ref 5–15)
BUN: 98 mg/dL — ABNORMAL HIGH (ref 8–23)
CO2: 32 mmol/L (ref 22–32)
Calcium: 8.1 mg/dL — ABNORMAL LOW (ref 8.9–10.3)
Chloride: 111 mmol/L (ref 98–111)
Creatinine, Ser: 1.48 mg/dL — ABNORMAL HIGH (ref 0.44–1.00)
GFR calc Af Amer: 38 mL/min — ABNORMAL LOW (ref >60)
GFR calc non Af Amer: 33 mL/min — ABNORMAL LOW (ref >60)
Glucose, Bld: 103 mg/dL — ABNORMAL HIGH (ref 70–99)
Potassium: 4.2 mmol/L (ref 3.5–5.1)
Sodium: 150 mmol/L — ABNORMAL HIGH (ref 135–145)

## 2019-12-26 LAB — GLUCOSE, CAPILLARY
Glucose-Capillary: 113 mg/dL — ABNORMAL HIGH (ref 70–99)
Glucose-Capillary: 138 mg/dL — ABNORMAL HIGH (ref 70–99)
Glucose-Capillary: 147 mg/dL — ABNORMAL HIGH (ref 70–99)
Glucose-Capillary: 159 mg/dL — ABNORMAL HIGH (ref 70–99)
Glucose-Capillary: 195 mg/dL — ABNORMAL HIGH (ref 70–99)
Glucose-Capillary: 214 mg/dL — ABNORMAL HIGH (ref 70–99)
Glucose-Capillary: 224 mg/dL — ABNORMAL HIGH (ref 70–99)
Glucose-Capillary: 250 mg/dL — ABNORMAL HIGH (ref 70–99)
Glucose-Capillary: 73 mg/dL (ref 70–99)

## 2019-12-26 LAB — MAGNESIUM: Magnesium: 2.6 mg/dL — ABNORMAL HIGH (ref 1.7–2.4)

## 2019-12-26 LAB — CBC
HCT: 38.4 % (ref 36.0–46.0)
Hemoglobin: 11.1 g/dL — ABNORMAL LOW (ref 12.0–15.0)
MCH: 30 pg (ref 26.0–34.0)
MCHC: 28.9 g/dL — ABNORMAL LOW (ref 30.0–36.0)
MCV: 103.8 fL — ABNORMAL HIGH (ref 80.0–100.0)
Platelets: 208 10*3/uL (ref 150–400)
RBC: 3.7 MIL/uL — ABNORMAL LOW (ref 3.87–5.11)
RDW: 13.2 % (ref 11.5–15.5)
WBC: 6.4 10*3/uL (ref 4.0–10.5)
nRBC: 0 % (ref 0.0–0.2)

## 2019-12-26 LAB — PROCALCITONIN: Procalcitonin: 1.11 ng/mL

## 2019-12-26 LAB — PHOSPHORUS: Phosphorus: 3.8 mg/dL (ref 2.5–4.6)

## 2019-12-26 MED ORDER — FUROSEMIDE 10 MG/ML IJ SOLN
40.0000 mg | Freq: Once | INTRAMUSCULAR | Status: AC
  Administered 2019-12-26: 40 mg via INTRAVENOUS
  Filled 2019-12-26: qty 4

## 2019-12-26 MED ORDER — FREE WATER
300.0000 mL | Status: DC
  Administered 2019-12-26 – 2019-12-27 (×7): 300 mL

## 2019-12-26 MED ORDER — VANCOMYCIN HCL 1750 MG/350ML IV SOLN
1750.0000 mg | INTRAVENOUS | Status: DC
  Administered 2019-12-26: 10:00:00 1750 mg via INTRAVENOUS
  Filled 2019-12-26: qty 350

## 2019-12-26 MED ORDER — SENNOSIDES 8.8 MG/5ML PO SYRP
5.0000 mL | ORAL_SOLUTION | Freq: Two times a day (BID) | ORAL | Status: DC
  Administered 2019-12-26 – 2019-12-27 (×3): 5 mL via ORAL
  Filled 2019-12-26 (×3): qty 5

## 2019-12-26 MED ORDER — CEFAZOLIN SODIUM-DEXTROSE 2-4 GM/100ML-% IV SOLN
2.0000 g | Freq: Three times a day (TID) | INTRAVENOUS | Status: DC
  Administered 2019-12-26 – 2019-12-27 (×4): 2 g via INTRAVENOUS
  Filled 2019-12-26 (×5): qty 100

## 2019-12-26 MED ORDER — POLYETHYLENE GLYCOL 3350 17 G PO PACK
17.0000 g | PACK | Freq: Two times a day (BID) | ORAL | Status: DC
  Administered 2019-12-26 – 2019-12-27 (×3): 17 g via ORAL
  Filled 2019-12-26 (×3): qty 1

## 2019-12-26 NOTE — Progress Notes (Signed)
Called and updated pt's son, [name].  Pt remains sedated while on vent. Sats did start going in mid to low 80s.  After turning to Rt side, sats improved to 89%. Answered any further questions to best of ability by nurse.

## 2019-12-26 NOTE — Progress Notes (Signed)
NAME:  [name], MRN:  [id number], DOB:  [birth year], LOS: 27 ADMISSION DATE:  12/10/2019, CONSULTATION DATE:   December 12, 2019 REFERRING MD: Dr. Owens Shark, CHIEF COMPLAINT:  Brought in by EMS confused, hypoxemic  Brief History   85 year old NHR recently diagnosed with Covid pneumonia brought to the [facility] emergency room combative hypoxemic on January 24 .  Required intubation, transferred to [facility] for further management.  Past Medical History  Squamous cell carcinoma of the nose COPD on 2 L oxygen Coronary artery disease  Eliquis for atrial fibrillation. Hyperlipidemia Hypertension GERD Osteoarthritis  Significant Hospital Events   1/24 intubation/admit 1/25:  Some agitation reported overnight. On precedex infusion. afib with rvr overnight but now rate controlled.  1/26:  Pt with tremulous/jerking-like motion when sedation weaned. Family states has baseline tremor for which she is on propanolol. This was restarted yesterday late morning and eeg ordered. Overnight still with episodes so was started on keppra and prn versed. eeg still pending. tmax 102.7 overnight. On 50%/10 on PRVC. All labs still pending this am 1/27: pt lost iv access yesterday so cvc placed. Also appears to have had more episodes yesterday that were seizure like but consistent with episode noted on eeg that is NOT a seizure. ? pseudosz activity although pt is on sedation so less likely. Also known to have tremors per pt's family although these episodes seem more pronounced. Diarrhea overnight as well as temp to 102.9 1/30 agitation 1/31 tolerating spontaneous breathing trial on pressure support 8/10 2/4 increasing O2, PEEP requirement 2/5 started antibiotics for HAP  Consults:    Procedures:  1/24 ETT >  1/26 RIJ CVL->  Significant Diagnostic Tests:  1/8 cth: No acute intracranial abnormality.   Findings consistent with age related atrophy and chronic small vessel ischemia   Small unchanged  lacunar infarct in the left basal ganglia. 1/9 MRI brain: 1. Severely motion degraded examination. 2. No acute infarct identified. 3. Chronic left basal ganglia lacunar infarcts. 1/11 ECHO:  [id number] Grade 1 diastolic dysfunction  [id number] eeg: Severe diffuse encephalopathy, no EEG correlate with upward deviated eyes and whole body shaking, no seizure activity  Micro Data:  1/24 POC SARS CoV2 > positive 1/24 urine: ngtd 1/24 blood: ngtd 1/26 resp: Normal flora   2/5 respiratory- Staph aureus 2/5 blood-  Antimicrobials:  1/24 decadron >  2/4 1/24 remdesivir > 1/28 1/24 baricitinib-> 2/5  Cefepime 2/6 >> Vanco 2/6 >>  Interim history/subjective:   Remains on the ventilator, off Levophed today morning.  Objective   Blood pressure 104/77, pulse 81, temperature 99.1 F (37.3 C), temperature source Oral, resp. rate (!) 26, height 5\' 4"  (1.626 m), weight 103.5 kg, SpO2 (!) 86 %. CVP:  [9 mmHg-16 mmHg] 9 mmHg  Vent Mode: PRVC FiO2 (%):  [100 %] 100 % Set Rate:  [28 bmp] 28 bmp Vt Set:  [430 mL-450 mL] 430 mL PEEP:  [12 cmH20-14 cmH20] 14 cmH20 Plateau Pressure:  [19 cmH20-28 cmH20] 27 cmH20   Intake/Output Summary (Last 24 hours) at 12/26/2019 0806 Last data filed at 12/26/2019 0610 Gross per 24 hour  Intake 2185 ml  Output 2625 ml  Net -440 ml   Filed Weights   12/24/19 0500 12/25/19 0500 12/26/19 0459  Weight: 101.5 kg 102.4 kg 103.5 kg    Examination: Gen:      No acute distress HEENT:  EOMI, sclera anicteric Neck:     No masses; no thyromegaly, ETT Lungs:    Clear to  auscultation bilaterally; normal respiratory effort CV:         Regular rate and rhythm; no murmurs Abd:      + bowel sounds; soft, non-tender; no palpable masses, no distension Ext:    No edema; adequate peripheral perfusion Skin:      Warm and dry; no rash Neuro: Sedated, unresponsive  Resolved Hospital Problem list    Hypotension -in setting of propfol use  Assessment & Plan:  COVID  pneumonia/ ARDS- Finished course of remdesivir, steroids, persistent Staph pneumonia Continue Vanco, cefepime.  Follow cultures Lasix 40 mg x 1  H/o copd with chronic hypoxemic respiratory failure (2 to 3 L at baseline):  12/2017FEV1 0.92 (44%), last seen in office by Dr. Vaughan Browner 12/2018, not on steroids then Continue bronchodilators, Dulera  Acute metabolic encephalopathy due to hypoxemia from COVID At risk delirium  Continue Precedex.  Minimize sedation Home BuSpar, Elavil, Neurontin On clonazepam and Seroquel for agitation.  AKI on  CKD3 - baseline cr 1.1 range Hypernatremia Increase free water Follow urine output, BMP  CAD , HTN HFpEF:  Hold amlodipine, isosorbide mono nitrate On home pravastatin  Persistent Atrial fib Continue Eliquis for anticoagulation  DM2 SSI, NovoLog Holding Levemir due to hypoglycemia  Thrombocytopenia, stable  Follow CBC  Constipation Start bowel regimen  Tremors with  ? myoclonus when sedation lifted:  Hold propranolol 20 mg EEG reassuring, no evidence epileptic activity  CODE STATUS:  DNR Discussed with son [name] on 2/7.  He is clear that she would not want long-term support or tracheostomy She has a setback in the past few days and I doubt she will be able to wean off the vent successfully We have agreed to give it a few days.  If no progress then he is open to terminal extubation and comfort care.   Best practice:  Diet: tube feeding and free water Pain/Anxiety/Delirium protocol (if indicated): Precedex/fentanyl, goal RASS 0. Wean sedation VAP protocol (if indicated): yes DVT prophylaxis: Eliquis GI prophylaxis: famotidine Glucose control: SSI q4h accuchecks, tf coverage Mobility: bed rest Code Status: DNR Family Communication:  See above Disposition:  ICU  The patient is critically ill with multiple organ system failure and requires high complexity decision making for assessment and support, frequent evaluation and titration  of therapies, advanced monitoring, review of radiographic studies and interpretation of complex data.   Critical Care Time devoted to patient care services, exclusive of separately billable procedures, described in this note is 35 minutes.   Marshell Garfinkel MD [facility] Please see [email or web address] for pager details.  12/26/2019, 8:06 AM

## 2019-12-27 ENCOUNTER — Inpatient Hospital Stay (HOSPITAL_COMMUNITY): Payer: Medicare Other

## 2019-12-27 LAB — CBC
HCT: 38.8 % (ref 36.0–46.0)
Hemoglobin: 11.4 g/dL — ABNORMAL LOW (ref 12.0–15.0)
MCH: 30.6 pg (ref 26.0–34.0)
MCHC: 29.4 g/dL — ABNORMAL LOW (ref 30.0–36.0)
MCV: 104 fL — ABNORMAL HIGH (ref 80.0–100.0)
Platelets: 215 10*3/uL (ref 150–400)
RBC: 3.73 MIL/uL — ABNORMAL LOW (ref 3.87–5.11)
RDW: 13.7 % (ref 11.5–15.5)
WBC: 8 10*3/uL (ref 4.0–10.5)
nRBC: 0 % (ref 0.0–0.2)

## 2019-12-27 LAB — GLUCOSE, CAPILLARY
Glucose-Capillary: 136 mg/dL — ABNORMAL HIGH (ref 70–99)
Glucose-Capillary: 198 mg/dL — ABNORMAL HIGH (ref 70–99)
Glucose-Capillary: 289 mg/dL — ABNORMAL HIGH (ref 70–99)
Glucose-Capillary: 149 mg/dL — ABNORMAL HIGH (ref 70–99)

## 2019-12-27 LAB — BASIC METABOLIC PANEL
Anion gap: 9 (ref 5–15)
BUN: 85 mg/dL — ABNORMAL HIGH (ref 8–23)
CO2: 33 mmol/L — ABNORMAL HIGH (ref 22–32)
Calcium: 8.4 mg/dL — ABNORMAL LOW (ref 8.9–10.3)
Chloride: 107 mmol/L (ref 98–111)
Creatinine, Ser: 1.33 mg/dL — ABNORMAL HIGH (ref 0.44–1.00)
GFR calc Af Amer: 43 mL/min — ABNORMAL LOW (ref >60)
GFR calc non Af Amer: 37 mL/min — ABNORMAL LOW (ref >60)
Glucose, Bld: 141 mg/dL — ABNORMAL HIGH (ref 70–99)
Potassium: 4.2 mmol/L (ref 3.5–5.1)
Sodium: 149 mmol/L — ABNORMAL HIGH (ref 135–145)

## 2019-12-27 LAB — PHOSPHORUS: Phosphorus: 3.5 mg/dL (ref 2.5–4.6)

## 2019-12-27 LAB — MAGNESIUM: Magnesium: 2.4 mg/dL (ref 1.7–2.4)

## 2019-12-27 MED ORDER — GLYCOPYRROLATE 1 MG PO TABS
1.0000 mg | ORAL_TABLET | ORAL | Status: DC | PRN
  Filled 2019-12-27: qty 1

## 2019-12-27 MED ORDER — GLYCOPYRROLATE 0.2 MG/ML IJ SOLN: 0.2000 mg | INTRAMUSCULAR | Status: DC | PRN

## 2019-12-27 MED ORDER — DEXTROSE 5 % IV SOLN: INTRAVENOUS | Status: DC

## 2019-12-27 MED ORDER — MORPHINE SULFATE (PF) 2 MG/ML IV SOLN: 2.0000 mg | INTRAVENOUS | Status: DC | PRN

## 2019-12-27 MED ORDER — DIPHENHYDRAMINE HCL 50 MG/ML IJ SOLN: 25.0000 mg | INTRAMUSCULAR | Status: DC | PRN

## 2019-12-27 MED ORDER — POLYVINYL ALCOHOL 1.4 % OP SOLN
1.0000 [drp] | Freq: Four times a day (QID) | OPHTHALMIC | Status: DC | PRN
  Filled 2019-12-27: qty 15

## 2019-12-27 MED ORDER — ACETAMINOPHEN 325 MG PO TABS: 650.0000 mg | ORAL_TABLET | Freq: Four times a day (QID) | ORAL | Status: DC | PRN

## 2019-12-27 MED ORDER — MORPHINE 100MG IN NS 100ML (1MG/ML) PREMIX INFUSION
0.0000 mg/h | INTRAVENOUS | Status: DC
  Administered 2019-12-27: 5 mg/h via INTRAVENOUS
  Filled 2019-12-27: qty 100

## 2019-12-27 MED ORDER — APIXABAN 5 MG PO TABS: 5.0000 mg | ORAL_TABLET | Freq: Two times a day (BID) | ORAL | Status: DC

## 2019-12-27 MED ORDER — MIDAZOLAM HCL 2 MG/2ML IJ SOLN: 2.0000 mg | INTRAMUSCULAR | Status: DC | PRN

## 2019-12-27 MED ORDER — ACETAMINOPHEN 650 MG RE SUPP: 650.0000 mg | Freq: Four times a day (QID) | RECTAL | Status: DC | PRN

## 2019-12-27 MED ORDER — MORPHINE BOLUS VIA INFUSION
5.0000 mg | INTRAVENOUS | Status: DC | PRN
  Filled 2019-12-27: qty 5

## 2019-12-29 LAB — CULTURE, BLOOD (ROUTINE X 2)
Culture: NO GROWTH
Culture: NO GROWTH
Special Requests: ADEQUATE
Special Requests: ADEQUATE

## 2020-01-17 NOTE — Progress Notes (Signed)
NAME:  [name], MRN:  [id number], DOB:  1934-12-01, LOS: 71 ADMISSION DATE:  12/02/2019, CONSULTATION DATE:  1/24 REFERRING MD:  Owens Shark, CHIEF COMPLAINT:  Confusion, Hypoxemia   Brief History   85 y/o female with COVID pneumonia, came to the [facility] ED on 1/24, intubated, moved to [facility] for further evaluation.  Past Medical History  Squamous cell carcinoma of the nose COPD on 2 L oxygen Coronary artery disease  Eliquis for atrial fibrillation. Hyperlipidemia Hypertension GERD Osteoarthritis  Significant Hospital Events   1/24 intubation/admit 1/25:  Some agitation reported overnight. On precedex infusion. afib with rvr overnight but now rate controlled.  1/26:  Pt with tremulous/jerking-like motion when sedation weaned. Family states has baseline tremor for which she is on propanolol. This was restarted yesterday late morning and eeg ordered. Overnight still with episodes so was started on keppra and prn versed. eeg still pending. tmax 102.7 overnight. On 50%/10 on PRVC. All labs still pending this am 1/27: pt lost iv access yesterday so cvc placed. Also appears to have had more episodes yesterday that were seizure like but consistent with episode noted on eeg that is NOT a seizure. ? pseudosz activity although pt is on sedation so less likely. Also known to have tremors per pt's family although these episodes seem more pronounced. Diarrhea overnight as well as temp to 102.9 1/30 agitation 1/31 tolerating spontaneous breathing trial on pressure support 8/10 2/4 increasing O2, PEEP requirement 2/5 started antibiotics for HAP  Consults:    Procedures:  1/24 ETT >  1/26 R IJ CVL >   Significant Diagnostic Tests:  1/8 cth: No acute intracranial abnormality.  Findings consistent with age related atrophy and chronic small vessel ischemia  Small unchanged lacunar infarct in the left basal ganglia. 1/9 MRI brain: 1. Severely motion degraded examination.2. No acute infarct  identified. 3. Chronic left basal ganglia lacunar infarcts. 1/11 ECHO: [id number] 0000000 Grade 1 diastolic dysfunction  [id number] eeg: Severe diffuse encephalopathy, no EEG correlate with upward deviated eyes and whole body shaking, no seizure activity  Micro Data:  1/24 POC SARS CoV2 > positive 1/24 urine: ngtd 1/24 blood: ngtd 1/26 resp: Normal flora   2/5 respiratory- MSSA 2/5 blood-  Antimicrobials:  1/24 remdesivir > 1/28 1/24 baricitinib > 2/5 1/24 decadron > 2/4  2/5 cefepime > 2/7 2/5 vanc > 2/7 2/7 cefazolin >   Interim history/subjective:  Worsening hypoxemia despite 100% FiO2, 14PEEP  Objective   Blood pressure 133/79, pulse (!) 107, temperature 99.3 F (37.4 C), temperature source Axillary, resp. rate (!) 29, height 5\' 4"  (1.626 m), weight 103.5 kg, SpO2 (!) 88 %.    Vent Mode: PRVC FiO2 (%):  [100 %] 100 % Set Rate:  [28 bmp] 28 bmp Vt Set:  [430 mL] 430 mL PEEP:  [14 cmH20] 14 cmH20 Plateau Pressure:  [26 cmH20-31 cmH20] 26 cmH20   Intake/Output Summary (Last 24 hours) at 01-17-2020 [id number] Last data filed at 01-17-20 0700 Gross per 24 hour  Intake 3889.44 ml  Output 3500 ml  Net 389.44 ml   Filed Weights   12/24/19 0500 12/25/19 0500 12/26/19 0459  Weight: 101.5 kg 102.4 kg 103.5 kg    Examination: General:  In bed on vent HENT: NCAT ETT in place PULM: CTA B, vent supported breathing CV: RRR, no mgr GI: BS+, soft, nontender MSK: normal bulk and tone Neuro: sedated on vent   Resolved Hospital Problem list     Assessment & Plan:  ARDS due to  COVID 19 > worsening despite full vent support Staph pneumonia COPD with hypoxemia Acute metabolic encephalopathy due to hypoxemi from COVID AKI Hypernatremia CAD, HTN HFpEF Persistent Atrial Fib DM2 Thrombocytopenia Constipation Tremor with myoclonus  Her condition is worsening after two weeks of ICU level care.  Given her baseline advanced age and comorbid illnesses it is clear that she will not  survive this.    I called and spoke with her son [name] about this at length today.  I explained that we need to withdraw care and focus on her comfort because what we are doing now is not medically beneficial.  He voiced understanding.  For now continue full vent support with sedation Transition to comfort measures and withdraw care after a family visit later today No more labs Do not escalate vent support or add vasopressors   Code status DNR   Best practice:  Diet: tube feeding Pain/Anxiety/Delirium protocol (if indicated): yes, continue precedex VAP protocol (if indicated): yes DVT prophylaxis: continue for now GI prophylaxis: continue for now Glucose control: hold further checks Mobility: bed rest Code Status: dnr Family Communication: see above Disposition: remain in ICU  Labs   CBC: Recent Labs  Lab 12/23/19 0420 12/23/19 0420 12/24/19 0450 12/25/19 0321 12/25/19 0551 12/26/19 0420 13-Jan-2020 0405  WBC 12.3*  --  13.8*  --  8.0 6.4 8.0  HGB 13.5   < > 13.4 12.2 11.5* 11.1* 11.4*  HCT 43.8   < > 43.7 36.0 39.2 38.4 38.8  MCV 98.0  --  100.0  --  103.4* 103.8* 104.0*  PLT 243  --  278  --  223 208 215   < > = values in this interval not displayed.    Basic Metabolic Panel: Recent Labs  Lab 12/23/19 0420 12/23/19 0420 12/24/19 0450 12/25/19 0321 12/25/19 0551 12/26/19 0420 Jan 13, 2020 0405  NA 143   < > 142 143 144 150* 149*  K 3.9   < > 4.2 4.0 4.0 4.2 4.2  CL 102  --  100  --  101 111 107  CO2 32  --  30  --  27 32 33*  GLUCOSE 35*  --  208*  --  191* 103* 141*  BUN 75*  --  94*  --  108* 98* 85*  CREATININE 1.23*  --  1.74*  --  1.79* 1.48* 1.33*  CALCIUM 8.3*  --  8.2*  --  7.5* 8.1* 8.4*  MG 2.2  --  2.3  --  2.5* 2.6* 2.4  PHOS 4.0  --  5.3*  --  5.3* 3.8 3.5   < > = values in this interval not displayed.   GFR: Estimated Creatinine Clearance: 38.2 mL/min (A) (by C-G formula based on SCr of 1.33 mg/dL (H)). Recent Labs  Lab 12/24/19 0450  12/25/19 0551 12/26/19 0420 Jan 13, 2020 0405  PROCALCITON 0.24 1.15 1.11  --   WBC 13.8* 8.0 6.4 8.0    Liver Function Tests: No results for input(s): AST, ALT, ALKPHOS, BILITOT, PROT, ALBUMIN in the last 168 hours. No results for input(s): LIPASE, AMYLASE in the last 168 hours. No results for input(s): AMMONIA in the last 168 hours.  ABG    Component Value Date/Time   PHART 7.369 12/25/2019 0321   PCO2ART 51.4 (H) 12/25/2019 0321   PO2ART 54.0 (L) 12/25/2019 0321   HCO3 29.6 (H) 12/25/2019 0321   TCO2 31 12/25/2019 0321   O2SAT 86.0 12/25/2019 0321     Coagulation Profile: No results  for input(s): INR, PROTIME in the last 168 hours.  Cardiac Enzymes: No results for input(s): CKTOTAL, CKMB, CKMBINDEX, TROPONINI in the last 168 hours.  HbA1C: Hgb A1c MFr Bld  Date/Time Value Ref Range Status  11/21/2019 05:05 PM 8.0 (H) 4.8 - 5.6 % Final    Comment:    (NOTE) Pre diabetes:          5.7%-6.4% Diabetes:              >6.4% Glycemic control for   <7.0% adults with diabetes   10/06/2017 04:47 AM 6.5 (H) 4.8 - 5.6 % Final    Comment:    (NOTE) Pre diabetes:          5.7%-6.4% Diabetes:              >6.4% Glycemic control for   <7.0% adults with diabetes     CBG: Recent Labs  Lab 12/26/19 1508 12/26/19 2033 12/26/19 2333 01/04/2020 0426 01/13/2020 0727  GLUCAP 159* 224* 214* 136* 198*     Critical care time: 35 minutes     Roselie Awkward, MD [facility] [facility] Pager: [phone] Cell: [phone] If no response, call [phone]

## 2020-01-17 NOTE — Progress Notes (Signed)
RN has attempted to call [name] (grandson) to coordinate visitation. No answer

## 2020-01-17 NOTE — Progress Notes (Signed)
Spoke with [facility] regarding time of death 02/24/2318.  Referral number [id number].

## 2020-01-17 NOTE — Progress Notes (Signed)
Call from [facility]. Patient's grandson is at the hospital wishing to see the patient. Currently preparing to bring patient down for end of life visit with grandson whenever we are able to do so.

## 2020-01-17 NOTE — Progress Notes (Signed)
Patient confirmed expired by two RN's, 70ml morphine wasted from bag, Dr. Vanita Ingles, [place], [place], patient placement, and POA [name] (son) notified of patients time of death.

## 2020-01-17 NOTE — Death Summary Note (Signed)
DEATH SUMMARY   Patient Details  Name: [name] MRN: [id number] DOB: 1935/05/31  Admission/Discharge Information   Admit Date:  2020/01/08  Date of Death: Date of Death: January 23, 2020  Time of Death: Time of Death: 03-08-18  Length of Stay: 02/28/23  Referring Physician: Leonard Downing, MD   Reason(s) for Hospitalization  COVID 19  Diagnoses  Preliminary cause of death:   COVID 2023/03/03 Secondary Diagnoses (including complications and co-morbidities):  Active Problems:   COVID-19 virus infection   Brief Hospital Course (including significant findings, care, treatment, and services provided and events leading to death)  [name] is a 85 y.o. year old female with COVID pneumonia, came to the [facility] ED on January 07, 2023, intubated, moved to [facility] for further evaluation  Past Medical History  Squamous cell carcinoma of the nose COPD on 2 L oxygen Coronary artery disease Eliquis for atrial fibrillation. Hyperlipidemia Hypertension GERD Osteoarthritis  Significant Hospital Events   Jan 07, 2023 intubation/admit 1/25: Some agitation reported overnight. On precedex infusion. afib with rvr overnight but now rate controlled.  1/26: Pt with tremulous/jerking-like motion when sedation weaned. Family states has baseline tremor for which she is on propanolol. This was restarted yesterday late morning and eeg ordered. Overnight still with episodes so was started on keppra and prn versed. eeg still pending. tmax 102.7 overnight. On 50%/10 on PRVC. All labs still pending this am 1/27:pt lost iv access yesterday so cvc placed. Also appears to have had more episodes yesterday that were seizure like but consistent with episode noted on eeg that is NOT a seizure. ? pseudosz activity although pt is on sedation so less likely. Also known to have tremors per pt's family although these episodes seem more pronounced. Diarrhea overnight as well as temp to 102.9 1/30 agitation 1/31 tolerating spontaneous breathing  trial on pressure support 8/10 2/4 increasing O2, PEEP requirement 2/5 started antibiotics for HAP   By 01/22/2023 her oxygenation had significantly worsened.  By this point she had been intubated for nearly 14 days and had no chance of meaningful recovery based on the severity of her illness and her advanced age.  After discussing her situation with her family we changed our goals of care to comfort measures and withdrew mechanical ventilator support.    Pertinent Labs and Studies  Significant Diagnostic Studies DG Chest 1 View  Result Date: 12/14/2019 CLINICAL DATA:  Central line placement. EXAM: CHEST  1 VIEW COMPARISON:  None. FINDINGS: There is stable endotracheal tube and nasogastric tube positioning. A right internal jugular venous catheter is seen with its distal tip noted at the junction of the superior vena cava and right atrium. This represents a new finding when compared to the prior exam. Mild infiltrate is seen within the left lung base. There is no evidence of a pleural effusion or pneumothorax. The cardiac silhouette is markedly enlarged. The arteries moderate severity calcification of the aortic arch. Degenerative changes seen within the thoracic spine. IMPRESSION: 1. Interval right internal jugular venous catheter placement and positioning, as described above, when compared to the prior study dated December 14, 1999. Electronically Signed   By: Virgina Norfolk M.D.   On: 12/14/2019 18:44   DG Chest Port 1 View  Result Date: 01/23/2020 CLINICAL DATA:  Ventilator support.  Coronavirus infection. EXAM: PORTABLE CHEST 1 VIEW COMPARISON:  12/26/2019 FINDINGS: Patient rotated towards the right. Endotracheal tube tip 4 cm above the carina. Orogastric or nasogastric tube enters the abdomen. Right arm PICC tip in the SVC above  the right atrium. Widespread mid and lower lung predominant pulmonary infiltrates persist, similar in appearance to the study of yesterday. There may be slightly more airspace  filling on the left. IMPRESSION: Lines and tubes well positioned. Similar appearance of bilateral infiltrates. Possible slight worsening of airspace filling on the left. Electronically Signed   By: Nelson Chimes M.D.   On: 2020-01-15 03:37   DG Chest Port 1 View  Result Date: 12/26/2019 CLINICAL DATA:  85 year old female COVID-33.  Respiratory failure. EXAM: PORTABLE CHEST 1 VIEW COMPARISON:  Portable chest 12/25/2019 and earlier. FINDINGS: Portable AP semi upright view at 0408 hours. Increased kyphotic positioning. Endotracheal tube tip at the level the clavicles. Enteric tube courses to the abdomen, tip not included. Stable right PICC line. Stable lung volumes and cardiomegaly. Calcified aortic atherosclerosis. Coarse bilateral basilar interstitial opacity with basilar predominant additional patchy and veiling opacity. Ventilation is stable since 12/24/2019. Small pleural effusions are possible. Stable visible lower spinal hardware. IMPRESSION: 1.  Stable lines and tubes. 2. Stable bilateral COVID-19 pneumonia since 12/24/2019. Possible small pleural effusions. Electronically Signed   By: Genevie Ann M.D.   On: 12/26/2019 07:31   DG Chest Port 1 View  Result Date: 12/25/2019 CLINICAL DATA:  Respiratory failure. COVID-19 last month. EXAM: PORTABLE CHEST 1 VIEW COMPARISON:  12/24/2019 FINDINGS: A RIGHT IJ central venous catheter has been removed. Endotracheal tube and NG tube are again noted. A RIGHT PICC line is now identified with tip overlying the LOWER SVC. Cardiomegaly and bilateral interstitial/airspace opacities in the LOWER lungs again noted. There is no evidence of pneumothorax. IMPRESSION: 1. RIGHT PICC line placement and removal of RIGHT IJ central venous catheter. Otherwise unchanged appearance of the chest with bilateral interstitial/airspace opacities. Electronically Signed   By: Margarette Canada M.D.   On: 12/25/2019 05:25   DG Chest Port 1 View  Result Date: 12/24/2019 CLINICAL DATA:  85 year old  female with respiratory failure, COVID-19 last month. EXAM: PORTABLE CHEST 1 VIEW COMPARISON:  Portable chest 12/22/2019 and earlier. FINDINGS: Portable AP semi upright view at 0540 hours. Endotracheal tube tip in good position just below the clavicles. Enteric tube courses to the abdomen, tip not included. Stable right IJ central line. Stable lung volumes and cardiomegaly. Improving left lung base ventilation since 12/21/2019 but increasing Patchy and confluent right lower lung opacity since yesterday. Coarse bilateral interstitial opacity with basilar predominance elsewhere. No pneumothorax or pleural effusion. Partially visible lower spinal hardware. IMPRESSION: 1.  Stable lines and tubes. 2. Suspected bilateral COVID-19 pneumonia with increasing right lung base opacity, but improving left lung base ventilation recently. Electronically Signed   By: Genevie Ann M.D.   On: 12/24/2019 08:21   DG Chest Port 1 View  Result Date: 12/22/2019 CLINICAL DATA:  Acute respiratory failure. EXAM: PORTABLE CHEST 1 VIEW COMPARISON:  December 21, 2019. FINDINGS: Stable cardiomegaly. Endotracheal and nasogastric tubes are unchanged in position. Right internal jugular catheter is unchanged. Atherosclerosis of thoracic aorta is noted. No pneumothorax or bilateral pleural effusion is noted. Stable bilateral ill-defined lung opacities are noted consistent with multifocal pneumonia. Bony thorax is unremarkable. IMPRESSION: Stable support apparatus. Stable bilateral lung opacities consistent with multifocal pneumonia. Aortic Atherosclerosis (ICD10-I70.0). Electronically Signed   By: Marijo Conception M.D.   On: 12/22/2019 08:54   DG Chest Port 1 View  Result Date: 12/21/2019 CLINICAL DATA:  Acute respiratory failure, COVID-19 EXAM: PORTABLE CHEST 1 VIEW COMPARISON:  Portable exam 0551 hours compared to 12/20/2019 FINDINGS: Tip of endotracheal tube projects  3.4 cm above carina. Nasogastric tube extends into stomach. RIGHT jugular line tip  projects over SVC. Enlargement of cardiac silhouette. Atherosclerotic calcification aorta. BILATERAL pulmonary infiltrates in the mid to lower lungs consistent with multifocal pneumonia. Small RIGHT pleural effusion. No pneumothorax. IMPRESSION: Persistent BILATERAL pulmonary infiltrates consistent with multifocal pneumonia. Small RIGHT pleural effusion. Electronically Signed   By: Lavonia Dana M.D.   On: 12/21/2019 08:56   DG Chest Port 1 View  Result Date: 12/20/2019 CLINICAL DATA:  Respiratory failure EXAM: PORTABLE CHEST 1 VIEW COMPARISON:  12/19/2019 FINDINGS: Cardiac shadow is mildly enlarged but stable. Endotracheal tube, gastric catheter and right jugular central line are again seen and stable. There is been mild increase in bilateral opacities when compared with the prior exam. No pneumothorax is seen. No sizable effusion is seen. No bony abnormality is noted. IMPRESSION: Slight increase in bilateral opacities when compared with the prior exam. Tubes and lines as described above. Electronically Signed   By: Inez Catalina M.D.   On: 12/20/2019 02:51   DG Chest Port 1 View  Result Date: 12/19/2019 CLINICAL DATA:  Follow-up exam. EXAM: PORTABLE CHEST 1 VIEW COMPARISON:  Chest radiograph 12/18/2019 FINDINGS: ET tube mid trachea. Enteric tube courses inferior to the diaphragm. Right IJ central venous catheter tip projects over the superior vena cava. Monitoring leads overlie the patient. Stable cardiac and mediastinal contours. Aortic atherosclerosis. Similar-appearing bilateral interstitial opacities. No pleural effusion or pneumothorax. IMPRESSION: Similar-appearing bilateral interstitial opacities. Stable support apparatus. Electronically Signed   By: Lovey Newcomer M.D.   On: 12/19/2019 04:14   DG Chest Port 1 View  Result Date: 12/18/2019 CLINICAL DATA:  Followup ventilator support EXAM: PORTABLE CHEST 1 VIEW COMPARISON:  12/14/2019 FINDINGS: Endotracheal tube tip is 5 cm above the carina. Orogastric  or nasogastric tube enters the abdomen. Right internal jugular central line tip is in the SVC just above the right atrium. Widespread patchy pulmonary infiltrates appear similar, possibly slightly worsened at the right lung base. No dense consolidation or lobar collapse. No visible effusion. IMPRESSION: Lines and tubes well position. Persistent widespread patchy pulmonary infiltrates. Question slight worsening at the right lung base. Electronically Signed   By: Nelson Chimes M.D.   On: 12/18/2019 06:46   DG CHEST PORT 1 VIEW  Result Date: 12/14/2019 CLINICAL DATA:  Respiratory failure. EXAM: PORTABLE CHEST 1 VIEW COMPARISON:  12/02/2019.  11/26/2019. FINDINGS: Endotracheal tube and NG tube in stable position. Stable cardiomegaly. Diffuse severe bilateral interstitial prominence, progressed from prior exams. Findings most consistent with pneumonitis. Interstitial edema cannot be excluded. No pleural effusion or pneumothorax. IMPRESSION: 1.  Endotracheal tube and NG tube in stable position. 2.  Stable cardiomegaly. 3. Diffuse severe bilateral interstitial prominence, progressed from prior exam. Findings most consistent with pneumonitis. Interstitial edema cannot be excluded. Electronically Signed   By: Marcello Moores  Register   On: 12/14/2019 07:10   DG Chest Portable 1 View  Result Date: 11/20/2019 CLINICAL DATA:  Intubation EXAM: PORTABLE CHEST 1 VIEW COMPARISON:  11/26/2019 FINDINGS: Endotracheal tube in good position. NG tube enters the stomach with the tip not visualized. Mild patchy airspace disease bilaterally with progression. No effusion. No heart failure. Cardiac enlargement. IMPRESSION: Endotracheal tube in good position Patchy bilateral airspace disease possible pneumonia. Electronically Signed   By: Franchot Gallo M.D.   On: 12/19/2019 06:49   DG Abd Portable 1 View  Result Date: 12/10/2019 CLINICAL DATA:  OG tube placement EXAM: PORTABLE ABDOMEN - 1 VIEW COMPARISON:  None. FINDINGS: Enteric tube  terminates in the proximal gastric body. Nonobstructive bowel gas pattern. Patchy opacities at the lung bases, left greater than right. Cardiomegaly. Lumbar spine fixation hardware, incompletely visualized. IMPRESSION: Enteric tube terminates in the proximal gastric body. Electronically Signed   By: Julian Hy M.D.   On: 11/29/2019 07:47   EEG adult  Result Date: 12/14/2019 Lora Havens, MD     12/14/2019 10:23 AM Patient Name: SHAQUEENA GORT MRN: [id number] Epilepsy Attending: Lora Havens Referring Physician/Provider: Dr. [name] Date: 12/14/2019 Duration: 24.34 minutes Patient history: 85 year old female recently diagnosed with Covid pneumonia was transferred from [facility] for altered mental status.  EEG to evaluate for seizures. Level of alertness: Comatose AEDs during EEG study: Keppra, Versed Technical aspects: This EEG study was done with scalp electrodes positioned according to the 10-20 International system of electrode placement. Electrical activity was acquired at a sampling rate of 500Hz  and reviewed with a high frequency filter of 70Hz  and a low frequency filter of 1Hz . EEG data were recorded continuously and digitally stored. DESCRIPTION: EEG showed continuous generalized 2-3hz  theta-delta slowing. Triphasic waves, generalized, maximal bifrontal were also noted. Per eeg tech annotation, at [phone] patient had an episode where she was noted to have whole body twitching, predominantly "upper body" with eyes deviated upwards and clenching on ET tube after tactile stimulation, lasting about 5 minutes. Concomitant eeg before, during and after event didn't show any eeg change to suggest seizure. Hyperventilation and photic stimulation were not performed. ABNORMALITY - Continuous slow, generalized - Triphasic waves, generalized IMPRESSION: This study showed evidence of severe diffuse encephalopathy, non specific to etiology but could be secondary to toxic-metabolic  etiologies.  One episode of whole body shaking with eyes deviated upwards, lasting about 5 minutes without eeg change was noted and is therefore not epileptic seizure. No seizures or epileptiform discharges were seen throughout the recording. Lora Havens   ECHOCARDIOGRAM COMPLETE  Result Date: 11/29/2019   ECHOCARDIOGRAM REPORT   Patient Name:   TRESSA JACOBSON Date of Exam: 11/29/2019 Medical Rec #:  [id number]      Height:       64.0 in Accession #:    [id number]     Weight:       219.1 lb Date of Birth:  07/21/1935      BSA:          2.03 m Patient Age:    85 years       BP:           176/73 mmHg Patient Gender: F              HR:           70 bpm. Exam Location:  Inpatient Procedure: 2D Echo, Cardiac Doppler and Color Doppler Indications:    Atrial Fibrillation 427.31  History:        Patient has prior history of Echocardiogram examinations, most                 recent 08/28/2018. CAD, COPD, Arrythmias:non-specific ST                 changes, Signs/Symptoms:Shortness of Breath and Chest Pain; Risk                 Factors:Hypertension, Dyslipidemia and Former Smoker.  Sonographer:    Paulita Fujita RDCS Referring Phys: [place]  1. Left ventricular ejection fraction, by visual estimation, is 55 to 60%. The left ventricle has  normal function. There is no left ventricular hypertrophy.  2. Left ventricular diastolic parameters are consistent with Grade I diastolic dysfunction (impaired relaxation).  3. The left ventricle has no regional wall motion abnormalities.  4. Global right ventricle has normal systolic function.The right ventricular size is normal. No increase in right ventricular wall thickness.  5. Left atrial size was normal.  6. Right atrial size was normal.  7. The mitral valve is normal in structure. No evidence of mitral valve regurgitation. No evidence of mitral stenosis.  8. The tricuspid valve is normal in structure.  9. The aortic valve is normal in structure. Aortic  valve regurgitation is not visualized. No evidence of aortic valve sclerosis or stenosis. 10. The pulmonic valve was normal in structure. Pulmonic valve regurgitation is not visualized. 11. The inferior vena cava is normal in size with greater than 50% respiratory variability, suggesting right atrial pressure of 3 mmHg. FINDINGS  Left Ventricle: Left ventricular ejection fraction, by visual estimation, is 55 to 60%. The left ventricle has normal function. The left ventricle has no regional wall motion abnormalities. There is no left ventricular hypertrophy. Left ventricular diastolic parameters are consistent with Grade I diastolic dysfunction (impaired relaxation). Normal left atrial pressure. Right Ventricle: The right ventricular size is normal. No increase in right ventricular wall thickness. Global RV systolic function is has normal systolic function. Left Atrium: Left atrial size was normal in size. Right Atrium: Right atrial size was normal in size Pericardium: There is no evidence of pericardial effusion. Mitral Valve: The mitral valve is normal in structure. No evidence of mitral valve regurgitation. No evidence of mitral valve stenosis by observation. Tricuspid Valve: The tricuspid valve is normal in structure. Tricuspid valve regurgitation is not demonstrated. Aortic Valve: The aortic valve is normal in structure. Aortic valve regurgitation is not visualized. The aortic valve is structurally normal, with no evidence of sclerosis or stenosis. Pulmonic Valve: The pulmonic valve was normal in structure. Pulmonic valve regurgitation is not visualized. Pulmonic regurgitation is not visualized. Aorta: The aortic root, ascending aorta and aortic arch are all structurally normal, with no evidence of dilitation or obstruction. Venous: The inferior vena cava is normal in size with greater than 50% respiratory variability, suggesting right atrial pressure of 3 mmHg. IAS/Shunts: No atrial level shunt detected by  color flow Doppler. There is no evidence of a patent foramen ovale. No ventricular septal defect is seen or detected. There is no evidence of an atrial septal defect.  LEFT VENTRICLE PLAX 2D LVIDd:         4.40 cm       Diastology LVIDs:         3.30 cm       LV e' lateral:   5.48 cm/s LV PW:         1.10 cm       LV E/e' lateral: 17.0 LV IVS:        1.10 cm       LV e' medial:    4.80 cm/s LV SV:         44 ml         LV E/e' medial:  19.4 LV SV Index:   20.11  LV Volumes (MOD) LV area d, A2C:    32.30 cm LV area d, A4C:    34.00 cm LV area s, A2C:    19.10 cm LV area s, A4C:    19.40 cm LV major d, A2C:   8.22 cm LV  major d, A4C:   8.11 cm LV major s, A2C:   6.83 cm LV major s, A4C:   6.68 cm LV vol d, MOD A2C: 109.0 ml LV vol d, MOD A4C: 117.0 ml LV vol s, MOD A2C: 46.7 ml LV vol s, MOD A4C: 47.2 ml LV SV MOD A2C:     62.3 ml LV SV MOD A4C:     117.0 ml LV SV MOD BP:      65.3 ml RIGHT VENTRICLE RV S prime:     14.90 cm/s TAPSE (M-mode): 2.4 cm LEFT ATRIUM           Index       RIGHT ATRIUM           Index LA diam:      4.30 cm 2.11 cm/m  RA Area:     13.00 cm LA Vol (A2C): 34.8 ml 17.11 ml/m RA Volume:   26.50 ml  13.03 ml/m LA Vol (A4C): 57.7 ml 28.37 ml/m  AORTIC VALVE LVOT Vmax:   159.00 cm/s LVOT Vmean:  113.000 cm/s LVOT VTI:    0.352 m  AORTA Ao Root diam: 2.90 cm MITRAL VALVE MV Area (PHT): 2.56 cm             SHUNTS MV PHT:        85.84 msec           Systemic VTI: 0.35 m MV Decel Time: 296 msec MV E velocity: 93.00 cm/s 103 cm/s MV A velocity: 96.30 cm/s 70.3 cm/s MV E/A ratio:  0.97       1.5  Candee Furbish MD Electronically signed by Candee Furbish MD Signature Date/Time: 11/29/2019/2:32:21 PM    Final    [place] EKG SITE RITE  Result Date: 12/24/2019 If Site Rite image not attached, placement could not be confirmed due to current cardiac rhythm.  [place] EKG SITE RITE  Result Date: 12/14/2019 If Site Rite image not attached, placement could not be confirmed due to current cardiac  rhythm.   Microbiology Recent Results (from the past 240 hour(s))  Culture, respiratory (non-expectorated)     Status: None   Collection Time: 12/24/19  8:12 AM   Specimen: Tracheal Aspirate; Respiratory  Result Value Ref Range Status   Specimen Description TRACHEAL ASPIRATE  Final   Special Requests Normal  Final   Gram Stain   Final    MODERATE WBC PRESENT, PREDOMINANTLY PMN MODERATE GRAM POSITIVE COCCI IN CLUSTERS FEW GRAM NEGATIVE RODS    Culture MODERATE STAPHYLOCOCCUS AUREUS  Final   Report Status 12/26/2019 FINAL  Final   Organism ID, Bacteria STAPHYLOCOCCUS AUREUS  Final      Susceptibility   Staphylococcus aureus - MIC*    CIPROFLOXACIN >=8 RESISTANT Resistant     ERYTHROMYCIN >=8 RESISTANT Resistant     GENTAMICIN <=0.5 SENSITIVE Sensitive     OXACILLIN <=0.25 SENSITIVE Sensitive     TETRACYCLINE <=1 SENSITIVE Sensitive     VANCOMYCIN 1 SENSITIVE Sensitive     TRIMETH/SULFA <=10 SENSITIVE Sensitive     CLINDAMYCIN <=0.25 SENSITIVE Sensitive     RIFAMPIN <=0.5 SENSITIVE Sensitive     Inducible Clindamycin NEGATIVE Sensitive     * MODERATE STAPHYLOCOCCUS AUREUS  Culture, blood (Routine X 2) w Reflex to ID Panel     Status: None (Preliminary result)   Collection Time: 12/24/19  8:50 AM   Specimen: BLOOD  Result Value Ref Range Status   Specimen Description   Final    BLOOD  RIGHT ARM Performed at [facility], [place] [address], [place], [place] [address]    Special Requests   Final    BOTTLES DRAWN AEROBIC AND ANAEROBIC Blood Culture adequate volume Performed at [place] [address], [place], [place] [address]    Culture   Final    NO GROWTH 4 DAYS Performed at [facility], [place] [address], [place], [place] [address]    Report Status PENDING  Incomplete  Culture, blood (Routine X 2) w Reflex to ID Panel     Status: None (Preliminary result)   Collection Time: 12/24/19  9:00 AM   Specimen: BLOOD  Result Value  Ref Range Status   Specimen Description   Final    BLOOD RIGHT ARM Performed at [place] [address], [place], [place] [address]    Special Requests   Final    BOTTLES DRAWN AEROBIC AND ANAEROBIC Blood Culture adequate volume Performed at [place] [address], [place], [place] [address]    Culture   Final    NO GROWTH 4 DAYS Performed at [facility], [place] [address] [place], [place] [address]    Report Status PENDING  Incomplete  MRSA PCR Screening     Status: None   Collection Time: 12/24/19  9:23 AM   Specimen: Nasal Mucosa; Nasopharyngeal  Result Value Ref Range Status   MRSA by PCR NEGATIVE NEGATIVE Final    Comment:        The GeneXpert MRSA Assay (FDA approved for NASAL specimens only), is one component of a comprehensive MRSA colonization surveillance program. It is not intended to diagnose MRSA infection nor to guide or monitor treatment for MRSA infections. Performed at [facility], [place] [place], [place] [address]     Lab Basic Metabolic Panel: Recent Labs  Lab 12/23/19 [phone] 12/23/19 0420 12/24/19 0450 12/25/19 0321 12/25/19 0551 12/26/19 0420 07-Jan-2020 0405  NA 143   < > 142 143 144 150* 149*  K 3.9   < > 4.2 4.0 4.0 4.2 4.2  CL 102  --  100  --  101 111 107  CO2 32  --  30  --  27 32 33*  GLUCOSE 35*  --  208*  --  191* 103* 141*  BUN 75*  --  94*  --  108* 98* 85*  CREATININE 1.23*  --  1.74*  --  1.79* 1.48* 1.33*  CALCIUM 8.3*  --  8.2*  --  7.5* 8.1* 8.4*  MG 2.2  --  2.3  --  2.5* 2.6* 2.4  PHOS 4.0  --  5.3*  --  5.3* 3.8 3.5   < > = values in this interval not displayed.   Liver Function Tests: No results for input(s): AST, ALT, ALKPHOS, BILITOT, PROT, ALBUMIN in the last 168 hours. No results for input(s): LIPASE, AMYLASE in the last 168 hours. No results for input(s): AMMONIA in the last 168 hours. CBC: Recent Labs  Lab 12/23/19 0420  12/23/19 0420 12/24/19 0450 12/25/19 0321 12/25/19 0551 12/26/19 0420 01/07/2020 0405  WBC 12.3*  --  13.8*  --  8.0 6.4 8.0  HGB 13.5   < > 13.4 12.2 11.5* 11.1* 11.4*  HCT 43.8   < > 43.7 36.0 39.2 38.4 38.8  MCV 98.0  --  100.0  --  103.4* 103.8* 104.0*  PLT 243  --  278  --  223 208 215   < > = values in this interval not displayed.   Cardiac Enzymes: No results for input(s): CKTOTAL, CKMB, CKMBINDEX, TROPONINI in the last 168 hours. Sepsis Labs: Recent Labs  Lab 12/24/19 0450 12/25/19 0551 12/26/19 0420 Jan 02, 2020 0405  PROCALCITON 0.24 1.15 1.11  --   WBC 13.8* 8.0 6.4 8.0    Procedures/Operations  1/24 ETT >  1/26 R IJ CVL >    Roselie Awkward 01/01/2020, 4:12 PM

## 2020-01-17 NOTE — Progress Notes (Signed)
[name] and [name] updated on extubation.  They did not have any further questions.

## 2020-01-17 NOTE — Progress Notes (Signed)
Patient extubated per MD order

## 2020-01-17 NOTE — Progress Notes (Signed)
Spoke with patient's son, [name]. Gave update on patient's current condition. Discussed end of life visit; he said that only one family member may choose to do this, a grandson of the patient. [name] was agreeable and understanding of the plan to move to comfort care stating, "she wouldn't want to be in pain or to suffer anymore."

## 2020-01-17 NOTE — Progress Notes (Addendum)
[name], son and daughter, both called in attempt to coordinate visitation with [name], grandson. Primary nurse and charge nurse have both called [name] many times this evening with no answer.  I also confirmed [name]'s number with [name]. [name] and [name] have also attempted to get ahold of [name], and nobody is able to get ahold of him. [name] and [name]'s wishes are to not wait and proceed with extubation and continued comfort care measures.  Both [name] and [name] said to "Go ahead and do it." [name] said, "I'll take care of [name]."  I also consulted with Administrative Coordinator, [name], regarding the situation.  Her advice was to proceed per the son and daughter's wishes.  Will proceed with ordered extubation and continued comfort care measures.

## 2020-01-17 NOTE — Progress Notes (Signed)
Primary RN attempted to call [name] (grandson) again to coordinate visitation prior to extubation. No answer

## 2020-01-17 DEATH — deceased
